# Patient Record
Sex: Female | Born: 1935
Health system: Southern US, Community
[De-identification: ages and names within clinical notes are randomized; demographics above are authoritative.]

## PROBLEM LIST (undated history)

## (undated) DIAGNOSIS — T4145XA Adverse effect of unspecified anesthetic, initial encounter: Secondary | ICD-10-CM

## (undated) DIAGNOSIS — Z9981 Dependence on supplemental oxygen: Secondary | ICD-10-CM

## (undated) DIAGNOSIS — T8859XA Other complications of anesthesia, initial encounter: Secondary | ICD-10-CM

## (undated) DIAGNOSIS — E876 Hypokalemia: Secondary | ICD-10-CM

## (undated) DIAGNOSIS — R748 Abnormal levels of other serum enzymes: Secondary | ICD-10-CM

## (undated) DIAGNOSIS — Z9889 Other specified postprocedural states: Secondary | ICD-10-CM

## (undated) DIAGNOSIS — R112 Nausea with vomiting, unspecified: Secondary | ICD-10-CM

## (undated) DIAGNOSIS — K2270 Barrett's esophagus without dysplasia: Secondary | ICD-10-CM

## (undated) DIAGNOSIS — E785 Hyperlipidemia, unspecified: Secondary | ICD-10-CM

## (undated) DIAGNOSIS — C3490 Malignant neoplasm of unspecified part of unspecified bronchus or lung: Secondary | ICD-10-CM

## (undated) DIAGNOSIS — J449 Chronic obstructive pulmonary disease, unspecified: Secondary | ICD-10-CM

## (undated) DIAGNOSIS — H8110 Benign paroxysmal vertigo, unspecified ear: Secondary | ICD-10-CM

## (undated) DIAGNOSIS — I509 Heart failure, unspecified: Secondary | ICD-10-CM

## (undated) DIAGNOSIS — Z720 Tobacco use: Secondary | ICD-10-CM

## (undated) DIAGNOSIS — I8290 Acute embolism and thrombosis of unspecified vein: Secondary | ICD-10-CM

## (undated) DIAGNOSIS — I4891 Unspecified atrial fibrillation: Secondary | ICD-10-CM

## (undated) HISTORY — DX: Malignant neoplasm of unspecified part of unspecified bronchus or lung: C34.90

## (undated) HISTORY — PX: TIBIA FRACTURE SURGERY: SHX806

## (undated) HISTORY — DX: Benign paroxysmal vertigo, unspecified ear: H81.10

## (undated) HISTORY — PX: REVISION TOTAL HIP ARTHROPLASTY: SHX766

## (undated) HISTORY — DX: Heart failure, unspecified: I50.9

## (undated) HISTORY — DX: Tobacco use: Z72.0

## (undated) HISTORY — DX: Hypokalemia: E87.6

## (undated) HISTORY — DX: Hyperlipidemia, unspecified: E78.5

## (undated) HISTORY — PX: ABDOMINAL HYSTERECTOMY: SHX81

## (undated) HISTORY — DX: Chronic obstructive pulmonary disease, unspecified: J44.9

## (undated) HISTORY — DX: Dependence on supplemental oxygen: Z99.81

## (undated) HISTORY — DX: Barrett's esophagus without dysplasia: K22.70

## (undated) HISTORY — DX: Abnormal levels of other serum enzymes: R74.8

## (undated) HISTORY — PX: HEMORROIDECTOMY: SUR656

## (undated) HISTORY — DX: Acute embolism and thrombosis of unspecified vein: I82.90

## (undated) HISTORY — DX: Unspecified atrial fibrillation: I48.91

## (undated) HISTORY — PX: FOOT SURGERY: SHX648

---

## 1999-01-20 ENCOUNTER — Inpatient Hospital Stay (HOSPITAL_COMMUNITY): Admission: EM | Admit: 1999-01-20 | Discharge: 1999-01-26 | Payer: Self-pay | Admitting: Emergency Medicine

## 1999-01-21 ENCOUNTER — Encounter: Payer: Self-pay | Admitting: Orthopedic Surgery

## 1999-01-25 ENCOUNTER — Encounter: Payer: Self-pay | Admitting: Orthopedic Surgery

## 1999-01-26 ENCOUNTER — Encounter: Payer: Self-pay | Admitting: Orthopedic Surgery

## 1999-04-08 ENCOUNTER — Ambulatory Visit (HOSPITAL_COMMUNITY): Admission: RE | Admit: 1999-04-08 | Discharge: 1999-04-08 | Payer: Self-pay | Admitting: Orthopedic Surgery

## 1999-04-08 ENCOUNTER — Encounter: Payer: Self-pay | Admitting: Orthopedic Surgery

## 1999-04-23 ENCOUNTER — Encounter: Payer: Self-pay | Admitting: Orthopedic Surgery

## 1999-04-23 ENCOUNTER — Encounter: Admission: RE | Admit: 1999-04-23 | Discharge: 1999-04-23 | Payer: Self-pay | Admitting: Orthopedic Surgery

## 1999-04-24 ENCOUNTER — Ambulatory Visit (HOSPITAL_BASED_OUTPATIENT_CLINIC_OR_DEPARTMENT_OTHER): Admission: RE | Admit: 1999-04-24 | Discharge: 1999-04-24 | Payer: Self-pay | Admitting: Orthopedic Surgery

## 1999-10-14 ENCOUNTER — Ambulatory Visit (HOSPITAL_COMMUNITY): Admission: RE | Admit: 1999-10-14 | Discharge: 1999-10-14 | Payer: Self-pay | Admitting: Ophthalmology

## 1999-12-16 ENCOUNTER — Ambulatory Visit (HOSPITAL_COMMUNITY): Admission: RE | Admit: 1999-12-16 | Discharge: 1999-12-16 | Payer: Self-pay | Admitting: Ophthalmology

## 2000-09-15 ENCOUNTER — Encounter: Payer: Self-pay | Admitting: Orthopedic Surgery

## 2000-09-15 ENCOUNTER — Encounter: Admission: RE | Admit: 2000-09-15 | Discharge: 2000-09-15 | Payer: Self-pay | Admitting: Orthopedic Surgery

## 2000-09-29 ENCOUNTER — Encounter: Payer: Self-pay | Admitting: Orthopedic Surgery

## 2000-09-29 ENCOUNTER — Encounter: Admission: RE | Admit: 2000-09-29 | Discharge: 2000-09-29 | Payer: Self-pay | Admitting: Orthopedic Surgery

## 2001-01-27 ENCOUNTER — Encounter: Admission: RE | Admit: 2001-01-27 | Discharge: 2001-01-27 | Payer: Self-pay | Admitting: Neurosurgery

## 2001-01-27 ENCOUNTER — Encounter: Payer: Self-pay | Admitting: Neurosurgery

## 2001-02-16 ENCOUNTER — Encounter: Payer: Self-pay | Admitting: Neurosurgery

## 2001-02-21 ENCOUNTER — Encounter: Payer: Self-pay | Admitting: Neurosurgery

## 2001-02-21 ENCOUNTER — Ambulatory Visit (HOSPITAL_COMMUNITY): Admission: RE | Admit: 2001-02-21 | Discharge: 2001-02-21 | Payer: Self-pay | Admitting: Neurosurgery

## 2001-03-28 ENCOUNTER — Encounter: Payer: Self-pay | Admitting: Neurosurgery

## 2001-03-28 ENCOUNTER — Ambulatory Visit (HOSPITAL_COMMUNITY): Admission: RE | Admit: 2001-03-28 | Discharge: 2001-03-28 | Payer: Self-pay | Admitting: Neurosurgery

## 2001-03-28 ENCOUNTER — Other Ambulatory Visit: Admission: RE | Admit: 2001-03-28 | Discharge: 2001-03-28 | Payer: Self-pay | Admitting: Otolaryngology

## 2001-06-27 ENCOUNTER — Ambulatory Visit (HOSPITAL_COMMUNITY): Admission: RE | Admit: 2001-06-27 | Discharge: 2001-06-27 | Payer: Self-pay | Admitting: Neurosurgery

## 2001-06-27 ENCOUNTER — Encounter: Payer: Self-pay | Admitting: Neurosurgery

## 2001-10-06 ENCOUNTER — Encounter: Payer: Self-pay | Admitting: Neurosurgery

## 2001-10-06 ENCOUNTER — Encounter: Admission: RE | Admit: 2001-10-06 | Discharge: 2001-10-06 | Payer: Self-pay | Admitting: Neurosurgery

## 2001-12-22 ENCOUNTER — Encounter: Admission: RE | Admit: 2001-12-22 | Discharge: 2001-12-22 | Payer: Self-pay | Admitting: Orthopedic Surgery

## 2001-12-22 ENCOUNTER — Encounter: Payer: Self-pay | Admitting: Orthopedic Surgery

## 2002-01-06 ENCOUNTER — Encounter: Admission: RE | Admit: 2002-01-06 | Discharge: 2002-01-06 | Payer: Self-pay | Admitting: Orthopedic Surgery

## 2002-01-06 ENCOUNTER — Encounter: Payer: Self-pay | Admitting: Orthopedic Surgery

## 2002-01-20 ENCOUNTER — Encounter: Admission: RE | Admit: 2002-01-20 | Discharge: 2002-01-20 | Payer: Self-pay | Admitting: Orthopedic Surgery

## 2002-02-03 ENCOUNTER — Inpatient Hospital Stay (HOSPITAL_COMMUNITY): Admission: RE | Admit: 2002-02-03 | Discharge: 2002-02-07 | Payer: Self-pay | Admitting: Internal Medicine

## 2002-06-13 ENCOUNTER — Encounter: Payer: Self-pay | Admitting: Orthopedic Surgery

## 2002-06-13 ENCOUNTER — Encounter: Admission: RE | Admit: 2002-06-13 | Discharge: 2002-06-13 | Payer: Self-pay | Admitting: Orthopedic Surgery

## 2004-10-07 ENCOUNTER — Encounter: Admission: RE | Admit: 2004-10-07 | Discharge: 2004-10-07 | Payer: Self-pay | Admitting: Orthopedic Surgery

## 2004-10-09 ENCOUNTER — Ambulatory Visit (HOSPITAL_BASED_OUTPATIENT_CLINIC_OR_DEPARTMENT_OTHER): Admission: RE | Admit: 2004-10-09 | Discharge: 2004-10-09 | Payer: Self-pay | Admitting: Orthopedic Surgery

## 2004-10-09 ENCOUNTER — Ambulatory Visit (HOSPITAL_COMMUNITY): Admission: RE | Admit: 2004-10-09 | Discharge: 2004-10-09 | Payer: Self-pay | Admitting: Orthopedic Surgery

## 2005-11-22 ENCOUNTER — Emergency Department (HOSPITAL_COMMUNITY): Admission: EM | Admit: 2005-11-22 | Discharge: 2005-11-22 | Payer: Self-pay | Admitting: Emergency Medicine

## 2005-12-24 ENCOUNTER — Ambulatory Visit (HOSPITAL_COMMUNITY): Admission: RE | Admit: 2005-12-24 | Discharge: 2005-12-24 | Payer: Self-pay | Admitting: Specialist

## 2006-02-15 ENCOUNTER — Ambulatory Visit (HOSPITAL_COMMUNITY): Admission: RE | Admit: 2006-02-15 | Discharge: 2006-02-15 | Payer: Self-pay | Admitting: Thoracic Surgery

## 2006-02-15 ENCOUNTER — Encounter (INDEPENDENT_AMBULATORY_CARE_PROVIDER_SITE_OTHER): Payer: Self-pay | Admitting: Specialist

## 2006-02-22 ENCOUNTER — Inpatient Hospital Stay (HOSPITAL_COMMUNITY): Admission: RE | Admit: 2006-02-22 | Discharge: 2006-02-27 | Payer: Self-pay | Admitting: Thoracic Surgery

## 2006-02-22 ENCOUNTER — Encounter (INDEPENDENT_AMBULATORY_CARE_PROVIDER_SITE_OTHER): Payer: Self-pay | Admitting: Specialist

## 2006-02-23 HISTORY — PX: THORACOTOMY: SUR1349

## 2006-02-23 HISTORY — PX: LUNG LOBECTOMY: SHX167

## 2006-03-03 ENCOUNTER — Encounter: Admission: RE | Admit: 2006-03-03 | Discharge: 2006-03-03 | Payer: Self-pay | Admitting: Thoracic Surgery

## 2006-03-18 ENCOUNTER — Encounter: Admission: RE | Admit: 2006-03-18 | Discharge: 2006-03-18 | Payer: Self-pay | Admitting: Thoracic Surgery

## 2006-03-25 ENCOUNTER — Encounter: Admission: RE | Admit: 2006-03-25 | Discharge: 2006-03-25 | Payer: Self-pay | Admitting: Thoracic Surgery

## 2006-04-07 ENCOUNTER — Encounter: Admission: RE | Admit: 2006-04-07 | Discharge: 2006-04-07 | Payer: Self-pay | Admitting: Thoracic Surgery

## 2006-05-19 ENCOUNTER — Encounter: Admission: RE | Admit: 2006-05-19 | Discharge: 2006-05-19 | Payer: Self-pay | Admitting: Thoracic Surgery

## 2006-05-19 ENCOUNTER — Ambulatory Visit: Payer: Self-pay | Admitting: Thoracic Surgery

## 2006-07-14 ENCOUNTER — Inpatient Hospital Stay (HOSPITAL_COMMUNITY): Admission: AD | Admit: 2006-07-14 | Discharge: 2006-07-21 | Payer: Self-pay | Admitting: Orthopedic Surgery

## 2006-08-18 ENCOUNTER — Encounter: Admission: RE | Admit: 2006-08-18 | Discharge: 2006-08-18 | Payer: Self-pay | Admitting: Thoracic Surgery

## 2006-08-18 ENCOUNTER — Ambulatory Visit: Payer: Self-pay | Admitting: Thoracic Surgery

## 2006-10-16 ENCOUNTER — Encounter: Admission: RE | Admit: 2006-10-16 | Discharge: 2006-10-16 | Payer: Self-pay | Admitting: Neurosurgery

## 2006-11-25 ENCOUNTER — Encounter: Admission: RE | Admit: 2006-11-25 | Discharge: 2006-11-25 | Payer: Self-pay | Admitting: Thoracic Surgery

## 2006-11-25 ENCOUNTER — Ambulatory Visit: Payer: Self-pay | Admitting: Thoracic Surgery

## 2006-12-09 ENCOUNTER — Encounter: Admission: RE | Admit: 2006-12-09 | Discharge: 2006-12-09 | Payer: Self-pay | Admitting: Neurosurgery

## 2007-03-08 ENCOUNTER — Ambulatory Visit: Payer: Self-pay | Admitting: Vascular Surgery

## 2007-05-11 ENCOUNTER — Ambulatory Visit: Payer: Self-pay | Admitting: Thoracic Surgery

## 2007-05-11 ENCOUNTER — Encounter: Admission: RE | Admit: 2007-05-11 | Discharge: 2007-05-11 | Payer: Self-pay | Admitting: Thoracic Surgery

## 2007-09-09 IMAGING — CR DG CHEST 2V
2 series · 2 of 2 positions shown · non-contrast
Comparison: 03/25/06.

CLINICAL DATA: Status post lung surgery. 
TWO VIEW CHEST:

[w chest pa]
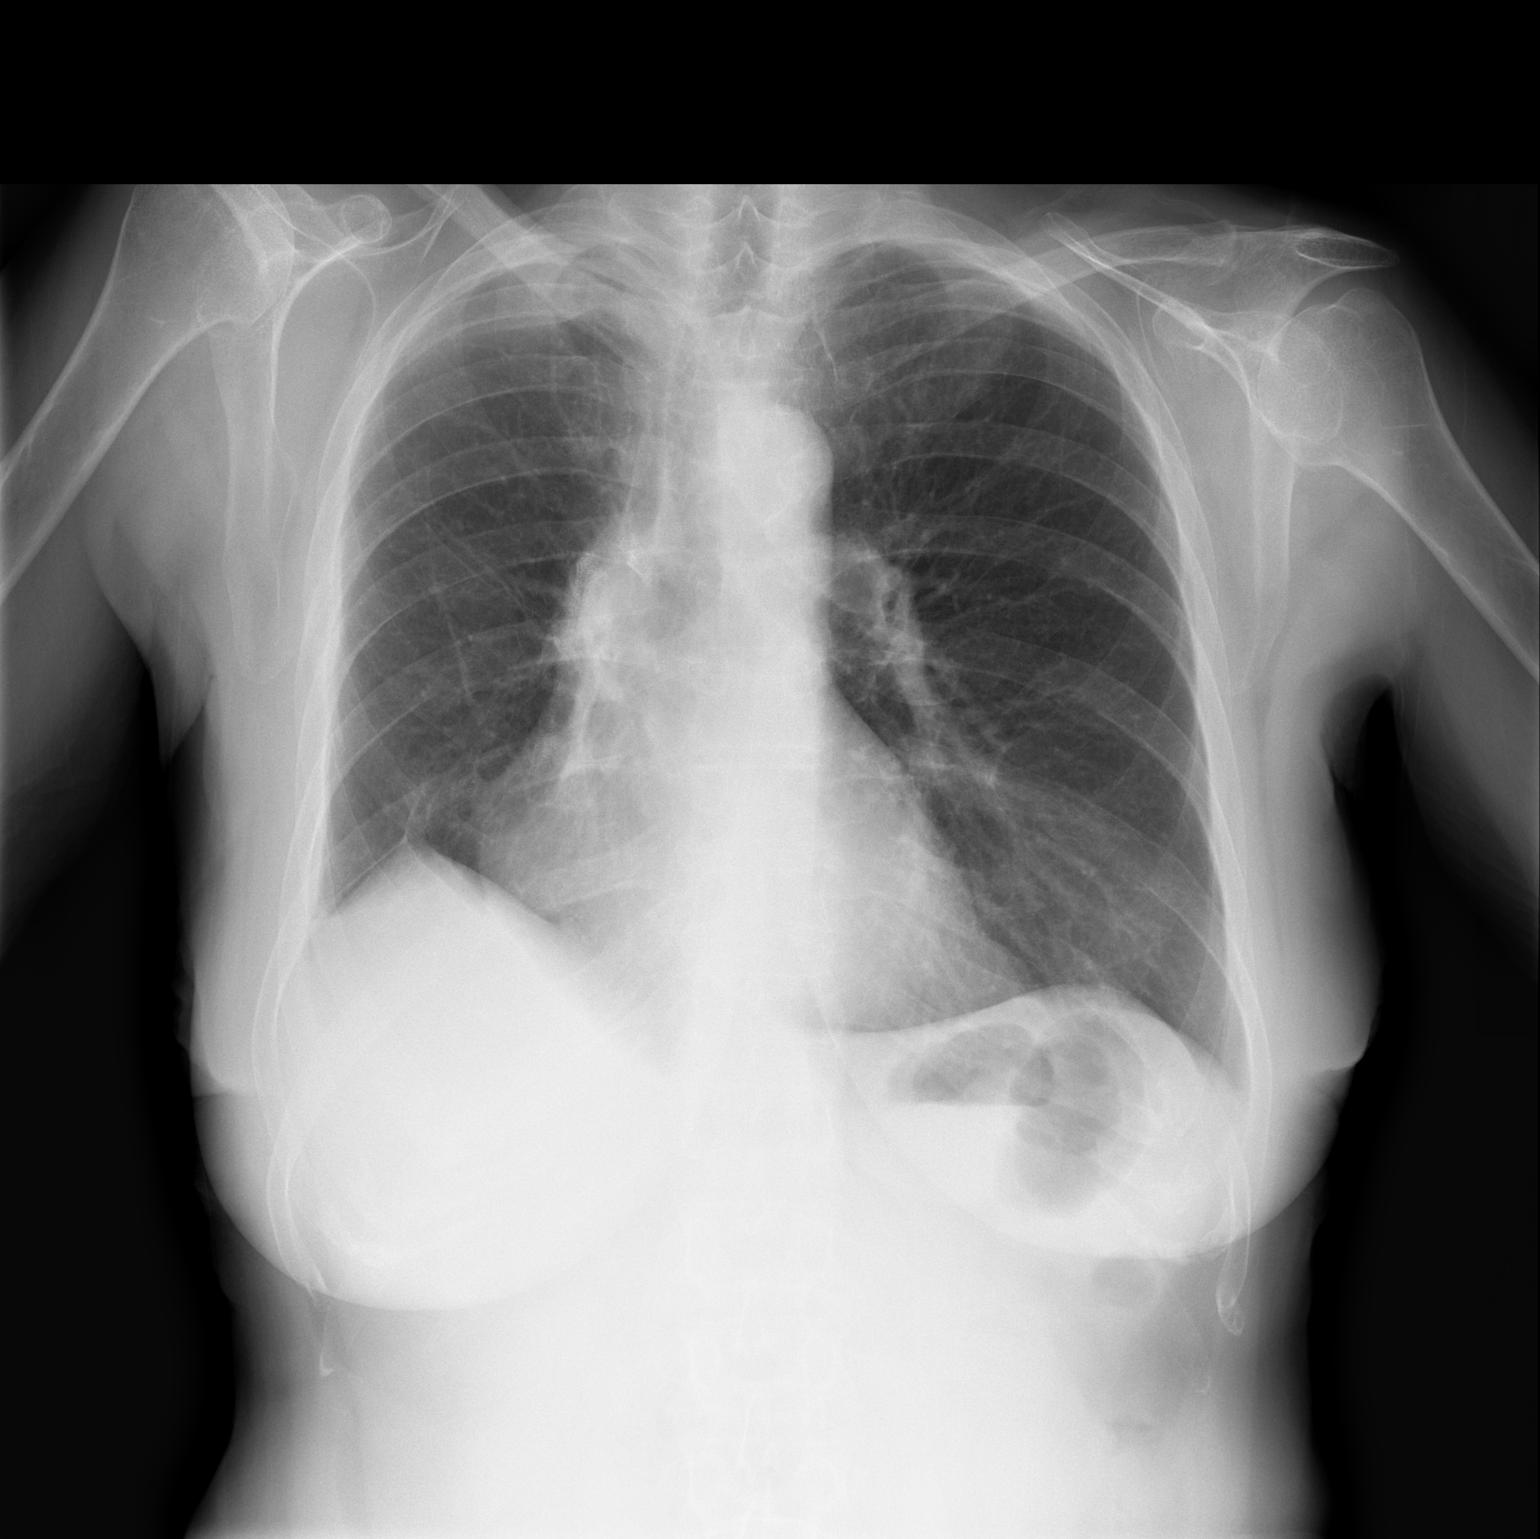

[w chest lat]
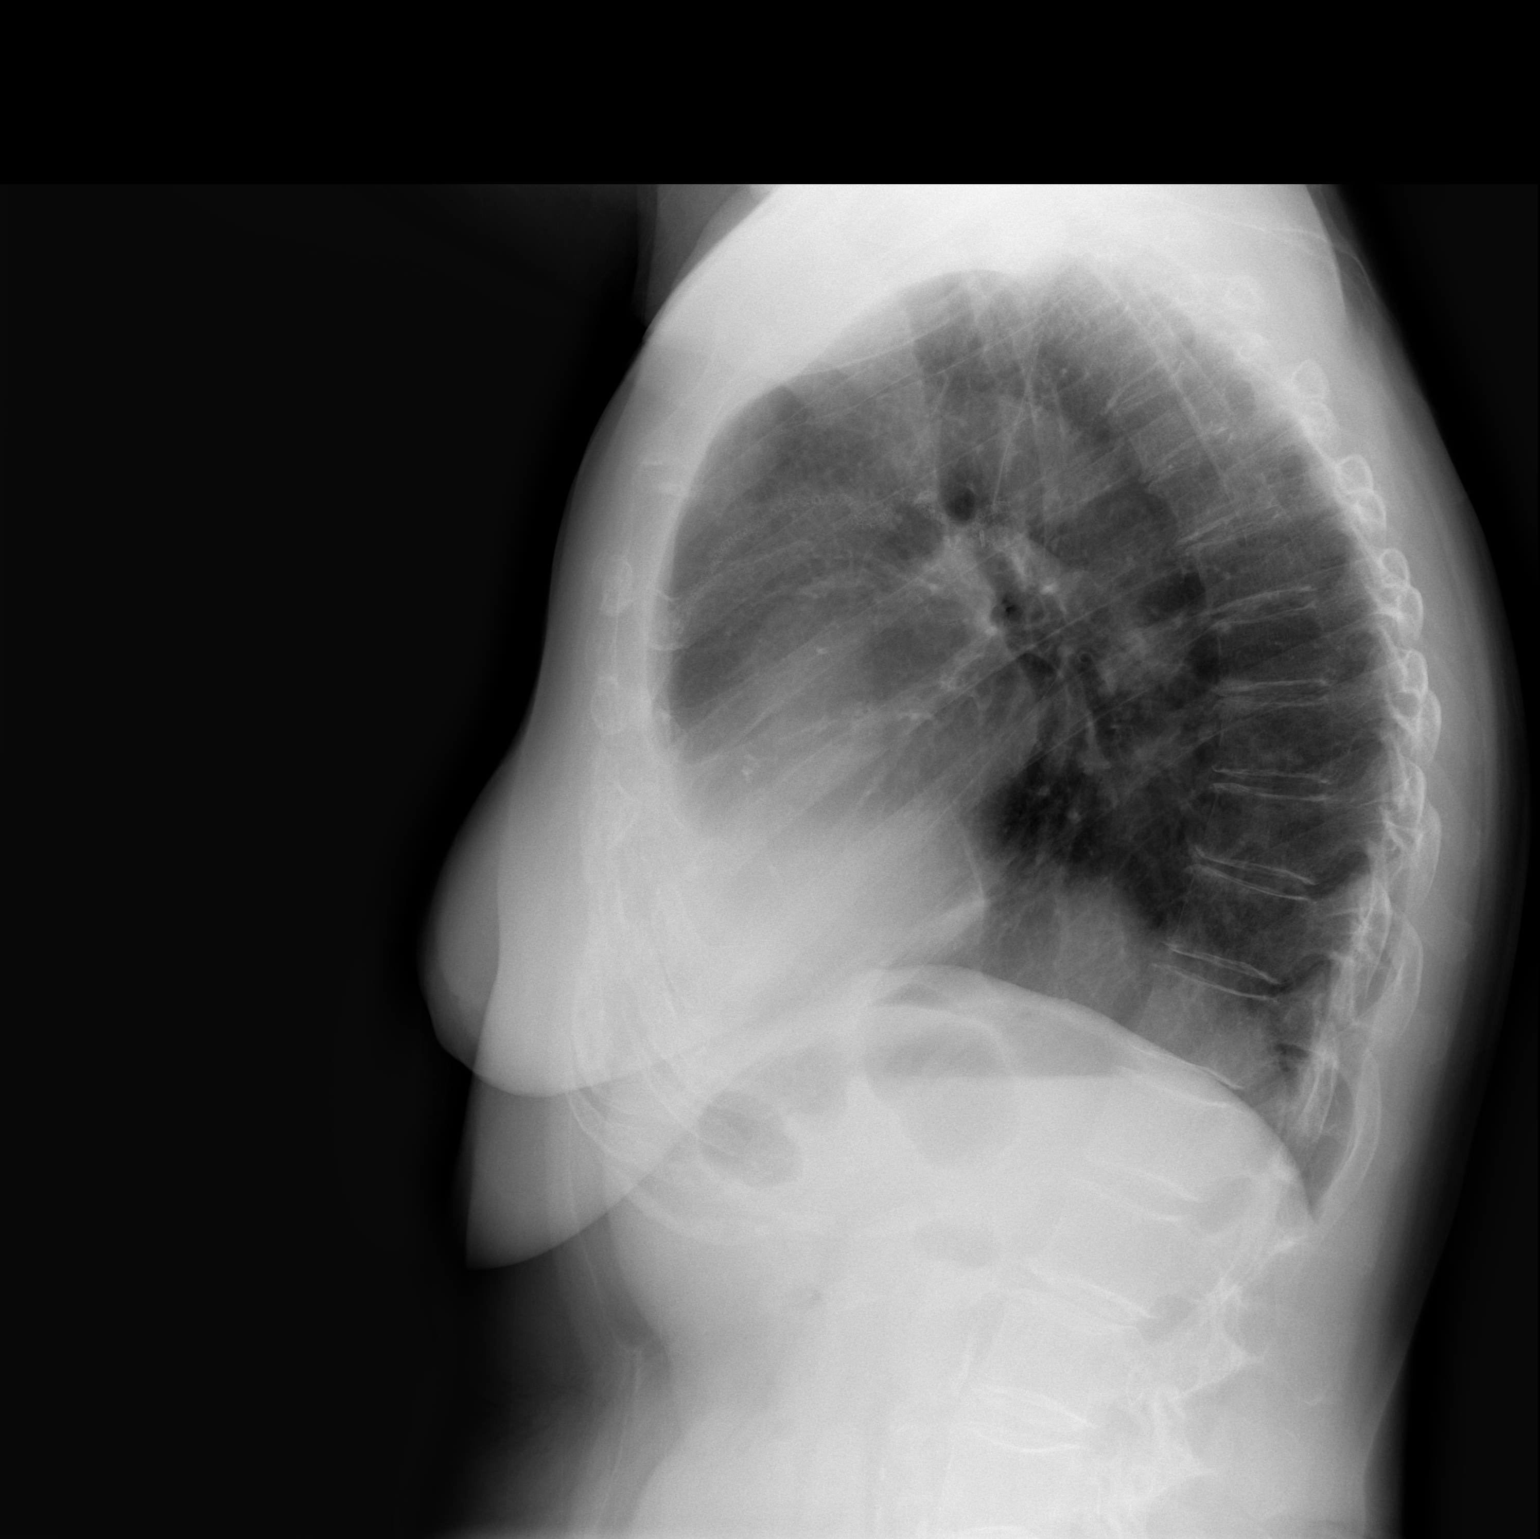

[2 of 2 positions shown; findings below may reference images not displayed]

FINDINGS: Trachea is midline.  Heart size is stable.  Post operative changes and volume loss are seen in the right hemithorax.  No definite pleural air.  Left lung clear.
IMPRESSION: Post operative changes in the right hemithorax without definite pleural air.

## 2007-11-02 ENCOUNTER — Ambulatory Visit: Payer: Self-pay | Admitting: Thoracic Surgery

## 2007-11-02 ENCOUNTER — Encounter: Admission: RE | Admit: 2007-11-02 | Discharge: 2007-11-02 | Payer: Self-pay | Admitting: Thoracic Surgery

## 2007-12-06 ENCOUNTER — Ambulatory Visit: Payer: Self-pay | Admitting: Vascular Surgery

## 2007-12-16 IMAGING — CR DG CHEST 2V
1 series · 1 of 1 positions shown · non-contrast
Comparison: 05/19/06.

CLINICAL DATA: Hip fracture.  
 CHEST - 2 VIEW:

[w chest lat]
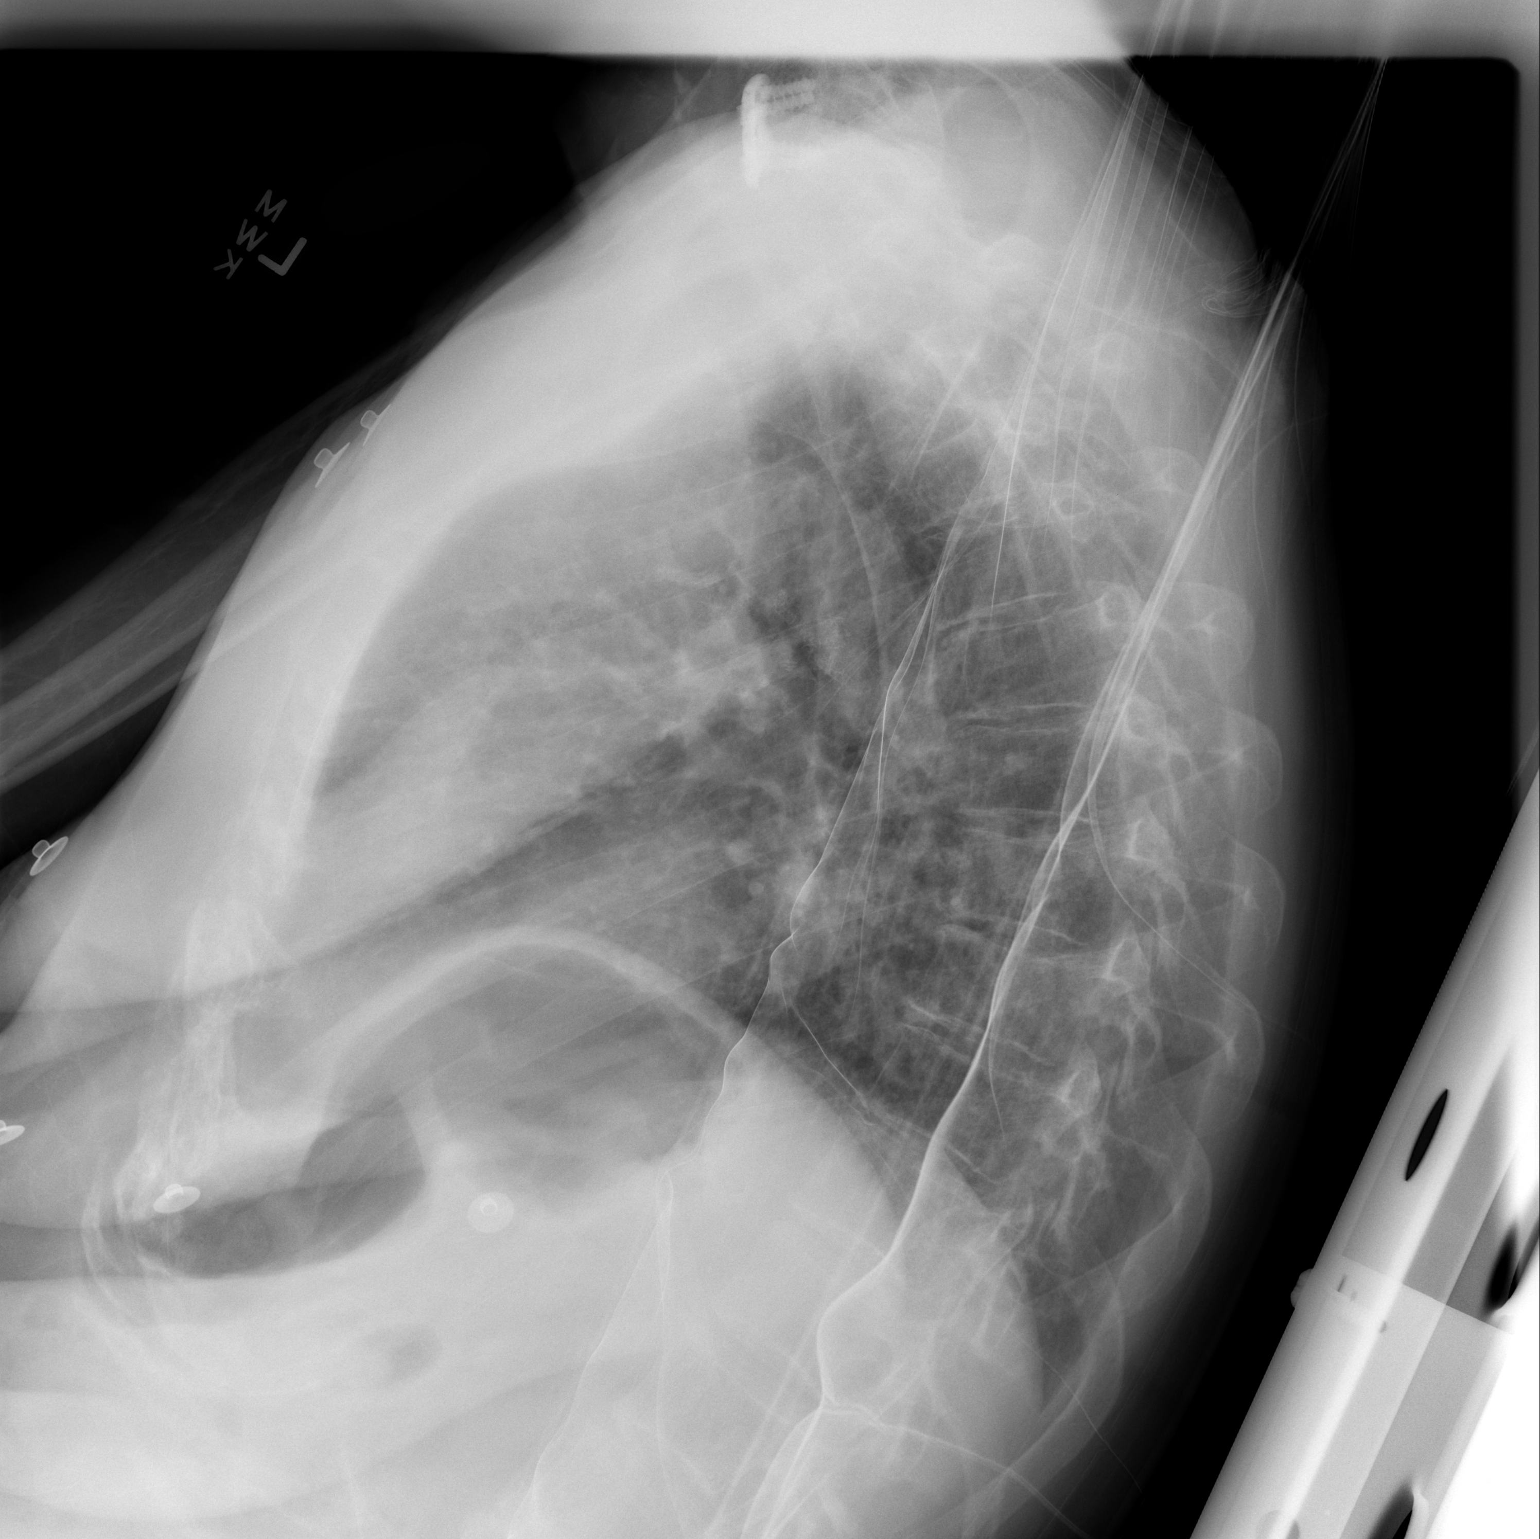

[1 of 1 positions shown; findings below may reference images not displayed]

FINDINGS: The heart size is stable.  The patient is rotated.  The questioned superimposition of shadows in the right mid lung zone is obscured on today?s study.  Left lung clear.
IMPRESSION: Please see above.

## 2008-05-02 ENCOUNTER — Ambulatory Visit: Payer: Self-pay | Admitting: Thoracic Surgery

## 2008-05-02 ENCOUNTER — Encounter: Admission: RE | Admit: 2008-05-02 | Discharge: 2008-05-02 | Payer: Self-pay | Admitting: Thoracic Surgery

## 2008-06-11 ENCOUNTER — Ambulatory Visit: Payer: Self-pay | Admitting: Vascular Surgery

## 2008-11-14 ENCOUNTER — Encounter: Admission: RE | Admit: 2008-11-14 | Discharge: 2008-11-14 | Payer: Self-pay | Admitting: Thoracic Surgery

## 2008-11-14 ENCOUNTER — Ambulatory Visit: Payer: Self-pay | Admitting: Thoracic Surgery

## 2008-12-27 ENCOUNTER — Encounter: Payer: Self-pay | Admitting: Gastroenterology

## 2008-12-31 ENCOUNTER — Ambulatory Visit: Payer: Self-pay | Admitting: Vascular Surgery

## 2009-01-07 ENCOUNTER — Encounter: Admission: RE | Admit: 2009-01-07 | Discharge: 2009-01-07 | Payer: Self-pay | Admitting: Orthopedic Surgery

## 2009-02-20 ENCOUNTER — Encounter: Payer: Self-pay | Admitting: Gastroenterology

## 2009-03-20 ENCOUNTER — Encounter: Payer: Self-pay | Admitting: Gastroenterology

## 2009-04-03 ENCOUNTER — Encounter: Payer: Self-pay | Admitting: Gastroenterology

## 2009-05-21 ENCOUNTER — Ambulatory Visit: Payer: Self-pay | Admitting: Thoracic Surgery

## 2009-05-21 ENCOUNTER — Encounter: Admission: RE | Admit: 2009-05-21 | Discharge: 2009-05-21 | Payer: Self-pay | Admitting: Thoracic Surgery

## 2009-08-03 ENCOUNTER — Encounter: Payer: Self-pay | Admitting: Gastroenterology

## 2009-08-05 ENCOUNTER — Encounter: Payer: Self-pay | Admitting: Gastroenterology

## 2009-08-07 ENCOUNTER — Encounter: Payer: Self-pay | Admitting: Gastroenterology

## 2009-08-20 ENCOUNTER — Encounter (INDEPENDENT_AMBULATORY_CARE_PROVIDER_SITE_OTHER): Payer: Self-pay | Admitting: *Deleted

## 2009-08-20 ENCOUNTER — Telehealth (INDEPENDENT_AMBULATORY_CARE_PROVIDER_SITE_OTHER): Payer: Self-pay | Admitting: *Deleted

## 2009-08-20 DIAGNOSIS — R933 Abnormal findings on diagnostic imaging of other parts of digestive tract: Secondary | ICD-10-CM | POA: Insufficient documentation

## 2009-09-11 ENCOUNTER — Telehealth (INDEPENDENT_AMBULATORY_CARE_PROVIDER_SITE_OTHER): Payer: Self-pay | Admitting: *Deleted

## 2009-09-11 ENCOUNTER — Emergency Department (HOSPITAL_COMMUNITY): Admission: EM | Admit: 2009-09-11 | Discharge: 2009-09-11 | Payer: Self-pay | Admitting: Emergency Medicine

## 2009-09-12 ENCOUNTER — Ambulatory Visit (HOSPITAL_COMMUNITY): Admission: RE | Admit: 2009-09-12 | Discharge: 2009-09-12 | Payer: Self-pay | Admitting: Gastroenterology

## 2009-09-12 ENCOUNTER — Ambulatory Visit: Payer: Self-pay | Admitting: Gastroenterology

## 2009-11-21 ENCOUNTER — Encounter: Admission: RE | Admit: 2009-11-21 | Discharge: 2009-11-21 | Payer: Self-pay | Admitting: Podiatry

## 2009-11-26 ENCOUNTER — Ambulatory Visit: Payer: Self-pay | Admitting: Thoracic Surgery

## 2009-11-26 ENCOUNTER — Encounter: Admission: RE | Admit: 2009-11-26 | Discharge: 2009-11-26 | Payer: Self-pay | Admitting: Thoracic Surgery

## 2010-03-18 ENCOUNTER — Ambulatory Visit: Admit: 2010-03-18 | Payer: Self-pay | Admitting: Vascular Surgery

## 2010-04-15 NOTE — Letter (Signed)
Summary: [facility]  [facility]   Imported By: Lester [place] 08/28/2009 10:14:17  _____________________________________________________________________  External Attachment:    Type:   Image     Comment:   External Document

## 2010-04-15 NOTE — Procedures (Signed)
Summary: EGD/[facility]  EGD/[facility]   Imported By: Lester [place] 08/28/2009 10:15:31  _____________________________________________________________________  External Attachment:    Type:   Image     Comment:   External Document

## 2010-04-15 NOTE — Letter (Signed)
Summary: EGD Instructions  [facility]  [address] [place], [place] [address]   Phone: [phone]  Fax: [phone]       Courtney Grant    27-Oct-1932    MRN: [id number]       Procedure Day /Date:09/12/09 Courtney Grant       Arrival Time:630 am     Procedure Time:830 am     Location of Procedure:                     X [facility] ( Outpatient Registration)    PREPARATION FOR ENDOSCOPY   On 09/12/09  THE DAY OF THE PROCEDURE:  1.   Nothing to eat or drink after midnight.                                                                                                  MEDICATION INSTRUCTIONS  Unless otherwise instructed, you should take regular prescription medications with a small sip of water as early as possible the morning of your procedure.               OTHER INSTRUCTIONS  You will need a responsible adult at least 75 years of age to accompany you and drive you home.   This person must remain in the waiting room during your procedure.  Wear loose fitting clothing that is easily removed.  Leave jewelry and other valuables at home.  However, you may wish to bring a book to read or an iPod/MP3 player to listen to music as you wait for your procedure to start.  Remove all body piercing jewelry and leave at home.  Total time from sign-in until discharge is approximately 2-3 hours.  You should go home directly after your procedure and rest.  You can resume normal activities the day after your procedure.  The day of your procedure you should not:   Drive   Make legal decisions   Operate machinery   Drink alcohol   Return to work  You will receive specific instructions about eating, activities and medications before you leave.    The above instructions have been reviewed and explained to me by   Chales Abrahams CMA (AAMA)  August 20, 2009 1:40 PM     I fully understand and can verbalize these instructions over the phone mailed to home  Date 08/20/09

## 2010-04-15 NOTE — Procedures (Signed)
Summary: EGD/[facility]  EGD/[facility]   Imported By: Lester [place] 08/28/2009 10:06:49  _____________________________________________________________________  External Attachment:    Type:   Image     Comment:   External Document

## 2010-04-15 NOTE — Progress Notes (Signed)
Summary: EUS  Phone Note Outgoing Call Call back at [facility] Phone [phone]   Call placed by: Chales Abrahams CMA Duncan Dull),  August 20, 2009 1:34 PM Summary of Call: pt scheduled for EUS need to review meds and instruct pt. Initial call taken by: Chales Abrahams CMA Duncan Dull),  August 20, 2009 1:37 PM  Follow-up for Phone Call        spoke with the pt she has been instructed and meds reviewed.  She will call with any questions Follow-up by: Chales Abrahams CMA Duncan Dull),  August 20, 2009 3:42 PM  New Problems: NONSPECIFIC ABN FINDING RAD & OTH EXAM GI TRACT (ICD-793.4)   New Problems: NONSPECIFIC ABN FINDING RAD & OTH EXAM GI TRACT (ICD-793.4)

## 2010-04-15 NOTE — Progress Notes (Signed)
Summary: FYI:  ER and EUS for 09/12/09  Phone Note Other Incoming   Caller: Lanora Manis from [facility] endo Summary of Call: Lanora Manis is calling because the pt came in today for her pre appt with anesthesia for her EUS tomorrow and was in extreme abd pain.  Lanora Manis took her to the ER.  Not sure if she will be admitted. Initial call taken by: Chales Abrahams CMA Duncan Dull),  September 11, 2009 2:45 PM  Follow-up for Phone Call        ok Follow-up by: Rachael Fee MD,  September 12, 2009 7:19 AM

## 2010-04-15 NOTE — Procedures (Signed)
Summary: Endoscopic Ultrasound  Patient: Courtney Grant Note: All result statuses are Final unless otherwise noted.  Tests: (1) Endoscopic Ultrasound (EUS)  EUS Endoscopic Ultrasound                             DONE     [facility]     [address] [place], [place]  [address]           ENDOSCOPIC ULTRASOUND PROCEDURE REPORT           PATIENT:  Courtney Grant, Courtney Grant  MR#:  [id number]     BIRTHDATE:  [birth year]  GENDER:  female     ENDOSCOPIST:  Rachael Fee, MD     REFERRED BY:  Webb Silversmith, M.D.     PROCEDURE DATE:  09/12/2009     PROCEDURE:  Upper EUS     ASA CLASS:  Class II     INDICATIONS:  abdominal pain; slightly dilated CBD and main     pancreatic duct on recent CT scan, also ?soft tissue mass at     ampulla; normal CBC, normal lfts; recent EGD by Dr. Charm Barges was     normal; lung cancer resected 3-4 years ago (still smokes)     MEDICATIONS:   MAC sedation, administered by CRNA           DESCRIPTION OF PROCEDURE:   After the risks, benefits, and     alternatives of the procedure were thoroughly explained, informed     consent was obtained.  The  endoscope was introduced through the     mouth and advanced to the duodenum.           <<PROCEDUREIMAGES>>           Endoscopic findings:     1. Normal esophagus     2. Medium to large amount of solid food in stomach without     anatomic gastric outlet obstruction     3. Normal duodenum (periampullary mucosa was normal, visualized     with duodenoscope)           EUS findings:     1. CBD was slightly dilated (up to 8-57mm proximally), but tapered     smoothly into head of pancreas and contained no stones.     2. Main pancreatic duct was slightly dilated (up to 4-40mm in neck     of gland) and somewhat ectatic.     3. No pancreatic masses or signs of chronic pancreatitis     4. No peripancreatic adenopathy.     5. Normal gallbladder.     6. Limited views of liver, spleen, portal and splenic vessels were     all  normal           Impression:     Slightly dilated CBD, main pancreatic duct without pancreatic     masses or signs of chronic pancreatitis.  The clinical relevence     of these findings is not clear but given normal CBC, normal LFTs     it is unlikely the dilated ducts are causing symptoms.  She did     have moderate to large amount of retained food in stomach,     probably she has underlying gastroparesis.   She was evaluated by     one of my partners in ER yesterday afternoon (was here as     pre-visit for this procedure and complained  of severe abdominal     pain, sent to ER) CBC, cmet, amylase, lipase were all normal. She     was given 15 percocet.  She asked for more pain meds today prior     to this procedure which I am not comfortable giving to her since I     have no explanation for her abdominal pains.  She was instructed     to call her PCP or Dr. Charm Barges (was considering workup for     intestinal ischemia).           ______________________________     Rachael Fee, MD           n.     eSIGNED:   Rachael Fee at 09/12/2009 09:09 AM           Elliot Dally, [id number]  Note: An exclamation mark (!) indicates a result that was not dispersed into the flowsheet. Document Creation Date: 09/12/2009 9:10 AM _______________________________________________________________________  (1) Order result status: Final Collection or observation date-time: 09/12/2009 08:57 Requested date-time:  Receipt date-time:  Reported date-time:  Referring Physician:   Ordering Physician: Rob Bunting [phone]) Specimen Source:  Source: Launa Grill Order Number: [id number] Lab site:

## 2010-04-15 NOTE — Letter (Signed)
Summary: [facility]  [facility]   Imported By: Lester [place] 08/28/2009 10:16:56  _____________________________________________________________________  External Attachment:    Type:   Image     Comment:   External Document

## 2010-05-08 ENCOUNTER — Other Ambulatory Visit: Payer: Self-pay | Admitting: Thoracic Surgery

## 2010-05-08 DIAGNOSIS — R911 Solitary pulmonary nodule: Secondary | ICD-10-CM

## 2010-05-26 ENCOUNTER — Other Ambulatory Visit: Payer: Self-pay | Admitting: Orthopedic Surgery

## 2010-05-26 DIAGNOSIS — R52 Pain, unspecified: Secondary | ICD-10-CM

## 2010-05-26 DIAGNOSIS — R531 Weakness: Secondary | ICD-10-CM

## 2010-05-30 ENCOUNTER — Other Ambulatory Visit: Payer: Self-pay

## 2010-06-02 LAB — DIGOXIN LEVEL: Digoxin Level: 1 ng/mL (ref 0.8–2.0)

## 2010-06-02 LAB — DIFFERENTIAL
Basophils Absolute: 0 10*3/uL (ref 0.0–0.1)
Basophils Relative: 0 % (ref 0–1)
Eosinophils Absolute: 0.2 10*3/uL (ref 0.0–0.7)
Eosinophils Relative: 2 % (ref 0–5)
Lymphocytes Relative: 28 % (ref 12–46)
Lymphs Abs: 2.7 10*3/uL (ref 0.7–4.0)
Monocytes Absolute: 0.7 10*3/uL (ref 0.1–1.0)
Monocytes Relative: 7 % (ref 3–12)
Neutro Abs: 6.1 10*3/uL (ref 1.7–7.7)
Neutrophils Relative %: 63 % (ref 43–77)

## 2010-06-02 LAB — CBC
HCT: 39.7 % (ref 36.0–46.0)
Hemoglobin: 13.6 g/dL (ref 12.0–15.0)
MCH: 33.1 pg (ref 26.0–34.0)
MCHC: 34.2 g/dL (ref 30.0–36.0)
MCV: 97 fL (ref 78.0–100.0)
Platelets: 286 10*3/uL (ref 150–400)
RBC: 4.1 MIL/uL (ref 3.87–5.11)
RDW: 13.2 % (ref 11.5–15.5)
WBC: 9.7 10*3/uL (ref 4.0–10.5)

## 2010-06-02 LAB — COMPREHENSIVE METABOLIC PANEL
ALT: 13 U/L (ref 0–35)
AST: 23 U/L (ref 0–37)
Albumin: 3.7 g/dL (ref 3.5–5.2)
Alkaline Phosphatase: 80 U/L (ref 39–117)
BUN: 16 mg/dL (ref 6–23)
CO2: 27 mEq/L (ref 19–32)
Calcium: 9.1 mg/dL (ref 8.4–10.5)
Chloride: 100 mEq/L (ref 96–112)
Creatinine, Ser: 0.88 mg/dL (ref 0.4–1.2)
GFR calc non Af Amer: >60 mL/min (ref >60)
Glucose, Bld: 98 mg/dL (ref 70–99)
Potassium: 3.5 mEq/L (ref 3.5–5.1)
Sodium: 137 mEq/L (ref 135–145)
Total Bilirubin: 0.2 mg/dL — ABNORMAL LOW (ref 0.3–1.2)
Total Protein: 7.3 g/dL (ref 6.0–8.3)

## 2010-06-02 LAB — LIPASE, BLOOD: Lipase: 39 U/L (ref 11–59)

## 2010-06-02 LAB — POCT CARDIAC MARKERS
CKMB, poc: 1.7 ng/mL (ref 1.0–8.0)
Myoglobin, poc: 54.1 ng/mL (ref 12–200)
Troponin i, poc: <0.05 ng/mL (ref 0.00–0.09)

## 2010-06-02 LAB — D-DIMER, QUANTITATIVE

## 2010-06-03 ENCOUNTER — Ambulatory Visit: Payer: Self-pay | Admitting: Thoracic Surgery

## 2010-06-03 ENCOUNTER — Ambulatory Visit: Payer: Medicare Other | Admitting: Thoracic Surgery

## 2010-06-03 ENCOUNTER — Ambulatory Visit
Admission: RE | Admit: 2010-06-03 | Discharge: 2010-06-03 | Disposition: A | Discharge status: Home / Self Care | Payer: Medicare Other | Source: Ambulatory Visit | Attending: Thoracic Surgery | Admitting: Thoracic Surgery

## 2010-06-03 DIAGNOSIS — R911 Solitary pulmonary nodule: Secondary | ICD-10-CM

## 2010-06-06 ENCOUNTER — Ambulatory Visit (INDEPENDENT_AMBULATORY_CARE_PROVIDER_SITE_OTHER): Payer: Medicare Other | Admitting: Thoracic Surgery

## 2010-06-06 DIAGNOSIS — C3490 Malignant neoplasm of unspecified part of unspecified bronchus or lung: Secondary | ICD-10-CM

## 2010-06-07 NOTE — Assessment & Plan Note (Signed)
OFFICE VISIT  MORGANNA, STYLES DOB:  11-10-1932                                        June 06, 2010 CHART #:  [id number]  The patient returned today and her CT scan showed no evidence of recurrence.  She is 4-1/2 years since her surgery.  Blood pressure is 113/68, pulse 96, respirations 20, sats were 98%.  If she is still having lot of chronic back and hip pain, will see Dr. Wynetta Emery for this. She wanted some Percocet, we told her to check with her medical doctor. I will see her back again in 6 months with a CT scan for final check.  Ines Bloomer, M.D. Electronically Signed  DPB/MEDQ  D:  06/06/2010  T:  06/07/2010  Job:  [id number]

## 2010-07-29 NOTE — Procedures (Signed)
RENAL ARTERY DUPLEX EVALUATION   INDICATION:  Followup left renal artery stenosis per CT.   HISTORY:  Diabetes:  No.  Cardiac:  Irregular heartbeat.  Hypertension:  No.  Smoking:  Yes.   RENAL ARTERY DUPLEX FINDINGS:  Aorta-Proximal:  76 cm/s  Aorta-Mid:  95 cm/s  Aorta-Distal:  108 cm/s  Celiac Artery Origin:  110 cm/s  SMA Origin:  121 cm/s                                    RIGHT               LEFT  Renal Artery Origin:                                 279 cm/s  Renal Artery Proximal:                               258 cm/s  Renal Artery Mid:                                    155 cm/s  Renal Artery Distal:                                 111 cm/s  Hilar Acceleration Time (AT):  Renal-Aortic Ratio (RAR):                            3.6  Kidney Size:                                         10.1 cm X  cm  End Diastolic Ratio (EDR):  Resistive Index (RI):                                0.64   IMPRESSION:  1. Limited study.  2. > 60% stenosis noted in the left renal artery.  Left kidney      measured 10.1 cm.  3. Resistive index appears within normal limits.   ___________________________________________  Quita Skye Hart [place], M.D.   MG/MEDQ  D:  03/08/2007  T:  03/09/2007  Job:  [id number]

## 2010-07-29 NOTE — Assessment & Plan Note (Signed)
OFFICE VISIT   Courtney Grant, Courtney Grant  DOB:  05-31-1932                                        November 26, 2009  CHART #:  [id number]   The patient returned today.  Her blood pressure is 134/67, pulse 72,  respirations 18, sats are 97%.  She is having lot of trouble with her  bones and was unable to tolerate Fosamax.  She is having a lot of pain,  particularly in her left foot where she had a stress fracture.  Chest x-  ray showed no evidence of recurrence.  She is still smoking, was  cautioned not to do this, but otherwise no evidence of recurrence of  cancer.  We will see her back again in 6 months with a CT scan.   Ines Bloomer, M.D.  Electronically Signed   DPB/MEDQ  D:  11/26/2009  T:  11/27/2009  Job:  [id number]

## 2010-07-29 NOTE — Assessment & Plan Note (Signed)
OFFICE VISIT   Courtney Grant, Courtney Grant  DOB:  [birth year]                                        November 02, 2007  CHART #:  [id number]   The patient came for followup today.  She was doing reasonably well.  Her blood pressure was 115/63, pulse 64, respirations 18, and sats were  98%.  Chest x-ray did show normal postoperative changes.  I will plan to  see her back again in 6 months to repeat his CT scan at that time.   Ines Bloomer, M.D.  Electronically Signed   DPB/MEDQ  D:  11/02/2007  T:  11/02/2007  Job:  [id number]

## 2010-07-29 NOTE — Assessment & Plan Note (Signed)
OFFICE VISIT   [name], Courtney Grant  DOB:  Dec 01, 1932                                        May 02, 2008  CHART #:  [id number]   The patient comes today, now 2 years since her surgery.  A CT scan  showed no evidence of recurrence.  Her blood pressure was 112/60, pulse  60, respirations 18, and sats were 98%.  I plan to see her back again in  6 months with a chest x-ray.   Ines Bloomer, M.D.  Electronically Signed   DPB/MEDQ  D:  05/02/2008  T:  05/02/2008  Job:  [id number]

## 2010-07-29 NOTE — Letter (Signed)
November 14, 2008   Feliciana Rossetti, MD  [address]  [place], [place] [address]   Re:  DANALY, BARI                DOB:  04-15-1932   Dear Dr. Shary Decamp:   I saw the patient back today.  Her chest x-ray was stable.  She is now  over 2-1/2 years since her surgery with no evidence of recurrence of her  cancer.  She apparently is having a lot of left-sided abdominal pain and  she says an MRI was done recently at [facility], but is not in  the system.  From my standpoint, she is stable, unless something shows  up in the recent MRI and I will plan to see her again in 6 months with a  CT scan of the chest for further follow up of her lung cancer.   Sincerely,   Ines Bloomer, M.D.  Electronically Signed   DPB/MEDQ  D:  11/14/2008  T:  11/15/2008  Job:  [id number]

## 2010-07-29 NOTE — Procedures (Signed)
RENAL ARTERY DUPLEX EVALUATION   INDICATION:  Follow-up evaluation of left renal artery stenosis.   HISTORY:  Diabetes:  No.  Cardiac:  Arrhythmia.  Hypertension:  No.  Smoking:  Yes.   RENAL ARTERY DUPLEX FINDINGS:  Aorta-Proximal:  59 cm/s  Aorta-Mid:  67 cm/s  Aorta-Distal:  97 cm/s  Celiac Artery Origin:  106 cm/s  SMA Origin:  132 cm/s                                    RIGHT               LEFT  Renal Artery Origin:             102/31 cm/s         189/50 cm/s  Renal Artery Proximal:           182/48 cm/s         231/61 cm/s  Renal Artery Mid:                179/47 cm/s         171/41 cm/s  Renal Artery Distal:             179/40 cm/s         104/37 cm/s  Hilar Acceleration Time (AT):  Renal-Aortic Ratio (RAR):        3.20                3.91  Kidney Size:                     11.6 cm X 4.12 cm   10.3 cm X 5.5 cm  End Diastolic Ratio (EDR):       0.25 to 0.23        0.24 to 0.30  Resistive Index (RI):            0.78                0.76   IMPRESSION:  1. Kidneys are normal with respect to size and shape bilaterally.  2. Resistive indices and end-diastolic ratio suggests no significant      parenchymal disease bilaterally.  3. Right renal to aortic ratio suggests 40 to 60% right renal artery      stenosis.  4. Left renal to aortic ratio suggests >60% left renal artery      stenosis.  5. No significant change from previous study, performed on 12/06/07.   ___________________________________________  Quita Skye. Hart [place], M.D.   MC/MEDQ  D:  06/11/2008  T:  06/11/2008  Job:  [id number]

## 2010-07-29 NOTE — Letter (Signed)
May 21, 2009   Feliciana Rossetti, MD  [address]  [place], [place] [address]   Re:  Courtney Grant, Courtney Grant                DOB:  Jan 10, 1933   Dear Dr. Shary Decamp:   I saw the patient back today.  Her CT scan now 3 years since her surgery  shows no evidence of recurrence of her cancer.  She has gone back to  smoking and I counseled her again to stop smoking.  Her blood pressure  is 131/71, pulse 74, respirations 18, sats were 98%.  I will see her  back again in 6 months with a chest x-ray and then another CT scan in 1  year.   Ines Bloomer, M.D.  Electronically Signed   DPB/MEDQ  D:  05/21/2009  T:  05/22/2009  Job:  [id number]

## 2010-07-29 NOTE — Consult Note (Signed)
VASCULAR SURGERY CONSULTATION   Courtney Grant, Courtney Grant  DOB:  1932-12-03                                       03/08/2007  XLKGM#:[id number]   REFERRING PHYSICIAN:  Donalee Citrin, M.D.   HISTORY:  Courtney Grant is a 77 year old female referred for consultation  by Dr. Wynetta Emery and Dr. Shary Decamp regarding possible left renal artery  stenosis.  This 77 year old female has a history of multiple orthopedic  problems as well as a right lung cancer which was treated by Dr. Jovita Gamma and was recently found by CT angiogram to have plaque at the  origin of her left renal artery.  She was referred for consideration for  treatment.  She denies any history of hypertension or renal  insufficiency and her most recent blood work in July of 2008 revealed a  creatinine of 0.6 and BUN of 8.  She does complain of back discomfort  which has been a problem for her recently.   PAST MEDICAL HISTORY:  Her past medical history is negative for  diabetes, hypertension, coronary artery disease, COPD or stroke.  She  does have hyperlipidemia, fibromyalgia, neuropathy and a history of lung  cancer.   PAST SURGICAL HISTORY:  Includes a left hip replacement, right femur  pinning, right ankle surgery, right lung resection, hysterectomy.   FAMILY HISTORY:  Negative for coronary artery disease, diabetes and  stroke.   SOCIAL HISTORY:  She is married and has 1 children.  She is retired.  She smokes about 4-5 cigarettes per day, having cut down dramatically  after her lung cancer surgery.  She does not use alcohol.   REVIEW OF SYSTEMS AND MEDICATIONS:  Please see health history exam.   ALLERGIES:  IBUPROFEN, ASPIRIN, CODEINE.   PHYSICAL EXAMINATION:  Blood pressure is 139/65, heart rate 63,  respirations are 20.  General:  She is alert and oriented x3.  Her neck  is supple.  Three plus carotid pulses palpable.  No bruits are audible.  Her neurologic exam is normal.  No palpable adenopathy in the neck.  Upper extremity pulses 3+ bilaterally.  Chest clear to auscultation.  Cardiovascular exam reveals regular rhythm.  No murmurs.  Her abdomen is  soft, nontender with no palpable masses.  No bruits are audible.  Lower  extremity pulses are 3+ to femoral, popliteal and dorsalis pedis level.   I reviewed her CT angiogram and agree that she does have a significant  plaque at the origin of her left renal artery.  Both kidneys are well  perfused.  She also has diffuse plaque in her iliac system.  We  performed a renal duplex scan in the office today and it appears that  her stenosis in the origin of the left renal artery is approximately 60-  70%.   There is no indication to treat this lesion in the origin of the left  renal artery with the absence of hypertension and renal insufficiency,  particularly since it appears to only be a moderate stenosis.  We will  follow this in the office with a periodic renal scanning.  She will  return in 9 months to have this performed.   Courtney Grant, M.D.  Electronically Signed  JDL/MEDQ  D:  03/08/2007  T:  03/09/2007  Job:  [id number]   cc:   Donalee Citrin, M.D.  Courtney Rossetti,  MD

## 2010-07-29 NOTE — Assessment & Plan Note (Signed)
OFFICE VISIT   Courtney Grant, Courtney Grant  DOB:  15-Aug-1932                                        November 25, 2006  CHART #:  [id number]   Blood pressure was 115/60, pulse 49, respirations 18, saturation 97%.  Lungs were clear to auscultation and percussion.  Heart:  Regular sinus  rhythm, although there was bradycardia.   Chest x-ray showed no postoperative changes, no evidence of recurrence  of her cancer.   Plan to repeat her CT scan in about 4 to 5 months which will be a little  over a year from her first surgery.   Ines Bloomer, M.D.  Electronically Signed   DPB/MEDQ  D:  11/25/2006  T:  11/26/2006  Job:  [id number]

## 2010-07-29 NOTE — Procedures (Signed)
RENAL ARTERY DUPLEX EVALUATION   INDICATION:  Follow up left renal artery stenosis.   HISTORY:  Diabetes:  No.  Cardiac:  Arrhythmia.  Hypertension:  No.  Smoking:  Yes.   RENAL ARTERY DUPLEX FINDINGS:  Aorta-Proximal:  54 cm/s  Aorta-Mid:  49 cm/s  Aorta-Distal:  85 cm/s  Celiac Artery Origin:  127 cm/s  SMA Origin:  101 cm/s                                    RIGHT               LEFT  Renal Artery Origin:             104 cm/s            208 cm/s  Renal Artery Proximal:           110 cm/s            131 cm/s  Renal Artery Mid:                119 cm/s            118 cm/s  Renal Artery Distal:             98 cm/s             68 cm/s  Hilar Acceleration Time (AT):  Renal-Aortic Ratio (RAR):        2.4                 4.2  Kidney Size:                     11.3                10.2 cm  End Diastolic Ratio (EDR):  Resistive Index (RI):            0.69                0.76   IMPRESSION:  1. The renal aortic ratios suggest a >60% stenosis of the left renal      artery origin and no significant stenosis of the right renal      artery.  2. The bilateral kidney length measurements are within normal limits;      however, the left kidney measures less than the right kidney.  3. The intrarenal resistive indices are normal on the right and      abnormal on the left.  4. No significant change in the left renal artery noted when compared      to the previous examination on 06/11/08 with the right renal artery      velocities appearing less than previously recorded.       ___________________________________________  Quita Skye Hart [place], M.D.   CH/MEDQ  D:  12/31/2008  T:  12/31/2008  Job:  [id number]

## 2010-07-29 NOTE — Assessment & Plan Note (Signed)
OFFICE VISIT   SIDDHI, DORNBUSH  DOB:                                                    August 18, 2004  CHART #:  [id number]   Since the patient saw [place] last time, she fell and broke her left hip.  It  was repaired by Dr. August Saucer.  Chest x-ray is stable.  Shows normal  postoperative changes.   EXAM:  Her lungs are clear to auscultation and percussion.  Blood pressure 112/55, pulse 66, respirations 18, sats are 95%.   She is now 7 months since we did a right upper lobectomy on her.  Plan  to see her back again in 3 months with a chest x-ray and then we will  order a CT scan at that time.   Ines Bloomer, M.D.    DPB/MEDQ  D:  08/18/2006  T:  08/18/2006  Job:  [id number]

## 2010-07-29 NOTE — Assessment & Plan Note (Signed)
OFFICE VISIT   Courtney Grant, Courtney Grant  DOB:                                                    August 18, 2004  CHART #:  [id number]   Since the patient saw [place] last time, she fell and broke her left hip.  It  was repaired by Dr. August Saucer.  Chest x-ray is stable.  Shows normal  postoperative changes.   EXAM:  Her lungs are clear to auscultation and percussion.  Blood pressure 112/55, pulse 66, respirations 18, sats are 95%.   She is now 7 months since we did a right upper lobectomy on her.  Plan  to see her back again in 3 months with a chest x-ray and then we will  order a CT scan at that time.   Ines Bloomer, M.D.  Electronically Signed   DPB/MEDQ  D:  08/18/2006  T:  08/18/2006  Job:  [id number]

## 2010-07-29 NOTE — Procedures (Signed)
RENAL ARTERY DUPLEX EVALUATION   INDICATION:  Follow up left renal artery stenosis.   HISTORY:  Diabetes:  No.  Cardiac:  Irregular heartbeat.  Hypertension:  No.  Smoking:  Yes.   RENAL ARTERY DUPLEX FINDINGS:  Aorta-Proximal:  85 cm/s  Aorta-Mid:  57 cm/s  Aorta-Distal:  64 cm/s  Celiac Artery Origin:  117/23 cm/s  SMA Origin:  116/5 cm/s                                    RIGHT               LEFT  Renal Artery Origin:             158/30 cm/s         251/57 cm/s  Renal Artery Proximal:           141/43 cm/s         293/63 cm/s  Renal Artery Mid:                132/34 cm/s         194/25 cm/s  Renal Artery Distal:             115/31 cm/s         113/25 cm/s  Hilar Acceleration Time (AT):    0.058 sec           0.039 sec  Renal-Aortic Ratio (RAR):        1.86                3.5  Kidney Size:                     11.7 cm X 3.64 cm   10.1 cm X 4.47 cm  End Diastolic Ratio (EDR):  Resistive Index (RI):            0.71                0.71   IMPRESSION:  1. Right renal artery shows evidence of <60% stenosis.  2. Left renal artery shows evidence of >60% stenosis, showing no      significant change from previous study.  3. Bilateral kidneys appear within normal limits in regard to shape      and size.  4. Resistive index is within normal limits bilaterally.   ___________________________________________  Quita Skye Hart [place], M.D.   AS/MEDQ  D:  12/06/2007  T:  12/06/2007  Job:  [id number]   cc:   Feliciana Rossetti, MD

## 2010-07-29 NOTE — Letter (Signed)
May 11, 2007   Dr. Eual Fines A. Chodri  [address]  [place], [place]  [address]   Re:  Courtney Grant, Courtney Grant                DOB:  08-Jun-1932   Dr. Blenda Nicely:   It was pleasure to see the patient.  At the time we saw her 2 weeks  after surgery a CT scan showed no evidence of recurrence.  She is doing  well overall.  I will plan to see her in 6 months with a chest x-ray.  Her blood pressure was117/67, pulse 68, respirations 18, sats 98%.  Lungs clear to auscultation and percussion.   Courtney Grant, M.D.  Electronically Signed   DPB/MEDQ  D:  05/11/2007  T:  05/12/2007  Job:  [id number]

## 2010-08-01 NOTE — H&P (Signed)
NAMEMarland Kitchen  Courtney Grant, Courtney Grant NO.:  [id number]   MEDICAL RECORD NO.:  [id number]          PATIENT TYPE:  INP   LOCATION:  NA                           FACILITY:  [facility]   PHYSICIAN:  Ines Bloomer, M.D. DATE OF BIRTH:  [birth year]   DATE OF ADMISSION:  02/15/2006  DATE OF DISCHARGE:  02/15/2006                              HISTORY & PHYSICAL   CHIEF COMPLAINT:  Right upper lobe lesion.   HISTORY OF PRESENT ILLNESS:  This 77 year old patient has been followed  for over a year with a right upper lobe lesion with a ground glass  appearance.  A PET scan was negative, but the lesion has increased in  size from 1.5 x 1.1.  It was previously 1.5 x 0.8.  Pulmonary function  tests revealed an FVC of 1.97, FEV-1 of 1.4.  She had a bronchoscopy,  which was negative, although there were some atypical cells on lavage.  She has had some previous left chest pain secondary to a fractured rib.  She has had no hemoptysis, fevers, chills, excessive sputum.  She gets  shortness of breath with exertion.  Still continues to smoke.  We went  ahead and got a needle biopsy on this and it showed bronchoalveolar  cancer.   FAMILY HISTORY:  Noncontributory.   SOCIAL HISTORY:  She has not quit smoking.  Does not drink alcohol on a  regular basis.  Is married and retired.   REVIEW OF SYSTEMS:  She is 130 pounds.  She is 5 feet 3 inches.  CARDIAC:  She has no angina or atrial fibrillation.  PULMONARY:  See  history of present illness.  GI:  She has chronic constipation.  GU:  No  dysuria or frequency or frequent urination.  No kidney disease.  VASCULAR:  She has a history of DVT.  NEUROLOGICAL:  No headaches,  blackouts or seizures.  ORTHOPEDIC:  She has probable arthritis, joint  pain.  PSYCHIATRIC:  She has been treated for nervousness.  ENT:  She  has decrease in her eyes and recently no change in her hearing.  HEMATOLOGICAL:  No problems with clotting disorders or bleeding.   ALLERGIES:   ASPIRIN, IBUPROFEN AND CODEINE CAUSES GI UPSET.   MEDICATIONS:  Include:  1. Lopressor 50 mg a day.  2. Coumadin.  3. Diovan 3 mg twice a day.  4. She takes Percocet p.r.n.  5. Metamucil p.r.n.  6. Diprivan 5 mg at night.   PHYSICAL EXAMINATION:  GENERAL:  She is a thin, Caucasian female in no  acute distress.  VITAL SIGNS:  Blood pressure is 110/64.  Pulse of 58.  Respirations 18.  Saturations was 97%.  HEENT:  Head is atraumatic.  Eyes:  Pupils equal and reactive to light  and accommodation.  Extraocular movements are normal.  Ears:  Tympanic membranes intact.  Nose:  There is no septal deviation.  Mouth:  Without lesions.  NECK:  Supple without thyromegaly, as well as supraclavicular, axillary  or adenopathy.  CHEST:  Clear to auscultation and percussion.  HEART:  Regular sinus rhythm.  No murmur.  ABDOMEN:  Soft.  There is no hepatosplenomegaly.  EXTREMITIES:  Pulses are 2+.  There is no clubbing or edema.  NEUROLOGIC:  She is oriented x3.  Sensory and motor are intact.  Cranial  nerves II-XII are intact.  SKIN:  Without lesions.   IMPRESSION:  1. Bronchoalveolar cancer, right upper lobe.  2. History of deep venous thrombosis.  3. Tobacco abuse.  4. Chronic obstructive pulmonary disease.   PLAN:  Right VATS, possible right upper lobectomy versus right superior  segment lobectomy.           ______________________________  Ines Bloomer, M.D.     DPB/MEDQ  D:  02/20/2006  T:  02/21/2006  Job:  [id number]

## 2010-08-01 NOTE — Op Note (Signed)
. [facility]  Patient:    Courtney Grant, Courtney Grant Visit Number: [id number] MRN: [id number]          Service Type: DSU Location: 3000 [id number] Attending Physician:  Mariam Dollar Dictated by:   Garlon Hatchet., M.D. Proc. Date: 02/21/01 Admit Date:  02/21/2001                             Operative Report  PREOPERATIVE DIAGNOSIS:  C6 radiculopathy on the left and cervical spondylosis at C5-6.  PROCEDURE:  Anterior cervical diskectomy and fusion at C5-6 using a 6 mm patellar wedge allograft and a 21 mm Atlantis plate, four 13 mm variable-angled screws.  SURGEON:  Garlon Hatchet., M.D.  ASSISTANT:  Reinaldo Meeker, M.D.  ANESTHESIA:  General endotracheal.  CLINICAL NOTE:  The patient is a very pleasant 77 year old female with long-standing neck pain radiating down her left arm with numbness, tingling in the thumb and forefinger.  The patient failed conservative treatment with anti-inflammatories and physical therapy and pain got progressively worse. Preoperative imaging showed severe cervical spondylosis at C5-6 with compression on the left C6 nerve root.  The patient was extensively counseled of the risks and benefits of anterior cervical diskectomy and fusion and decided to proceed forward.  DESCRIPTION OF PROCEDURE:  The patient was brought in the OR, was induced under general anesthesia.  The right side of the neck was prepped and draped in the usual sterile fashion.  Preoperative x-ray localized a needle over the C5-6 disk space.  A curvilinear incision was made just off the midline to the anterior border of the sternocleidomastoid at this site.  The superficial layer of the platysma was dissected out and divided longitudinally.  The avascular plane between the sternocleidomastoid and the omohyoid and strap muscle was developed down to the prevertebral fascia.  The prevertebral fascia was dissected away with Kitners.  The longus colli was reflected  laterally. Intraoperative x-ray confirmed localization of a needle at the C5-6 disk space, and the longus colli was then further retracted laterally.  The self-retaining retractor was placed.  Annulotomy was made with an 11 blade scalpel.  Pituitary rongeurs were used to remove the anterior margin of the annulus.  There was noted to be a large anterior osteophyte that was bitten off with a Leksell rongeur.  Then the operating microscope was draped and brought into the field and under microscopic illumination, the remainder of the disk space was drilled down with a high-speed drill down to posterior osteophytes and then using a 1 and 2 mm Kerrison punch, the osteophytes coming off the C6 vertebral body and C5 vertebral body were underbitten and the posterior longitudinal ligament was noted to be severely hypertrophied.  This was removed in piecemeal fashion and the thecal sac was visualized and explored out to the C6 neural foramen on the left.  There was a lot of ligamentous hypertrophy and evidence of what looked like epidural steroid injection and severe spondylitic disease compressing the left C6 nerve root. This was radically decompressed, and the C6 nerve root on the left was completely decompressed a significant way out the distal foramen.  Then attention was taken to decompression of the thecal sac out to the right side, and the proximal aspect of the right C6 nerve root was decompressed and explored with angled nerve hook as well as the left side and noted to have no further compression.  The  end plates were then scraped and prepared for arthrodesis.  A 6 mm patellar wedge was sized, selected, and inserted approximately 1 mm deep to the anterior vertebral body line.  Then using a 21 mm Atlantis plate that was drilled, tapped, and four 13 mm variable-angled screws were inserted.  Excellent purchase in all screws, and set screws were tightened.  The wound was copiously irrigated, and  meticulous hemostasis was maintained.  The platysma was reapproximated with a 3-0 interrupted Vicryl, and the skin was closed with a running 4-0 subcuticular.  Benzoin and Steri-Strips were applied.  The patient went to the recovery room in stable condition.  At the end of the case, all needle counts and sponge counts correct.  Postop x-ray confirmed good localization of the plate and screws and bone graft, and the patient went to the recovery room in stable condition. Dictated by:   Garlon Hatchet., M.D. Attending Physician:  Mariam Dollar DD:  02/21/01 TD:  02/21/01 Job: [id number]

## 2010-08-01 NOTE — Op Note (Signed)
NAME:  Courtney Grant, Courtney Grant                ACCOUNT NO.:  [id number]   MEDICAL RECORD NO.:  [id number]          PATIENT TYPE:  AMB   LOCATION:  DSC                          FACILITY:  [facility]   PHYSICIAN:  Nadara Mustard, MD     DATE OF BIRTH:  1932/09/08   DATE OF PROCEDURE:  10/09/2004  DATE OF DISCHARGE:                                 OPERATIVE REPORT   PREOPERATIVE DIAGNOSIS:  Clawing of the toes, left foot, with prominent  second and third metatarsal heads on the left second and third toes, with a  bunion the fifth toe.   POSTOPERATIVE DIAGNOSIS:  Clawing of the toes, left foot, with prominent  second and third metatarsal heads on the left second and third toes, with a  bunion the fifth toe.   PROCEDURES:  1.  Weil osteotomy of the left second and third metatarsals.  2.  A chevron osteotomy of the left fifth metatarsal.   SURGEON.:  Nadara Mustard, MD   ANESTHESIA:  General.   ESTIMATED BLOOD LOSS:  Minimal.   ANTIBIOTICS:  1 gram of Kefzol.   DRAINS:  None.   COMPLICATIONS:  None.   TOURNIQUET TIME:  None.   DISPOSITION:  To PACU in stable condition.   INDICATION FOR PROCEDURE:  The patient is a 77 year old woman with painful  calluses beneath the second and third metatarsal heads with painful clawing  of the second and third toes, pain with shoe wear, as well as pain from the  fifth bunion.  The patient has failed conservative care and presents at this  time for surgical intervention.  The risks and benefits were discussed  including infection, neurovascular injury, persistent pain, nonhealing of  the wounds, need for additional surgery.  The patient states she understands  and wishes to proceed at this time.   PROCEDURE:  The patient was brought to OR room 5 and underwent a general  anesthetic.  After an adequate level of anesthesia obtained, the patient's  left lower extremity was prepped using DuraPrep and draped into a sterile  field.  An incision was made dorsally  between the second and third  metatarsals and the second web space.  Blunt dissection was carried down to  the MTP joint of the second toe.  A Weil osteotomy was performed and the  metatarsal head was translated proximally, and this was stabilized with a 2  x 12 mm mini-fragment cortical screw.  The wound was irrigated.  Hemostasis  was obtained.  Attention was then focused on the third metatarsal.  Again a  Weil osteotomy was performed on the third metatarsal.  This was stabilized with a 2 x 12 mm cortical mini-fragment screw.  The  wound was irrigated and the incision was closed using a 3-0 nylon with a far-  near, near-far suture.  Attention was then focused on the fifth metatarsal  head.  A lateral incision was made, and this was carried down to the  retinaculum.  The retinaculum was retracted.  An ostectomy was first performed, followed  by a chevron osteotomy.  The metatarsal head  was translated medially approximately 3 mm.  This was  stabilized with a 0.045 K-wire and a second ostectomy was then again  performed.  Hemostasis was obtained.  The wound was irrigated with normal  saline.  The capsule was closed using running 2-0 Vicryl.  The skin was  closed using interrupted 3-0 nylon.  The wounds were covered with Adaptic  orthopedic sponges, sterile Webril and a Coban dressing.  The patient was  extubated, taken to the PACU in stable condition.  Plan for touchdown  weightbearing on the left.  The patient has a walker at home.  She has a  prescription for Vicodin.  Follow up in office in two weeks.  Change the  dressing in 3 days.       MVD/MEDQ  D:  10/09/2004  T:  10/10/2004  Job:  [id number]

## 2010-08-01 NOTE — Op Note (Signed)
NAMEMarland Grant  RAZIA, SCREWS NO.:  [id number]   MEDICAL RECORD NO.:  [id number]          PATIENT TYPE:  INP   LOCATION:  3302                         FACILITY:  [facility]   PHYSICIAN:  Ines Bloomer, M.D. DATE OF BIRTH:  13-Apr-1932   DATE OF PROCEDURE:  DATE OF DISCHARGE:                               OPERATIVE REPORT   PREOPERATIVE DIAGNOSIS:  Right upper lobe mass.   POSTOPERATIVE DIAGNOSIS:  Adenocarcinoma of the right upper lobe.   OPERATION PERFORMED:  1. Right video-assisted thoracoscopic surgery.  2. Right thoracotomy.  3. Right upper lobectomy with no dissection.   SURGEON:  Ines Bloomer, M.D.   ANESTHESIA:  General.   PROCEDURE:  After percutaneous insertion of all monitor lines, the  patient underwent general anesthesia, was prepped and draped in the  usual sterile manner, and turned to the right lateral thoracotomy  position.  Two trocar sites were made in the anterior and posterior  axilla at the seventh intercostal space.  Two trocars were inserted and  the right upper lobe was identified.  No intrapleural spread was seen.  A posterolateral thoracotomy was made over the fifth intercostal space,  partially dividing the latissimus, reflecting the serratus anteriorly.  Fifth intercostal space was entered and 2-K was placed in the space.  Dissection was started posterior dissecting out some 10-R nodes and  thickening of the bronchus in the takeoff of the right upper lobe  bronchus.  Then we went to the fissure and dissected out the artery and  the posterior branch to the right upper lobe and ligated it proximally  with 2-0 silk, clipping it, and dividing the superior portion of the  fissure with the EZ-45 stapler.  Attention was then turned to the apical  and posterior branches, which were actually divided.  There was an  apical branch and an anterior branch and these were both dissected out  and divided with the Autosuture 30 white Roticulator,  resecting out  several 10-R nodes.  The superior pulmonary vein was divided.  This  exposed the bronchus which was resected and divided with the Autosuture  staple.  The minor fissure was divided with the EZ-45 stapler and the  Autosuture stapler.  The right upper lobe was removed.  The right middle  lobe was sewn to the right lung lobe to prevent torsion with 2-0 silk.  Inferior pulmonary ligaments were taken down, four drill holes were  placed in the sixth rib, and two chest tubes were placed to the trocar  site and tied and laced with 0 silk.  A Marcaine block was done in the  usual fashion.  A single On-Q catheter was inserted subpleurally in the  usual fashion.  Then the chest was closed with 4-0 pericostal, #1 Vicryl  in the musculature, 2-0 Vicryl in the subcutaneous tissue, and 3-0  Vicryl to the subcuticular tissue.  The patient was returned to the  recovery room in stable condition.           ______________________________  Ines Bloomer, M.D.    DPB/MEDQ  D:  02/22/2006  T:  02/23/2006  Job:  [id number]

## 2010-08-01 NOTE — Op Note (Signed)
[place]  Patient:    JOYCELYN, Courtney Grant                       MRN: [id number] Proc. Date: 12/16/99 Adm. Date:  [id number] Disc. Date: [id number] Attending:  Ivor Messier                           Operative Report  PREOPERATIVE DIAGNOSIS:  Posterior subcapsular cataract right eye.  POSTOPERATIVE DIAGNOSIS:  Posterior subcapsular cataract right eye.  OPERATION:  Planned extracapsular cataract extraction, phacoemulsification, primary insertion of posterior chamber intraocular lens implant.  SURGEON:  Guadelupe Sabin, M.D.  ASSISTANT:  Nurse.  ANESTHESIA:  Local 4% Xylocaine, 0.75% Marcaine.  Anesthesia standby required. The patient was considerably anxious during the preoperative period and complained of claustrophobia in the early operation.  She, however, calmed down with sedation.  DESCRIPTION OF PROCEDURE:  After the patient was prepped and draped, the speculum was inserted in the right eye.  The eye was turned downward and a superior rectus traction suture placed.  Schiotz tonometry was recorded as 6 scale units with a 5.5 gm weight.  A peritomy was performed adjacent to the limbus from the 11 to 1 oclock position.  The corneoscleral junction was cleaned and a corneoscleral groove made with a 45 degree Superblade.  The anterior chamber was then entered with the diamond 2.5 mm keratome at the 12 oclock position and a 15 degrees blade at the 2:30 position.  Using a bent 26 gauge needle on a Healon syringe, a circular capsulorrhexis was begun and completed with the Graebow forceps.  Hydrodissection and hydrodelineation were performed using 1% Xylocaine.  The 30 degree phacoemulsification tip was then inserted with slow control of emulsification of the lens nucleus.  Total ultrasonic time was 1 minute and 1 second.  Average power level is 13%.  Total amount of fluid used during the emulsification 50 cc.  Following removal of the  nucleus, the residual cortex was aspirated with the irrigation-aspiration tip.  The posterior capsular appeared intact with a brilliant red fundus reflex.  It was therefore elected to insert an [employer]er[employer] medical optics [id number] silicone posterior chamber three-piece intraocular lens implant with UV absorber, diopter strength +20.50.  This was inserted with the [employer] forceps into the anterior chamber and then centered into the capsular bag using the [facility] lens rotator.  The lens appeared to be well centered.  The Healon, which had been used during the procedure, was then aspirated and replaced with Miochol ophthalmic solution and a balance salt solution.  Pupil constricted concentrically.  The operative incisions appeared to be self-sealing and no sutures were required.  Maxitrol ointment was instilled in the conjunctival cul-de-sac and a light patch and protector shield applied.  Duration of the procedure and anesthesia administration was 45 minutes.  The patient left the operating room for the recovery room in good condition. DD:  12/16/99 TD:  12/16/99 Job: [id number]

## 2010-08-01 NOTE — Consult Note (Signed)
NAMEMarland Grant  Courtney, Grant NO.:  [id number]   MEDICAL RECORD NO.:  [id number]          PATIENT TYPE:  INP   LOCATION:  5029                         FACILITY:  [facility]   PHYSICIAN:  Lonia Blood, M.D.       DATE OF BIRTH:  [birth year]   DATE OF CONSULTATION:  07/14/2006  DATE OF DISCHARGE:                                 CONSULTATION   REFERRING PHYSICIAN:  Dr. Dorene Grebe, orthopedics.   REASON FOR CONSULTATION:  Management of anticoagulation and preop  evaluation.   HISTORY OF PRESENT ILLNESS:  [name] is a 77 year old woman who got  up on a table to clean some light fixtures and fell from the table.  In  the process, she hurt her left lower extremity and had severe left hip  pain when she got up.  She was brought to the emergency room in [facility]  and was found to have left hip fracture.  She was transferred to [facility] for open reduction, internal fixation.   PAST MEDICAL HISTORY:  1. Deep venous thrombosis in November2003.  2. Pulmonary embolus in 2000.  3. Right upper lobectomy for bronchoalveolar carcinoma in      December2007.  The cancer was found to be stage I.  4. Osteoarthritis.  5. Osteoporosis.  6. Status post hysterectomy.  7. Status post splenectomy.  8. Fibromyalgia.  9. Irritable bowel syndrome.  10.Cataract removal.   HOME MEDICATIONS:  1. Xanax twice a day.  2. Percocet 2 tablets every 4 hours.  3. Digitek 0.125 mg every other day alternating with 0.25 mg every      other day.  4. Metoprolol 75 mg twice a day.  5. Coumadin 3 mg daily.   SOCIAL HISTORY:  She is married and [place]  She is retired.  She still smokes a few cigarettes  every day.  She does not drink alcohol.   FAMILY HISTORY:  Positive for lung cancer.   REVIEW OF SYMPTOMS:  HEENT:  Occasional ophthalmology and history of  cataracts.  Ears:  No hearing deficit.  HEART:  Has history of  palpitations and maybe  atrial fibrillation at times.  She denies chest  pain.  Denies shortness of breath.  LUNGS:  No dyspnea.  No cough.  ABDOMEN:  No abdominal pain currently but she usually has chronic  abdominal pain.  MUSCULOSKELETAL:  Pain in all the joints that the  patient can think of.  Other systems as per HPI, all other systems are  negative.   PHYSICAL EXAMINATION UPON ADMISSION:  VITAL SIGNS:  Shows a temperature  of 100.9, heart rate 93, respiratory rate 18, blood pressure 116/59,  saturation 95% on room air.  GENERAL APPEARANCE:  A frail elderly woman in no acute distress, alert,  oriented to place, person and time.  HEENT:  Head:  Normocephalic, atraumatic.  Eyes:  Pupils equal, round,  react to light and accommodation.  Extraocular movement were intact.  Sclerae anicteric.  Conjunctivae pink.  Throat clear.  NECK:  Supple.  No JVD.  No carotid bruits.  CHEST:  Clear to auscultation bilaterally.  HEART:  Regular rate and rhythm without murmurs, rubs, or gallops.  ABDOMEN:  The patient's abdomen is soft and nontender.  Bowel sounds are  present.  LOWER EXTREMITIES:  Have no edema.  There is diminished range of motion  in the left lower extremity.  SKIN:  Warm and dry without any suspicious rashes.   LABORATORY VALUES:  EKG shows normal sinus rhythm and some nonspecific  ST-T changes.  PT is 33.7, INR 3.4.  Albumin 3.6.  Digoxin 1.86.  Sodium  134, potassium 4.2, chloride 98, bicarb 24, BUN 13, creatinine 0.7,  glucose 131.  White blood cell count 14, hemoglobin 11, platelet count  229.   IMPRESSION AND RECOMMENDATIONS:  1. Left hip fracture.  Obviously the patient requires open reduction,      internal fixation.  She is at intermediate risk for cardiovascular      complications given her age, tobacco abuse, and history of lung      cancer.  She does have a few things in her favor, number 1 being      that she has never had a heart attack and that she does not have a      strong family  history for coronary artery disease.  At this point      in time, we should proceed with the surgical option and monitor the      patient carefully postoperatively.  We will leave the reversal of      the anticoagulation to the primary team.  Since the patient is      tolerating the beta blockers fairly well, I  will continue them for      now.  2. Fever and mild leukocytosis.  I do suspect this is secondary to      fracture, but in case it remains persistent, we will check from      other causes like a possible urinary tract infection or other      cause.  For now,  I will not recommend an antibiotic.  3. Chronic obstructive pulmonary disease and tobacco abuse.  The      patient has been counseled.  Started on nebulizers and incentive      spirometry.  4. Anemia.  I do suspect this is secondary to acute blood loss from      the fracture.  I will keep a close eye on the CBC and transfuse as      needed if the patient drops further.  The reversal of the      anticoagulation should help too.  5. History of deep venous thrombosis and pulmonary embolism.  The      patient is to resume Coumadin postop.      Lonia Blood, M.D.  Electronically Signed     SL/MEDQ  D:  07/14/2006  T:  07/15/2006  Job:  [id number]   cc:   G. Dorene Grebe, M.D.

## 2010-08-01 NOTE — H&P (Signed)
NAME:  Courtney Grant NO.:  [id number]   MEDICAL RECORD NO.:  [id number]          PATIENT TYPE:  INP   LOCATION:  5029                         FACILITY:  [facility]   PHYSICIAN:  Burnard Bunting, M.D.    DATE OF BIRTH:  1932/08/03   DATE OF ADMISSION:  07/14/2006  DATE OF DISCHARGE:                              HISTORY & PHYSICAL   CHIEF COMPLAINT:  Left hip pain.   HISTORY OF PRESENT ILLNESS:  Courtney Grant is a 77 year old ambulatory  female who fell last night without loss of consciousness.  Her husband  was not sure if anything was wrong with her, but this morning when she  was not able to walk or stand he called 9-1-1.  She denies any other  orthopedic complaints.  She denies any chest pain or shortness of  breath.   PAST MEDICAL HISTORY:  Notable for:  1. DVT.  2. Pulmonary embolism.  3. Osteoporotic compression fractures.  4. History of tibial fracture status post IM nail in 2005 with      subsequent DVT and pulmonary embolism.  5. She also has lung cancer status post lobectomy December 2007.  6. Peripheral neuropathy.   MEDICATIONS:  On admission:  1. Xanax.  2. Lopressor 75 p.o. b.i.d.  3. Digoxin 0.225 mg p.o. daily.   ALLERGIES:  SHE IS ALLERGIC TO CODEINE, IBUPROFEN, AND ASPIRIN.   REVIEW OF SYSTEMS:  Notable for no recent chest pain, fever, chills,  shortness of breath.  Fourteen systems were reviewed and negative.   FAMILY HISTORY:  She lives with her husband in [place].  She has an  involved daughter in her care.   PHYSICAL EXAMINATION:  VITAL SIGNS:  Temperature was 100.9, she has a  pulse of 93, respirations 18, blood pressure 116/59, 95% sat on room  air.  GENERAL:  She is comfortable, but somewhat sedated.  NECK:  Has good range of motion.  CHEST:  Clear to auscultation.  HEART:  Regular rate and rhythm.  ABDOMEN:  Benign.  EXTREMITIES:  Left lower extremity is short and externally rotated.  DP  pulse 2+/4 bilaterally.  She has full  range of motion in the bilateral  upper extremities.  Reflexes are symmetric.  No other mass,  lymphadenopathy, skin changes noted in the upper extremities or lower  extremities.   LABORATORY DATA:  Included a hematocrit of 32, white count of 14,000,  digoxin level of 1.86, platelets 229.  Sodium and potassium 134 and  4.27, BUN and creatinine 13 and 0.7.  PT and PTT 3.4 and 48.   RADIOGRAPHS:  Demonstrate a left displaced femoral neck fracture.  Chest  x-ray shows a right hilar opacity.  A CT scan of the head was negative.  C-spine was negative.  EKG shows normal sinus rhythm.   IMPRESSION:  Left femoral neck fracture.  A patient with history of deep  venous thrombosis and pulmonary embolism who is on Coumadin with INR  3.4.   PLAN:  Medical consult.  Intravenous vitamin K 2 mg given.  Hemiarthroplasty is planned when the patient is medically  stable, that  may be Friday or Saturday.      Burnard Bunting, M.D.  Electronically Signed     GSD/MEDQ  D:  07/14/2006  T:  07/15/2006  Job:  [id number]

## 2010-08-01 NOTE — Discharge Summary (Signed)
NAME:  Courtney Grant, Courtney Grant                          ACCOUNT NO.:  [id number]   MEDICAL RECORD NO.:  [id number]                   PATIENT TYPE:  INP   LOCATION:  5705                                 FACILITY:  [facility]   PHYSICIAN:  Rosanne Sack, M.D.         DATE OF BIRTH:  Dec 03, 1932   DATE OF ADMISSION:  02/03/2002  DATE OF DISCHARGE:  02/07/2002                                 DISCHARGE SUMMARY   CHIEF COMPLAINT:  Right leg pain.   DISCHARGE DIAGNOSES:  1. Right leg deep venous thrombosis, admitted, initiated on Lovenox with     transition to Coumadin and discharge INR 2.5.     a. Previous history of right leg fracture, mid-shaft tibia, proximal and        distal fibula, January 21, 1999, surgery per Dr. Burnard Bunting.     b. Status post evaluation by Dr. August Saucer with complaints of right leg pain,        sent to [facility] for Doppler study, right lower        extremity, indicating a deep venous thrombosis, greater saphenous to        popliteal.     c. History of pulmonary emboli, status post right leg fracture with open        reduction and internal fixation in 2000.  The patient was treated with        a course of Coumadin at that time.  2. Hypotension with a history of ? irregular heart beat previously.     a. Blood pressure improved with hold of beta blocker and heart rate        remains regular.     b. Will continue to hold this medication post discharge with followup,        primary care physician.  3. Complaint of headache, suspect secondary to cervical spine degenerative     disk disorder and osteoarthritis, but also likely due to tobacco     discontinuance while hospitalized despite nicotine patch.  Suspect     tobacco withdrawal may be at least in part responsible for headache.  4. Osteoarthritis and osteoporosis.  The patient states she is intolerant of     calcium supplementation.     a. Previous history of fibromyalgia and degenerative disk  disease.  5. History of cataract repair with complaints of diplopia over the last     three to four months, followed by Dr. Pasty Spillers. [place], ophthalmology.  6. Irritable bowel syndrome, followed by gastroenterology in [place] with     colonoscopy every five years.     a. Status post appendectomy, hysterectomy, ovarian cyst removal, bladder        tack.  7. Peripheral neuropathy with no history of diabetes.  8. Listed intolerance to nonsteroidal anti-inflammatory drugs, aspirin,     codeine, calcium, with no true allergies.   DISCHARGE MEDICATIONS:  1. Nicotine patch 21 mg  daily if the patient desires to continue with     smoking cessation.  Follow this post discharge by primary care physician.  2. Protonix 40 mg daily while on Coumadin.  3. Coumadin 3 mg daily in the p.m.  4. Percocet 5/325 mg one tablet every six hours as needed for headache pain,     five days' duration, prescription provided.  5. Atenolol 25 mg daily but previously on Lopressor.  Will ask the patient     to hold this post discharge.   SPECIAL DISCHARGE INSTRUCTIONS:  1. Discharge diet:  Regular consistency, low sodium.  2. Will need followup appointment with Dr. Feliciana Rossetti, primary care     physician, Rosalita Levan, for recheck PT/INR, February 08, 2002.  Patient     requested to call on arrival home today to arrange this appointment.  3. Primary care to aid/assist with tobacco cessation, as felt the patient     desires this post discharge.   CONSULTANTS OVER THE COURSE OF HOSPITALIZATION:  None.   PROCEDURES OVER THE COURSE OF HOSPITALIZATION:  Lower extremity Doppler  study indicating deep venous thrombosis, right lower extremity, greater  saphenous to popliteal.   DISCHARGE LABORATORY DATA:  A PT/INR done February 07, 2002 finds PT 23.7  and INR 2.5.  Repeat metabolic panel, February 07, 2002, finds sodium 138,  potassium 3.7, chloride 102, CO2 25, BUN 7 and creatinine 0.7.  A CBC,  February 07, 2002, found  WBC 6.1, hemoglobin 12.2, hematocrit 36.7,  platelets 234,000.  Coagulopathy panel initiated February 03, 2002 with  protein C normal at 108, protein S normal at 139; cardiolipin antibody 7,  IgM type; cardiolipin antibody IgG type 3.  Antiphospholipid results are all  pending.  Anticardiolipin results are all pending and should be followed  post discharge.   COMMENT:  For admission laboratory data, family history, social history,  review of systems, physical exam, impressions and plan, please see dictated  history and physical, February 03, 2002.   HOSPITAL COURSE:  Problem 1:  RIGHT LEG DEEP VENOUS THROMBOSIS:  The patient  came to see Dr. August Saucer at his office regarding right lower extremity  pain/cramping sensation.  He suspected a vascular abnormality and sent her  to [facility] as an outpatient for Doppler study; this indeed  indicated a DVT of right lower extremity, greater saphenous vein to  popliteal vein.  The patient was admitted for further evaluation and  treatment, acute right leg DVT.  By history, the patient had a previous  right lower extremity fracture, mid-shaft tibia and proximal as well as  distal fibula, January 21, 1999, with ORIF done by Dr. August Saucer, orthopedics.  Around that time, she experienced a pulmonary embolus and was treated with  Coumadin.  The patient was on no anticoagulant at time of this DVT.  A  hypercoagulability panel was obtained but results are pending at time of  discharge and these should be followed by primary care physician post  discharge.  The patient was placed on Lovenox with transition to Coumadin.  INR has climbed to therapeutic range, 2.5 at discharge.  The patient's CBC  remains baseline.  Symptomatology including right calf pain/cramping  sensation resolved over the course of hospitalization.  The patient is  requested to seek an appointment tomorrow with her primary care physician in [place], Dr. Shary Decamp for repeat PT/INR and  any necessary Coumadin  adjustments.  Coumadin therapy will need to continue a full six months post  discharge.  There was no symptomatology to suggest pulmonary emboli, thus a  CT scan of the chest was not obtained.   Problem 2:  HYPOTENSION:  The patient had some borderline low blood  pressures, as low as the 80s diastolic.  She was maintained routinely on  Lopressor, unknown dose, but one and a half tablets twice daily for what she  described as a irregular heart beat.  On admission, she was placed on  atenolol and this was held due to low blood pressures.  There has been no  indications of arrhythmias over the course of hospitalization and I will not  restart a beta blocker post discharge, simply request that her primary care  physician follow up on any irregular heart beats and her blood pressure post  discharge.   Problem 3:  COMPLAINT OF HEADACHE:  The patient does complain of a  generalized headache.  She has known degenerative disk disease and C-spine  disorder and does state that the pain appears to originate from the upper  spine.  The patient also was a lifelong heavy tobacco smoker.  Despite the  fact that a nicotine patch was used on admission to avoid withdrawal, I  suspect there still is some component of nicotine withdrawal here and also  may be responsible for headache.  We will prescribe a short duration of  analgesic post discharge but if this persists, further followup may be  indicated post discharge.   Problem 4:  OSTEOARTHRITIS AND OSTEOPOROSIS:  The patient has a significant  history of both.  This remained stable over the course of hospitalization.  She states she is unable to take calcium due to GI intolerance.   CONDITION AT DISCHARGE:  Stable/improved.   DISCHARGE PROGRESS:  Discharge process greater than 30 minutes, 12 o'clock  noon through 12:45 p.m.   DISPOSITION:  Patient returning home, where she lives independently in the  [place] area.    ACTIVITY:  Activity post discharge -- up as tolerated with no restrictions.   FOLLOWUP:  Followup to be with Dr. Shary Decamp, primary care, [place] area, on  Wednesday, February 08, 2002, with repeat PT/INR at that time and dosing  adjustment of Coumadin as indicated, based on INR.      Everett Graff, N.P.                     Rosanne Sack, M.D.    TC/MEDQ  D:  02/07/2002  T:  02/07/2002  Job:  [id number]   cc:   G. Dorene Grebe, M.D.  [address]  [place]  [place] [address]  Fax: [phone]   [place], [place] Wilfred Curtis.D.

## 2010-08-01 NOTE — Op Note (Signed)
. [facility]  Patient:    Courtney Grant                        MRN: [id number] Proc. Date: 01/21/99 Adm. Date:  [id number] Attending:  Burnard Bunting                           Operative Report  PREOPERATIVE DIAGNOSES: 1. Right mid-shaft tibia fracture. 2. Right proximal and distal fibula fracture.  POSTOPERATIVE DIAGNOSIS: 1. Right mid-shaft tibia fracture. 2. Right proximal and distal fibula fracture.  PROCEDURE: 1. Right closed mid-shaft tibia fracture reaming, measuring, nailing with    a Richards 10.0 x 30.0 mm intramedullary nail with two statically locked    proximal and distal interlocks. 2. Open reduction, internal fixation using a five-hole one-third tubular plate    of distal fibular fracture, and single lag screw.  SURGEON:  Cammy Copa, M.D.  ANESTHESIA:  General endotracheal.  ESTIMATED BLOOD LOSS:  100 cc.  DRAINS:  JP x 2.  INDICATIONS:  [name] is a 77 year old white female who fell at 1700 hours this evening and sustained a right tibia fracture and proximal and distal fibula fractures.  The patients past medical history is notable for a similar fracture 20 years ago, treated in a cast with malalignment per the patient.  DESCRIPTION OF PROCEDURE:  The patient was brought to the operating room where general endotracheal anesthesia was induced.  Preoperative IV antibiotics were administered.  The patient was then prescrubbed with Hibiclens and then prepped  with Betadine which was allowed to air dry, and then a DuraPrep in a sterile manner.  Care was taken not to disrupt the fracture during prepping and draping. After a sterile draping, the operative field was covered with a Betadine-impregnated Vi-Drape.  The tourniquet was placed but not used during the case.  An incision was made along the medial aspect of the patellar tendon beginning in the joint line and extending proximally.  The skin  and subcutaneous tissue was divided.  Using an extra-articular approach, the proximal tibia was exposed.  An awl was used to create an entry point just inferior to the anterior tibial plateau.  Starting portal placement was checked in the AP and lateral planes.  After the canal was entered with the awl, a guide pin was placed down he medullary canal.  The canal was then opened with a 12.0 mm opening reamer.  The  reduction tool was then placed down the medullary shaft, and the manual reduction of the fracture was performed under fluoroscopic guidance in the AP and lateral  plane.  A guide pin was then placed and then packed into the subchondral bone in the distal tibia.  The tibia was them reamed according to a preoperative templating, to 11.0 mm.  It was then measured at 320.0 mm.  A 10.0 mm x 320.0 mm nail was then placed down the shaft of the tibia, across the fracture site, and  into the distal tibia.  The fracture site was not distracted during the passage of the nail, and was impacted retrograde after receding of the nail.  A rotational  alignment of the extremity was confirmed visually.  The fracture reduction was lso confirmed in the AP and lateral planes.  Two proximal and two distal interlocking screws were placed without difficulty.  At this time the incisions on the medial aspect of the  patellar tendon, medial aspect of the proximal tibia, and medial aspect of the distal tibia were irrigated and closed, using a #3-0 nylon suture in the distal incisions, for the distal interlocks, #2-0 Vicryl, and #3-0 nylon for the incision for the proximal interlocks, and a combination of #0 Vicryl, and #2-0 Vicryl, #3-0 nylon closing the incision for the ______ to the proximal tibia.  single Hemovac drain was placed into the incision through the larger incision at the medial portion of the patella.  At this time the Betadine-impregnated Vi-Drape sheet was used to cover  the lateral fibula.  An 8.0 cm skin incision was made, beginning at the midportion of the distal fibula and extending proximally.  Full-thickness periosteal flaps were elevated anteriorly and posteriorly off the distal fibula.  The fracture was visualized.  The fracture was reduced under direct visualization.  A single lag  screw was placed from proximal anterior to distal posterior.  Good fracture reduction was obtained.  A bipolar buttress plate was then placed with six cortices obtained proximally and two cancellus screws placed distally into the fibula. This incision was then irrigated and a Hemovac drain was placed.  It was then closed  using interrupted #2-0 Vicryl to reapproximate the deep subdermal tissue, and then far-near, near-far sutures were used to place the noose to reapproximate the skin edges.  Following the procedure the patient had a problem with VP pulse, and the compartments were soft.  Xeroform and then a bulky Jones dressing was then placed.  The patient was then transferred to the recovery room where she was noted to move her toes and have good capillary refill in the foot. DD:  01/21/99 TD:  01/22/99 Job: [id number]

## 2010-08-01 NOTE — H&P (Signed)
[facility]  Patient:    Courtney Grant, MCCAUGHEY                       MRN: [id number] Adm. Date:  [id number] Disc. Date: [id number] Attending:  Ivor Messier                         History and Physical  REASON FOR ADMISSION:  This was a planned outpatient surgical readmission of this 77 year old white female admitted for cataract implant surgery of the right.  HISTORY OF PRESENT ILLNESS:  This patient has been followed in my office for progressive loss of vision in both eyes with excessive glare due to posterior subcapsular cataract formation in both eyes.  The patient was previously admitted to this hospital on October 14, 1999 and had a planned extracapsular cataract extraction with primary insertion of a posterior chamber intraocular lens implant of the left eye.  The patient did well following this and now is admitted for similar surgery of the right eye.  PAST MEDICAL HISTORY:  Stable general health.  REVIEW OF SYSTEMS:  No cardiorespiratory complaints.  PHYSICAL EXAMINATION:  VITAL SIGNS:  Vital signs as recorded on admission.  GENERAL:  The patient is an alert, well-nourished, well-developed white female in no acute ocular distress.  HEENT:  Eyes:  Visual acuity is 20/25 right eye and 20/25 left eye. Applanation tonometry is normal, 12 mm right eye and 12 mm left eye. Uncorrected vision is 20/300 right eye and 20/50 left eye.  Slit lamp examination shows posterior subcapsular cataract right eye.  Posterior chamber intraocular lens implant with UV absorber left eye.  Fundus examination shows clear vitreous, attached retina.  Normal optic nerve, blood vessels, and macular.  LUNGS:  Clear to auscultation and percussion.  HEART:  Normal sinus rhythm.  No cardiomegaly and no murmurs.  ABDOMEN:  Negative.  EXTREMITIES:  Negative.  DIAGNOSES: 1. Posterior subcapsular cataract right eye. 2. Pseudophakia left eye.  PLAN:  Planned  extracapsular cataract extraction with primary insertion of posterior chamber intraocular lens implant right eye. DD:  12/16/99 TD:  12/16/99 Job: [id number]

## 2010-08-01 NOTE — Discharge Summary (Signed)
NAMEMarland Kitchen  AHMIA, COLFORD NO.:  [id number]   MEDICAL RECORD NO.:  [id number]          PATIENT TYPE:  INP   LOCATION:  5029                         FACILITY:  [facility]   PHYSICIAN:  Burnard Bunting, M.D.    DATE OF BIRTH:  07-Jul-1932   DATE OF ADMISSION:  07/14/2006  DATE OF DISCHARGE:  07/21/2006                               DISCHARGE SUMMARY   DISCHARGE DIAGNOSIS:  Left hip pain.   SECONDARY DIAGNOSES:  1. History of deep venous thrombosis and pulmonary embolism.  2. Osteoporosis.  3. History of tibial fracture, status post intramedullary nail on the      right.  4. History of lung cancer, status post lobectomy December 2007.  5. Peripheral neuropathy.   OPERATIONS/PROCEDURES:  Left hip hemiarthroplasty performed Jul 16, 2006.   HOSPITAL COURSE:  Courtney Grant is a 77 year old ambulatory female who  fell on the day of admission without loss of consciousness and reported  left hip pain.  She was diagnosed with left displaced femoral neck  fracture.  She was admitted to the Orthopedic service.  Medical  consultation was obtained.  Once the patient's INR stabilized, she was  taken to the OR on Jul 16, 2006, at which time she underwent left hip  hemiarthroplasty.  She tolerated the procedure well without immediate  complication.  Leg lengths were approximately equal and she demonstrated  good dorsiflexion and plantar flexion.  She was mobilized in physical  therapy and maintained on heparin until her Coumadin became therapeutic  again, which it was at the time of discharge at the level of 2.4.  She  was transfused one unit of packed red blood cells on Jul 20, 2006, for  hemoglobin of 8.0.  Incision was intact on postop day #3.  She was  treated for a urinary tract infection, which resolved by the time of  discharge.  She had an otherwise unremarkable recovery.  She is  discharged to nursing home in good condition.   DISCHARGE MEDICATIONS:  Include:  1. Levalbuterol  inhaler.  2. Percocet 10/325 one p.o. q.3-4 hours p.r.n. pain.  3. OxyContin 10 mg p.o. b.i.d.  4. Metoprolol 12.5 mg p.o. b.i.d.  5. Robaxin 500 mg p.o. q.8 hours p.r.n. spasm.  6. Digoxin 0.25 mg p.o. q. day.  7. Coumadin 3 mg p.o. q.o.d., 2 mg p.o. q.o.d.  Thus it is alternating      3 mg and 2 mg to INR of 2 to 3.   She will follow up with me in seven days for suture removal.  She can be  weightbearing as tolerated.  Hip precautions are reviewed.      Burnard Bunting, M.D.  Electronically Signed     GSD/MEDQ  D:  07/21/2006  T:  07/21/2006  Job:  [id number]

## 2010-08-01 NOTE — Op Note (Signed)
NAME:  Courtney Grant, Courtney Grant NO.:  [id number]   MEDICAL RECORD NO.:  [id number]          PATIENT TYPE:  INP   LOCATION:  5029                         FACILITY:  [facility]   PHYSICIAN:  Burnard Bunting, M.D.    DATE OF BIRTH:  12/29/1932   DATE OF PROCEDURE:  07/16/2006  DATE OF DISCHARGE:                               OPERATIVE REPORT   PREOPERATIVE DIAGNOSIS:  Left hip fracture.   POSTOPERATIVE DIAGNOSIS:  Left hip fracture.   PROCEDURE:  Left hip hemiarthroplasty.   ATTENDING SURGEON:  Burnard Bunting, M.D.   ASSISTANT:  None.   ANESTHESIA:  General endotracheal.   ESTIMATED BLOOD LOSS:  150.   DRAINS:  Hemovac x1.   INDICATIONS:  [name] is a 77 year old female with a left hip  fracture, who presents now for operative management after explanation of  the risks and benefits.   PROCEDURE IN DETAIL:  The patient was brought to the operating room,  where general endotracheal anesthesia was induced.  Preoperative IV  antibiotics were administered.  The left hip, leg and foot were prepped  with DuraPrep solution and draped in a sterile manner, after the patient  was placed in the lateral decubitus position with the right axilla and  right peroneal nerve well padded.  The operative field was covered with  Ioban.  Preoperative IV antibiotics were administered.  Time-out was  performed.  A posterior approach to the left hip was then utilized.  The  skin and subcutaneous tissue were sharply divided.  The fascia lata was  divided.  Bursectomy was performed.  The external rotators were detached  along the piriformis tendon.  The sciatic nerve was palpated and  protected all throughout the remaining portion of the case.  The capsule  was split.  The femoral head was removed.  The cut was made on the  femoral neck approximately 1 fingerbreadth above the lesser trochanter.  Reaming and broaching were then performed.  A trial prosthesis was  placed with good stability  achieved in full extension, external  rotation, position of sleep in 90 degrees of hip flexion and 10 degrees  of adduction and internal rotation.  At this time, the trial components  were removed.  The true components were placed.  Excellent stability and  approximately equal leg lengths were obtained.  The incision was then  thoroughly irrigated.  The capsule was closed using #1 Tycron.  The  piriformis tendon was tagged back to the capsule.  The fascia lata was  closed over a Hemovac drain.  The Hemovac  drain was placed.  The rest of the incision was closed using  interrupted, and running 0 Vicryl suture, 2-0 Vicryl suture and skin  staples.  A Mepilex dressing was placed.  The patient tolerated the  procedure well without immediate complications.  Leg lengths were  approximately equal at the conclusion of the case.      Burnard Bunting, M.D.  Electronically Signed     GSD/MEDQ  D:  07/19/2006  T:  07/19/2006  Job:  [id number]

## 2010-08-01 NOTE — H&P (Signed)
NAME:  Courtney Grant, Courtney Grant                          ACCOUNT NO.:  [id number]   MEDICAL RECORD NO.:  [id number]                   PATIENT TYPE:  INP   LOCATION:  5799                                 FACILITY:  [facility]   PHYSICIAN:  Rosanne Sack, M.D.         DATE OF BIRTH:  [birth year]   DATE OF ADMISSION:  02/03/2002  DATE OF DISCHARGE:                                HISTORY & PHYSICAL   CHIEF COMPLAINT:  Right leg pain.   PROBLEM LIST:  1. Right leg pain, venous Doppler study as an outpatient per Dr. August Saucer,     orthopaedics, positive for right leg deep vein thrombosis, verbal report,     extending from popliteal to greater saphenous vein.     a. Status post right leg fracture mid-shaft tibia, proximal, and distal        fibula on 01/21/99.  This was surgically repaired by Dr. August Saucer at that        time.     b. Status post evaluation today at office of Dr. August Saucer for complaints of        right leg pain.  The patient sent to outpatient Doppler laboratory at        [facility] where Doppler study showed right leg deep vein        thrombosis greater saphenous to popliteal.     c. Previous history of pulmonary emboli, status post right leg fracture,        open reduction internal fixation in 2000.  Treated with a course of        Coumadin, now off all antiplatelets.  2. History of rapid heart beat, possible undefined arrythmia.     a. Was placed on Lopressor 1-1/2 tablets b.i.d. for that problem, unclear        dosage.  3. History of cataract repair.     a. The patient complains of diplopia for the last three to four months,        followed by Dr. Maple Hudson of ophthalmology.  4. Status post osteoarthritis and osteoporosis with complaints of chronic     back pain.     a. History of fibromyalgia.     b. History of degenerative disk disease, status post anterior cervical        diskectomy with fusion C5-6, Dr. Jillyn Hidden on 02/21/01.  5. Irritable bowel syndrome.     a. Gastrointestinal  followup in [place].  He receives colonoscopy every        five years, reported to be unremarkable by the patient.     b. Status post appendectomy, hysterectomy, ovarian cyst removal, bladder        tack.  6. Peripheral neuropathy of unclear etiology.  No history of diabetes     reported.  7. Intolerance reported to non-steroidal anti-inflammatory drugs, aspirin,     Codeine, calcium with GI upset.   ADMISSION MEDICATIONS:  1. Lopressor 1-1/2 tablets  b.i.d. unclear dose.  2. Darvocet-N 100 one tablet q.4h. p.r.n. pain.   LABORATORY DATA:  Admission labs are pending at time of dictation.  Requested hypercoagulability panel, basic metabolic panel, EKG, CBC,  PT/PTT/INR.  The lower extremity Doppler study as reported above.   FAMILY HISTORY:  Positive for lung cancer.   SOCIAL HISTORY:  The patient lives independently in the [place] area where  the majority of her medical care is obtained.  She is a life-long smoker,  approximately one pack per day.  Previously, a fairly heavy drinker, though  has not used alcohol in many years.   REVIEW OF SYMPTOMS:  HEENT:  Describes diplopia over the past three to four  months followed by ophthalmology.  EARS:  No hearing deficit.  HEART:  Previous report of palpations, placed on Lopressor for this.  The patient  states she has had an echocardiogram and telemetry at that time.  LUNGS:  No  known chronic obstructive pulmonary disease.  Previous history of pulmonary  emboli in 2000 after a right leg fracture surgical repair.  BREASTS:  Clear  mammography routinely, no lumps or surgeries.  ABDOMEN:  Previous surgeries  as listed above.  Likely irritable bowel.  GENITOURINARY:  No symptoms to  suggest current urinary tract infection.  MUSCULOSKELETAL:  Previous  multiple fracture right lower extremity.  Degenerative disk disease with C-  spine surgery, diskectomy.  Significant history of osteoarthritis and  osteoporosis.  NEUROLOGIC:  Peripheral  neuropathy, unclear etiology.  No  history of diabetes.  No seizure, transient ischemic attack, cerebrovascular  accident.   PHYSICAL EXAMINATION:  VITAL SIGNS:  Pending.  Transfer to the medical floor  from vascular laboratory.  HEENT:  Head is normocephalic.  Eyes:  Pupils equal, round, reactive to  light and accommodation.  Ears:  Canals clear.  Nose:  Nares patent without  masses or discharge noted.  Oral mucosa pink and moist.  NECK:  No jugular venous distention or bruits.  HEART:  Regular rate and rhythm without murmurs, S3, S4.  No significant  edema.  LUNGS:  Breath sounds are reduced, particularly in the bases, but clear  without distress or cough.  ABDOMEN:  Soft and round with positive bowel sounds.  No pain or masses with  palpation.  GENITOURINARY:  No bladder pain or CVA tenderness.  MUSCULOSKELETAL:  Mild edema right lower extremity.  The calf size is  somewhat larger on the right then the left.  There is some pain with  palpation over the saphenous vein.  Strength is 5/5 and equal.  Range of  motion somewhat reduced, right shoulder.  NEUROLOGIC:  Cranial nerves II-XII intact.  No unilateral or focal defects.  INTEGUMENTARY:  Skin warm and dry.  Good skin turgor.   IMPRESSION/PLAN:  1. Right leg deep vein thrombosis.  No current indications of pulmonary     emboli.  Lovenox 1 mg/kg dose b.i.d. with transition to Coumadin,     pharmacy to follow dosing.  Will check a chest x-ray to evaluate for any     changes suggestive of pneumonia/pulmonary embolus.  No indication for a     CT scan at this point.  Hypercoagulability panel and coag's, PT/PTT/INR.     We will also evaluate an EKG and metabolic panel, as well as CBC.  2. Questionable history of arrythmia.  No indication of irregular heart beat     per evaluation today.  Placed on atenolol 50 mg.  We will follow with a  12-lead EKG. 3. Osteoarthritis and osteoporosis.  No indication of trauma or any     significant  changes here from previous baseline.  Darvocet-N 100 p.r.n.     for pain control.  4. Irritable bowel syndrome.  Again, no indications of an acute flare.     Protonix 40 mg prophylactically.   CONDITION ON ADMISSION:  Guarded.   ACTIVITY:  Bed rest.   Nicotine patch provide to eliminate nicotine withdrawal.    DIET:  Low sodium.   DISPOSITION:  Admitted to general medical bed, service of Dr. Elliot Gurney, number [phone].      Everett Graff, N.P.                     Rosanne Sack, M.D.    TC/MEDQ  D:  02/03/2002  T:  02/03/2002  Job:  [id number]

## 2010-08-01 NOTE — Discharge Summary (Signed)
NAMEMarland Grant  MAXX, CALAWAY NO.:  [id number]   MEDICAL RECORD NO.:  [id number]          PATIENT TYPE:  INP   LOCATION:  3302                         FACILITY:  [facility]   PHYSICIAN:  Ines Bloomer, M.D. DATE OF BIRTH:  1932-03-20   DATE OF ADMISSION:  02/22/2006  DATE OF DISCHARGE:  02/27/2006                               DISCHARGE SUMMARY   HISTORY OF PRESENT ILLNESS:  The patient is a 77 year old patient  referred to Dr. [name] regarding a right upper lobe lesion.  She  underwent full evaluation and a needle biopsy revealed a  bronchioalveolar cancer.  She was referred this hospitalization for  resection.   PAST MEDICAL HISTORY:  1. History of DVT.  2. History of tobacco abuse/COPD.   FAMILY HISTORY:  Noncontributory.   SOCIAL HISTORY:  Ongoing tobacco abuse.   ALLERGIES:  ASPIRIN, IBUPROFEN AND CODEINE CAUSE GI UPSET.   MEDICATIONS PRIOR TO ADMISSION:  1. Lopressor 50 mg daily.  2. Coumadin.  3. Diovan 3 mg twice daily.  4. Percocet p.r.n.  5. Metamucil p.r.n.  6. Ditropan 5 mg q.h.s.   PHYSICAL EXAMINATION:  Please see the History and Physical done at the  time of admission.   HOSPITAL COURSE:  Patient was admitted electively and taken to the  operating room at which time she underwent the following procedure:  1)  Right video-assisted thoracoscopy, 2) Right thoracotomy, 3) Right upper  lobe lobectomy with lymph node dissection.  The patient tolerated the  procedure well and was taken to the postanesthesia care unit in stable  condition.   POSTOPERATIVE HOSPITAL COURSE:  Patient has done quite well.  All  routine lines, monitors and drainage devices have been discontinued in  the standard fashion.  Incision is healing well without evidence of  infection.  Laboratory values revealed a mild postoperative anemia with  hemoglobin 10.9, hematocrit 32.1.  Electrolytes, BUN and creatinine are  all within normal limits.  She has tolerated a gradual  increase in  activity commensurate for level of postoperative convalescence using  routine protocols.  Her chest x-ray showing stable postoperative  changes.  Oxygen has been weaned and she maintains good saturations on  room air.  She is afebrile and hemodynamically stable and felt to be  stable for discharge on today's date by Dr. [name], February 27, 2006.   MEDICATIONS ON DISCHARGE:  Include the following:  1. Darvocet-N 100 one to two every six hours as needed.  2. Dalmane 30 mg at bedtime p.r.n.  3. Xanax .25 mg q.8.h. p.r.n.  4. She is also to resume her Lopressor, Flonase, Requip and vitamins.  5. Determination restart time of the Coumadin will be determined by      Dr. [name] in followup.   FOLLOWUP:  Patient is scheduled to see Dr. [name] at the office Tuesday  or Wednesday of next week, the office will call with this and a chest x-  ray will be obtained at that time in followup.   INSTRUCTIONS:  The patient received written instructions regarding  medications, activity, diet, wound care and followup.  FINAL DIAGNOSIS:  Lung carcinoma now status post right upper lobe  lobectomy.  The specific pathology is as follows:  Invasive  adenocarcinoma with negative surgical margins of resection and benign  hilar lymph nodes, additional lymph node sampling was done of levels  11R, 10R and 4R and were all negative for tumor.   CONDITION ON DISCHARGE:  Stable and improving.      Rowe Clack, P.A.-C.    ______________________________  Ines Bloomer, M.D.    Sherryll Burger  D:  02/27/2006  T:  02/28/2006  Job:  [id number]   cc:   Dr. Sondra Barges, MD

## 2010-11-05 ENCOUNTER — Other Ambulatory Visit: Payer: Self-pay | Admitting: Thoracic Surgery

## 2010-11-05 DIAGNOSIS — C3490 Malignant neoplasm of unspecified part of unspecified bronchus or lung: Secondary | ICD-10-CM

## 2010-12-03 ENCOUNTER — Other Ambulatory Visit: Payer: Medicare Other

## 2010-12-03 ENCOUNTER — Ambulatory Visit: Payer: Medicare Other | Admitting: Thoracic Surgery

## 2010-12-05 DIAGNOSIS — I8290 Acute embolism and thrombosis of unspecified vein: Secondary | ICD-10-CM

## 2010-12-05 DIAGNOSIS — J449 Chronic obstructive pulmonary disease, unspecified: Secondary | ICD-10-CM

## 2010-12-05 DIAGNOSIS — C3490 Malignant neoplasm of unspecified part of unspecified bronchus or lung: Secondary | ICD-10-CM

## 2010-12-05 DIAGNOSIS — Z720 Tobacco use: Secondary | ICD-10-CM | POA: Insufficient documentation

## 2010-12-09 ENCOUNTER — Ambulatory Visit
Admission: RE | Admit: 2010-12-09 | Discharge: 2010-12-09 | Disposition: A | Discharge status: Home / Self Care | Payer: Medicare Other | Source: Ambulatory Visit | Attending: Thoracic Surgery | Admitting: Thoracic Surgery

## 2010-12-09 ENCOUNTER — Ambulatory Visit (INDEPENDENT_AMBULATORY_CARE_PROVIDER_SITE_OTHER): Payer: Medicare Other | Admitting: Thoracic Surgery

## 2010-12-09 ENCOUNTER — Encounter: Payer: Self-pay | Admitting: Thoracic Surgery

## 2010-12-09 VITALS — BP 104/64 | HR 42 | Resp 18 | Ht 65.0 in | Wt 124.0 lb

## 2010-12-09 DIAGNOSIS — F17200 Nicotine dependence, unspecified, uncomplicated: Secondary | ICD-10-CM

## 2010-12-09 DIAGNOSIS — Z720 Tobacco use: Secondary | ICD-10-CM

## 2010-12-09 DIAGNOSIS — C3490 Malignant neoplasm of unspecified part of unspecified bronchus or lung: Secondary | ICD-10-CM

## 2010-12-09 NOTE — Progress Notes (Signed)
HPI patient returns now 5 years sent to right upper lobectomy was done for adenocarcinoma. She is still smoking. CT scan today showed no evidence of recurrence of her cancer. Her main complaint was having trouble sleeping and hurting all over. She says that she's become weaker her new primary care physician is working this up. Since it has been 5 years, I will let her PCP follow her now on. Since she continues to smoke she needs it is a chest x-ray or low-dose CT scan yearly. Current Outpatient Prescriptions  Medication Sig Dispense Refill  . metoprolol (LOPRESSOR) 50 MG tablet Take 50 mg by mouth daily.        Marland Kitchen omeprazole (PRILOSEC) 20 MG capsule Take 20 mg by mouth daily.        . sucralfate (CARAFATE) 1 G tablet Take 1 g by mouth 4 (four) times daily.           Review of Systems: Feels weak her having trouble sleeping.   Physical Exam  Cardiovascular: Normal rate, regular rhythm and normal heart sounds.   No murmur heard. Pulmonary/Chest: Effort normal. She has wheezes.   incision is well   Diagnostic Tests: CT scan shows no evidence for recurrence of her cancer   Impression: Status post right upper lobectomy for stage I non-small cell lung cancer. Continued tobacco abuse. Anxiety   Plan: Followup by PCP

## 2015-08-07 ENCOUNTER — Other Ambulatory Visit: Payer: Self-pay | Admitting: Internal Medicine

## 2015-08-07 DIAGNOSIS — M546 Pain in thoracic spine: Principal | ICD-10-CM

## 2015-08-07 DIAGNOSIS — G8929 Other chronic pain: Secondary | ICD-10-CM

## 2015-08-11 ENCOUNTER — Inpatient Hospital Stay: Admission: RE | Admit: 2015-08-11 | Payer: Self-pay | Source: Ambulatory Visit

## 2015-10-22 ENCOUNTER — Ambulatory Visit: Payer: Commercial Managed Care - HMO | Admitting: Neurology

## 2015-10-25 ENCOUNTER — Ambulatory Visit (INDEPENDENT_AMBULATORY_CARE_PROVIDER_SITE_OTHER): Payer: Commercial Managed Care - HMO | Admitting: Neurology

## 2015-10-25 ENCOUNTER — Encounter: Payer: Self-pay | Admitting: Neurology

## 2015-10-25 DIAGNOSIS — H8113 Benign paroxysmal vertigo, bilateral: Secondary | ICD-10-CM

## 2015-10-25 DIAGNOSIS — H8110 Benign paroxysmal vertigo, unspecified ear: Secondary | ICD-10-CM | POA: Insufficient documentation

## 2015-10-25 HISTORY — DX: Benign paroxysmal vertigo, unspecified ear: H81.10

## 2015-10-25 NOTE — Progress Notes (Signed)
Reason for visit: Vertigo  Referring physician: Dr. Ursula Beath is a 82 y.o. female  History of present illness:  Courtney Grant is a 82 year old right-handed white female with a history of vertigo that began about 18 months ago. The patient claims that the dizziness has gradually worsened over time. She underwent MRI of the brain on 03/05/2014 as part of workup for the dizziness. This disc is brought for my review, this appears to show a mild to moderate level of small vessel disease in the periventricular white matter and a chronic appearing left cerebellar infarct. No significant ischemia of the brainstem was seen. The patient has also undergone a carotid Doppler study that was unremarkable. She has been on meclizine for the vertigo without much benefit. The patient indicates that she will have onset of nausea, with a true vertigo or spinning sensation, oscillopsia, and a "cotton feeling" in the back of the head. The patient denies any hearing changes, significant ringing in the ears, or ear pain. She has had cervical spine surgery done previously, she used to have cervicogenic headache but this is no longer the case. The patient denies any numbness or weakness of the arms, legs, or face that is new with the vertigo symptoms. The patient has gait instability, she uses a cane for ambulation. She takes Zofran for the nausea. She indicates that certain head positions will bring on the dizziness, looking up or any sudden head movement may worsen symptoms, frequent coughing will also cause problems. The patient indicates that when she rolls to her left side while sleeping, this will bring on the vertigo, she has to roll back on her back or on her right side and remain still with her eyes closed. The patient has never undergone vestibular rehabilitation. She comes to this office for an evaluation. The patient does report some double vision that has been present for quite a number of years,  horizontal in nature.  Past Medical History:  Diagnosis Date  . COPD (chronic obstructive pulmonary disease) ([place])   . Lung cancer ([place]) 02/23/2016   ADENOCARINOMA RUL  . Tobacco abuse   . Venous thrombosis    H/O    Past Surgical History:  Procedure Laterality Date  . LUNG LOBECTOMY  02/23/2006   DR.BURNEY  . REVISION TOTAL HIP ARTHROPLASTY    . THORACOTOMY  02/23/2006   DR.BURNEY  . TIBIA FRACTURE SURGERY      Family History  Problem Relation Age of Onset  . Liver cancer Mother   . Lung cancer Father     Social history:  reports that she has been smoking Cigarettes.  She has been smoking about 1.00 pack per day. She has never used smokeless tobacco. She reports that she drinks alcohol. She reports that she does not use drugs.  Medications:  Prior to Admission medications   Medication Sig Start Date End Date Taking? Authorizing Provider  dicyclomine (BENTYL) 20 MG tablet Take 20 mg by mouth.   Yes Historical Provider, MD  gabapentin (NEURONTIN) 100 MG capsule TAKE 1 CAPSULE BY MOUTH AT BEDTIME FOR 3 DAYS, THEN TAKE 1 CAPSULE BY MOUTH TWICE (2) DAILY 07/26/15  Yes Historical Provider, MD  metoprolol (LOPRESSOR) 50 MG tablet Take 50 mg by mouth daily.     Yes Historical Provider, MD  mirtazapine (REMERON) 7.5 MG tablet  10/05/15  Yes Historical Provider, MD  omeprazole (PRILOSEC) 20 MG capsule Take 20 mg by mouth daily.     Yes Historical  Provider, MD  ondansetron (ZOFRAN) 8 MG tablet  10/21/15  Yes Historical Provider, MD  pantoprazole (PROTONIX) 40 MG tablet  09/30/15  Yes Historical Provider, MD  prochlorperazine (COMPAZINE) 10 MG tablet  10/21/15  Yes Historical Provider, MD  sucralfate (CARAFATE) 1 G tablet Take 1 g by mouth 4 (four) times daily.     Yes Historical Provider, MD      Allergies  Allergen Reactions  . Aspirin Nausea And Vomiting  . Codeine Nausea And Vomiting  . Ibuprofen Nausea And Vomiting  . Penicillins Rash    ROS:  Out of a complete 14 system  review of symptoms, the patient complains only of the following symptoms, and all other reviewed systems are negative.  Blurred vision Dizziness Depression Not enough sleep, decreased energy, change in appetite, disinterest in activities Insomnia, sleepiness  Blood pressure 118/62, pulse 69, height '5\' 3"'$  (1.6 m), weight 116 lb 8 oz (52.8 kg).  Physical Exam  General: The patient is alert and cooperative at the time of the examination.  Eyes: Pupils are equal, round, and reactive to light. Discs are flat bilaterally.  Ears: Tympanic membranes are clear bilaterally.  Neck: The neck is supple, no carotid bruits are noted.  Respiratory: The respiratory examination is clear.  Cardiovascular: The cardiovascular examination reveals a regular rate and rhythm, no obvious murmurs or rubs are noted.  Skin: Extremities are without significant edema.  Neurologic Exam  Mental status: The patient is alert and oriented x 3 at the time of the examination. The patient has apparent normal recent and remote memory, with an apparently normal attention span and concentration ability.  Cranial nerves: Facial symmetry is present. There is good sensation of the face to pinprick and soft touch bilaterally. The strength of the facial muscles and the muscles to head turning and shoulder shrug are normal bilaterally. Speech is well enunciated, no aphasia or dysarthria is noted. Extraocular movements are full. Visual fields are full. The tongue is midline, and the patient has symmetric elevation of the soft palate. No obvious hearing deficits are noted.  Motor: The motor testing reveals 5 over 5 strength of all 4 extremities. Good symmetric motor tone is noted throughout.  Sensory: Sensory testing is intact to pinprick, soft touch, vibration sensation, and position sense on all 4 extremities. No evidence of extinction is noted.  Coordination: Cerebellar testing reveals good finger-nose-finger and heel-to-shin  bilaterally. The Nyan-Barrany procedure was performed, the patient reported no significant vertigo during the procedure, no evidence of nystagmus was seen.  Gait and station: Gait is wide-based, unsteady. The patient uses a cane for ambulation. Tandem gait was not attempted. Romberg is negative.  Reflexes: Deep tendon reflexes are symmetric and normal bilaterally. Toes are downgoing bilaterally.   Assessment/Plan:  1. Vertigo with positional features  2. Chronic gait disorder  3. Small vessel disease by MRI brain  The patient has developed vertigo 18 months ago that appears to have some positional features. She claims that looking up, rolling to the left side while sleeping will bring on vertigo. She feels better when she sits still, and closes her eyes. The vertigo brings on nausea. Meclizine has offered little benefit. The patient will be sent for vestibular rehabilitation. The patient does have some small vessel disease, the chronic appearing left cerebellar stroke likely has nothing to do with her current symptoms. The patient will follow-up in 4 or 5 months.  Jill Alexanders MD 10/25/2015 11:58 AM  [facility] [address]  [place], [place] [address]  Phone [phone] Fax [phone]

## 2015-10-25 NOTE — Patient Instructions (Addendum)

## 2015-11-15 ENCOUNTER — Ambulatory Visit
Payer: Commercial Managed Care - HMO | Attending: Neurology | Admitting: Rehabilitative and Restorative Service Providers"

## 2015-11-15 DIAGNOSIS — R2681 Unsteadiness on feet: Secondary | ICD-10-CM | POA: Diagnosis present

## 2015-11-15 DIAGNOSIS — R2689 Other abnormalities of gait and mobility: Secondary | ICD-10-CM | POA: Insufficient documentation

## 2015-11-15 DIAGNOSIS — R42 Dizziness and giddiness: Secondary | ICD-10-CM | POA: Insufficient documentation

## 2015-11-15 DIAGNOSIS — R29818 Other symptoms and signs involving the nervous system: Secondary | ICD-10-CM | POA: Diagnosis present

## 2015-11-15 NOTE — Patient Instructions (Signed)
Tip Card 1.The goal of habituation training is to assist in decreasing symptoms of vertigo, dizziness, or nausea provoked by specific head and body motions. 2.These exercises may initially increase symptoms; however, be persistent and work through symptoms. With repetition and time, the exercises will assist in reducing or eliminating symptoms. 3.Exercises should be stopped and discussed with the therapist if you experience any of the following: - Sudden change or fluctuation in hearing - New onset of ringing in the ears, or increase in current intensity - Any fluid discharge from the ear - Severe pain in neck or back - Extreme nausea  Copyright  VHI. All rights reserved.  Rolling   With pillow under head, start on back. Roll to your right side.  Hold until dizziness stops, plus 20 seconds and then roll to the left side.  Hold until dizziness stops, plus 20 seconds.  Repeat sequence 5 times per session. Do 2 sessions per day.  Copyright  VHI. All rights reserved.   Gaze Stabilization: Tip Card 1.Target must remain in focus, not blurry, and appear stationary while head is in motion. 2.Perform exercises with small head movements (45 to either side of midline). 3.Increase speed of head motion so long as target is in focus. 4.If you wear eyeglasses, be sure you can see target through lens (therapist will give specific instructions for bifocal / progressive lenses). 5.These exercises may provoke dizziness or nausea. Work through these symptoms. If too dizzy, slow head movement slightly. Rest between each exercise. 6.Exercises demand concentration; avoid distractions. 7.For safety, perform standing exercises close to a counter, wall, corner, or next to someone.  Copyright  VHI. All rights reserved.   Gaze Stabilization: Sitting    Keeping eyes on target in hand or on wall 3 feet away, move head side to side 10 times.  Do _3__ sessions per day.   Copyright  VHI. All rights reserved.

## 2015-11-15 NOTE — Therapy (Signed)
[facility] [address] [place], [place], [address] Phone: [phone]   Fax:  [phone]  Physical Therapy Evaluation  Patient Details  Name: Courtney Grant MRN: [id number] Date of Birth: December 14, 1932 Referring Provider: Margette Fast  Encounter Date: 11/15/2015      PT End of Session - 11/15/15 1704    Visit Number 1   Number of Visits 8   Date for PT Re-Evaluation 01/14/16   Authorization Type [employer]   PT Start Time 0539   PT Stop Time 1535   PT Time Calculation (min) 47 min   Equipment Utilized During Treatment Gait belt   Activity Tolerance Patient tolerated treatment well   Behavior During Therapy [facility] for tasks assessed/performed      Past Medical History:  Diagnosis Date  . Benign paroxysmal positional vertigo 10/25/2015  . COPD (chronic obstructive pulmonary disease) ([place])   . Lung cancer ([place]) 02/23/2016   ADENOCARINOMA RUL  . Tobacco abuse   . Venous thrombosis    H/O    Past Surgical History:  Procedure Laterality Date  . LUNG LOBECTOMY  02/23/2006   DR.BURNEY  . REVISION TOTAL HIP ARTHROPLASTY    . THORACOTOMY  02/23/2006   DR.BURNEY  . TIBIA FRACTURE SURGERY      There were no vitals filed for this visit.       Subjective Assessment - 11/15/15 1450    Subjective The patient reports onset of vertigo 18 months ago around the time her husband passed away.  She reports "I'm dizzy all of the time" and it is worse with bending forward or looking up.  She needs assist to walk out of her home, but can use walls in the home and get around well.  She avoids lying down on her left side because of dizziness.     Pertinent History COPD, h/o lung cancer (partial lobectomy per patient report), MRI revealed cerebellar stroke (chronic and on L side), patient uses a prism in her glasses due to h/o double vision (unsure of timeframe).   Patient Stated Goals Help my eyes focus better, my dizziness  causes blurry vision.     Currently in Pain? Yes   Pain Score 8   sudden onset of L knee pain 3 weeks ago.   Pain Location Knee   Pain Orientation Left   Pain Descriptors / Indicators Aching;Throbbing;Shooting   Pain Type Acute pain   Pain Radiating Towards L below knee--shoots down   Pain Onset 1 to 4 weeks ago   Pain Frequency Intermittent   Aggravating Factors  walking   Pain Relieving Factors medication/lotion that MD prescribed            [facility] PT Assessment - 11/15/15 1510      Assessment   Medical Diagnosis BPPV, MRI revealed chronic infarct L cerebellum   Referring Provider Margette Fast   Onset Date/Surgical Date --  18 months ago= onset   Prior Therapy none     Precautions   Precautions Fall     Restrictions   Weight Bearing Restrictions No     Balance Screen   Has the patient fallen in the past 6 months No   Has the patient had a decrease in activity level because of a fear of falling?  Yes   Is the patient reluctant to leave their home because of a fear of falling?  Yes     [place] residence  Living Arrangements Children  lives with daughter   Type of [place] to enter   Entrance Stairs-Number of Steps 2   Entrance Stairs-Rails --  Needs help to enter via steps   [place] One level   [place] - single point;Shower seat;Walker - 2 wheels  "I do better with the cane"   Additional Comments Gil[place] Rile is too big to use indoors per patient.     Prior Function   Level of Independence Independent with household mobility with device     Observation/Other Assessments   Focus on Therapeutic Outcomes (FOTO)  40%   Dizziness Handicap Inventory (DHI)  82%     ROM / Strength   AROM / PROM / Strength --  to be further assessed     Ambulation/Gait   Ambulation/Gait Yes   Ambulation/Gait Assistance 4: Min guard   Ambulation Distance (Feet) 100 Feet   Assistive device Straight cane    Gait Pattern Decreased stride length;Shuffle;Antalgic;Narrow base of support  pain in left knee   Ambulation Surface Level   Gait velocity 1.59 ft/sec     Standardized Balance Assessment   Standardized Balance Assessment Berg Balance Test     Berg Balance Test   Sit to Stand Able to stand  independently using hands   Standing Unsupported Able to stand 2 minutes with supervision   Sitting with Back Unsupported but Feet Supported on Floor or Stool Able to sit safely and securely 2 minutes   Stand to Sit Controls descent by using hands   Transfers Able to transfer safely, definite need of hands   Standing Unsupported with Eyes Closed Unable to keep eyes closed 3 seconds but stays steady   Standing Ubsupported with Feet Together Needs help to attain position but able to stand for 30 seconds with feet together   From Standing, Reach Forward with Outstretched Arm Reaches forward but needs supervision   From Standing Position, Pick up Object from Floor Able to pick up shoe, needs supervision   From Standing Position, Turn to Look Behind Over each Shoulder Needs supervision when turning   Turn 360 Degrees Needs assistance while turning   Standing Unsupported, Alternately Place Feet on Step/Stool Able to complete >2 steps/needs minimal assist   Standing Unsupported, One Foot in Front Able to take small step independently and hold 30 seconds   Standing on One Leg Unable to try or needs assist to prevent fall   Total Score 26   Berg comment: 26/56 indicating high fall risk            Vestibular Assessment - 11/15/15 1452      Vestibular Assessment   General Observation "I stay dizzy all the time."     Symptom Behavior   Type of Dizziness Blurred vision  room spinning at times, imbalance all the time   Frequency of Dizziness daily, constant   Duration of Dizziness constant, but characteristics of symptoms change.  Worse sensation with bending/looking up, double vision instances where  she is unsure what she is reaching for   Aggravating Factors Rolling to left;Forward bending  looking up   Relieving Factors Head stationary  mild dizziness at rest, not going to fall     Occulomotor Exam   Occulomotor Alignment Abnormal  R eye hypertropia   Spontaneous Absent   Gaze-induced Left beating nystagmus with L gaze   Smooth Pursuits Saccades  noted moving midline to 30 deg L   Saccades  Intact   Comment dizziness worse at larger range towards either side     Vestibulo-Occular Reflex   VOR 1 Head Only (x 1 viewing) Patient reports dizziness and nausea with slow gaze x 1 viewing x 5 reps   Comment Neck rotation limited to 40 degrees bilaterally.  Head impulse test=positive to L side, however patient closes eyes and expresses concern for neck.  *stayed within small ROM during test.  No pain after test.      Positional Testing   Sidelying Test Sidelying Right;Sidelying Left   Horizontal Canal Testing Horizontal Canal Right;Horizontal Canal Left     Sidelying Right   Sidelying Right Duration minimal c/o general blurriness; denies room spinning   Sidelying Right Symptoms No nystagmus     Sidelying Left   Sidelying Left Duration c/o general blurriness; no true room spinning   Sidelying Left Symptoms No nystagmus     Horizontal Canal Right   Horizontal Canal Right Duration none   Horizontal Canal Right Symptoms Normal     Horizontal Canal Left   Horizontal Canal Left Duration only minimal c/o dizziness/ not room spinning (more visual blurriness reported today)   Horizontal Canal Left Symptoms Normal                Vestibular Treatment/Exercise - 11/15/15 1517      Vestibular Treatment/Exercise   Vestibular Treatment Provided Gaze;Habituation   Habituation Exercises Horizontal Roll   Gaze Exercises X1 Viewing Horizontal     Horizontal Roll   Number of Reps  3   Symptom Description  patient reports tolerating left sidelying better today; recommended HEP and  instructed patient + daughter for improved carryover to home     X1 Viewing Horizontal   Foot Position seated   Comments Patient needs cues to perform x 10 reps and c/o mild dizziness and nausea with movement; instructed for HEP with daughter's assist.               PT Education - 11/15/15 1654    Education provided Yes   Education Details HEP: gaze seated and horizontal roll habituation   Person(s) Educated Patient   Methods Explanation;Demonstration;Handout   Comprehension Verbalized understanding;Returned demonstration          PT Short Term Goals - 11/15/15 1705      PT SHORT TERM GOAL #1   Title The patient will be indep with HEP for gaze, habituation, and balance.   Baseline Target date 12/15/2015   Time 4   Period Weeks     PT SHORT TERM GOAL #2   Title The patient will improve Berg from 26/56 to > or equal to 32/56 to demo dec'd risk for falls.   Baseline Target date 12/15/2015   Time 4   Period Weeks     PT SHORT TERM GOAL #3   Title The patient will improve gait speed from 1.59 ft/sec to > or equal to 1.9 ft/sec to demo dec'd risk for falls.   Baseline Target date 12/15/2015   Time 4   Period Weeks     PT SHORT TERM GOAL #4   Title The patient will tolerate gaze x 1 viewing x 20 reps without reports of dizziness/nausea.   Baseline Target date 12/15/2015   Time 4   Period Weeks           PT Long Term Goals - 11/15/15 1707      PT LONG TERM GOAL #1   Title The patient will  improve DHI from 82% to < or equal to 60% to demo improving self perception of dizziness.   Baseline Target date 01/15/2016   Time 8   Period Weeks     PT LONG TERM GOAL #2   Title The patient will tolerate bed mobility of sit<>supine and rolling R<>L without c/o dizziness.   Baseline Target date 01/15/2016   Time 8   Period Weeks     PT LONG TERM GOAL #3   Title The patient will improve Berg score from 26/56 to > or equal to 38/56 to demo dec'ing risk for falls.   Baseline  Target date 01/15/2016   Time 8   Period Weeks     PT LONG TERM GOAL #4   Title The patient will improve gait speed from 1.59 ft/sec to > or equal to 2.2 ft/sec to demo improving mobility.   Baseline Target date 01/15/2016   Time 8   Period Weeks     PT LONG TERM GOAL #5   Title *PT to monitor response to treatment, but no goal to follow for pain due to nature of referral.  Will address as mobility permits.               Plan - 12/01/2015 1709    Clinical Impression Statement The patient is an 82 year old female with complex visual and vestibular presentation.  She reports onset of double vision (unable to recall dates) in history with prism in eye glasses, she has skew eye deviation, positive head thrust test for refixation saccade bilaterally, motion sensitivity with horizontal head motion and bed mobility, nystagmus L beating with L gaze, and worsening balance/gait now requiring assistance when out of her home.  She also has recent onset of L knee pain hindering gait further. She did not have symptoms consistent with BPPV at today's session, however does have report of days that are worse than today in which bending forward and looking up create a different type of subjective sensation of dizziness (this could possibly be intermittent BPPV?).  PT to address motion sensitivity, balance and gaze deficits and progress other activities to tolerance.  We will continue to assess for positional symptoms as indicated.    Rehab Potential Fair   Clinical Impairments Affecting Rehab Potential possible CNS contributing factors, double vision, knee pain.   PT Frequency 1x / week   PT Duration 8 weeks  *patient's daughter off work Friday afternoon-only day able to provide transportation to PT   PT Treatment/Interventions ADLs/Self Care Home Management;Canalith Repostioning;Gait training;Functional mobility training;Therapeutic activities;Therapeutic exercise;Balance training;Neuromuscular  re-education;Patient/family education;Vestibular   PT Next Visit Plan Check HEP, add balance HEP (standing near support + eyes closed, narrowing base of support, standing head motion), progress gait *monitor L knee   Consulted and Agree with Plan of Care Patient;Family member/caregiver   Family Member Consulted Daughter-Lisa      Patient will benefit from skilled therapeutic intervention in order to improve the following deficits and impairments:  Abnormal gait, Decreased activity tolerance, Decreased balance, Decreased mobility, Decreased strength, Postural dysfunction, Dizziness, Pain, Difficulty walking  Visit Diagnosis: Dizziness and giddiness  Other symptoms and signs involving the nervous system  Other abnormalities of gait and mobility  Unsteadiness on feet      G-Codes - Dec 01, 2015 1715    Functional Assessment Tool Used Berg=26/56   Functional Limitation Mobility: Walking and moving around   Mobility: Walking and Moving Around Current Status ([id number]) At least 40 percent but less than 60  percent impaired, limited or restricted   Mobility: Walking and Moving Around Goal Status [phone]) At least 20 percent but less than 40 percent impaired, limited or restricted       Problem List Patient Active Problem List   Diagnosis Date Noted  . Benign paroxysmal positional vertigo 10/25/2015  . Venous thrombosis   . COPD (chronic obstructive pulmonary disease) ([place])   . Tobacco abuse   . Lung cancer ([place])   . NONSPECIFIC ABN FINDING RAD & OTH EXAM GI TRACT 08/20/2009    [place], PT 11/15/2015, 5:16 PM  [facility] [address] [place], [place], [address] Phone: [phone]   Fax:  [phone]  Name: Courtney Grant MRN: [id number] Date of Birth: 1932-09-22

## 2015-11-22 ENCOUNTER — Ambulatory Visit: Payer: Commercial Managed Care - HMO | Admitting: Rehabilitative and Restorative Service Providers"

## 2015-12-06 ENCOUNTER — Ambulatory Visit: Payer: Commercial Managed Care - HMO | Admitting: Rehabilitative and Restorative Service Providers"

## 2015-12-13 ENCOUNTER — Ambulatory Visit: Payer: Commercial Managed Care - HMO | Admitting: Rehabilitative and Restorative Service Providers"

## 2016-01-24 ENCOUNTER — Telehealth: Payer: Self-pay | Admitting: Sports Medicine

## 2016-01-24 NOTE — Telephone Encounter (Signed)
Received [employer] Referral to Schedule pt with Dr. Cannon Kettle @ [facility] office. Unable to leave vm, 2nd try.

## 2016-01-29 ENCOUNTER — Ambulatory Visit (INDEPENDENT_AMBULATORY_CARE_PROVIDER_SITE_OTHER): Payer: Commercial Managed Care - HMO

## 2016-01-29 ENCOUNTER — Encounter: Payer: Self-pay | Admitting: Sports Medicine

## 2016-01-29 ENCOUNTER — Ambulatory Visit (INDEPENDENT_AMBULATORY_CARE_PROVIDER_SITE_OTHER): Payer: Commercial Managed Care - HMO | Admitting: Sports Medicine

## 2016-01-29 DIAGNOSIS — M2040 Other hammer toe(s) (acquired), unspecified foot: Secondary | ICD-10-CM | POA: Diagnosis not present

## 2016-01-29 DIAGNOSIS — F17200 Nicotine dependence, unspecified, uncomplicated: Secondary | ICD-10-CM

## 2016-01-29 DIAGNOSIS — M1990 Unspecified osteoarthritis, unspecified site: Secondary | ICD-10-CM

## 2016-01-29 DIAGNOSIS — C801 Malignant (primary) neoplasm, unspecified: Secondary | ICD-10-CM | POA: Insufficient documentation

## 2016-01-29 DIAGNOSIS — R112 Nausea with vomiting, unspecified: Secondary | ICD-10-CM | POA: Insufficient documentation

## 2016-01-29 DIAGNOSIS — K449 Diaphragmatic hernia without obstruction or gangrene: Secondary | ICD-10-CM

## 2016-01-29 DIAGNOSIS — K219 Gastro-esophageal reflux disease without esophagitis: Secondary | ICD-10-CM

## 2016-01-29 DIAGNOSIS — Z9889 Other specified postprocedural states: Secondary | ICD-10-CM | POA: Insufficient documentation

## 2016-01-29 DIAGNOSIS — Z8739 Personal history of other diseases of the musculoskeletal system and connective tissue: Secondary | ICD-10-CM | POA: Diagnosis not present

## 2016-01-29 DIAGNOSIS — Z86711 Personal history of pulmonary embolism: Secondary | ICD-10-CM

## 2016-01-29 DIAGNOSIS — F32A Depression, unspecified: Secondary | ICD-10-CM | POA: Insufficient documentation

## 2016-01-29 DIAGNOSIS — G894 Chronic pain syndrome: Secondary | ICD-10-CM

## 2016-01-29 DIAGNOSIS — M79671 Pain in right foot: Secondary | ICD-10-CM

## 2016-01-29 DIAGNOSIS — R0602 Shortness of breath: Secondary | ICD-10-CM | POA: Insufficient documentation

## 2016-01-29 DIAGNOSIS — L97511 Non-pressure chronic ulcer of other part of right foot limited to breakdown of skin: Secondary | ICD-10-CM | POA: Diagnosis not present

## 2016-01-29 DIAGNOSIS — I639 Cerebral infarction, unspecified: Secondary | ICD-10-CM

## 2016-01-29 DIAGNOSIS — M8580 Other specified disorders of bone density and structure, unspecified site: Secondary | ICD-10-CM

## 2016-01-29 DIAGNOSIS — M21619 Bunion of unspecified foot: Secondary | ICD-10-CM

## 2016-01-29 DIAGNOSIS — N3941 Urge incontinence: Secondary | ICD-10-CM

## 2016-01-29 DIAGNOSIS — R109 Unspecified abdominal pain: Secondary | ICD-10-CM | POA: Insufficient documentation

## 2016-01-29 DIAGNOSIS — R110 Nausea: Secondary | ICD-10-CM | POA: Insufficient documentation

## 2016-01-29 DIAGNOSIS — K828 Other specified diseases of gallbladder: Secondary | ICD-10-CM | POA: Insufficient documentation

## 2016-01-29 DIAGNOSIS — I10 Essential (primary) hypertension: Secondary | ICD-10-CM

## 2016-01-29 DIAGNOSIS — M797 Fibromyalgia: Secondary | ICD-10-CM | POA: Insufficient documentation

## 2016-01-29 DIAGNOSIS — F329 Major depressive disorder, single episode, unspecified: Secondary | ICD-10-CM | POA: Insufficient documentation

## 2016-01-29 DIAGNOSIS — K148 Other diseases of tongue: Secondary | ICD-10-CM | POA: Insufficient documentation

## 2016-01-29 DIAGNOSIS — R42 Dizziness and giddiness: Secondary | ICD-10-CM | POA: Insufficient documentation

## 2016-01-29 DIAGNOSIS — R269 Unspecified abnormalities of gait and mobility: Secondary | ICD-10-CM | POA: Insufficient documentation

## 2016-01-29 HISTORY — DX: Depression, unspecified: F32.A

## 2016-01-29 HISTORY — DX: Unspecified osteoarthritis, unspecified site: M19.90

## 2016-01-29 HISTORY — DX: Gastro-esophageal reflux disease without esophagitis: K21.9

## 2016-01-29 HISTORY — DX: Personal history of pulmonary embolism: Z86.711

## 2016-01-29 HISTORY — DX: Cerebral infarction, unspecified: I63.9

## 2016-01-29 HISTORY — DX: Urge incontinence: N39.41

## 2016-01-29 HISTORY — DX: Fibromyalgia: M79.7

## 2016-01-29 HISTORY — DX: Essential (primary) hypertension: I10

## 2016-01-29 HISTORY — DX: Diaphragmatic hernia without obstruction or gangrene: K44.9

## 2016-01-29 HISTORY — DX: Other specified diseases of gallbladder: K82.8

## 2016-01-29 NOTE — Progress Notes (Signed)
Subjective: Courtney Grant is a 82 y.o. female patient seen in office for evaluation of right 2nd toe pain x 1 month that has been getting worse, states that her big toe crosses over the 2nd toe and that it is very painful with walking. Patient states that her toes have been like this for years. Patient is assisted by daughter who wants to discuss was to fix the toes. Denies nausea/fever/vomiting/chills/night sweats/shortness of breath. Patient has no other pedal complaints at this time.  Patient Active Problem List   Diagnosis Date Noted  . Abdominal pain 01/29/2016  . Altered gait 01/29/2016  . Arthritis 01/29/2016  . Biliary dyskinesia 01/29/2016  . Cancer ([place]) 01/29/2016  . Depression 01/29/2016  . Dizziness 01/29/2016  . Fibromyalgia 01/29/2016  . Hiatal hernia with GERD 01/29/2016  . Hypertension 01/29/2016  . Nausea 01/29/2016  . PONV (postoperative nausea and vomiting) 01/29/2016  . Pulmonary embolism ([place]) 01/29/2016  . Shortness of breath 01/29/2016  . Stroke ([place]) 01/29/2016  . Tongue thick 01/29/2016  . Urgency incontinence 01/29/2016  . Benign paroxysmal positional vertigo 10/25/2015  . Venous thrombosis   . Chronic obstructive pulmonary disease ([place])   . Tobacco abuse   . Cancer of lung ([place])   . NONSPECIFIC ABN FINDING RAD & OTH EXAM GI TRACT 08/20/2009   Current Outpatient Prescriptions on File Prior to Visit  Medication Sig Dispense Refill  . dicyclomine (BENTYL) 20 MG tablet Take 20 mg by mouth.    . gabapentin (NEURONTIN) 100 MG capsule TAKE 1 CAPSULE BY MOUTH AT BEDTIME FOR 3 DAYS, THEN TAKE 1 CAPSULE BY MOUTH TWICE (2) DAILY    . metoprolol (LOPRESSOR) 50 MG tablet Take 50 mg by mouth daily.      . mirtazapine (REMERON) 7.5 MG tablet     . omeprazole (PRILOSEC) 20 MG capsule Take 20 mg by mouth daily.      . ondansetron (ZOFRAN) 8 MG tablet     . pantoprazole (PROTONIX) 40 MG tablet     . prochlorperazine (COMPAZINE) 10 MG tablet     . sucralfate  (CARAFATE) 1 G tablet Take 1 g by mouth 4 (four) times daily.       No current facility-administered medications on file prior to visit.    Allergies  Allergen Reactions  . Aspirin Nausea And Vomiting  . Codeine Nausea And Vomiting  . Ibuprofen Nausea And Vomiting  . Penicillins Rash    No results found for this or any previous visit (from the past 2160 hour(s)).  Objective: There were no vitals filed for this visit.  General: Patient is awake, alert, oriented x 3 and in no acute distress.  Dermatology: Skin is warm and dry bilateral with a soft callus plantar right 2nd toe once debrided reveal scant brown drainage and a partial thickness ulceration present plantar right 2nd toe. Ulceration measures 1 cm x 0.5cm x 0.2 cm. There is a keratotic border with a granular base. The ulceration does not probe to bone. There is no malodor, no erythema, no edema. No other acute signs of infection.   Vascular: Dorsalis Pedis pulse = 1/4 Bilateral,  Posterior Tibial pulse = 0/4 Bilateral,  Capillary Fill Time < 5 seconds, varicosities and purple discoloration to toes  Neurologic: Gross sensation present via light touch  Musculosketal: There is severe bunion with crossover deformity and lesser hammertoe right>left with digital subluxation, There is pain with palpation to ulcerated area. No pain with compression to calves bilateral.   [place] Shouts  foot:Severe bunion, hammertoe, subluxation of digits, diffuse arthritis and osteopenia, No bony destruction suggestive of osteomyelitis. No gas in soft tissues.   Assessment and Plan:  Problem List Items Addressed This Visit    None    Visit Diagnoses    Toe ulcer, right, limited to breakdown of skin ([place])    -  Primary   Relevant Orders   WOUND CULTURE   Right foot pain       Relevant Orders   DG Foot 2 Views Right   WOUND CULTURE   History of fibromyalgia       Relevant Orders   DG Foot 2 Views Right   WOUND CULTURE   Chronic pain disorder        Relevant Orders   DG Foot 2 Views Right   WOUND CULTURE   Current smoker       Relevant Orders   DG Foot 2 Views Right   WOUND CULTURE   Bunion       Hammer toe, unspecified laterality       Osteopenia determined by x-ray         -Examined patient and discussed the progression of the wound and treatment alternatives. -Advised patient that at her age the option to straighten her toes are limited because of her age and bone quality and circulation and that she would be better off with 2nd toe amputation if we had to do a procedure however at this time recommend getting the ulcerated area well first and informed patient and daughter that we can try some toe spacers for now to prevent bunion from rubbing and overlapping so much onto the 2nd to -Xrays reviewed -For symptomatic relief gave pain injection consisting of 1cc lidocaine, marcaine, 1/2cc of dex and kenalog 10 to right 2nd toe - Excisionally dedbrided ulceration to healthy bleeding borders using a sterile chisel blade plantar right 2nd toe and cultured scant drainage. Wound culture sent to [facility]. Will call patient if we need to start oral antiboitics. -Applied topical antibiotic cream and dry sterile dressing and instructed patient to continue with daily dressings at home consisting of same - Advised patient to go to the ER or return to office if the wound worsens or if constitutional symptoms are present. -Patient to return to office in 2 weeks for follow up care and evaluation or sooner if problems arise.  Landis Martins, DPM

## 2016-02-03 ENCOUNTER — Telehealth: Payer: Self-pay | Admitting: *Deleted

## 2016-02-03 MED ORDER — CLINDAMYCIN HCL 300 MG PO CAPS: 300.0000 mg | ORAL_CAPSULE | Freq: Four times a day (QID) | ORAL | 0 refills | Status: DC

## 2016-02-03 NOTE — Telephone Encounter (Addendum)
-----   Message from Landis Martins, [place] sent at 02/03/2016  4:57 PM EST ----- Regarding: [place] Wound Culture results & send antibiotics Will you call patient to let her know the drainage that I cultured from her toe came back + for Bacteria (Staph not MRSA) and send to her pharmacy Clindamycin '300mg'$  qid x 14 days. Thanks Dr. Cannon Kettle. Informed pt's Dtr, [name] of the new antibiotic order.

## 2016-02-19 ENCOUNTER — Ambulatory Visit: Payer: Commercial Managed Care - HMO | Admitting: Sports Medicine

## 2016-02-20 DIAGNOSIS — R112 Nausea with vomiting, unspecified: Secondary | ICD-10-CM | POA: Diagnosis not present

## 2016-02-20 DIAGNOSIS — E871 Hypo-osmolality and hyponatremia: Secondary | ICD-10-CM

## 2016-02-20 DIAGNOSIS — I10 Essential (primary) hypertension: Secondary | ICD-10-CM

## 2016-02-20 DIAGNOSIS — R079 Chest pain, unspecified: Secondary | ICD-10-CM | POA: Diagnosis not present

## 2016-02-20 DIAGNOSIS — F17210 Nicotine dependence, cigarettes, uncomplicated: Secondary | ICD-10-CM

## 2016-02-20 DIAGNOSIS — Z902 Acquired absence of lung [part of]: Secondary | ICD-10-CM

## 2016-02-20 DIAGNOSIS — R1013 Epigastric pain: Secondary | ICD-10-CM | POA: Diagnosis not present

## 2016-02-20 DIAGNOSIS — E860 Dehydration: Secondary | ICD-10-CM | POA: Diagnosis not present

## 2016-02-20 DIAGNOSIS — Z95828 Presence of other vascular implants and grafts: Secondary | ICD-10-CM

## 2016-02-20 DIAGNOSIS — Z85118 Personal history of other malignant neoplasm of bronchus and lung: Secondary | ICD-10-CM

## 2016-02-20 DIAGNOSIS — Z86711 Personal history of pulmonary embolism: Secondary | ICD-10-CM

## 2016-02-20 DIAGNOSIS — J449 Chronic obstructive pulmonary disease, unspecified: Secondary | ICD-10-CM

## 2016-02-21 ENCOUNTER — Encounter: Payer: Self-pay | Admitting: Rehabilitative and Restorative Service Providers"

## 2016-02-21 DIAGNOSIS — E860 Dehydration: Secondary | ICD-10-CM | POA: Diagnosis not present

## 2016-02-21 DIAGNOSIS — R1013 Epigastric pain: Secondary | ICD-10-CM | POA: Diagnosis not present

## 2016-02-21 DIAGNOSIS — R112 Nausea with vomiting, unspecified: Secondary | ICD-10-CM | POA: Diagnosis not present

## 2016-02-21 DIAGNOSIS — R079 Chest pain, unspecified: Secondary | ICD-10-CM | POA: Diagnosis not present

## 2016-02-21 NOTE — Therapy (Signed)
[facility] [address] [place], [place], [address] Phone: [phone]   Fax:  [phone]  Patient Details  Name: Courtney Grant MRN: [id number] Date of Birth: 03-28-1932 Referring Provider:  No ref. provider found  Encounter Date: last encounter 11/15/2015  PHYSICAL THERAPY DISCHARGE SUMMARY  Visits from Start of Care: eval only   Current functional level related to goals / functional outcomes: See initial summary   Remaining deficits: See initial summary   Education / Equipment: HEP at evaluation established  Plan: Patient agrees to discharge.  Patient goals were not met. Patient is being discharged due to not returning since the last visit.  ?????        Thank you for the referral of this patient. Rudell Cobb, MPT   Courtney Grant 02/21/2016, 12:33 PM  [facility] [address] [place], [place], [address] Phone: [phone]   Fax:  [phone]

## 2016-02-23 DIAGNOSIS — Z9981 Dependence on supplemental oxygen: Secondary | ICD-10-CM | POA: Insufficient documentation

## 2016-02-23 DIAGNOSIS — C3490 Malignant neoplasm of unspecified part of unspecified bronchus or lung: Secondary | ICD-10-CM

## 2016-02-23 HISTORY — DX: Malignant neoplasm of unspecified part of unspecified bronchus or lung: C34.90

## 2016-03-25 DIAGNOSIS — Z5181 Encounter for therapeutic drug level monitoring: Secondary | ICD-10-CM | POA: Diagnosis not present

## 2016-03-25 DIAGNOSIS — G8929 Other chronic pain: Secondary | ICD-10-CM | POA: Diagnosis not present

## 2016-03-25 DIAGNOSIS — R42 Dizziness and giddiness: Secondary | ICD-10-CM | POA: Diagnosis not present

## 2016-03-25 DIAGNOSIS — Z79899 Other long term (current) drug therapy: Secondary | ICD-10-CM | POA: Diagnosis not present

## 2016-03-25 DIAGNOSIS — J449 Chronic obstructive pulmonary disease, unspecified: Secondary | ICD-10-CM | POA: Diagnosis not present

## 2016-03-25 DIAGNOSIS — M5440 Lumbago with sciatica, unspecified side: Secondary | ICD-10-CM | POA: Diagnosis not present

## 2016-03-25 DIAGNOSIS — M797 Fibromyalgia: Secondary | ICD-10-CM | POA: Diagnosis not present

## 2016-03-27 ENCOUNTER — Encounter: Payer: Self-pay | Admitting: Sports Medicine

## 2016-03-27 ENCOUNTER — Ambulatory Visit (INDEPENDENT_AMBULATORY_CARE_PROVIDER_SITE_OTHER): Payer: PPO | Admitting: Sports Medicine

## 2016-03-27 DIAGNOSIS — L97511 Non-pressure chronic ulcer of other part of right foot limited to breakdown of skin: Secondary | ICD-10-CM

## 2016-03-27 DIAGNOSIS — Z8739 Personal history of other diseases of the musculoskeletal system and connective tissue: Secondary | ICD-10-CM

## 2016-03-27 DIAGNOSIS — Z01818 Encounter for other preprocedural examination: Secondary | ICD-10-CM | POA: Diagnosis not present

## 2016-03-27 DIAGNOSIS — M79671 Pain in right foot: Secondary | ICD-10-CM

## 2016-03-27 DIAGNOSIS — G894 Chronic pain syndrome: Secondary | ICD-10-CM

## 2016-03-27 DIAGNOSIS — M2528 Flail joint, other site: Secondary | ICD-10-CM | POA: Diagnosis not present

## 2016-03-27 DIAGNOSIS — F17200 Nicotine dependence, unspecified, uncomplicated: Secondary | ICD-10-CM

## 2016-03-27 NOTE — Patient Instructions (Signed)
Pre-Operative Instructions  Congratulations, you have decided to take an important step to improving your quality of life.  You can be assured that the doctors of [facility] will be with you every step of the way.  1. Plan to be at the surgery center/hospital at least 1 (one) hour prior to your scheduled time unless otherwise directed by the surgical center/hospital staff.  You must have a responsible adult accompany you, remain during the surgery and drive you home.  Make sure you have directions to the surgical center/hospital and know how to get there on time. 2. For hospital based surgery you will need to obtain a history and physical form from your family physician within 1 month prior to the date of surgery- we will give you a form for you primary physician.  3. We make every effort to accommodate the date you request for surgery.  There are however, times where surgery dates or times have to be moved.  We will contact you as soon as possible if a change in schedule is required.   4. No Aspirin/Ibuprofen for one week before surgery.  If you are on aspirin, any non-steroidal anti-inflammatory medications (Mobic, Aleve, Ibuprofen) you should stop taking it 7 days prior to your surgery.  You make take Tylenol  For pain prior to surgery.  5. Medications- If you are taking daily heart and blood pressure medications, seizure, reflux, allergy, asthma, anxiety, pain or diabetes medications, make sure the surgery center/hospital is aware before the day of surgery so they may notify you which medications to take or avoid the day of surgery. 6. No food or drink after midnight the night before surgery unless directed otherwise by surgical center/hospital staff. 7. No alcoholic beverages 24 hours prior to surgery.  No smoking 24 hours prior to or 24 hours after surgery. 8. Wear loose pants or shorts- loose enough to fit over bandages, boots, and casts. 9. No slip on shoes, sneakers are best. 10. Bring  your boot with you to the surgery center/hospital.  Also bring crutches or a walker if your physician has prescribed it for you.  If you do not have this equipment, it will be provided for you after surgery. 11. If you have not been contracted by the surgery center/hospital by the day before your surgery, call to confirm the date and time of your surgery. 12. Leave-time from work may vary depending on the type of surgery you have.  Appropriate arrangements should be made prior to surgery with your employer. 13. Prescriptions will be provided immediately following surgery by your doctor.  Have these filled as soon as possible after surgery and take the medication as directed. 14. Remove nail polish on the operative foot. 15. Wash the night before surgery.  The night before surgery wash the foot and leg well with the antibacterial soap provided and water paying special attention to beneath the toenails and in between the toes.  Rinse thoroughly with water and dry well with a towel.  Perform this wash unless told not to do so by your physician.  Enclosed: 1 Ice pack (please put in freezer the night before surgery)   1 Hibiclens skin cleaner   Pre-op Instructions  If you have any questions regarding the instructions, do not hesitate to call our office.  [place]: [address] [place], [place] [address] [phone]  [place]: [address] [place], [place] [address] [phone]  [place]: [address]  [place], [place] [address] [phone]   Dr.   Norman Regal DPM, Dr. Matthew Wagoner DPM, Dr. M. Todd Hyatt DPM, Dr. Eavan Gonterman DPM 

## 2016-03-27 NOTE — Progress Notes (Signed)
Subjective: Courtney Grant is a 83 y.o. female patient seen in office for follow up evaluation of right 2nd toe pain and ulceration. Patient states that there is no opening or drainage. Took all her antibiotics and wants to discuss removing the toe. Patient is assisted by daughter.  Patient has no other pedal complaints at this time.  Patient Active Problem List   Diagnosis Date Noted  . Abdominal pain 01/29/2016  . Altered gait 01/29/2016  . Arthritis 01/29/2016  . Biliary dyskinesia 01/29/2016  . Cancer ([place]) 01/29/2016  . Depression 01/29/2016  . Dizziness 01/29/2016  . Fibromyalgia 01/29/2016  . Hiatal hernia with GERD 01/29/2016  . Hypertension 01/29/2016  . Nausea 01/29/2016  . PONV (postoperative nausea and vomiting) 01/29/2016  . Pulmonary embolism ([place]) 01/29/2016  . Shortness of breath 01/29/2016  . Stroke ([place]) 01/29/2016  . Tongue thick 01/29/2016  . Urgency incontinence 01/29/2016  . Benign paroxysmal positional vertigo 10/25/2015  . Venous thrombosis   . Chronic obstructive pulmonary disease ([place])   . Tobacco abuse   . Cancer of lung ([place])   . NONSPECIFIC ABN FINDING RAD & OTH EXAM GI TRACT 08/20/2009   Current Outpatient Prescriptions on File Prior to Visit  Medication Sig Dispense Refill  . clindamycin (CLEOCIN) 300 MG capsule Take 1 capsule (300 mg total) by mouth 4 (four) times daily. 56 capsule 0  . dicyclomine (BENTYL) 20 MG tablet Take 20 mg by mouth.    . gabapentin (NEURONTIN) 100 MG capsule TAKE 1 CAPSULE BY MOUTH AT BEDTIME FOR 3 DAYS, THEN TAKE 1 CAPSULE BY MOUTH TWICE (2) DAILY    . metoprolol (LOPRESSOR) 50 MG tablet Take 50 mg by mouth daily.      . mirtazapine (REMERON) 7.5 MG tablet     . omeprazole (PRILOSEC) 20 MG capsule Take 20 mg by mouth daily.      . ondansetron (ZOFRAN) 8 MG tablet     . pantoprazole (PROTONIX) 40 MG tablet     . prochlorperazine (COMPAZINE) 10 MG tablet     . sucralfate (CARAFATE) 1 G tablet Take 1 g by mouth 4 (four)  times daily.       No current facility-administered medications on file prior to visit.    Allergies  Allergen Reactions  . Aspirin Nausea And Vomiting  . Codeine Nausea And Vomiting  . Ibuprofen Nausea And Vomiting  . Penicillins Rash    No results found for this or any previous visit (from the past 2160 hour(s)).   Social History   Social History  . Marital status: Divorced    Spouse name: N/A  . Number of children: 1  . Years of education: 10   Occupational History  . N/A    Social History Main Topics  . Smoking status: Current Every Day Smoker    Packs/day: 1.00    Types: Cigarettes  . Smokeless tobacco: Never Used  . Alcohol use Yes     Comment: Couple of drinks per day  . Drug use: No  . Sexual activity: Not Asked   Other Topics Concern  . None   Social History Narrative   Lives at home w/ her daughter   Right-handed   Caffeine: 1 cup of coffee   Past Surgical History:  Procedure Laterality Date  . LUNG LOBECTOMY  02/23/2006   DR.BURNEY  . REVISION TOTAL HIP ARTHROPLASTY    . THORACOTOMY  02/23/2006   DR.BURNEY  . TIBIA FRACTURE SURGERY     Family  History  Problem Relation Age of Onset  . Liver cancer Mother   . Lung cancer Father     Objective: There were no vitals filed for this visit.  General: Patient is awake, alert, oriented x 3 and in no acute distress.  Dermatology: Skin is warm and dry bilateral with a soft callus plantar and medial right 2nd toe with no opening or drainage, No other acute signs of infection.   Vascular: Dorsalis Pedis pulse = 1/4 Bilateral,  Posterior Tibial pulse = 0/4 Bilateral,  Capillary Fill Time < 5 seconds, varicosities and purple discoloration to toes  Neurologic: Gross sensation present via light touch  Musculosketal: There is severe bunion with crossover deformity and lesser hammertoe right>left with digital subluxation, There is pain with palpation to flail right 2nd toe. No pain with compression to  calves bilateral.   Assessment and Plan:  Problem List Items Addressed This Visit    None    Visit Diagnoses    Chronic toe ulcer, right, limited to breakdown of skin (HCC)    -  Primary   Flail toe       Right foot pain       History of fibromyalgia       Chronic pain disorder       Current smoker       Pre-op exam         -Examined patient and discussed the progression of the wound which is now healed and treatment alternatives. -Previous Xrays reviewed -Patient opt for surgical management. Consent obtained for right 2nd toe amputation. Pre and Post op course explained. Risks, benefits, alternatives explained. No guarantees given or implied. Surgical booking slip submitted and provided patient with Surgical packet and info for [facility] surgical center -Dispensed surgical shoe to use post op -Encouraged smoking cessation  -Patient to return to office after surgery for follow up care and evaluation or sooner if problems arise.  Landis Martins, DPM

## 2016-04-08 ENCOUNTER — Ambulatory Visit: Payer: Commercial Managed Care - HMO | Admitting: Neurology

## 2016-04-24 DIAGNOSIS — I10 Essential (primary) hypertension: Secondary | ICD-10-CM | POA: Diagnosis not present

## 2016-04-24 DIAGNOSIS — Z79899 Other long term (current) drug therapy: Secondary | ICD-10-CM | POA: Diagnosis not present

## 2016-04-24 DIAGNOSIS — Z5181 Encounter for therapeutic drug level monitoring: Secondary | ICD-10-CM | POA: Diagnosis not present

## 2016-04-24 DIAGNOSIS — G8929 Other chronic pain: Secondary | ICD-10-CM | POA: Diagnosis not present

## 2016-05-22 DIAGNOSIS — F331 Major depressive disorder, recurrent, moderate: Secondary | ICD-10-CM | POA: Diagnosis not present

## 2016-05-22 DIAGNOSIS — R110 Nausea: Secondary | ICD-10-CM | POA: Diagnosis not present

## 2016-05-22 DIAGNOSIS — F419 Anxiety disorder, unspecified: Secondary | ICD-10-CM | POA: Diagnosis not present

## 2016-05-22 DIAGNOSIS — G894 Chronic pain syndrome: Secondary | ICD-10-CM | POA: Diagnosis not present

## 2016-06-03 ENCOUNTER — Ambulatory Visit (INDEPENDENT_AMBULATORY_CARE_PROVIDER_SITE_OTHER): Payer: PPO | Admitting: Sports Medicine

## 2016-06-03 ENCOUNTER — Encounter: Payer: Self-pay | Admitting: Sports Medicine

## 2016-06-03 DIAGNOSIS — M79671 Pain in right foot: Secondary | ICD-10-CM | POA: Diagnosis not present

## 2016-06-03 DIAGNOSIS — M2528 Flail joint, other site: Secondary | ICD-10-CM | POA: Diagnosis not present

## 2016-06-03 DIAGNOSIS — L97511 Non-pressure chronic ulcer of other part of right foot limited to breakdown of skin: Secondary | ICD-10-CM | POA: Diagnosis not present

## 2016-06-03 DIAGNOSIS — Z8739 Personal history of other diseases of the musculoskeletal system and connective tissue: Secondary | ICD-10-CM

## 2016-06-03 DIAGNOSIS — G894 Chronic pain syndrome: Secondary | ICD-10-CM | POA: Diagnosis not present

## 2016-06-03 MED ORDER — TRIAMCINOLONE ACETONIDE 10 MG/ML IJ SUSP: 10.0000 mg | Freq: Once | INTRAMUSCULAR | Status: DC

## 2016-06-03 NOTE — Progress Notes (Signed)
Subjective: Courtney Grant is a 83 y.o. female patient seen in office for follow up evaluation of right 2nd toe pain and ulceration. Patient states that there is no opening or drainage. Took all her antibiotics and wants to discuss removing the toe again. However, daughter states that she wanted to make sure there was no infection going on before they schedule surgery. States that they did not schedule surgery last time because the daughter herself had to have surgery. Patient has no other pedal complaints at this time.  Patient Active Problem List   Diagnosis Date Noted  . Abdominal pain 01/29/2016  . Altered gait 01/29/2016  . Arthritis 01/29/2016  . Biliary dyskinesia 01/29/2016  . Cancer ([place]) 01/29/2016  . Depression 01/29/2016  . Dizziness 01/29/2016  . Fibromyalgia 01/29/2016  . Hiatal hernia with GERD 01/29/2016  . Hypertension 01/29/2016  . Nausea 01/29/2016  . PONV (postoperative nausea and vomiting) 01/29/2016  . Pulmonary embolism ([place]) 01/29/2016  . Shortness of breath 01/29/2016  . Stroke ([place]) 01/29/2016  . Tongue thick 01/29/2016  . Urgency incontinence 01/29/2016  . Benign paroxysmal positional vertigo 10/25/2015  . Venous thrombosis   . Chronic obstructive pulmonary disease ([place])   . Tobacco abuse   . Cancer of lung ([place])   . NONSPECIFIC ABN FINDING RAD & OTH EXAM GI TRACT 08/20/2009   Current Outpatient Prescriptions on File Prior to Visit  Medication Sig Dispense Refill  . clindamycin (CLEOCIN) 300 MG capsule Take 1 capsule (300 mg total) by mouth 4 (four) times daily. 56 capsule 0  . dicyclomine (BENTYL) 20 MG tablet Take 20 mg by mouth.    . gabapentin (NEURONTIN) 100 MG capsule TAKE 1 CAPSULE BY MOUTH AT BEDTIME FOR 3 DAYS, THEN TAKE 1 CAPSULE BY MOUTH TWICE (2) DAILY    . metoprolol (LOPRESSOR) 50 MG tablet Take 50 mg by mouth daily.      . mirtazapine (REMERON) 7.5 MG tablet     . omeprazole (PRILOSEC) 20 MG capsule Take 20 mg by mouth daily.      .  ondansetron (ZOFRAN) 8 MG tablet     . pantoprazole (PROTONIX) 40 MG tablet     . prochlorperazine (COMPAZINE) 10 MG tablet     . sucralfate (CARAFATE) 1 G tablet Take 1 g by mouth 4 (four) times daily.       No current facility-administered medications on file prior to visit.    Allergies  Allergen Reactions  . Aspirin Nausea And Vomiting  . Codeine Nausea And Vomiting  . Ibuprofen Nausea And Vomiting  . Penicillins Rash    No results found for this or any previous visit (from the past 2160 hour(s)).   Social History   Social History  . Marital status: Divorced    Spouse name: N/A  . Number of children: 1  . Years of education: 10   Occupational History  . N/A    Social History Main Topics  . Smoking status: Current Every Day Smoker    Packs/day: 1.00    Types: Cigarettes  . Smokeless tobacco: Never Used  . Alcohol use Yes     Comment: Couple of drinks per day  . Drug use: No  . Sexual activity: Not Asked   Other Topics Concern  . None   Social History Narrative   Lives at home w/ her daughter   Right-handed   Caffeine: 1 cup of coffee   Past Surgical History:  Procedure Laterality Date  . LUNG LOBECTOMY  02/23/2006   DR.BURNEY  . REVISION TOTAL HIP ARTHROPLASTY    . THORACOTOMY  02/23/2006   DR.BURNEY  . TIBIA FRACTURE SURGERY     Family History  Problem Relation Age of Onset  . Liver cancer Mother   . Lung cancer Father     Objective: There were no vitals filed for this visit.  General: Patient is awake, alert, oriented x 3 and in no acute distress.  Dermatology: Skin is warm and dry bilateral with a partial ulceration plantar right 2nd toe measures 0.3x0.2cm. No other acute signs of infection.   Vascular: Dorsalis Pedis pulse = 1/4 Bilateral,  Posterior Tibial pulse = 0/4 Bilateral,  Capillary Fill Time < 5 seconds, varicosities and purple discoloration to toes  Neurologic: Gross sensation present via light touch  Musculosketal: There is  severe bunion with crossover deformity and lesser hammertoe right>left with digital subluxation with pain to lesser toe , There is pain with palpation to flail right 2nd toe. No pain with compression to calves bilateral.   Assessment and Plan:  Problem List Items Addressed This Visit    None    Visit Diagnoses    Chronic toe ulcer, right, limited to breakdown of skin (HCC)    -  Primary   Flail toe       Right foot pain       Relevant Medications   triamcinolone acetonide (KENALOG) 10 MG/ML injection 10 mg (Start on 06/03/2016  1:30 PM)   History of fibromyalgia       Relevant Medications   triamcinolone acetonide (KENALOG) 10 MG/ML injection 10 mg (Start on 06/03/2016  1:30 PM)   Chronic pain disorder       Relevant Medications   triamcinolone acetonide (KENALOG) 10 MG/ML injection 10 mg (Start on 06/03/2016  1:30 PM)     -Examined patient and discussed the progression of the wound which is recuurent, However, with no infection and treatment alternatives. -Previous Xrays reviewed After oral consent and aseptic prep, injected a mixture containing 1 ml of 2% plain lidocaine, 1 ml 0.5% plain marcaine, 0.5 ml of kenalog 10 and 0.5 ml of dexamethasone phosphate into right second toe joint for pain without complication. Post-injection care discussed with patient.  -Recommend, protective Band-Aid and antibiotic cream to plantar aspect of right second toe, Meanwhile.  -Patient opt for surgical management again. Consent previously obtained in January for right 2nd toe amputation. Pre and Post op course explained. Risks, benefits, alternatives explained. No guarantees given or implied. Patient's daughter to call Delydia to get surgery scheduled at [facility] surgical center. Patient's daughter reports that they would like to have surgery sometime in April. -Advised them to bring with them the surgical shoe to use post op. As previously dispensed  -Patient to return to office after surgery for follow up  care and evaluation or sooner if problems arise.  Landis Martins, DPM

## 2016-06-03 NOTE — Patient Instructions (Signed)
Pre-Operative Instructions  Congratulations, you have decided to take an important step to improving your quality of life.  You can be assured that the doctors of [facility] will be with you every step of the way.  1. Plan to be at the surgery center/hospital at least 1 (one) hour prior to your scheduled time unless otherwise directed by the surgical center/hospital staff.  You must have a responsible adult accompany you, remain during the surgery and drive you home.  Make sure you have directions to the surgical center/hospital and know how to get there on time. 2. For hospital based surgery you will need to obtain a history and physical form from your family physician within 1 month prior to the date of surgery- we will give you a form for you primary physician.  3. We make every effort to accommodate the date you request for surgery.  There are however, times where surgery dates or times have to be moved.  We will contact you as soon as possible if a change in schedule is required.   4. No Aspirin/Ibuprofen for one week before surgery.  If you are on aspirin, any non-steroidal anti-inflammatory medications (Mobic, Aleve, Ibuprofen) you should stop taking it 7 days prior to your surgery.  You make take Tylenol  For pain prior to surgery.  5. Medications- If you are taking daily heart and blood pressure medications, seizure, reflux, allergy, asthma, anxiety, pain or diabetes medications, make sure the surgery center/hospital is aware before the day of surgery so they may notify you which medications to take or avoid the day of surgery. 6. No food or drink after midnight the night before surgery unless directed otherwise by surgical center/hospital staff. 7. No alcoholic beverages 24 hours prior to surgery.  No smoking 24 hours prior to or 24 hours after surgery. 8. Wear loose pants or shorts- loose enough to fit over bandages, boots, and casts. 9. No slip on shoes, sneakers are best. 10. Bring  your boot with you to the surgery center/hospital.  Also bring crutches or a walker if your physician has prescribed it for you.  If you do not have this equipment, it will be provided for you after surgery. 11. If you have not been contracted by the surgery center/hospital by the day before your surgery, call to confirm the date and time of your surgery. 12. Leave-time from work may vary depending on the type of surgery you have.  Appropriate arrangements should be made prior to surgery with your employer. 13. Prescriptions will be provided immediately following surgery by your doctor.  Have these filled as soon as possible after surgery and take the medication as directed. 14. Remove nail polish on the operative foot. 15. Wash the night before surgery.  The night before surgery wash the foot and leg well with the antibacterial soap provided and water paying special attention to beneath the toenails and in between the toes.  Rinse thoroughly with water and dry well with a towel.  Perform this wash unless told not to do so by your physician.  Enclosed: 1 Ice pack (please put in freezer the night before surgery)   1 Hibiclens skin cleaner   Pre-op Instructions  If you have any questions regarding the instructions, do not hesitate to call our office.  [place]: [address] [place], [place] [address] [phone]  [place]: [address] [place], [place] [address] [phone]  [place]: [address]  [place], [place] [address] [phone]   Dr.   Norman Regal DPM, Dr. Matthew Wagoner DPM, Dr. M. Todd Hyatt DPM, Dr. Shaquille Janes DPM 

## 2016-06-08 ENCOUNTER — Telehealth: Payer: Self-pay | Admitting: *Deleted

## 2016-06-08 NOTE — Telephone Encounter (Signed)
"  I'm Courtney Grant daughter.  Dr. Cannon Kettle is going to do surgery on mom.  She told me to call you and get that scheduled.  Please give me a call."

## 2016-06-09 ENCOUNTER — Telehealth: Payer: Self-pay

## 2016-06-09 NOTE — Telephone Encounter (Signed)
Patient's daughter called to schedule surgery. Tentatively schedule for 06-22-16. Daughter asked if we received clearance letter from PCP, informed her that we do not have clearance letter. She stated that she would call and have them fax it over again.

## 2016-06-15 DIAGNOSIS — R1012 Left upper quadrant pain: Secondary | ICD-10-CM | POA: Diagnosis not present

## 2016-06-15 DIAGNOSIS — R1010 Upper abdominal pain, unspecified: Secondary | ICD-10-CM | POA: Diagnosis not present

## 2016-06-15 DIAGNOSIS — R079 Chest pain, unspecified: Secondary | ICD-10-CM | POA: Diagnosis not present

## 2016-06-15 DIAGNOSIS — R109 Unspecified abdominal pain: Secondary | ICD-10-CM | POA: Diagnosis not present

## 2016-06-17 NOTE — Telephone Encounter (Signed)
Patient was scheduled for 06/22/2016.

## 2016-06-18 ENCOUNTER — Telehealth: Payer: Self-pay | Admitting: *Deleted

## 2016-06-18 DIAGNOSIS — R112 Nausea with vomiting, unspecified: Secondary | ICD-10-CM | POA: Diagnosis not present

## 2016-06-18 DIAGNOSIS — R1013 Epigastric pain: Secondary | ICD-10-CM | POA: Diagnosis not present

## 2016-06-18 NOTE — Telephone Encounter (Signed)
"  This is Courtney Grant, Alexina Andrew's daughter.  She's scheduled for surgery on Monday, April 9.  She is sick.  We are trying to figure out what's going on.  So, we'd like to postpone it to April 16.  Please give me a call."

## 2016-06-19 DIAGNOSIS — K2270 Barrett's esophagus without dysplasia: Secondary | ICD-10-CM | POA: Diagnosis not present

## 2016-06-19 DIAGNOSIS — K3189 Other diseases of stomach and duodenum: Secondary | ICD-10-CM | POA: Diagnosis not present

## 2016-06-19 DIAGNOSIS — K210 Gastro-esophageal reflux disease with esophagitis: Secondary | ICD-10-CM | POA: Diagnosis not present

## 2016-06-19 DIAGNOSIS — K5520 Angiodysplasia of colon without hemorrhage: Secondary | ICD-10-CM | POA: Diagnosis not present

## 2016-06-19 DIAGNOSIS — R1013 Epigastric pain: Secondary | ICD-10-CM | POA: Diagnosis not present

## 2016-06-19 DIAGNOSIS — K209 Esophagitis, unspecified: Secondary | ICD-10-CM | POA: Diagnosis not present

## 2016-06-19 DIAGNOSIS — K2960 Other gastritis without bleeding: Secondary | ICD-10-CM | POA: Diagnosis not present

## 2016-06-19 DIAGNOSIS — R112 Nausea with vomiting, unspecified: Secondary | ICD-10-CM | POA: Diagnosis not present

## 2016-06-19 NOTE — Telephone Encounter (Signed)
I have rescheduled patient's post-op appointments to reflect that her surgery was rescheduled to Monday 16 April.

## 2016-06-19 NOTE — Telephone Encounter (Signed)
"  I called and left you a message yesterday."  Yes, I received your message.  April 16 is available, I will get it rescheduled.  "Okay good, she is having that light done today to figure out what's going on."  What is a light?  "She's having a test where they put that light down her throat to see if she has any problems.  She has been deathly ill.  They are trying to figure out if she has a stone blocking her bowel duct.  She's also having some problems with her ear too.  So we are trying to take care of all this before she has her foot surgery."  Okay, I will get her rescheduled and let Dr. Cannon Kettle know.  I called and rescheduled surgery at the surgical center from 06/22/2016 to 06/29/2016.

## 2016-06-25 ENCOUNTER — Telehealth: Payer: Self-pay | Admitting: *Deleted

## 2016-06-25 NOTE — Telephone Encounter (Signed)
"  This is Courtney Grant, [name]'s daughter.  I am returning your call.  If you want to call me back, give me a call on my cell phone, [phone]."  I'm calling you back.  The surgical center is requesting an updated history and physical form.  The one they have has expired.  "Well, we were going to have to cancel anyway.  They still haven't figured out what's wrong with mom yet.  They think she has gall stones trapped in her bile duct but she doesn't have a gall bladder anymore.  So, they are trying to figure it out.  I'll be glad when they do because she wakes up every morning sick.  We'll call back later to reschedule once we get this taken care of."  I will cancel the surgery and I hope she feels better soon.  I called [facility] and canceled her surgery.

## 2016-07-01 ENCOUNTER — Encounter: Payer: PPO | Admitting: Sports Medicine

## 2016-07-01 DIAGNOSIS — I700 Atherosclerosis of aorta: Secondary | ICD-10-CM | POA: Diagnosis not present

## 2016-07-01 DIAGNOSIS — K838 Other specified diseases of biliary tract: Secondary | ICD-10-CM | POA: Diagnosis not present

## 2016-07-01 DIAGNOSIS — R935 Abnormal findings on diagnostic imaging of other abdominal regions, including retroperitoneum: Secondary | ICD-10-CM | POA: Diagnosis not present

## 2016-07-01 DIAGNOSIS — K8050 Calculus of bile duct without cholangitis or cholecystitis without obstruction: Secondary | ICD-10-CM | POA: Diagnosis not present

## 2016-07-01 DIAGNOSIS — N281 Cyst of kidney, acquired: Secondary | ICD-10-CM | POA: Diagnosis not present

## 2016-07-02 DIAGNOSIS — K8050 Calculus of bile duct without cholangitis or cholecystitis without obstruction: Secondary | ICD-10-CM | POA: Diagnosis not present

## 2016-07-06 DIAGNOSIS — H8110 Benign paroxysmal vertigo, unspecified ear: Secondary | ICD-10-CM | POA: Diagnosis not present

## 2016-07-06 DIAGNOSIS — J310 Chronic rhinitis: Secondary | ICD-10-CM | POA: Diagnosis not present

## 2016-07-06 DIAGNOSIS — H903 Sensorineural hearing loss, bilateral: Secondary | ICD-10-CM | POA: Diagnosis not present

## 2016-07-06 DIAGNOSIS — H9319 Tinnitus, unspecified ear: Secondary | ICD-10-CM | POA: Diagnosis not present

## 2016-07-06 DIAGNOSIS — R42 Dizziness and giddiness: Secondary | ICD-10-CM | POA: Diagnosis not present

## 2016-07-06 DIAGNOSIS — J3489 Other specified disorders of nose and nasal sinuses: Secondary | ICD-10-CM | POA: Diagnosis not present

## 2016-07-08 ENCOUNTER — Encounter: Payer: PPO | Admitting: Sports Medicine

## 2016-07-15 ENCOUNTER — Encounter: Payer: PPO | Admitting: Sports Medicine

## 2016-07-16 DIAGNOSIS — R112 Nausea with vomiting, unspecified: Secondary | ICD-10-CM | POA: Diagnosis not present

## 2016-07-16 DIAGNOSIS — R1013 Epigastric pain: Secondary | ICD-10-CM | POA: Diagnosis not present

## 2016-07-17 DIAGNOSIS — F419 Anxiety disorder, unspecified: Secondary | ICD-10-CM | POA: Diagnosis not present

## 2016-07-17 DIAGNOSIS — R1011 Right upper quadrant pain: Secondary | ICD-10-CM | POA: Diagnosis not present

## 2016-07-17 DIAGNOSIS — F331 Major depressive disorder, recurrent, moderate: Secondary | ICD-10-CM | POA: Diagnosis not present

## 2016-07-17 DIAGNOSIS — G894 Chronic pain syndrome: Secondary | ICD-10-CM | POA: Diagnosis not present

## 2016-07-23 DIAGNOSIS — M25551 Pain in right hip: Secondary | ICD-10-CM | POA: Diagnosis not present

## 2016-07-23 DIAGNOSIS — S72409A Unspecified fracture of lower end of unspecified femur, initial encounter for closed fracture: Secondary | ICD-10-CM | POA: Diagnosis not present

## 2016-07-23 DIAGNOSIS — S72491A Other fracture of lower end of right femur, initial encounter for closed fracture: Secondary | ICD-10-CM | POA: Diagnosis not present

## 2016-07-23 DIAGNOSIS — S72401A Unspecified fracture of lower end of right femur, initial encounter for closed fracture: Secondary | ICD-10-CM | POA: Diagnosis not present

## 2016-07-23 DIAGNOSIS — E785 Hyperlipidemia, unspecified: Secondary | ICD-10-CM | POA: Diagnosis not present

## 2016-07-23 DIAGNOSIS — S79911A Unspecified injury of right hip, initial encounter: Secondary | ICD-10-CM | POA: Diagnosis not present

## 2016-07-23 DIAGNOSIS — Z95828 Presence of other vascular implants and grafts: Secondary | ICD-10-CM | POA: Diagnosis not present

## 2016-07-23 DIAGNOSIS — Y92009 Unspecified place in unspecified non-institutional (private) residence as the place of occurrence of the external cause: Secondary | ICD-10-CM | POA: Diagnosis not present

## 2016-07-23 DIAGNOSIS — M1990 Unspecified osteoarthritis, unspecified site: Secondary | ICD-10-CM | POA: Diagnosis not present

## 2016-07-23 DIAGNOSIS — W19XXXA Unspecified fall, initial encounter: Secondary | ICD-10-CM | POA: Diagnosis not present

## 2016-07-23 DIAGNOSIS — M96661 Fracture of femur following insertion of orthopedic implant, joint prosthesis, or bone plate, right leg: Secondary | ICD-10-CM | POA: Diagnosis not present

## 2016-07-23 DIAGNOSIS — Z888 Allergy status to other drugs, medicaments and biological substances status: Secondary | ICD-10-CM | POA: Diagnosis not present

## 2016-07-23 DIAGNOSIS — M9701XA Periprosthetic fracture around internal prosthetic right hip joint, initial encounter: Secondary | ICD-10-CM | POA: Diagnosis not present

## 2016-07-23 DIAGNOSIS — F17210 Nicotine dependence, cigarettes, uncomplicated: Secondary | ICD-10-CM | POA: Diagnosis not present

## 2016-07-23 DIAGNOSIS — R55 Syncope and collapse: Secondary | ICD-10-CM | POA: Diagnosis not present

## 2016-07-23 DIAGNOSIS — F418 Other specified anxiety disorders: Secondary | ICD-10-CM | POA: Diagnosis not present

## 2016-07-23 DIAGNOSIS — M25561 Pain in right knee: Secondary | ICD-10-CM | POA: Diagnosis not present

## 2016-07-23 DIAGNOSIS — M79661 Pain in right lower leg: Secondary | ICD-10-CM | POA: Diagnosis not present

## 2016-07-23 DIAGNOSIS — I10 Essential (primary) hypertension: Secondary | ICD-10-CM | POA: Diagnosis not present

## 2016-07-23 DIAGNOSIS — R52 Pain, unspecified: Secondary | ICD-10-CM | POA: Diagnosis not present

## 2016-07-23 DIAGNOSIS — S79912A Unspecified injury of left hip, initial encounter: Secondary | ICD-10-CM | POA: Diagnosis not present

## 2016-07-23 DIAGNOSIS — Z79899 Other long term (current) drug therapy: Secondary | ICD-10-CM | POA: Diagnosis not present

## 2016-07-23 DIAGNOSIS — M79651 Pain in right thigh: Secondary | ICD-10-CM | POA: Diagnosis not present

## 2016-07-23 DIAGNOSIS — S8991XA Unspecified injury of right lower leg, initial encounter: Secondary | ICD-10-CM | POA: Diagnosis not present

## 2016-07-23 DIAGNOSIS — S299XXA Unspecified injury of thorax, initial encounter: Secondary | ICD-10-CM | POA: Diagnosis not present

## 2016-07-23 DIAGNOSIS — Z885 Allergy status to narcotic agent status: Secondary | ICD-10-CM | POA: Diagnosis not present

## 2016-07-23 DIAGNOSIS — Z86711 Personal history of pulmonary embolism: Secondary | ICD-10-CM | POA: Diagnosis not present

## 2016-07-23 DIAGNOSIS — K219 Gastro-esophageal reflux disease without esophagitis: Secondary | ICD-10-CM | POA: Diagnosis not present

## 2016-07-23 DIAGNOSIS — D649 Anemia, unspecified: Secondary | ICD-10-CM | POA: Diagnosis not present

## 2016-07-23 DIAGNOSIS — J449 Chronic obstructive pulmonary disease, unspecified: Secondary | ICD-10-CM | POA: Diagnosis not present

## 2016-07-23 DIAGNOSIS — I951 Orthostatic hypotension: Secondary | ICD-10-CM | POA: Diagnosis not present

## 2016-07-24 DIAGNOSIS — Z01810 Encounter for preprocedural cardiovascular examination: Secondary | ICD-10-CM | POA: Diagnosis not present

## 2016-07-24 DIAGNOSIS — R55 Syncope and collapse: Secondary | ICD-10-CM | POA: Diagnosis not present

## 2016-07-25 DIAGNOSIS — M96661 Fracture of femur following insertion of orthopedic implant, joint prosthesis, or bone plate, right leg: Secondary | ICD-10-CM | POA: Diagnosis not present

## 2016-07-25 DIAGNOSIS — S72401A Unspecified fracture of lower end of right femur, initial encounter for closed fracture: Secondary | ICD-10-CM | POA: Diagnosis not present

## 2016-07-27 DIAGNOSIS — S72401D Unspecified fracture of lower end of right femur, subsequent encounter for closed fracture with routine healing: Secondary | ICD-10-CM | POA: Diagnosis not present

## 2016-07-27 DIAGNOSIS — G8911 Acute pain due to trauma: Secondary | ICD-10-CM | POA: Diagnosis not present

## 2016-07-27 DIAGNOSIS — F17210 Nicotine dependence, cigarettes, uncomplicated: Secondary | ICD-10-CM | POA: Diagnosis not present

## 2016-07-27 DIAGNOSIS — J449 Chronic obstructive pulmonary disease, unspecified: Secondary | ICD-10-CM | POA: Diagnosis not present

## 2016-07-27 DIAGNOSIS — S72401A Unspecified fracture of lower end of right femur, initial encounter for closed fracture: Secondary | ICD-10-CM | POA: Diagnosis not present

## 2016-07-27 DIAGNOSIS — I10 Essential (primary) hypertension: Secondary | ICD-10-CM | POA: Diagnosis not present

## 2016-07-27 DIAGNOSIS — E785 Hyperlipidemia, unspecified: Secondary | ICD-10-CM | POA: Diagnosis not present

## 2016-07-27 DIAGNOSIS — S728X9A Other fracture of unspecified femur, initial encounter for closed fracture: Secondary | ICD-10-CM | POA: Diagnosis not present

## 2016-07-27 DIAGNOSIS — Z86711 Personal history of pulmonary embolism: Secondary | ICD-10-CM | POA: Diagnosis not present

## 2016-07-27 DIAGNOSIS — Z95828 Presence of other vascular implants and grafts: Secondary | ICD-10-CM | POA: Diagnosis not present

## 2016-07-27 DIAGNOSIS — S93491A Sprain of other ligament of right ankle, initial encounter: Secondary | ICD-10-CM | POA: Diagnosis not present

## 2016-07-27 DIAGNOSIS — S72409A Unspecified fracture of lower end of unspecified femur, initial encounter for closed fracture: Secondary | ICD-10-CM | POA: Diagnosis not present

## 2016-07-27 DIAGNOSIS — W19XXXD Unspecified fall, subsequent encounter: Secondary | ICD-10-CM | POA: Diagnosis not present

## 2016-08-06 DIAGNOSIS — S72401A Unspecified fracture of lower end of right femur, initial encounter for closed fracture: Secondary | ICD-10-CM | POA: Diagnosis not present

## 2016-08-06 DIAGNOSIS — S93491A Sprain of other ligament of right ankle, initial encounter: Secondary | ICD-10-CM | POA: Diagnosis not present

## 2016-09-08 DIAGNOSIS — L89899 Pressure ulcer of other site, unspecified stage: Secondary | ICD-10-CM | POA: Diagnosis not present

## 2016-09-08 DIAGNOSIS — S72401A Unspecified fracture of lower end of right femur, initial encounter for closed fracture: Secondary | ICD-10-CM | POA: Diagnosis not present

## 2016-09-09 DIAGNOSIS — L89892 Pressure ulcer of other site, stage 2: Secondary | ICD-10-CM | POA: Diagnosis not present

## 2016-09-09 DIAGNOSIS — J449 Chronic obstructive pulmonary disease, unspecified: Secondary | ICD-10-CM | POA: Diagnosis not present

## 2016-09-09 DIAGNOSIS — G629 Polyneuropathy, unspecified: Secondary | ICD-10-CM | POA: Diagnosis not present

## 2016-09-09 DIAGNOSIS — M1990 Unspecified osteoarthritis, unspecified site: Secondary | ICD-10-CM | POA: Diagnosis not present

## 2016-09-09 DIAGNOSIS — Z9889 Other specified postprocedural states: Secondary | ICD-10-CM | POA: Diagnosis not present

## 2016-09-09 DIAGNOSIS — Z86718 Personal history of other venous thrombosis and embolism: Secondary | ICD-10-CM | POA: Diagnosis not present

## 2016-09-17 DIAGNOSIS — L89892 Pressure ulcer of other site, stage 2: Secondary | ICD-10-CM | POA: Diagnosis not present

## 2016-09-24 DIAGNOSIS — M1711 Unilateral primary osteoarthritis, right knee: Secondary | ICD-10-CM | POA: Diagnosis not present

## 2016-09-24 DIAGNOSIS — M25571 Pain in right ankle and joints of right foot: Secondary | ICD-10-CM | POA: Diagnosis not present

## 2016-09-24 DIAGNOSIS — S72401A Unspecified fracture of lower end of right femur, initial encounter for closed fracture: Secondary | ICD-10-CM | POA: Diagnosis not present

## 2016-09-24 DIAGNOSIS — L89899 Pressure ulcer of other site, unspecified stage: Secondary | ICD-10-CM | POA: Diagnosis not present

## 2016-09-25 DIAGNOSIS — L89892 Pressure ulcer of other site, stage 2: Secondary | ICD-10-CM | POA: Diagnosis not present

## 2016-09-29 DIAGNOSIS — L89512 Pressure ulcer of right ankle, stage 2: Secondary | ICD-10-CM | POA: Diagnosis not present

## 2016-09-29 DIAGNOSIS — L97909 Non-pressure chronic ulcer of unspecified part of unspecified lower leg with unspecified severity: Secondary | ICD-10-CM | POA: Diagnosis not present

## 2016-10-04 DIAGNOSIS — Z791 Long term (current) use of non-steroidal anti-inflammatories (NSAID): Secondary | ICD-10-CM | POA: Diagnosis not present

## 2016-10-04 DIAGNOSIS — Z85118 Personal history of other malignant neoplasm of bronchus and lung: Secondary | ICD-10-CM | POA: Diagnosis not present

## 2016-10-04 DIAGNOSIS — Z902 Acquired absence of lung [part of]: Secondary | ICD-10-CM | POA: Diagnosis not present

## 2016-10-04 DIAGNOSIS — J9611 Chronic respiratory failure with hypoxia: Secondary | ICD-10-CM | POA: Diagnosis not present

## 2016-10-04 DIAGNOSIS — M797 Fibromyalgia: Secondary | ICD-10-CM | POA: Diagnosis not present

## 2016-10-04 DIAGNOSIS — Z95828 Presence of other vascular implants and grafts: Secondary | ICD-10-CM | POA: Diagnosis not present

## 2016-10-04 DIAGNOSIS — Z8673 Personal history of transient ischemic attack (TIA), and cerebral infarction without residual deficits: Secondary | ICD-10-CM | POA: Diagnosis not present

## 2016-10-04 DIAGNOSIS — M1711 Unilateral primary osteoarthritis, right knee: Secondary | ICD-10-CM | POA: Diagnosis not present

## 2016-10-04 DIAGNOSIS — Z79891 Long term (current) use of opiate analgesic: Secondary | ICD-10-CM | POA: Diagnosis not present

## 2016-10-04 DIAGNOSIS — F17210 Nicotine dependence, cigarettes, uncomplicated: Secondary | ICD-10-CM | POA: Diagnosis not present

## 2016-10-04 DIAGNOSIS — Z96643 Presence of artificial hip joint, bilateral: Secondary | ICD-10-CM | POA: Diagnosis not present

## 2016-10-04 DIAGNOSIS — L89892 Pressure ulcer of other site, stage 2: Secondary | ICD-10-CM | POA: Diagnosis not present

## 2016-10-04 DIAGNOSIS — I119 Hypertensive heart disease without heart failure: Secondary | ICD-10-CM | POA: Diagnosis not present

## 2016-10-04 DIAGNOSIS — Z86718 Personal history of other venous thrombosis and embolism: Secondary | ICD-10-CM | POA: Diagnosis not present

## 2016-10-04 DIAGNOSIS — M9701XD Periprosthetic fracture around internal prosthetic right hip joint, subsequent encounter: Secondary | ICD-10-CM | POA: Diagnosis not present

## 2016-10-04 DIAGNOSIS — H8110 Benign paroxysmal vertigo, unspecified ear: Secondary | ICD-10-CM | POA: Diagnosis not present

## 2016-10-04 DIAGNOSIS — J439 Emphysema, unspecified: Secondary | ICD-10-CM | POA: Diagnosis not present

## 2016-10-04 DIAGNOSIS — Z86711 Personal history of pulmonary embolism: Secondary | ICD-10-CM | POA: Diagnosis not present

## 2016-10-07 DIAGNOSIS — M85861 Other specified disorders of bone density and structure, right lower leg: Secondary | ICD-10-CM | POA: Diagnosis not present

## 2016-10-07 DIAGNOSIS — L89892 Pressure ulcer of other site, stage 2: Secondary | ICD-10-CM | POA: Diagnosis not present

## 2016-10-07 DIAGNOSIS — L89894 Pressure ulcer of other site, stage 4: Secondary | ICD-10-CM | POA: Diagnosis not present

## 2016-10-07 DIAGNOSIS — M79671 Pain in right foot: Secondary | ICD-10-CM | POA: Diagnosis not present

## 2016-10-07 DIAGNOSIS — L97519 Non-pressure chronic ulcer of other part of right foot with unspecified severity: Secondary | ICD-10-CM | POA: Diagnosis not present

## 2016-10-07 DIAGNOSIS — M85871 Other specified disorders of bone density and structure, right ankle and foot: Secondary | ICD-10-CM | POA: Diagnosis not present

## 2016-10-07 DIAGNOSIS — L97919 Non-pressure chronic ulcer of unspecified part of right lower leg with unspecified severity: Secondary | ICD-10-CM | POA: Diagnosis not present

## 2016-10-08 DIAGNOSIS — M79669 Pain in unspecified lower leg: Secondary | ICD-10-CM | POA: Diagnosis not present

## 2016-10-08 DIAGNOSIS — M79604 Pain in right leg: Secondary | ICD-10-CM | POA: Diagnosis not present

## 2016-10-08 DIAGNOSIS — M79671 Pain in right foot: Secondary | ICD-10-CM | POA: Diagnosis not present

## 2016-10-08 DIAGNOSIS — R52 Pain, unspecified: Secondary | ICD-10-CM | POA: Diagnosis not present

## 2016-10-12 DIAGNOSIS — I771 Stricture of artery: Secondary | ICD-10-CM | POA: Diagnosis not present

## 2016-10-12 DIAGNOSIS — Z8709 Personal history of other diseases of the respiratory system: Secondary | ICD-10-CM | POA: Diagnosis not present

## 2016-10-12 DIAGNOSIS — R030 Elevated blood-pressure reading, without diagnosis of hypertension: Secondary | ICD-10-CM | POA: Diagnosis not present

## 2016-10-12 DIAGNOSIS — I97622 Postprocedural seroma of a circulatory system organ or structure following other procedure: Secondary | ICD-10-CM | POA: Diagnosis not present

## 2016-10-12 DIAGNOSIS — L98499 Non-pressure chronic ulcer of skin of other sites with unspecified severity: Secondary | ICD-10-CM | POA: Diagnosis not present

## 2016-10-12 DIAGNOSIS — M79604 Pain in right leg: Secondary | ICD-10-CM | POA: Diagnosis not present

## 2016-10-12 DIAGNOSIS — I878 Other specified disorders of veins: Secondary | ICD-10-CM | POA: Diagnosis not present

## 2016-10-12 DIAGNOSIS — I739 Peripheral vascular disease, unspecified: Secondary | ICD-10-CM | POA: Diagnosis not present

## 2016-10-12 DIAGNOSIS — R600 Localized edema: Secondary | ICD-10-CM | POA: Diagnosis not present

## 2016-10-12 DIAGNOSIS — I872 Venous insufficiency (chronic) (peripheral): Secondary | ICD-10-CM | POA: Diagnosis not present

## 2016-10-12 DIAGNOSIS — L97919 Non-pressure chronic ulcer of unspecified part of right lower leg with unspecified severity: Secondary | ICD-10-CM | POA: Diagnosis not present

## 2016-10-13 DIAGNOSIS — Z515 Encounter for palliative care: Secondary | ICD-10-CM | POA: Diagnosis not present

## 2016-10-13 DIAGNOSIS — R402253 Coma scale, best verbal response, oriented, at hospital admission: Secondary | ICD-10-CM | POA: Diagnosis not present

## 2016-10-13 DIAGNOSIS — F17210 Nicotine dependence, cigarettes, uncomplicated: Secondary | ICD-10-CM | POA: Diagnosis not present

## 2016-10-13 DIAGNOSIS — D649 Anemia, unspecified: Secondary | ICD-10-CM | POA: Diagnosis not present

## 2016-10-13 DIAGNOSIS — L97211 Non-pressure chronic ulcer of right calf limited to breakdown of skin: Secondary | ICD-10-CM | POA: Diagnosis not present

## 2016-10-13 DIAGNOSIS — F329 Major depressive disorder, single episode, unspecified: Secondary | ICD-10-CM | POA: Diagnosis not present

## 2016-10-13 DIAGNOSIS — I70203 Unspecified atherosclerosis of native arteries of extremities, bilateral legs: Secondary | ICD-10-CM | POA: Diagnosis not present

## 2016-10-13 DIAGNOSIS — K219 Gastro-esophageal reflux disease without esophagitis: Secondary | ICD-10-CM | POA: Diagnosis not present

## 2016-10-13 DIAGNOSIS — R402363 Coma scale, best motor response, obeys commands, at hospital admission: Secondary | ICD-10-CM | POA: Diagnosis not present

## 2016-10-13 DIAGNOSIS — L97919 Non-pressure chronic ulcer of unspecified part of right lower leg with unspecified severity: Secondary | ICD-10-CM | POA: Diagnosis not present

## 2016-10-13 DIAGNOSIS — Z01818 Encounter for other preprocedural examination: Secondary | ICD-10-CM | POA: Diagnosis not present

## 2016-10-13 DIAGNOSIS — Z902 Acquired absence of lung [part of]: Secondary | ICD-10-CM | POA: Diagnosis not present

## 2016-10-13 DIAGNOSIS — F419 Anxiety disorder, unspecified: Secondary | ICD-10-CM | POA: Diagnosis not present

## 2016-10-13 DIAGNOSIS — I70212 Atherosclerosis of native arteries of extremities with intermittent claudication, left leg: Secondary | ICD-10-CM | POA: Diagnosis not present

## 2016-10-13 DIAGNOSIS — M25571 Pain in right ankle and joints of right foot: Secondary | ICD-10-CM | POA: Diagnosis not present

## 2016-10-13 DIAGNOSIS — M792 Neuralgia and neuritis, unspecified: Secondary | ICD-10-CM | POA: Diagnosis not present

## 2016-10-13 DIAGNOSIS — M79604 Pain in right leg: Secondary | ICD-10-CM | POA: Diagnosis not present

## 2016-10-13 DIAGNOSIS — I708 Atherosclerosis of other arteries: Secondary | ICD-10-CM | POA: Diagnosis not present

## 2016-10-13 DIAGNOSIS — R2689 Other abnormalities of gait and mobility: Secondary | ICD-10-CM | POA: Diagnosis not present

## 2016-10-13 DIAGNOSIS — L97419 Non-pressure chronic ulcer of right heel and midfoot with unspecified severity: Secondary | ICD-10-CM | POA: Diagnosis not present

## 2016-10-13 DIAGNOSIS — I313 Pericardial effusion (noninflammatory): Secondary | ICD-10-CM | POA: Diagnosis not present

## 2016-10-13 DIAGNOSIS — G629 Polyneuropathy, unspecified: Secondary | ICD-10-CM | POA: Diagnosis not present

## 2016-10-13 DIAGNOSIS — M79671 Pain in right foot: Secondary | ICD-10-CM | POA: Diagnosis not present

## 2016-10-13 DIAGNOSIS — J449 Chronic obstructive pulmonary disease, unspecified: Secondary | ICD-10-CM | POA: Diagnosis not present

## 2016-10-13 DIAGNOSIS — I10 Essential (primary) hypertension: Secondary | ICD-10-CM | POA: Diagnosis not present

## 2016-10-13 DIAGNOSIS — Z86711 Personal history of pulmonary embolism: Secondary | ICD-10-CM | POA: Diagnosis not present

## 2016-10-13 DIAGNOSIS — L89899 Pressure ulcer of other site, unspecified stage: Secondary | ICD-10-CM | POA: Diagnosis not present

## 2016-10-13 DIAGNOSIS — G4700 Insomnia, unspecified: Secondary | ICD-10-CM | POA: Diagnosis not present

## 2016-10-13 DIAGNOSIS — M791 Myalgia: Secondary | ICD-10-CM | POA: Diagnosis not present

## 2016-10-13 DIAGNOSIS — Z90710 Acquired absence of both cervix and uterus: Secondary | ICD-10-CM | POA: Diagnosis not present

## 2016-10-13 DIAGNOSIS — M79661 Pain in right lower leg: Secondary | ICD-10-CM | POA: Diagnosis not present

## 2016-10-13 DIAGNOSIS — F17219 Nicotine dependence, cigarettes, with unspecified nicotine-induced disorders: Secondary | ICD-10-CM | POA: Diagnosis not present

## 2016-10-13 DIAGNOSIS — I70231 Atherosclerosis of native arteries of right leg with ulceration of thigh: Secondary | ICD-10-CM | POA: Diagnosis not present

## 2016-10-13 DIAGNOSIS — I70213 Atherosclerosis of native arteries of extremities with intermittent claudication, bilateral legs: Secondary | ICD-10-CM

## 2016-10-13 DIAGNOSIS — R52 Pain, unspecified: Secondary | ICD-10-CM | POA: Diagnosis not present

## 2016-10-13 DIAGNOSIS — R110 Nausea: Secondary | ICD-10-CM | POA: Diagnosis not present

## 2016-10-13 DIAGNOSIS — I70211 Atherosclerosis of native arteries of extremities with intermittent claudication, right leg: Secondary | ICD-10-CM | POA: Diagnosis not present

## 2016-10-13 DIAGNOSIS — I70221 Atherosclerosis of native arteries of extremities with rest pain, right leg: Secondary | ICD-10-CM | POA: Diagnosis not present

## 2016-10-13 DIAGNOSIS — Z85118 Personal history of other malignant neoplasm of bronchus and lung: Secondary | ICD-10-CM | POA: Diagnosis not present

## 2016-10-13 DIAGNOSIS — R402143 Coma scale, eyes open, spontaneous, at hospital admission: Secondary | ICD-10-CM | POA: Diagnosis not present

## 2016-10-13 DIAGNOSIS — I70239 Atherosclerosis of native arteries of right leg with ulceration of unspecified site: Secondary | ICD-10-CM | POA: Diagnosis not present

## 2016-10-13 DIAGNOSIS — I739 Peripheral vascular disease, unspecified: Secondary | ICD-10-CM | POA: Diagnosis not present

## 2016-10-13 DIAGNOSIS — L89893 Pressure ulcer of other site, stage 3: Secondary | ICD-10-CM | POA: Diagnosis not present

## 2016-10-13 DIAGNOSIS — L03115 Cellulitis of right lower limb: Secondary | ICD-10-CM | POA: Diagnosis not present

## 2016-10-13 HISTORY — DX: Atherosclerosis of native arteries of extremities with intermittent claudication, bilateral legs: I70.213

## 2016-10-14 DIAGNOSIS — I70213 Atherosclerosis of native arteries of extremities with intermittent claudication, bilateral legs: Secondary | ICD-10-CM | POA: Diagnosis not present

## 2016-10-21 DIAGNOSIS — L97211 Non-pressure chronic ulcer of right calf limited to breakdown of skin: Secondary | ICD-10-CM | POA: Diagnosis not present

## 2016-10-21 DIAGNOSIS — L89892 Pressure ulcer of other site, stage 2: Secondary | ICD-10-CM | POA: Diagnosis not present

## 2016-10-21 DIAGNOSIS — I70213 Atherosclerosis of native arteries of extremities with intermittent claudication, bilateral legs: Secondary | ICD-10-CM | POA: Diagnosis not present

## 2016-10-21 DIAGNOSIS — F329 Major depressive disorder, single episode, unspecified: Secondary | ICD-10-CM | POA: Diagnosis not present

## 2016-10-21 DIAGNOSIS — M792 Neuralgia and neuritis, unspecified: Secondary | ICD-10-CM | POA: Diagnosis not present

## 2016-10-21 DIAGNOSIS — F419 Anxiety disorder, unspecified: Secondary | ICD-10-CM | POA: Diagnosis not present

## 2016-10-21 DIAGNOSIS — M79674 Pain in right toe(s): Secondary | ICD-10-CM | POA: Diagnosis not present

## 2016-10-21 DIAGNOSIS — S72401K Unspecified fracture of lower end of right femur, subsequent encounter for closed fracture with nonunion: Secondary | ICD-10-CM | POA: Diagnosis not present

## 2016-10-21 DIAGNOSIS — L89894 Pressure ulcer of other site, stage 4: Secondary | ICD-10-CM | POA: Diagnosis not present

## 2016-10-21 DIAGNOSIS — F17219 Nicotine dependence, cigarettes, with unspecified nicotine-induced disorders: Secondary | ICD-10-CM | POA: Diagnosis not present

## 2016-10-21 DIAGNOSIS — M25571 Pain in right ankle and joints of right foot: Secondary | ICD-10-CM | POA: Diagnosis not present

## 2016-10-21 DIAGNOSIS — M25561 Pain in right knee: Secondary | ICD-10-CM | POA: Diagnosis not present

## 2016-10-21 DIAGNOSIS — I10 Essential (primary) hypertension: Secondary | ICD-10-CM | POA: Diagnosis not present

## 2016-10-21 DIAGNOSIS — J449 Chronic obstructive pulmonary disease, unspecified: Secondary | ICD-10-CM | POA: Diagnosis not present

## 2016-10-21 DIAGNOSIS — G4700 Insomnia, unspecified: Secondary | ICD-10-CM | POA: Diagnosis not present

## 2016-10-26 DIAGNOSIS — L89894 Pressure ulcer of other site, stage 4: Secondary | ICD-10-CM | POA: Diagnosis not present

## 2016-10-26 DIAGNOSIS — L89892 Pressure ulcer of other site, stage 2: Secondary | ICD-10-CM | POA: Diagnosis not present

## 2016-10-28 DIAGNOSIS — S72401K Unspecified fracture of lower end of right femur, subsequent encounter for closed fracture with nonunion: Secondary | ICD-10-CM | POA: Diagnosis not present

## 2016-11-03 DIAGNOSIS — L89894 Pressure ulcer of other site, stage 4: Secondary | ICD-10-CM | POA: Diagnosis not present

## 2016-11-03 DIAGNOSIS — L89892 Pressure ulcer of other site, stage 2: Secondary | ICD-10-CM | POA: Diagnosis not present

## 2016-11-06 DIAGNOSIS — S72401K Unspecified fracture of lower end of right femur, subsequent encounter for closed fracture with nonunion: Secondary | ICD-10-CM | POA: Diagnosis not present

## 2016-11-06 DIAGNOSIS — M25561 Pain in right knee: Secondary | ICD-10-CM | POA: Diagnosis not present

## 2016-11-09 DIAGNOSIS — S72401K Unspecified fracture of lower end of right femur, subsequent encounter for closed fracture with nonunion: Secondary | ICD-10-CM | POA: Diagnosis not present

## 2016-11-10 DIAGNOSIS — L89894 Pressure ulcer of other site, stage 4: Secondary | ICD-10-CM | POA: Diagnosis not present

## 2016-11-10 DIAGNOSIS — L89892 Pressure ulcer of other site, stage 2: Secondary | ICD-10-CM | POA: Diagnosis not present

## 2016-11-11 DIAGNOSIS — I739 Peripheral vascular disease, unspecified: Secondary | ICD-10-CM | POA: Diagnosis not present

## 2016-11-11 DIAGNOSIS — Z95828 Presence of other vascular implants and grafts: Secondary | ICD-10-CM | POA: Diagnosis not present

## 2016-11-12 DIAGNOSIS — M19071 Primary osteoarthritis, right ankle and foot: Secondary | ICD-10-CM | POA: Diagnosis not present

## 2016-11-12 DIAGNOSIS — M79671 Pain in right foot: Secondary | ICD-10-CM | POA: Diagnosis not present

## 2016-11-16 DIAGNOSIS — E708 Other disorders of aromatic amino-acid metabolism: Secondary | ICD-10-CM | POA: Diagnosis not present

## 2016-11-16 DIAGNOSIS — K7290 Hepatic failure, unspecified without coma: Secondary | ICD-10-CM | POA: Diagnosis not present

## 2016-11-17 DIAGNOSIS — L89894 Pressure ulcer of other site, stage 4: Secondary | ICD-10-CM | POA: Diagnosis not present

## 2016-11-19 DIAGNOSIS — M84374A Stress fracture, right foot, initial encounter for fracture: Secondary | ICD-10-CM | POA: Diagnosis not present

## 2016-11-19 DIAGNOSIS — S72401K Unspecified fracture of lower end of right femur, subsequent encounter for closed fracture with nonunion: Secondary | ICD-10-CM | POA: Diagnosis not present

## 2016-11-19 DIAGNOSIS — M79671 Pain in right foot: Secondary | ICD-10-CM | POA: Diagnosis not present

## 2016-11-20 DIAGNOSIS — R5383 Other fatigue: Secondary | ICD-10-CM | POA: Diagnosis not present

## 2016-11-20 DIAGNOSIS — E559 Vitamin D deficiency, unspecified: Secondary | ICD-10-CM | POA: Diagnosis not present

## 2016-11-20 DIAGNOSIS — I509 Heart failure, unspecified: Secondary | ICD-10-CM | POA: Diagnosis not present

## 2016-11-20 DIAGNOSIS — N19 Unspecified kidney failure: Secondary | ICD-10-CM | POA: Diagnosis not present

## 2016-11-20 DIAGNOSIS — D649 Anemia, unspecified: Secondary | ICD-10-CM | POA: Diagnosis not present

## 2016-11-24 DIAGNOSIS — L89894 Pressure ulcer of other site, stage 4: Secondary | ICD-10-CM | POA: Diagnosis not present

## 2016-11-25 DIAGNOSIS — S72401K Unspecified fracture of lower end of right femur, subsequent encounter for closed fracture with nonunion: Secondary | ICD-10-CM | POA: Diagnosis not present

## 2016-12-04 DIAGNOSIS — I10 Essential (primary) hypertension: Secondary | ICD-10-CM | POA: Diagnosis not present

## 2016-12-04 DIAGNOSIS — D649 Anemia, unspecified: Secondary | ICD-10-CM | POA: Diagnosis not present

## 2016-12-08 DIAGNOSIS — Z09 Encounter for follow-up examination after completed treatment for conditions other than malignant neoplasm: Secondary | ICD-10-CM | POA: Diagnosis not present

## 2016-12-08 DIAGNOSIS — L89894 Pressure ulcer of other site, stage 4: Secondary | ICD-10-CM | POA: Diagnosis not present

## 2016-12-13 DIAGNOSIS — F419 Anxiety disorder, unspecified: Secondary | ICD-10-CM | POA: Diagnosis not present

## 2016-12-13 DIAGNOSIS — F17210 Nicotine dependence, cigarettes, uncomplicated: Secondary | ICD-10-CM | POA: Diagnosis not present

## 2016-12-13 DIAGNOSIS — M84374D Stress fracture, right foot, subsequent encounter for fracture with routine healing: Secondary | ICD-10-CM | POA: Diagnosis not present

## 2016-12-13 DIAGNOSIS — E7800 Pure hypercholesterolemia, unspecified: Secondary | ICD-10-CM | POA: Diagnosis not present

## 2016-12-13 DIAGNOSIS — K769 Liver disease, unspecified: Secondary | ICD-10-CM | POA: Diagnosis not present

## 2016-12-13 DIAGNOSIS — K219 Gastro-esophageal reflux disease without esophagitis: Secondary | ICD-10-CM | POA: Diagnosis not present

## 2016-12-13 DIAGNOSIS — Z48812 Encounter for surgical aftercare following surgery on the circulatory system: Secondary | ICD-10-CM | POA: Diagnosis not present

## 2016-12-13 DIAGNOSIS — Z86711 Personal history of pulmonary embolism: Secondary | ICD-10-CM | POA: Diagnosis not present

## 2016-12-13 DIAGNOSIS — I672 Cerebral atherosclerosis: Secondary | ICD-10-CM | POA: Diagnosis not present

## 2016-12-13 DIAGNOSIS — Z85118 Personal history of other malignant neoplasm of bronchus and lung: Secondary | ICD-10-CM | POA: Diagnosis not present

## 2016-12-13 DIAGNOSIS — Z8673 Personal history of transient ischemic attack (TIA), and cerebral infarction without residual deficits: Secondary | ICD-10-CM | POA: Diagnosis not present

## 2016-12-13 DIAGNOSIS — Z79891 Long term (current) use of opiate analgesic: Secondary | ICD-10-CM | POA: Diagnosis not present

## 2016-12-13 DIAGNOSIS — G609 Hereditary and idiopathic neuropathy, unspecified: Secondary | ICD-10-CM | POA: Diagnosis not present

## 2016-12-13 DIAGNOSIS — J439 Emphysema, unspecified: Secondary | ICD-10-CM | POA: Diagnosis not present

## 2016-12-13 DIAGNOSIS — M159 Polyosteoarthritis, unspecified: Secondary | ICD-10-CM | POA: Diagnosis not present

## 2016-12-13 DIAGNOSIS — F329 Major depressive disorder, single episode, unspecified: Secondary | ICD-10-CM | POA: Diagnosis not present

## 2016-12-14 DIAGNOSIS — G4700 Insomnia, unspecified: Secondary | ICD-10-CM | POA: Diagnosis not present

## 2016-12-14 DIAGNOSIS — I739 Peripheral vascular disease, unspecified: Secondary | ICD-10-CM | POA: Diagnosis not present

## 2016-12-14 DIAGNOSIS — M79604 Pain in right leg: Secondary | ICD-10-CM | POA: Diagnosis not present

## 2016-12-14 DIAGNOSIS — F331 Major depressive disorder, recurrent, moderate: Secondary | ICD-10-CM | POA: Diagnosis not present

## 2017-01-04 DIAGNOSIS — M84374A Stress fracture, right foot, initial encounter for fracture: Secondary | ICD-10-CM | POA: Diagnosis not present

## 2017-01-04 DIAGNOSIS — M8589 Other specified disorders of bone density and structure, multiple sites: Secondary | ICD-10-CM | POA: Diagnosis not present

## 2017-01-04 DIAGNOSIS — Z780 Asymptomatic menopausal state: Secondary | ICD-10-CM | POA: Diagnosis not present

## 2017-01-04 DIAGNOSIS — S72401K Unspecified fracture of lower end of right femur, subsequent encounter for closed fracture with nonunion: Secondary | ICD-10-CM | POA: Diagnosis not present

## 2017-01-08 DIAGNOSIS — Z0000 Encounter for general adult medical examination without abnormal findings: Secondary | ICD-10-CM | POA: Diagnosis not present

## 2017-01-08 DIAGNOSIS — Z5181 Encounter for therapeutic drug level monitoring: Secondary | ICD-10-CM | POA: Diagnosis not present

## 2017-01-08 DIAGNOSIS — Z23 Encounter for immunization: Secondary | ICD-10-CM | POA: Diagnosis not present

## 2017-01-08 DIAGNOSIS — Z6822 Body mass index (BMI) 22.0-22.9, adult: Secondary | ICD-10-CM | POA: Diagnosis not present

## 2017-01-08 DIAGNOSIS — I10 Essential (primary) hypertension: Secondary | ICD-10-CM | POA: Diagnosis not present

## 2017-01-08 DIAGNOSIS — Z79899 Other long term (current) drug therapy: Secondary | ICD-10-CM | POA: Diagnosis not present

## 2017-01-08 DIAGNOSIS — M810 Age-related osteoporosis without current pathological fracture: Secondary | ICD-10-CM | POA: Diagnosis not present

## 2017-02-12 DIAGNOSIS — M79604 Pain in right leg: Secondary | ICD-10-CM | POA: Diagnosis not present

## 2017-02-12 DIAGNOSIS — G894 Chronic pain syndrome: Secondary | ICD-10-CM | POA: Diagnosis not present

## 2017-02-12 DIAGNOSIS — Z5181 Encounter for therapeutic drug level monitoring: Secondary | ICD-10-CM | POA: Diagnosis not present

## 2017-02-12 DIAGNOSIS — Z79899 Other long term (current) drug therapy: Secondary | ICD-10-CM | POA: Diagnosis not present

## 2017-02-12 DIAGNOSIS — I739 Peripheral vascular disease, unspecified: Secondary | ICD-10-CM | POA: Diagnosis not present

## 2017-02-12 DIAGNOSIS — F419 Anxiety disorder, unspecified: Secondary | ICD-10-CM | POA: Diagnosis not present

## 2017-03-29 DIAGNOSIS — M25461 Effusion, right knee: Secondary | ICD-10-CM | POA: Diagnosis not present

## 2017-03-29 DIAGNOSIS — M546 Pain in thoracic spine: Secondary | ICD-10-CM | POA: Diagnosis not present

## 2017-03-29 DIAGNOSIS — S199XXA Unspecified injury of neck, initial encounter: Secondary | ICD-10-CM | POA: Diagnosis not present

## 2017-03-29 DIAGNOSIS — M79604 Pain in right leg: Secondary | ICD-10-CM | POA: Diagnosis not present

## 2017-03-29 DIAGNOSIS — R42 Dizziness and giddiness: Secondary | ICD-10-CM | POA: Diagnosis not present

## 2017-03-29 DIAGNOSIS — M545 Low back pain: Secondary | ICD-10-CM | POA: Diagnosis not present

## 2017-03-29 DIAGNOSIS — R55 Syncope and collapse: Secondary | ICD-10-CM | POA: Diagnosis not present

## 2017-03-29 DIAGNOSIS — S0990XA Unspecified injury of head, initial encounter: Secondary | ICD-10-CM | POA: Diagnosis not present

## 2017-03-29 DIAGNOSIS — M79651 Pain in right thigh: Secondary | ICD-10-CM | POA: Diagnosis not present

## 2017-03-29 DIAGNOSIS — M25561 Pain in right knee: Secondary | ICD-10-CM | POA: Diagnosis not present

## 2017-03-29 DIAGNOSIS — R52 Pain, unspecified: Secondary | ICD-10-CM | POA: Diagnosis not present

## 2017-03-30 DIAGNOSIS — R55 Syncope and collapse: Secondary | ICD-10-CM | POA: Diagnosis not present

## 2017-03-30 DIAGNOSIS — S82044A Nondisplaced comminuted fracture of right patella, initial encounter for closed fracture: Secondary | ICD-10-CM | POA: Diagnosis not present

## 2017-03-30 DIAGNOSIS — I700 Atherosclerosis of aorta: Secondary | ICD-10-CM | POA: Diagnosis not present

## 2017-03-30 DIAGNOSIS — I6523 Occlusion and stenosis of bilateral carotid arteries: Secondary | ICD-10-CM | POA: Diagnosis not present

## 2017-03-30 DIAGNOSIS — M25061 Hemarthrosis, right knee: Secondary | ICD-10-CM | POA: Diagnosis not present

## 2017-03-30 DIAGNOSIS — F1120 Opioid dependence, uncomplicated: Secondary | ICD-10-CM | POA: Diagnosis not present

## 2017-03-30 DIAGNOSIS — M25019 Hemarthrosis, unspecified shoulder: Secondary | ICD-10-CM | POA: Diagnosis not present

## 2017-03-30 DIAGNOSIS — W19XXXA Unspecified fall, initial encounter: Secondary | ICD-10-CM | POA: Diagnosis not present

## 2017-03-30 DIAGNOSIS — R7989 Other specified abnormal findings of blood chemistry: Secondary | ICD-10-CM | POA: Diagnosis not present

## 2017-03-30 DIAGNOSIS — M25561 Pain in right knee: Secondary | ICD-10-CM | POA: Diagnosis not present

## 2017-03-31 DIAGNOSIS — R55 Syncope and collapse: Secondary | ICD-10-CM | POA: Diagnosis not present

## 2017-03-31 DIAGNOSIS — F1120 Opioid dependence, uncomplicated: Secondary | ICD-10-CM | POA: Diagnosis not present

## 2017-03-31 DIAGNOSIS — W19XXXA Unspecified fall, initial encounter: Secondary | ICD-10-CM | POA: Diagnosis not present

## 2017-03-31 DIAGNOSIS — M25019 Hemarthrosis, unspecified shoulder: Secondary | ICD-10-CM | POA: Diagnosis not present

## 2017-04-01 DIAGNOSIS — M25061 Hemarthrosis, right knee: Secondary | ICD-10-CM | POA: Diagnosis not present

## 2017-04-01 DIAGNOSIS — J449 Chronic obstructive pulmonary disease, unspecified: Secondary | ICD-10-CM | POA: Diagnosis not present

## 2017-04-01 DIAGNOSIS — S3992XA Unspecified injury of lower back, initial encounter: Secondary | ICD-10-CM | POA: Diagnosis not present

## 2017-04-01 DIAGNOSIS — K5900 Constipation, unspecified: Secondary | ICD-10-CM | POA: Diagnosis not present

## 2017-04-01 DIAGNOSIS — R5381 Other malaise: Secondary | ICD-10-CM | POA: Diagnosis not present

## 2017-04-01 DIAGNOSIS — S82001A Unspecified fracture of right patella, initial encounter for closed fracture: Secondary | ICD-10-CM | POA: Diagnosis not present

## 2017-04-01 DIAGNOSIS — Z7902 Long term (current) use of antithrombotics/antiplatelets: Secondary | ICD-10-CM | POA: Diagnosis not present

## 2017-04-01 DIAGNOSIS — Z79899 Other long term (current) drug therapy: Secondary | ICD-10-CM | POA: Diagnosis not present

## 2017-04-01 DIAGNOSIS — Z7401 Bed confinement status: Secondary | ICD-10-CM | POA: Diagnosis not present

## 2017-04-01 DIAGNOSIS — Z79891 Long term (current) use of opiate analgesic: Secondary | ICD-10-CM | POA: Diagnosis not present

## 2017-04-01 DIAGNOSIS — Z86711 Personal history of pulmonary embolism: Secondary | ICD-10-CM | POA: Diagnosis not present

## 2017-04-01 DIAGNOSIS — F1120 Opioid dependence, uncomplicated: Secondary | ICD-10-CM | POA: Diagnosis not present

## 2017-04-01 DIAGNOSIS — R279 Unspecified lack of coordination: Secondary | ICD-10-CM | POA: Diagnosis not present

## 2017-04-01 DIAGNOSIS — M5116 Intervertebral disc disorders with radiculopathy, lumbar region: Secondary | ICD-10-CM | POA: Diagnosis not present

## 2017-04-01 DIAGNOSIS — R42 Dizziness and giddiness: Secondary | ICD-10-CM | POA: Diagnosis not present

## 2017-04-01 DIAGNOSIS — Z9181 History of falling: Secondary | ICD-10-CM | POA: Diagnosis not present

## 2017-04-01 DIAGNOSIS — M549 Dorsalgia, unspecified: Secondary | ICD-10-CM | POA: Diagnosis not present

## 2017-04-01 DIAGNOSIS — M810 Age-related osteoporosis without current pathological fracture: Secondary | ICD-10-CM | POA: Diagnosis not present

## 2017-04-01 DIAGNOSIS — W19XXXA Unspecified fall, initial encounter: Secondary | ICD-10-CM | POA: Diagnosis not present

## 2017-04-01 DIAGNOSIS — Z95828 Presence of other vascular implants and grafts: Secondary | ICD-10-CM | POA: Diagnosis not present

## 2017-04-01 DIAGNOSIS — I10 Essential (primary) hypertension: Secondary | ICD-10-CM | POA: Diagnosis not present

## 2017-04-01 DIAGNOSIS — Z86718 Personal history of other venous thrombosis and embolism: Secondary | ICD-10-CM | POA: Diagnosis not present

## 2017-04-01 DIAGNOSIS — M25019 Hemarthrosis, unspecified shoulder: Secondary | ICD-10-CM | POA: Diagnosis not present

## 2017-04-01 DIAGNOSIS — R600 Localized edema: Secondary | ICD-10-CM | POA: Diagnosis not present

## 2017-04-01 DIAGNOSIS — M818 Other osteoporosis without current pathological fracture: Secondary | ICD-10-CM | POA: Diagnosis not present

## 2017-04-01 DIAGNOSIS — I739 Peripheral vascular disease, unspecified: Secondary | ICD-10-CM | POA: Diagnosis not present

## 2017-04-01 DIAGNOSIS — R55 Syncope and collapse: Secondary | ICD-10-CM | POA: Diagnosis not present

## 2017-04-01 DIAGNOSIS — K219 Gastro-esophageal reflux disease without esophagitis: Secondary | ICD-10-CM | POA: Diagnosis not present

## 2017-04-02 DIAGNOSIS — F1120 Opioid dependence, uncomplicated: Secondary | ICD-10-CM | POA: Diagnosis not present

## 2017-04-02 DIAGNOSIS — R55 Syncope and collapse: Secondary | ICD-10-CM | POA: Diagnosis not present

## 2017-04-02 DIAGNOSIS — M25019 Hemarthrosis, unspecified shoulder: Secondary | ICD-10-CM | POA: Diagnosis not present

## 2017-04-02 DIAGNOSIS — W19XXXA Unspecified fall, initial encounter: Secondary | ICD-10-CM | POA: Diagnosis not present

## 2017-04-03 DIAGNOSIS — M25019 Hemarthrosis, unspecified shoulder: Secondary | ICD-10-CM | POA: Diagnosis not present

## 2017-04-03 DIAGNOSIS — R55 Syncope and collapse: Secondary | ICD-10-CM | POA: Diagnosis not present

## 2017-04-03 DIAGNOSIS — F1120 Opioid dependence, uncomplicated: Secondary | ICD-10-CM | POA: Diagnosis not present

## 2017-04-03 DIAGNOSIS — W19XXXA Unspecified fall, initial encounter: Secondary | ICD-10-CM | POA: Diagnosis not present

## 2017-04-04 DIAGNOSIS — R55 Syncope and collapse: Secondary | ICD-10-CM | POA: Diagnosis not present

## 2017-04-04 DIAGNOSIS — M25019 Hemarthrosis, unspecified shoulder: Secondary | ICD-10-CM | POA: Diagnosis not present

## 2017-04-04 DIAGNOSIS — F1120 Opioid dependence, uncomplicated: Secondary | ICD-10-CM | POA: Diagnosis not present

## 2017-04-04 DIAGNOSIS — W19XXXA Unspecified fall, initial encounter: Secondary | ICD-10-CM | POA: Diagnosis not present

## 2017-04-05 DIAGNOSIS — F1120 Opioid dependence, uncomplicated: Secondary | ICD-10-CM | POA: Diagnosis not present

## 2017-04-05 DIAGNOSIS — R55 Syncope and collapse: Secondary | ICD-10-CM | POA: Diagnosis not present

## 2017-04-05 DIAGNOSIS — M25019 Hemarthrosis, unspecified shoulder: Secondary | ICD-10-CM | POA: Diagnosis not present

## 2017-04-05 DIAGNOSIS — W19XXXA Unspecified fall, initial encounter: Secondary | ICD-10-CM | POA: Diagnosis not present

## 2017-04-06 DIAGNOSIS — W19XXXA Unspecified fall, initial encounter: Secondary | ICD-10-CM | POA: Diagnosis not present

## 2017-04-06 DIAGNOSIS — R42 Dizziness and giddiness: Secondary | ICD-10-CM | POA: Diagnosis not present

## 2017-04-06 DIAGNOSIS — K219 Gastro-esophageal reflux disease without esophagitis: Secondary | ICD-10-CM | POA: Diagnosis not present

## 2017-04-06 DIAGNOSIS — R2689 Other abnormalities of gait and mobility: Secondary | ICD-10-CM | POA: Diagnosis not present

## 2017-04-06 DIAGNOSIS — R55 Syncope and collapse: Secondary | ICD-10-CM | POA: Diagnosis not present

## 2017-04-06 DIAGNOSIS — M5440 Lumbago with sciatica, unspecified side: Secondary | ICD-10-CM | POA: Diagnosis not present

## 2017-04-06 DIAGNOSIS — G894 Chronic pain syndrome: Secondary | ICD-10-CM | POA: Diagnosis not present

## 2017-04-06 DIAGNOSIS — I10 Essential (primary) hypertension: Secondary | ICD-10-CM | POA: Diagnosis not present

## 2017-04-06 DIAGNOSIS — M545 Low back pain: Secondary | ICD-10-CM | POA: Diagnosis not present

## 2017-04-06 DIAGNOSIS — R2681 Unsteadiness on feet: Secondary | ICD-10-CM | POA: Diagnosis not present

## 2017-04-06 DIAGNOSIS — R279 Unspecified lack of coordination: Secondary | ICD-10-CM | POA: Diagnosis not present

## 2017-04-06 DIAGNOSIS — Z86711 Personal history of pulmonary embolism: Secondary | ICD-10-CM | POA: Diagnosis not present

## 2017-04-06 DIAGNOSIS — Z7401 Bed confinement status: Secondary | ICD-10-CM | POA: Diagnosis not present

## 2017-04-06 DIAGNOSIS — G8918 Other acute postprocedural pain: Secondary | ICD-10-CM | POA: Diagnosis not present

## 2017-04-06 DIAGNOSIS — S82001D Unspecified fracture of right patella, subsequent encounter for closed fracture with routine healing: Secondary | ICD-10-CM | POA: Diagnosis not present

## 2017-04-06 DIAGNOSIS — F1120 Opioid dependence, uncomplicated: Secondary | ICD-10-CM | POA: Diagnosis not present

## 2017-04-06 DIAGNOSIS — Z95828 Presence of other vascular implants and grafts: Secondary | ICD-10-CM | POA: Diagnosis not present

## 2017-04-06 DIAGNOSIS — M810 Age-related osteoporosis without current pathological fracture: Secondary | ICD-10-CM | POA: Diagnosis not present

## 2017-04-06 DIAGNOSIS — I739 Peripheral vascular disease, unspecified: Secondary | ICD-10-CM | POA: Diagnosis not present

## 2017-04-06 DIAGNOSIS — Z9181 History of falling: Secondary | ICD-10-CM | POA: Diagnosis not present

## 2017-04-06 DIAGNOSIS — M25019 Hemarthrosis, unspecified shoulder: Secondary | ICD-10-CM | POA: Diagnosis not present

## 2017-04-07 DIAGNOSIS — G894 Chronic pain syndrome: Secondary | ICD-10-CM | POA: Diagnosis not present

## 2017-04-07 DIAGNOSIS — R55 Syncope and collapse: Secondary | ICD-10-CM | POA: Diagnosis not present

## 2017-04-07 DIAGNOSIS — I10 Essential (primary) hypertension: Secondary | ICD-10-CM | POA: Diagnosis not present

## 2017-04-07 DIAGNOSIS — K219 Gastro-esophageal reflux disease without esophagitis: Secondary | ICD-10-CM | POA: Diagnosis not present

## 2017-04-07 DIAGNOSIS — I739 Peripheral vascular disease, unspecified: Secondary | ICD-10-CM | POA: Diagnosis not present

## 2017-04-07 DIAGNOSIS — M5440 Lumbago with sciatica, unspecified side: Secondary | ICD-10-CM | POA: Diagnosis not present

## 2017-04-07 DIAGNOSIS — G8918 Other acute postprocedural pain: Secondary | ICD-10-CM | POA: Diagnosis not present

## 2017-04-07 DIAGNOSIS — M545 Low back pain: Secondary | ICD-10-CM | POA: Diagnosis not present

## 2017-04-12 DIAGNOSIS — S82001D Unspecified fracture of right patella, subsequent encounter for closed fracture with routine healing: Secondary | ICD-10-CM | POA: Diagnosis not present

## 2017-04-15 DIAGNOSIS — R42 Dizziness and giddiness: Secondary | ICD-10-CM | POA: Diagnosis not present

## 2017-04-15 DIAGNOSIS — R2681 Unsteadiness on feet: Secondary | ICD-10-CM | POA: Diagnosis not present

## 2017-04-15 DIAGNOSIS — R2689 Other abnormalities of gait and mobility: Secondary | ICD-10-CM | POA: Diagnosis not present

## 2017-04-21 DIAGNOSIS — R42 Dizziness and giddiness: Secondary | ICD-10-CM | POA: Diagnosis not present

## 2017-04-21 DIAGNOSIS — R2689 Other abnormalities of gait and mobility: Secondary | ICD-10-CM | POA: Diagnosis not present

## 2017-04-21 DIAGNOSIS — R2681 Unsteadiness on feet: Secondary | ICD-10-CM | POA: Diagnosis not present

## 2017-04-23 DIAGNOSIS — R2681 Unsteadiness on feet: Secondary | ICD-10-CM | POA: Diagnosis not present

## 2017-04-23 DIAGNOSIS — R42 Dizziness and giddiness: Secondary | ICD-10-CM | POA: Diagnosis not present

## 2017-04-23 DIAGNOSIS — R2689 Other abnormalities of gait and mobility: Secondary | ICD-10-CM | POA: Diagnosis not present

## 2017-04-28 DIAGNOSIS — I10 Essential (primary) hypertension: Secondary | ICD-10-CM | POA: Diagnosis not present

## 2017-04-28 DIAGNOSIS — G894 Chronic pain syndrome: Secondary | ICD-10-CM | POA: Diagnosis not present

## 2017-04-28 DIAGNOSIS — G8918 Other acute postprocedural pain: Secondary | ICD-10-CM | POA: Diagnosis not present

## 2017-04-28 DIAGNOSIS — Z79899 Other long term (current) drug therapy: Secondary | ICD-10-CM | POA: Diagnosis not present

## 2017-04-28 DIAGNOSIS — M545 Low back pain: Secondary | ICD-10-CM | POA: Diagnosis not present

## 2017-04-28 DIAGNOSIS — E785 Hyperlipidemia, unspecified: Secondary | ICD-10-CM | POA: Diagnosis not present

## 2017-04-28 DIAGNOSIS — M5440 Lumbago with sciatica, unspecified side: Secondary | ICD-10-CM | POA: Diagnosis not present

## 2017-04-29 DIAGNOSIS — R2681 Unsteadiness on feet: Secondary | ICD-10-CM | POA: Diagnosis not present

## 2017-04-29 DIAGNOSIS — R2689 Other abnormalities of gait and mobility: Secondary | ICD-10-CM | POA: Diagnosis not present

## 2017-04-29 DIAGNOSIS — R42 Dizziness and giddiness: Secondary | ICD-10-CM | POA: Diagnosis not present

## 2017-05-05 DIAGNOSIS — G894 Chronic pain syndrome: Secondary | ICD-10-CM | POA: Diagnosis not present

## 2017-05-05 DIAGNOSIS — M545 Low back pain: Secondary | ICD-10-CM | POA: Diagnosis not present

## 2017-05-05 DIAGNOSIS — Z79899 Other long term (current) drug therapy: Secondary | ICD-10-CM | POA: Diagnosis not present

## 2017-05-05 DIAGNOSIS — G8918 Other acute postprocedural pain: Secondary | ICD-10-CM | POA: Diagnosis not present

## 2017-05-05 DIAGNOSIS — M542 Cervicalgia: Secondary | ICD-10-CM | POA: Diagnosis not present

## 2017-05-05 DIAGNOSIS — M5440 Lumbago with sciatica, unspecified side: Secondary | ICD-10-CM | POA: Diagnosis not present

## 2017-05-07 DIAGNOSIS — J3489 Other specified disorders of nose and nasal sinuses: Secondary | ICD-10-CM | POA: Diagnosis not present

## 2017-05-13 DIAGNOSIS — M79671 Pain in right foot: Secondary | ICD-10-CM | POA: Diagnosis not present

## 2017-05-13 DIAGNOSIS — S82001D Unspecified fracture of right patella, subsequent encounter for closed fracture with routine healing: Secondary | ICD-10-CM | POA: Diagnosis not present

## 2017-05-26 DIAGNOSIS — J3489 Other specified disorders of nose and nasal sinuses: Secondary | ICD-10-CM | POA: Diagnosis not present

## 2017-06-02 DIAGNOSIS — G894 Chronic pain syndrome: Secondary | ICD-10-CM | POA: Diagnosis not present

## 2017-06-02 DIAGNOSIS — M5440 Lumbago with sciatica, unspecified side: Secondary | ICD-10-CM | POA: Diagnosis not present

## 2017-06-02 DIAGNOSIS — M545 Low back pain: Secondary | ICD-10-CM | POA: Diagnosis not present

## 2017-06-02 DIAGNOSIS — G8918 Other acute postprocedural pain: Secondary | ICD-10-CM | POA: Diagnosis not present

## 2017-06-02 DIAGNOSIS — Z79899 Other long term (current) drug therapy: Secondary | ICD-10-CM | POA: Diagnosis not present

## 2017-06-23 DIAGNOSIS — J40 Bronchitis, not specified as acute or chronic: Secondary | ICD-10-CM | POA: Diagnosis not present

## 2017-06-28 DIAGNOSIS — K219 Gastro-esophageal reflux disease without esophagitis: Secondary | ICD-10-CM | POA: Diagnosis not present

## 2017-06-28 DIAGNOSIS — R42 Dizziness and giddiness: Secondary | ICD-10-CM | POA: Diagnosis not present

## 2017-06-28 DIAGNOSIS — D72825 Bandemia: Secondary | ICD-10-CM | POA: Diagnosis not present

## 2017-06-28 DIAGNOSIS — M48061 Spinal stenosis, lumbar region without neurogenic claudication: Secondary | ICD-10-CM | POA: Diagnosis not present

## 2017-06-28 DIAGNOSIS — M549 Dorsalgia, unspecified: Secondary | ICD-10-CM | POA: Diagnosis not present

## 2017-06-28 DIAGNOSIS — Z885 Allergy status to narcotic agent status: Secondary | ICD-10-CM | POA: Diagnosis not present

## 2017-06-28 DIAGNOSIS — I10 Essential (primary) hypertension: Secondary | ICD-10-CM | POA: Diagnosis not present

## 2017-06-28 DIAGNOSIS — R918 Other nonspecific abnormal finding of lung field: Secondary | ICD-10-CM | POA: Diagnosis not present

## 2017-06-28 DIAGNOSIS — Z79899 Other long term (current) drug therapy: Secondary | ICD-10-CM | POA: Diagnosis not present

## 2017-06-28 DIAGNOSIS — R0602 Shortness of breath: Secondary | ICD-10-CM | POA: Diagnosis not present

## 2017-06-28 DIAGNOSIS — R0981 Nasal congestion: Secondary | ICD-10-CM | POA: Diagnosis not present

## 2017-06-28 DIAGNOSIS — G894 Chronic pain syndrome: Secondary | ICD-10-CM | POA: Diagnosis not present

## 2017-06-28 DIAGNOSIS — J449 Chronic obstructive pulmonary disease, unspecified: Secondary | ICD-10-CM | POA: Diagnosis not present

## 2017-06-28 DIAGNOSIS — E785 Hyperlipidemia, unspecified: Secondary | ICD-10-CM | POA: Diagnosis not present

## 2017-06-28 DIAGNOSIS — M1990 Unspecified osteoarthritis, unspecified site: Secondary | ICD-10-CM | POA: Diagnosis not present

## 2017-06-28 DIAGNOSIS — Z86718 Personal history of other venous thrombosis and embolism: Secondary | ICD-10-CM | POA: Diagnosis not present

## 2017-06-28 DIAGNOSIS — D72829 Elevated white blood cell count, unspecified: Secondary | ICD-10-CM | POA: Diagnosis not present

## 2017-06-28 DIAGNOSIS — Z7902 Long term (current) use of antithrombotics/antiplatelets: Secondary | ICD-10-CM | POA: Diagnosis not present

## 2017-06-28 DIAGNOSIS — Z888 Allergy status to other drugs, medicaments and biological substances status: Secondary | ICD-10-CM | POA: Diagnosis not present

## 2017-06-28 DIAGNOSIS — R05 Cough: Secondary | ICD-10-CM | POA: Diagnosis not present

## 2017-06-29 DIAGNOSIS — J449 Chronic obstructive pulmonary disease, unspecified: Secondary | ICD-10-CM | POA: Diagnosis not present

## 2017-06-29 DIAGNOSIS — G894 Chronic pain syndrome: Secondary | ICD-10-CM | POA: Diagnosis not present

## 2017-06-29 DIAGNOSIS — D72829 Elevated white blood cell count, unspecified: Secondary | ICD-10-CM | POA: Diagnosis not present

## 2017-06-29 DIAGNOSIS — R0602 Shortness of breath: Secondary | ICD-10-CM | POA: Diagnosis not present

## 2017-06-30 DIAGNOSIS — R0602 Shortness of breath: Secondary | ICD-10-CM | POA: Diagnosis not present

## 2017-06-30 DIAGNOSIS — J449 Chronic obstructive pulmonary disease, unspecified: Secondary | ICD-10-CM | POA: Diagnosis not present

## 2017-06-30 DIAGNOSIS — G894 Chronic pain syndrome: Secondary | ICD-10-CM | POA: Diagnosis not present

## 2017-06-30 DIAGNOSIS — D72829 Elevated white blood cell count, unspecified: Secondary | ICD-10-CM | POA: Diagnosis not present

## 2017-07-07 DIAGNOSIS — E785 Hyperlipidemia, unspecified: Secondary | ICD-10-CM | POA: Diagnosis not present

## 2017-07-07 DIAGNOSIS — I10 Essential (primary) hypertension: Secondary | ICD-10-CM | POA: Diagnosis not present

## 2017-07-07 DIAGNOSIS — M810 Age-related osteoporosis without current pathological fracture: Secondary | ICD-10-CM | POA: Diagnosis not present

## 2017-07-14 DIAGNOSIS — G8918 Other acute postprocedural pain: Secondary | ICD-10-CM | POA: Diagnosis not present

## 2017-07-14 DIAGNOSIS — M5440 Lumbago with sciatica, unspecified side: Secondary | ICD-10-CM | POA: Diagnosis not present

## 2017-07-14 DIAGNOSIS — G894 Chronic pain syndrome: Secondary | ICD-10-CM | POA: Diagnosis not present

## 2017-07-14 DIAGNOSIS — M545 Low back pain: Secondary | ICD-10-CM | POA: Diagnosis not present

## 2017-07-14 DIAGNOSIS — Z79899 Other long term (current) drug therapy: Secondary | ICD-10-CM | POA: Diagnosis not present

## 2017-07-26 DIAGNOSIS — R42 Dizziness and giddiness: Secondary | ICD-10-CM | POA: Diagnosis not present

## 2017-07-26 DIAGNOSIS — I10 Essential (primary) hypertension: Secondary | ICD-10-CM | POA: Diagnosis not present

## 2017-07-26 DIAGNOSIS — E785 Hyperlipidemia, unspecified: Secondary | ICD-10-CM | POA: Diagnosis not present

## 2017-07-26 DIAGNOSIS — M810 Age-related osteoporosis without current pathological fracture: Secondary | ICD-10-CM | POA: Diagnosis not present

## 2017-08-05 DIAGNOSIS — M810 Age-related osteoporosis without current pathological fracture: Secondary | ICD-10-CM | POA: Diagnosis not present

## 2017-08-10 DIAGNOSIS — M5416 Radiculopathy, lumbar region: Secondary | ICD-10-CM | POA: Diagnosis not present

## 2017-09-01 DIAGNOSIS — M5440 Lumbago with sciatica, unspecified side: Secondary | ICD-10-CM | POA: Diagnosis not present

## 2017-09-01 DIAGNOSIS — G894 Chronic pain syndrome: Secondary | ICD-10-CM | POA: Diagnosis not present

## 2017-09-01 DIAGNOSIS — Z79899 Other long term (current) drug therapy: Secondary | ICD-10-CM | POA: Diagnosis not present

## 2017-09-01 DIAGNOSIS — G8918 Other acute postprocedural pain: Secondary | ICD-10-CM | POA: Diagnosis not present

## 2017-09-01 DIAGNOSIS — M545 Low back pain: Secondary | ICD-10-CM | POA: Diagnosis not present

## 2017-09-02 DIAGNOSIS — M5127 Other intervertebral disc displacement, lumbosacral region: Secondary | ICD-10-CM | POA: Diagnosis not present

## 2017-09-02 DIAGNOSIS — M5386 Other specified dorsopathies, lumbar region: Secondary | ICD-10-CM | POA: Diagnosis not present

## 2017-09-02 DIAGNOSIS — M5416 Radiculopathy, lumbar region: Secondary | ICD-10-CM | POA: Diagnosis not present

## 2017-09-13 DIAGNOSIS — J3489 Other specified disorders of nose and nasal sinuses: Secondary | ICD-10-CM | POA: Diagnosis not present

## 2017-09-13 DIAGNOSIS — R0981 Nasal congestion: Secondary | ICD-10-CM | POA: Diagnosis not present

## 2017-09-13 DIAGNOSIS — J310 Chronic rhinitis: Secondary | ICD-10-CM | POA: Diagnosis not present

## 2017-09-29 DIAGNOSIS — G894 Chronic pain syndrome: Secondary | ICD-10-CM | POA: Diagnosis not present

## 2017-09-29 DIAGNOSIS — R42 Dizziness and giddiness: Secondary | ICD-10-CM | POA: Diagnosis not present

## 2017-09-29 DIAGNOSIS — G4700 Insomnia, unspecified: Secondary | ICD-10-CM | POA: Diagnosis not present

## 2017-09-29 DIAGNOSIS — Z720 Tobacco use: Secondary | ICD-10-CM | POA: Diagnosis not present

## 2017-09-29 DIAGNOSIS — M545 Low back pain: Secondary | ICD-10-CM | POA: Diagnosis not present

## 2017-09-29 DIAGNOSIS — Z79899 Other long term (current) drug therapy: Secondary | ICD-10-CM | POA: Diagnosis not present

## 2017-09-29 DIAGNOSIS — I10 Essential (primary) hypertension: Secondary | ICD-10-CM | POA: Diagnosis not present

## 2017-09-29 DIAGNOSIS — G8918 Other acute postprocedural pain: Secondary | ICD-10-CM | POA: Diagnosis not present

## 2017-10-22 DIAGNOSIS — Z1389 Encounter for screening for other disorder: Secondary | ICD-10-CM | POA: Diagnosis not present

## 2017-10-22 DIAGNOSIS — M549 Dorsalgia, unspecified: Secondary | ICD-10-CM | POA: Diagnosis not present

## 2017-10-22 DIAGNOSIS — Z0000 Encounter for general adult medical examination without abnormal findings: Secondary | ICD-10-CM | POA: Diagnosis not present

## 2017-10-22 DIAGNOSIS — I10 Essential (primary) hypertension: Secondary | ICD-10-CM | POA: Diagnosis not present

## 2017-10-22 DIAGNOSIS — M7050 Other bursitis of knee, unspecified knee: Secondary | ICD-10-CM | POA: Diagnosis not present

## 2017-10-22 DIAGNOSIS — E7800 Pure hypercholesterolemia, unspecified: Secondary | ICD-10-CM | POA: Diagnosis not present

## 2017-10-28 DIAGNOSIS — Z79899 Other long term (current) drug therapy: Secondary | ICD-10-CM | POA: Diagnosis not present

## 2017-10-28 DIAGNOSIS — M009 Pyogenic arthritis, unspecified: Secondary | ICD-10-CM | POA: Diagnosis not present

## 2017-10-28 DIAGNOSIS — T84620A Infection and inflammatory reaction due to internal fixation device of right femur, initial encounter: Secondary | ICD-10-CM | POA: Diagnosis not present

## 2017-10-28 DIAGNOSIS — Z888 Allergy status to other drugs, medicaments and biological substances status: Secondary | ICD-10-CM | POA: Diagnosis not present

## 2017-10-28 DIAGNOSIS — R945 Abnormal results of liver function studies: Secondary | ICD-10-CM

## 2017-10-28 DIAGNOSIS — G894 Chronic pain syndrome: Secondary | ICD-10-CM | POA: Diagnosis not present

## 2017-10-28 DIAGNOSIS — L02416 Cutaneous abscess of left lower limb: Secondary | ICD-10-CM | POA: Diagnosis not present

## 2017-10-28 DIAGNOSIS — M549 Dorsalgia, unspecified: Secondary | ICD-10-CM | POA: Diagnosis not present

## 2017-10-28 DIAGNOSIS — L02415 Cutaneous abscess of right lower limb: Secondary | ICD-10-CM | POA: Diagnosis not present

## 2017-10-28 DIAGNOSIS — T8451XA Infection and inflammatory reaction due to internal right hip prosthesis, initial encounter: Secondary | ICD-10-CM | POA: Diagnosis not present

## 2017-10-28 DIAGNOSIS — E785 Hyperlipidemia, unspecified: Secondary | ICD-10-CM | POA: Diagnosis not present

## 2017-10-28 DIAGNOSIS — Z86711 Personal history of pulmonary embolism: Secondary | ICD-10-CM | POA: Diagnosis not present

## 2017-10-28 DIAGNOSIS — T82898A Other specified complication of vascular prosthetic devices, implants and grafts, initial encounter: Secondary | ICD-10-CM | POA: Diagnosis not present

## 2017-10-28 DIAGNOSIS — Z885 Allergy status to narcotic agent status: Secondary | ICD-10-CM | POA: Diagnosis not present

## 2017-10-28 DIAGNOSIS — K219 Gastro-esophageal reflux disease without esophagitis: Secondary | ICD-10-CM | POA: Diagnosis not present

## 2017-10-28 DIAGNOSIS — I739 Peripheral vascular disease, unspecified: Secondary | ICD-10-CM | POA: Diagnosis not present

## 2017-10-28 DIAGNOSIS — M25461 Effusion, right knee: Secondary | ICD-10-CM | POA: Diagnosis not present

## 2017-10-28 DIAGNOSIS — R7881 Bacteremia: Secondary | ICD-10-CM | POA: Diagnosis not present

## 2017-10-28 DIAGNOSIS — D72829 Elevated white blood cell count, unspecified: Secondary | ICD-10-CM

## 2017-10-28 DIAGNOSIS — M1990 Unspecified osteoarthritis, unspecified site: Secondary | ICD-10-CM | POA: Diagnosis not present

## 2017-10-28 DIAGNOSIS — S72401S Unspecified fracture of lower end of right femur, sequela: Secondary | ICD-10-CM | POA: Diagnosis not present

## 2017-10-28 DIAGNOSIS — B9561 Methicillin susceptible Staphylococcus aureus infection as the cause of diseases classified elsewhere: Secondary | ICD-10-CM | POA: Diagnosis not present

## 2017-10-28 DIAGNOSIS — M1711 Unilateral primary osteoarthritis, right knee: Secondary | ICD-10-CM | POA: Diagnosis not present

## 2017-10-28 DIAGNOSIS — M00861 Arthritis due to other bacteria, right knee: Secondary | ICD-10-CM | POA: Diagnosis not present

## 2017-10-28 DIAGNOSIS — E871 Hypo-osmolality and hyponatremia: Secondary | ICD-10-CM

## 2017-10-28 DIAGNOSIS — S82001D Unspecified fracture of right patella, subsequent encounter for closed fracture with routine healing: Secondary | ICD-10-CM | POA: Diagnosis not present

## 2017-10-28 DIAGNOSIS — M659 Synovitis and tenosynovitis, unspecified: Secondary | ICD-10-CM | POA: Diagnosis not present

## 2017-10-28 DIAGNOSIS — E876 Hypokalemia: Secondary | ICD-10-CM | POA: Diagnosis not present

## 2017-10-28 DIAGNOSIS — I10 Essential (primary) hypertension: Secondary | ICD-10-CM | POA: Diagnosis not present

## 2017-10-28 DIAGNOSIS — M79671 Pain in right foot: Secondary | ICD-10-CM | POA: Diagnosis not present

## 2017-10-28 DIAGNOSIS — Z95828 Presence of other vascular implants and grafts: Secondary | ICD-10-CM

## 2017-10-28 DIAGNOSIS — M00061 Staphylococcal arthritis, right knee: Secondary | ICD-10-CM | POA: Diagnosis not present

## 2017-10-28 DIAGNOSIS — J449 Chronic obstructive pulmonary disease, unspecified: Secondary | ICD-10-CM | POA: Diagnosis not present

## 2017-10-28 DIAGNOSIS — M25561 Pain in right knee: Secondary | ICD-10-CM

## 2017-10-28 DIAGNOSIS — D62 Acute posthemorrhagic anemia: Secondary | ICD-10-CM | POA: Diagnosis not present

## 2017-10-29 DIAGNOSIS — M1711 Unilateral primary osteoarthritis, right knee: Secondary | ICD-10-CM | POA: Diagnosis not present

## 2017-10-29 DIAGNOSIS — M659 Synovitis and tenosynovitis, unspecified: Secondary | ICD-10-CM | POA: Diagnosis not present

## 2017-10-29 DIAGNOSIS — M25461 Effusion, right knee: Secondary | ICD-10-CM

## 2017-10-29 DIAGNOSIS — M009 Pyogenic arthritis, unspecified: Secondary | ICD-10-CM | POA: Diagnosis not present

## 2017-10-29 DIAGNOSIS — Z86711 Personal history of pulmonary embolism: Secondary | ICD-10-CM | POA: Diagnosis not present

## 2017-10-29 DIAGNOSIS — E785 Hyperlipidemia, unspecified: Secondary | ICD-10-CM | POA: Diagnosis not present

## 2017-10-29 DIAGNOSIS — I10 Essential (primary) hypertension: Secondary | ICD-10-CM | POA: Diagnosis not present

## 2017-10-29 DIAGNOSIS — L02416 Cutaneous abscess of left lower limb: Secondary | ICD-10-CM | POA: Diagnosis not present

## 2017-10-29 DIAGNOSIS — T84620A Infection and inflammatory reaction due to internal fixation device of right femur, initial encounter: Secondary | ICD-10-CM | POA: Diagnosis not present

## 2017-10-29 DIAGNOSIS — J449 Chronic obstructive pulmonary disease, unspecified: Secondary | ICD-10-CM | POA: Diagnosis not present

## 2017-10-30 ENCOUNTER — Inpatient Hospital Stay (HOSPITAL_COMMUNITY): Payer: Medicare Other

## 2017-10-30 ENCOUNTER — Encounter (HOSPITAL_COMMUNITY): Payer: Self-pay | Admitting: Family Medicine

## 2017-10-30 ENCOUNTER — Other Ambulatory Visit: Payer: Self-pay

## 2017-10-30 ENCOUNTER — Inpatient Hospital Stay (HOSPITAL_COMMUNITY)
Admission: AD | Admit: 2017-10-30 | Discharge: 2017-11-11 | DRG: 315 | Disposition: A | Discharge status: Skilled Nursing Facility | Payer: Medicare Other | Source: Other Acute Inpatient Hospital | Attending: Internal Medicine | Admitting: Internal Medicine

## 2017-10-30 DIAGNOSIS — F329 Major depressive disorder, single episode, unspecified: Secondary | ICD-10-CM | POA: Diagnosis present

## 2017-10-30 DIAGNOSIS — E876 Hypokalemia: Secondary | ICD-10-CM | POA: Diagnosis not present

## 2017-10-30 DIAGNOSIS — E8809 Other disorders of plasma-protein metabolism, not elsewhere classified: Secondary | ICD-10-CM | POA: Diagnosis present

## 2017-10-30 DIAGNOSIS — R7989 Other specified abnormal findings of blood chemistry: Secondary | ICD-10-CM | POA: Diagnosis present

## 2017-10-30 DIAGNOSIS — G8929 Other chronic pain: Secondary | ICD-10-CM

## 2017-10-30 DIAGNOSIS — B9561 Methicillin susceptible Staphylococcus aureus infection as the cause of diseases classified elsewhere: Secondary | ICD-10-CM

## 2017-10-30 DIAGNOSIS — M6281 Muscle weakness (generalized): Secondary | ICD-10-CM | POA: Diagnosis not present

## 2017-10-30 DIAGNOSIS — A4901 Methicillin susceptible Staphylococcus aureus infection, unspecified site: Secondary | ICD-10-CM | POA: Diagnosis not present

## 2017-10-30 DIAGNOSIS — M1711 Unilateral primary osteoarthritis, right knee: Secondary | ICD-10-CM | POA: Diagnosis present

## 2017-10-30 DIAGNOSIS — Z885 Allergy status to narcotic agent status: Secondary | ICD-10-CM | POA: Diagnosis not present

## 2017-10-30 DIAGNOSIS — T82898D Other specified complication of vascular prosthetic devices, implants and grafts, subsequent encounter: Secondary | ICD-10-CM | POA: Diagnosis not present

## 2017-10-30 DIAGNOSIS — R740 Nonspecific elevation of levels of transaminase and lactic acid dehydrogenase [LDH]: Secondary | ICD-10-CM | POA: Diagnosis present

## 2017-10-30 DIAGNOSIS — M0000 Staphylococcal arthritis, unspecified joint: Secondary | ICD-10-CM | POA: Diagnosis present

## 2017-10-30 DIAGNOSIS — Z886 Allergy status to analgesic agent status: Secondary | ICD-10-CM

## 2017-10-30 DIAGNOSIS — F17210 Nicotine dependence, cigarettes, uncomplicated: Secondary | ICD-10-CM | POA: Diagnosis present

## 2017-10-30 DIAGNOSIS — F419 Anxiety disorder, unspecified: Secondary | ICD-10-CM | POA: Diagnosis present

## 2017-10-30 DIAGNOSIS — D62 Acute posthemorrhagic anemia: Secondary | ICD-10-CM | POA: Diagnosis not present

## 2017-10-30 DIAGNOSIS — R278 Other lack of coordination: Secondary | ICD-10-CM | POA: Diagnosis not present

## 2017-10-30 DIAGNOSIS — D649 Anemia, unspecified: Secondary | ICD-10-CM

## 2017-10-30 DIAGNOSIS — M549 Dorsalgia, unspecified: Secondary | ICD-10-CM | POA: Diagnosis present

## 2017-10-30 DIAGNOSIS — Y832 Surgical operation with anastomosis, bypass or graft as the cause of abnormal reaction of the patient, or of later complication, without mention of misadventure at the time of the procedure: Secondary | ICD-10-CM | POA: Diagnosis present

## 2017-10-30 DIAGNOSIS — Z79899 Other long term (current) drug therapy: Secondary | ICD-10-CM

## 2017-10-30 DIAGNOSIS — Z95828 Presence of other vascular implants and grafts: Secondary | ICD-10-CM

## 2017-10-30 DIAGNOSIS — R7881 Bacteremia: Secondary | ICD-10-CM

## 2017-10-30 DIAGNOSIS — M009 Pyogenic arthritis, unspecified: Secondary | ICD-10-CM | POA: Diagnosis present

## 2017-10-30 DIAGNOSIS — I998 Other disorder of circulatory system: Secondary | ICD-10-CM | POA: Diagnosis not present

## 2017-10-30 DIAGNOSIS — Z85118 Personal history of other malignant neoplasm of bronchus and lung: Secondary | ICD-10-CM | POA: Diagnosis not present

## 2017-10-30 DIAGNOSIS — T82898S Other specified complication of vascular prosthetic devices, implants and grafts, sequela: Secondary | ICD-10-CM | POA: Diagnosis not present

## 2017-10-30 DIAGNOSIS — T82868A Thrombosis of vascular prosthetic devices, implants and grafts, initial encounter: Secondary | ICD-10-CM | POA: Diagnosis not present

## 2017-10-30 DIAGNOSIS — I739 Peripheral vascular disease, unspecified: Secondary | ICD-10-CM | POA: Diagnosis not present

## 2017-10-30 DIAGNOSIS — Z880 Allergy status to penicillin: Secondary | ICD-10-CM

## 2017-10-30 DIAGNOSIS — R748 Abnormal levels of other serum enzymes: Secondary | ICD-10-CM | POA: Diagnosis present

## 2017-10-30 DIAGNOSIS — Z8711 Personal history of peptic ulcer disease: Secondary | ICD-10-CM

## 2017-10-30 DIAGNOSIS — G894 Chronic pain syndrome: Secondary | ICD-10-CM | POA: Diagnosis present

## 2017-10-30 DIAGNOSIS — L02415 Cutaneous abscess of right lower limb: Secondary | ICD-10-CM | POA: Diagnosis present

## 2017-10-30 DIAGNOSIS — K219 Gastro-esophageal reflux disease without esophagitis: Secondary | ICD-10-CM | POA: Diagnosis present

## 2017-10-30 DIAGNOSIS — Z7401 Bed confinement status: Secondary | ICD-10-CM | POA: Diagnosis not present

## 2017-10-30 DIAGNOSIS — E785 Hyperlipidemia, unspecified: Secondary | ICD-10-CM | POA: Diagnosis present

## 2017-10-30 DIAGNOSIS — T82898A Other specified complication of vascular prosthetic devices, implants and grafts, initial encounter: Secondary | ICD-10-CM | POA: Diagnosis present

## 2017-10-30 DIAGNOSIS — D72829 Elevated white blood cell count, unspecified: Secondary | ICD-10-CM | POA: Diagnosis not present

## 2017-10-30 DIAGNOSIS — J449 Chronic obstructive pulmonary disease, unspecified: Secondary | ICD-10-CM | POA: Diagnosis present

## 2017-10-30 DIAGNOSIS — Z86711 Personal history of pulmonary embolism: Secondary | ICD-10-CM

## 2017-10-30 DIAGNOSIS — R262 Difficulty in walking, not elsewhere classified: Secondary | ICD-10-CM | POA: Diagnosis not present

## 2017-10-30 DIAGNOSIS — M00861 Arthritis due to other bacteria, right knee: Secondary | ICD-10-CM | POA: Diagnosis not present

## 2017-10-30 DIAGNOSIS — M810 Age-related osteoporosis without current pathological fracture: Secondary | ICD-10-CM | POA: Diagnosis not present

## 2017-10-30 DIAGNOSIS — I10 Essential (primary) hypertension: Secondary | ICD-10-CM | POA: Diagnosis present

## 2017-10-30 DIAGNOSIS — R945 Abnormal results of liver function studies: Secondary | ICD-10-CM | POA: Diagnosis not present

## 2017-10-30 DIAGNOSIS — Z01810 Encounter for preprocedural cardiovascular examination: Secondary | ICD-10-CM | POA: Diagnosis not present

## 2017-10-30 DIAGNOSIS — F32A Depression, unspecified: Secondary | ICD-10-CM | POA: Diagnosis present

## 2017-10-30 DIAGNOSIS — M00061 Staphylococcal arthritis, right knee: Secondary | ICD-10-CM | POA: Diagnosis not present

## 2017-10-30 DIAGNOSIS — Z888 Allergy status to other drugs, medicaments and biological substances status: Secondary | ICD-10-CM

## 2017-10-30 DIAGNOSIS — M79604 Pain in right leg: Secondary | ICD-10-CM | POA: Diagnosis not present

## 2017-10-30 DIAGNOSIS — E871 Hypo-osmolality and hyponatremia: Secondary | ICD-10-CM | POA: Diagnosis not present

## 2017-10-30 DIAGNOSIS — I340 Nonrheumatic mitral (valve) insufficiency: Secondary | ICD-10-CM | POA: Diagnosis not present

## 2017-10-30 DIAGNOSIS — M2550 Pain in unspecified joint: Secondary | ICD-10-CM | POA: Diagnosis not present

## 2017-10-30 HISTORY — DX: Nausea with vomiting, unspecified: R11.2

## 2017-10-30 HISTORY — DX: Pyogenic arthritis, unspecified: M00.9

## 2017-10-30 HISTORY — DX: Methicillin susceptible Staphylococcus aureus infection as the cause of diseases classified elsewhere: B95.61

## 2017-10-30 HISTORY — DX: Other complications of anesthesia, initial encounter: T88.59XA

## 2017-10-30 HISTORY — DX: Anemia, unspecified: D64.9

## 2017-10-30 HISTORY — DX: Dorsalgia, unspecified: M54.9

## 2017-10-30 HISTORY — DX: Personal history of peptic ulcer disease: Z87.11

## 2017-10-30 HISTORY — DX: Other specified complication of vascular prosthetic devices, implants and grafts, initial encounter: T82.898A

## 2017-10-30 HISTORY — DX: Adverse effect of unspecified anesthetic, initial encounter: T41.45XA

## 2017-10-30 HISTORY — DX: Other specified postprocedural states: Z98.890

## 2017-10-30 HISTORY — DX: Other chronic pain: G89.29

## 2017-10-30 HISTORY — DX: Bacteremia: R78.81

## 2017-10-30 LAB — COMPREHENSIVE METABOLIC PANEL
ALT: 90 U/L — ABNORMAL HIGH (ref 0–44)
AST: 103 U/L — ABNORMAL HIGH (ref 15–41)
Albumin: 1.8 g/dL — ABNORMAL LOW (ref 3.5–5.0)
Alkaline Phosphatase: 94 U/L (ref 38–126)
Anion gap: 9 (ref 5–15)
BUN: 6 mg/dL — ABNORMAL LOW (ref 8–23)
CO2: 25 mmol/L (ref 22–32)
Calcium: 7.6 mg/dL — ABNORMAL LOW (ref 8.9–10.3)
Chloride: 100 mmol/L (ref 98–111)
Creatinine, Ser: 0.66 mg/dL (ref 0.44–1.00)
GFR calc Af Amer: >60 mL/min (ref >60)
GFR calc non Af Amer: >60 mL/min (ref >60)
Glucose, Bld: 123 mg/dL — ABNORMAL HIGH (ref 70–99)
Potassium: 3.3 mmol/L — ABNORMAL LOW (ref 3.5–5.1)
Sodium: 134 mmol/L — ABNORMAL LOW (ref 135–145)
Total Bilirubin: 0.4 mg/dL (ref 0.3–1.2)
Total Protein: 5.8 g/dL — ABNORMAL LOW (ref 6.5–8.1)

## 2017-10-30 LAB — CBC WITH DIFFERENTIAL/PLATELET
Abs Immature Granulocytes: 0.2 10*3/uL — ABNORMAL HIGH (ref 0.0–0.1)
Basophils Absolute: 0 10*3/uL (ref 0.0–0.1)
Basophils Relative: 0 %
Eosinophils Absolute: 0.1 10*3/uL (ref 0.0–0.7)
Eosinophils Relative: 1 %
HCT: 25.5 % — ABNORMAL LOW (ref 36.0–46.0)
Hemoglobin: 8 g/dL — ABNORMAL LOW (ref 12.0–15.0)
Immature Granulocytes: 2 %
Lymphocytes Relative: 11 %
Lymphs Abs: 1.5 10*3/uL (ref 0.7–4.0)
MCH: 28.2 pg (ref 26.0–34.0)
MCHC: 31.4 g/dL (ref 30.0–36.0)
MCV: 89.8 fL (ref 78.0–100.0)
Monocytes Absolute: 1.1 10*3/uL — ABNORMAL HIGH (ref 0.1–1.0)
Monocytes Relative: 8 %
Neutro Abs: 10.2 10*3/uL — ABNORMAL HIGH (ref 1.7–7.7)
Neutrophils Relative %: 78 %
Platelets: 319 10*3/uL (ref 150–400)
RBC: 2.84 MIL/uL — ABNORMAL LOW (ref 3.87–5.11)
RDW: 15.4 % (ref 11.5–15.5)
WBC: 13.2 10*3/uL — ABNORMAL HIGH (ref 4.0–10.5)

## 2017-10-30 LAB — C-REACTIVE PROTEIN: CRP: 27.1 mg/dL — ABNORMAL HIGH (ref <1.0)

## 2017-10-30 LAB — TYPE AND SCREEN
ABO/RH(D): O POS
Antibody Screen: NEGATIVE

## 2017-10-30 MED ORDER — BUSPIRONE HCL 5 MG PO TABS
7.5000 mg | ORAL_TABLET | Freq: Two times a day (BID) | ORAL | Status: DC
  Administered 2017-10-30 – 2017-11-11 (×24): 7.5 mg via ORAL
  Filled 2017-10-30 (×4): qty 2
  Filled 2017-10-30: qty 1
  Filled 2017-10-30 (×2): qty 2
  Filled 2017-10-30: qty 1
  Filled 2017-10-30 (×10): qty 2
  Filled 2017-10-30: qty 1
  Filled 2017-10-30 (×2): qty 2
  Filled 2017-10-30: qty 1
  Filled 2017-10-30 (×2): qty 2
  Filled 2017-10-30 (×2): qty 1

## 2017-10-30 MED ORDER — TRAZODONE HCL 50 MG PO TABS
50.0000 mg | ORAL_TABLET | Freq: Every day | ORAL | Status: DC
  Administered 2017-10-30 – 2017-11-10 (×12): 50 mg via ORAL
  Filled 2017-10-30 (×12): qty 1

## 2017-10-30 MED ORDER — AMITRIPTYLINE HCL 50 MG PO TABS
25.0000 mg | ORAL_TABLET | Freq: Every day | ORAL | Status: DC
  Administered 2017-10-30 – 2017-11-10 (×12): 25 mg via ORAL
  Filled 2017-10-30 (×12): qty 1

## 2017-10-30 MED ORDER — METOPROLOL TARTRATE 50 MG PO TABS
50.0000 mg | ORAL_TABLET | Freq: Every day | ORAL | Status: DC
  Administered 2017-10-31: 50 mg via ORAL
  Filled 2017-10-30: qty 1

## 2017-10-30 MED ORDER — VITAMIN B-12 1000 MCG PO TABS
1000.0000 ug | ORAL_TABLET | Freq: Every day | ORAL | Status: DC
  Administered 2017-10-31 – 2017-11-11 (×12): 1000 ug via ORAL
  Filled 2017-10-30 (×12): qty 1

## 2017-10-30 MED ORDER — SERTRALINE HCL 100 MG PO TABS
100.0000 mg | ORAL_TABLET | Freq: Every day | ORAL | Status: DC
  Administered 2017-10-31 – 2017-11-11 (×12): 100 mg via ORAL
  Filled 2017-10-30 (×7): qty 1
  Filled 2017-10-30: qty 2
  Filled 2017-10-30 (×2): qty 1
  Filled 2017-10-30: qty 2
  Filled 2017-10-30: qty 1

## 2017-10-30 MED ORDER — GABAPENTIN 100 MG PO CAPS
100.0000 mg | ORAL_CAPSULE | Freq: Two times a day (BID) | ORAL | Status: DC
  Administered 2017-10-30 – 2017-11-04 (×10): 100 mg via ORAL
  Filled 2017-10-30 (×10): qty 1

## 2017-10-30 MED ORDER — ALBUTEROL SULFATE (2.5 MG/3ML) 0.083% IN NEBU
2.5000 mg | INHALATION_SOLUTION | Freq: Four times a day (QID) | RESPIRATORY_TRACT | Status: DC | PRN
Start: 2017-10-30 — End: 2017-11-11

## 2017-10-30 MED ORDER — SENNOSIDES-DOCUSATE SODIUM 8.6-50 MG PO TABS
1.0000 | ORAL_TABLET | Freq: Every evening | ORAL | Status: DC | PRN
  Filled 2017-10-30: qty 1

## 2017-10-30 MED ORDER — SUCRALFATE 1 G PO TABS
1.0000 g | ORAL_TABLET | Freq: Three times a day (TID) | ORAL | Status: DC
  Administered 2017-10-30 – 2017-11-11 (×44): 1 g via ORAL
  Filled 2017-10-30 (×48): qty 1

## 2017-10-30 MED ORDER — ACETAMINOPHEN 650 MG RE SUPP: 650.0000 mg | Freq: Four times a day (QID) | RECTAL | Status: DC | PRN

## 2017-10-30 MED ORDER — DICYCLOMINE HCL 20 MG PO TABS
20.0000 mg | ORAL_TABLET | Freq: Three times a day (TID) | ORAL | Status: DC
  Administered 2017-10-31 – 2017-11-11 (×32): 20 mg via ORAL
  Filled 2017-10-30 (×35): qty 1

## 2017-10-30 MED ORDER — MORPHINE SULFATE (PF) 4 MG/ML IV SOLN
3.0000 mg | INTRAVENOUS | Status: DC | PRN
  Administered 2017-10-31 – 2017-11-02 (×10): 3 mg via INTRAVENOUS
  Filled 2017-10-30 (×8): qty 1

## 2017-10-30 MED ORDER — SODIUM CHLORIDE 0.9 % IV SOLN
INTRAVENOUS | Status: DC
  Administered 2017-10-30: 21:00:00 via INTRAVENOUS

## 2017-10-30 MED ORDER — ACETAMINOPHEN 325 MG PO TABS
650.0000 mg | ORAL_TABLET | Freq: Four times a day (QID) | ORAL | Status: DC | PRN
  Administered 2017-11-04: 650 mg via ORAL

## 2017-10-30 MED ORDER — ONDANSETRON HCL 4 MG PO TABS
4.0000 mg | ORAL_TABLET | Freq: Four times a day (QID) | ORAL | Status: DC | PRN
Start: 2017-10-30 — End: 2017-11-11
  Administered 2017-11-07: 4 mg via ORAL
  Filled 2017-10-30: qty 1

## 2017-10-30 MED ORDER — ATORVASTATIN CALCIUM 20 MG PO TABS
20.0000 mg | ORAL_TABLET | Freq: Every day | ORAL | Status: DC
  Administered 2017-10-31 – 2017-11-10 (×11): 20 mg via ORAL
  Filled 2017-10-30 (×11): qty 1

## 2017-10-30 MED ORDER — ONDANSETRON HCL 4 MG/2ML IJ SOLN
4.0000 mg | Freq: Four times a day (QID) | INTRAMUSCULAR | Status: DC | PRN
  Administered 2017-11-11: 4 mg via INTRAVENOUS
  Filled 2017-10-30 (×2): qty 2

## 2017-10-30 MED ORDER — OXYCODONE-ACETAMINOPHEN 5-325 MG PO TABS
1.0000 | ORAL_TABLET | ORAL | Status: DC | PRN
  Administered 2017-10-30 – 2017-11-02 (×12): 1 via ORAL
  Filled 2017-10-30 (×11): qty 1

## 2017-10-30 MED ORDER — CEFAZOLIN SODIUM-DEXTROSE 2-4 GM/100ML-% IV SOLN
2.0000 g | Freq: Three times a day (TID) | INTRAVENOUS | Status: DC
  Administered 2017-10-31 – 2017-11-01 (×5): 2 g via INTRAVENOUS
  Filled 2017-10-30 (×6): qty 100

## 2017-10-30 MED ORDER — HEPARIN SODIUM (PORCINE) 5000 UNIT/ML IJ SOLN
5000.0000 [IU] | Freq: Three times a day (TID) | INTRAMUSCULAR | Status: DC
  Administered 2017-10-30 – 2017-11-11 (×34): 5000 [IU] via SUBCUTANEOUS
  Filled 2017-10-30 (×34): qty 1

## 2017-10-30 MED ORDER — HYDROCODONE-ACETAMINOPHEN 5-325 MG PO TABS: 1.0000 | ORAL_TABLET | ORAL | Status: DC | PRN

## 2017-10-30 MED ORDER — PANTOPRAZOLE SODIUM 40 MG PO TBEC
40.0000 mg | DELAYED_RELEASE_TABLET | Freq: Every day | ORAL | Status: DC
  Administered 2017-10-31 – 2017-11-11 (×12): 40 mg via ORAL
  Filled 2017-10-30 (×12): qty 1

## 2017-10-30 NOTE — Progress Notes (Signed)
Pt arrived to room 5N10 via stretcher from [place] from [place]. Received report from Levada Dy, Therapist, sports at [facility]. See assessment. Will continue to monitor.

## 2017-10-30 NOTE — Progress Notes (Signed)
Pharmacy Antibiotic Note  Courtney Grant is a 84 y.o. female admitted on 10/30/2017 with bacteremia.  Pharmacy has been consulted for cefazolin dosing.  Transferred from [facility] after open R distal thigh abcess I&D, R distal femur deep implant removal and R knee arthroscopic I&D on 8/16. BCx on 8/15 grew staph aureus (MRSA - on BCID) per records from [place]. Was being treated on vancomycin (1500 mg IV q24 hr; LD 8/16@1747 ) and ceftriaxone (LD 8/16@2051 ). CT scan showing distal R thigh abscess and concern for R fem-pop bypass graft occlusion.   Scr 0.66 (CrCl ~50 mL/min; adjusting Scr for age ~40 mL/min). WBC elevated at 13.2. Afebrile. Allergy listed to PCN - however, has tolerated cephalosporins.    Plan: Start cefazolin 2 g IV every 8 hours Monitor renal fx, cx results, clinical pic    Temp (24hrs), Avg:98.7 F (37.1 C), Min:98.7 F (37.1 C), Max:98.7 F (37.1 C)  No results for input(s): WBC, CREATININE, LATICACIDVEN, VANCOTROUGH, VANCOPEAK, VANCORANDOM, GENTTROUGH, GENTPEAK, GENTRANDOM, TOBRATROUGH, TOBRAPEAK, TOBRARND, AMIKACINPEAK, AMIKACINTROU, AMIKACIN in the last 168 hours.  CrCl cannot be calculated (Patient's most recent lab result is older than the maximum 21 days allowed.).    Allergies  Allergen Reactions  . Aspirin Nausea And Vomiting  . Codeine Nausea And Vomiting  . Ibuprofen Nausea And Vomiting  . Penicillins Rash    Antimicrobials this admission: Cefazolin 8/17 >>   Vanc/CTX 8/15>>8/16 at OSH  Dose adjustments this admission: N/A  Microbiology results: 8/15 BCx: staph aureus (BCID MSSA) at OSH  Thank you for allowing pharmacy to be a part of this patient's care.  Doylene Canard, PharmD Clinical Pharmacist  Pager: [phone] Phone: [phone] 10/30/2017 7:58 PM

## 2017-10-30 NOTE — Consult Note (Signed)
Hospital Consult    Reason for Consult: Concern for thrombosed right femoral distal bypass Referring Physician: [facility] MRN #:  [id number]  History of Present Illness: This is a 84 y.o. female with history of COPD, lung cancer, tobacco abuse, and peripheral vascular disease that has been admitted to [facility] with septic arthritis of her right knee and vascular surgery was asked to evaluate concern for a thrombosed bypass in her right leg of unknown chronicity.  Patient states she had a right fem distal bypass done in [place] years ago.  She has been lost to follow-up for some time per the patient.  She describes 2 years of numbness in her right leg that is unchanged.  She describes no new motor weakness in her right foot.  She had multiple surgery at [facility] for the septic arthritis and on my evaluation her right knee seems to be most bothersome.  She can wiggle her toes and her foot is warm with no tissue loss.   Past Medical History:  Diagnosis Date  . Benign paroxysmal positional vertigo 10/25/2015  . COPD (chronic obstructive pulmonary disease) ([place])   . Lung cancer ([place]) 02/23/2016   ADENOCARINOMA RUL  . Tobacco abuse   . Venous thrombosis    H/O    Past Surgical History:  Procedure Laterality Date  . LUNG LOBECTOMY  02/23/2006   DR.BURNEY  . REVISION TOTAL HIP ARTHROPLASTY    . THORACOTOMY  02/23/2006   DR.BURNEY  . TIBIA FRACTURE SURGERY      Allergies  Allergen Reactions  . Aspirin Nausea And Vomiting  . Codeine Nausea And Vomiting  . Ibuprofen Nausea And Vomiting  . Penicillins Rash    Prior to Admission medications   Medication Sig Start Date End Date Taking? Authorizing Provider  dicyclomine (BENTYL) 20 MG tablet Take 20 mg by mouth.    [provider]  gabapentin (NEURONTIN) 100 MG capsule TAKE 1 CAPSULE BY MOUTH AT BEDTIME FOR 3 DAYS, THEN TAKE 1 CAPSULE BY MOUTH TWICE (2) DAILY 07/26/15   [provider]  metoprolol  (LOPRESSOR) 50 MG tablet Take 50 mg by mouth daily.      [provider]  mirtazapine (REMERON) 7.5 MG tablet  10/05/15   [provider]  omeprazole (PRILOSEC) 20 MG capsule Take 20 mg by mouth daily.      [provider]  ondansetron (ZOFRAN) 8 MG tablet  10/21/15   [provider]  pantoprazole (PROTONIX) 40 MG tablet  09/30/15   [provider]  prochlorperazine (COMPAZINE) 10 MG tablet  10/21/15   [provider]  sucralfate (CARAFATE) 1 G tablet Take 1 g by mouth 4 (four) times daily.      [provider]    Social History   Socioeconomic History  . Marital status: Divorced    Spouse name: Not on file  . Number of children: 1  . Years of education: 48  . Highest education level: Not on file  Occupational History  . Occupation: N/A  Social Needs  . Financial resource strain: Not on file  . Food insecurity:    Worry: Not on file    Inability: Not on file  . Transportation needs:    Medical: Not on file    Non-medical: Not on file  Tobacco Use  . Smoking status: Current Every Day Smoker    Packs/day: 1.00    Types: Cigarettes  . Smokeless tobacco: Never Used  Substance and Sexual Activity  . Alcohol  use: Yes    Comment: Couple of drinks per day  . Drug use: No  . Sexual activity: Not on file  Lifestyle  . Physical activity:    Days per week: Not on file    Minutes per session: Not on file  . Stress: Not on file  Relationships  . Social connections:    Talks on phone: Not on file    Gets together: Not on file    Attends religious service: Not on file    Active member of club or organization: Not on file    Attends meetings of clubs or organizations: Not on file    Relationship status: Not on file  . Intimate partner violence:    Fear of current or ex partner: Not on file    Emotionally abused: Not on file    Physically abused: Not on file    Forced sexual activity: Not on file  Other Topics Concern  .  Not on file  Social History Narrative   Lives at home w/ her daughter   Right-handed   Caffeine: 1 cup of coffee     Family History  Problem Relation Age of Onset  . Liver cancer Mother   . Lung cancer Father     ROS: [x]  Positive   [ ]  Negative   [ ]  All sytems reviewed and are negative  Cardiovascular: []  chest pain/pressure []  palpitations [x]  SOB lying flat [x]  DOE []  pain in legs while walking []  pain in legs at rest []  pain in legs at night []  non-healing ulcers []  hx of DVT []  swelling in legs  Pulmonary: []  productive cough []  asthma/wheezing [x]  home O2  Neurologic: []  weakness in []  arms []  legs []  numbness in []  arms []  legs []  hx of CVA []  mini stroke [] difficulty speaking or slurred speech []  temporary loss of vision in one eye []  dizziness  Hematologic: []  hx of cancer []  bleeding problems []  problems with blood clotting easily  Endocrine:   []  diabetes []  thyroid disease  GI []  vomiting blood []  blood in stool  GU: []  CKD/renal failure []  HD--[]  M/W/F or []  T/T/S []  burning with urination []  blood in urine  Psychiatric: []  anxiety []  depression  Musculoskeletal: []  arthritis []  joint pain  Integumentary: []  rashes []  ulcers  Constitutional: []  fever []  chills   Physical Examination  Vitals:   10/30/17 1837 10/30/17 2000  BP: (!) 122/45 (!) 127/94  Pulse: 88 92  Resp: 18   Temp: 98.7 F (37.1 C) 99.8 F (37.7 C)  SpO2: 97% 92%   Body mass index is 27.69 kg/m.  General:  NAD HENT: WNL, normocephalic Pulmonary: normal non-labored breathing Cardiac: regular, without  Murmurs, rubs or gallops Abdomen:soft, NT/ND, no masses Skin: without rashes Vascular Exam/Pulses:  Right Left  Radial 2+ (normal) 2+ (normal)  Ulnar    Femoral 2+ (normal) 2+ (normal)  Popliteal Unable to examine, surgical dressing from septic arthritis 2+ (normal)  DP Biphasic signal   PT Biphasic signal Biphasic signal   Extremities:  without ischemic changes, without Gangrene , without cellulitis; without open wounds; feet are warm. Musculoskeletal: no muscle wasting or atrophy  Neurologic: A&O X 3; Appropriate Affect ; MOTOR FUNCTION:  moving all extremities equally.    CBC    Component Value Date/Time   WBC 13.2 (H) 10/30/2017 2000   RBC 2.84 (L) 10/30/2017 2000   HGB 8.0 (L) 10/30/2017 2000   HCT 25.5 (L) 10/30/2017 2000  PLT 319 10/30/2017 2000   MCV 89.8 10/30/2017 2000   MCH 28.2 10/30/2017 2000   MCHC 31.4 10/30/2017 2000   RDW 15.4 10/30/2017 2000   LYMPHSABS 1.5 10/30/2017 2000   MONOABS 1.1 (H) 10/30/2017 2000   EOSABS 0.1 10/30/2017 2000   BASOSABS 0.0 10/30/2017 2000    BMET    Component Value Date/Time   NA 134 (L) 10/30/2017 2000   K 3.3 (L) 10/30/2017 2000   CL 100 10/30/2017 2000   CO2 25 10/30/2017 2000   GLUCOSE 123 (H) 10/30/2017 2000   BUN 6 (L) 10/30/2017 2000   CREATININE 0.66 10/30/2017 2000   CALCIUM 7.6 (L) 10/30/2017 2000   GFRNONAA >60 10/30/2017 2000   GFRAA >60 10/30/2017 2000    COAGS: No results found for: INR, PROTIME   Non-Invasive Vascular Imaging:   None   ASSESSMENT/PLAN: This is a 84 y.o. female with reported history of a right fem distal bypass and concern for bypass occlusion incidentally noted on CT.  After evaluating her tonight she does not appear to have any immediate limb threat.  She actually has decent posterior tibial and dorsalis pedis signals in her right foot with a stable motor and sensory exam from her normal baseline and her foot is warm with no tissue loss.  My plan will be to obtain repeat ABIs tomorrow to see what her baseline perfusion is at this time.  I have been able unable to find any of her old operative records in care everywhere.  I also need to review her CT scan at [facility] and have been unable to access it on isite.    Marty Heck, MD [facility] Office: [phone] Pager: [phone]

## 2017-10-30 NOTE — Progress Notes (Signed)
Pt states she states she can not find her purse. Pt was transferred from [place] via [place].  Will f/u with  hospital to see if pt left her purse there. Pt did not arrive with purse here at [facility].

## 2017-10-30 NOTE — Progress Notes (Signed)
Pt has arrived on the unit. RN paged MD for orders.

## 2017-10-30 NOTE — H&P (Signed)
History and Physical    Courtney Grant IEP:[id number] DOB: [birth year] DOA: 10/30/2017  PCP: Garwin Brothers, MD   Patient coming from: Home, by way of [facility]   Chief Complaint: Right fem-pop bypass graft occlusion   HPI: Courtney Grant is a 84 y.o. female with medical history significant for COPD, hypertension, history of PE with IVC filter not anticoagulated due to history of bleeding ulcer, peripheral arterial disease with history of right femoral-popliteal bypass graft, and depression, who presented to her orthopedic surgeon's office for evaluation of right knee pain, had aspirate that was purulent, and she was then admitted to [facility] on 10/28/2017.  [facility] Course: She was admitted on 10/28/2017 with right knee pain and purulent aspirate in the outpatient clinic, had CT scan revealed a distal right thigh abscess that was suspected to be an implant infection.  Imaging also was concerning for occlusion of the right femoral-popliteal bypass graft.  In consultation with ID, patient was started on vancomycin and Rocephin.  Synovial fluid and blood cultures grew pansensitive MSSA and she was changed to cefazolin today.  On 10/29/2017, she underwent irrigation of the right knee and I&D of the thigh abscess.  There was no vascular surgery coverage at the outside hospital, Dr. Carlis Abbott vascular surgery was consulted and agreed with transfer to [facility].  She will be admitted to the hospitalist service at [facility] with vascular surgery consultation for suspected occlusion of the right femoral-popliteal bypass grafting.  Review of Systems:  All other systems reviewed and apart from HPI, are negative.  Past Medical History:  Diagnosis Date  . Benign paroxysmal positional vertigo 10/25/2015  . COPD (chronic obstructive pulmonary disease) ([place])   . Lung cancer ([place]) 02/23/2016   ADENOCARINOMA RUL  . Tobacco abuse   . Venous thrombosis    H/O    Past Surgical  History:  Procedure Laterality Date  . LUNG LOBECTOMY  02/23/2006   DR.BURNEY  . REVISION TOTAL HIP ARTHROPLASTY    . THORACOTOMY  02/23/2006   DR.BURNEY  . TIBIA FRACTURE SURGERY       reports that she has been smoking cigarettes. She has been smoking about 1.00 pack per day. She has never used smokeless tobacco. She reports that she drinks alcohol. She reports that she does not use drugs.  Allergies  Allergen Reactions  . Aspirin Nausea And Vomiting  . Codeine Nausea And Vomiting  . Ibuprofen Nausea And Vomiting  . Penicillins Rash    Family History  Problem Relation Age of Onset  . Liver cancer Mother   . Lung cancer Father      Prior to Admission medications   Medication Sig Start Date End Date Taking? Authorizing Provider  dicyclomine (BENTYL) 20 MG tablet Take 20 mg by mouth.    [provider]  gabapentin (NEURONTIN) 100 MG capsule TAKE 1 CAPSULE BY MOUTH AT BEDTIME FOR 3 DAYS, THEN TAKE 1 CAPSULE BY MOUTH TWICE (2) DAILY 07/26/15   [provider]  metoprolol (LOPRESSOR) 50 MG tablet Take 50 mg by mouth daily.      [provider]  mirtazapine (REMERON) 7.5 MG tablet  10/05/15   [provider]  omeprazole (PRILOSEC) 20 MG capsule Take 20 mg by mouth daily.      [provider]  ondansetron (ZOFRAN) 8 MG tablet  10/21/15   [provider]  pantoprazole (PROTONIX) 40 MG tablet  09/30/15   [provider]  prochlorperazine (COMPAZINE) 10 MG  tablet  10/21/15   [provider]  sucralfate (CARAFATE) 1 G tablet Take 1 g by mouth 4 (four) times daily.      [provider]    Physical Exam: Vitals:   10/30/17 1837  BP: (!) 122/45  Pulse: 88  Resp: 18  Temp: 98.7 F (37.1 C)  TempSrc: Oral  SpO2: 97%      Constitutional: NAD, calm  Eyes: PERTLA, lids and conjunctivae normal ENMT: Mucous membranes are parched. Posterior pharynx clear of any exudate or lesions.   Neck: normal, supple, no  masses, no thyromegaly Respiratory: Diminished breath sounds bilaterally, no wheezing, no crackles. Normal respiratory effort.   Cardiovascular: S1 & S2 heard, regular rate and rhythm. No extremity edema.   Abdomen: No distension, no tenderness, soft. Bowel sounds normal.  Musculoskeletal: no clubbing / cyanosis. Right knee tenderness and surgical incisions.    Skin: no significant rashes, lesions, ulcers. No acrocyanosis. Neurologic: No gross facial asymmetry. Mild dysarthria. Sensation intact. Strength 5/5 in all 4 limbs.  Psychiatric: Alert and oriented to person, place, and situation. Pleasant and cooperative.    Labs on Admission: I have personally reviewed following labs and imaging studies  CBC: No results for input(s): WBC, NEUTROABS, HGB, HCT, MCV, PLT in the last 168 hours. Basic Metabolic Panel: No results for input(s): NA, K, CL, CO2, GLUCOSE, BUN, CREATININE, CALCIUM, MG, PHOS in the last 168 hours. GFR: CrCl cannot be calculated (Patient's most recent lab result is older than the maximum 21 days allowed.). Liver Function Tests: No results for input(s): AST, ALT, ALKPHOS, BILITOT, PROT, ALBUMIN in the last 168 hours. No results for input(s): LIPASE, AMYLASE in the last 168 hours. No results for input(s): AMMONIA in the last 168 hours. Coagulation Profile: No results for input(s): INR, PROTIME in the last 168 hours. Cardiac Enzymes: No results for input(s): CKTOTAL, CKMB, CKMBINDEX, TROPONINI in the last 168 hours. BNP (last 3 results) No results for input(s): PROBNP in the last 8760 hours. HbA1C: No results for input(s): HGBA1C in the last 72 hours. CBG: No results for input(s): GLUCAP in the last 168 hours. Lipid Profile: No results for input(s): CHOL, HDL, LDLCALC, TRIG, CHOLHDL, LDLDIRECT in the last 72 hours. Thyroid Function Tests: No results for input(s): TSH, T4TOTAL, FREET4, T3FREE, THYROIDAB in the last 72 hours. Anemia Panel: No results for input(s):  VITAMINB12, FOLATE, FERRITIN, TIBC, IRON, RETICCTPCT in the last 72 hours. Urine analysis: No results found for: COLORURINE, APPEARANCEUR, LABSPEC, PHURINE, GLUCOSEU, HGBUR, BILIRUBINUR, KETONESUR, PROTEINUR, UROBILINOGEN, NITRITE, LEUKOCYTESUR Sepsis Labs: @LABRCNTIP (procalcitonin:4,lacticidven:4) )No results found for this or any previous visit (from the past 240 hour(s)).   Radiological Exams on Admission: No results found.  EKG: Sinus rhythm with sinus arrhythmia.   Assessment/Plan  1. Right femoral-popliteal bypass graft occlusion  - CT at outside hospital concerning for occlusion of right fem-pop bypass  - Vascular surgeon at [facility] agreed to see patient here at [facility] in consultation; they have been notified of her arrival, will follow-up on recommendations    2. Septic arthritis of right knee; right thigh abscess; MSSA bacteremia  - She underwent I&D right knee and abscess on 8/16, treated initially with vancomycin and Rocephin, changed to cefazolin 8/17 when blood and synovial fluid cultures grew MSSA   - WBC 10.7 at outside hospital on 8/17, decreased from 12.6 on admission; she is afebrile  - Per ortho, keep non-weight bearing on RLE for 4 weeks, monitor for recurrent abscess and septic joint, and continue PT  -  Continue cefazolin    3. Hypertension  - BP at goal - Continue Lopressor as tolerated    4. History of PE  - Not anticoagulated d/t hx of bleeding PUD  - She has IVC filter    5. Depression  - Stable  - Continue Elavil, Buspar, Zoloft, and trazodone    6. Elevated LFT's  - Transaminases in 100-range at outside hospital  - She had negative viral hepatitis panel at outside hospital  - Abd exam is benign  - Possibly medication related (Elavil, Lipitor), more likely steatosis  - Trend, check RUQ [place]   7. COPD  - No wheezing or SOB  - Continue prn albuterol   8. Anemia  - Hgb 10.0 on admission, down to 8.6 on 10/17  - Likely secondary to chronic disease  and surgery on 8/16  - No acute bleeding  - Type and screen, trend H&H    DVT prophylaxis: sq heparin  Code Status: Full  Family Communication: Discussed with patient  Consults called: Vascular surgery  Admission status: Inpatient     Vianne Bulls, MD [facility] Pager [phone]  If 7PM-7AM, please contact night-coverage [email or web address] Password [facility]  10/30/2017, 7:32 PM

## 2017-10-31 ENCOUNTER — Inpatient Hospital Stay (HOSPITAL_COMMUNITY): Payer: Medicare Other

## 2017-10-31 DIAGNOSIS — I739 Peripheral vascular disease, unspecified: Secondary | ICD-10-CM

## 2017-10-31 LAB — SEDIMENTATION RATE: Sed Rate: >140 mm/hr — ABNORMAL HIGH (ref 0–22)

## 2017-10-31 MED ORDER — PANTOPRAZOLE SODIUM 40 MG PO TBEC: 40.0000 mg | DELAYED_RELEASE_TABLET | Freq: Every day | ORAL | Status: DC

## 2017-10-31 MED ORDER — POTASSIUM CHLORIDE CRYS ER 20 MEQ PO TBCR
40.0000 meq | EXTENDED_RELEASE_TABLET | Freq: Once | ORAL | Status: AC
  Administered 2017-10-31: 40 meq via ORAL
  Filled 2017-10-31: qty 2

## 2017-10-31 MED ORDER — METOPROLOL TARTRATE 50 MG PO TABS
50.0000 mg | ORAL_TABLET | Freq: Two times a day (BID) | ORAL | Status: DC
  Administered 2017-10-31 – 2017-11-01 (×2): 50 mg via ORAL
  Filled 2017-10-31 (×2): qty 1

## 2017-10-31 MED ORDER — HYDRALAZINE HCL 20 MG/ML IJ SOLN: 10.0000 mg | Freq: Four times a day (QID) | INTRAMUSCULAR | Status: DC | PRN

## 2017-10-31 MED ORDER — CLOPIDOGREL BISULFATE 75 MG PO TABS
75.0000 mg | ORAL_TABLET | Freq: Every day | ORAL | Status: DC
  Administered 2017-10-31 – 2017-11-11 (×12): 75 mg via ORAL
  Filled 2017-10-31 (×12): qty 1

## 2017-10-31 NOTE — Progress Notes (Signed)
Patient Demographics:    Courtney Grant, is a 84 y.o. female, DOB - 07-18-1932, KAJ:[id number]  Admit date - 10/30/2017   Admitting Physician Vianne Bulls, MD  Outpatient Primary MD for the patient is Garwin Brothers, MD  LOS - 1   No chief complaint on file.       Subjective:    Courtney Grant today has no fevers, no emesis,  No chest pain,   Daughter in room, , Pharmacy tech at bedside   Assessment  & Plan :    Principal Problem:   Occlusion of right femoral-popliteal bypass graft ([place]) Active Problems:   COPD (chronic obstructive pulmonary disease) (HCC)   Depression   Hypertension   History of pulmonary embolism   MSSA bacteremia   Chronic back pain   Normocytic anemia   Septic arthritis of knee, right (HCC)   Occlusion of right femoropopliteal bypass graft (HCC)   History of bleeding peptic ulcer   Abnormal transaminases   Brief Summary:- 84 y.o. female with pmhx of COPD, hypertension, history of PE with IVC filter not anticoagulated due to history of bleeding ulcer, peripheral arterial disease with history of right femoral-popliteal bypass graft, and depression, who presented to her orthopedic surgeon's office for evaluation of right knee pain, CT scan revealed a distal right thigh abscess that was suspected to be an implant infection, she had aspirate that was purulent, and she was then admitted to [facility] on 10/28/2017. Transferred to [facility] on 10/30/17 due to concerns about right fem-pop bypass graft occlusion  with Dr. Carlis Abbott from vascular surgery consulting. Initially treated with Vanco/Zosyn, however joint fluid culture grew MSSA, patient was changed to IV Ancef on 10/30/2017.  ABI on 11-16-17 shows moderate to severe reduction in the arterial flow with abnormal TBI's    Plan:- 1)MSSA Septic Arthritis of the Right Knee--- please see above, s/p I and D/washout 10/29/17,  Per ortho,  keep non-weight bearing on RLE for 4 weeks continue IV Ancef that was started on 10/30/2017  2)PAD-- Right Fem-Pop bypass graft occlusion  on imaging studies, ???? Chronicity, ABI on 16-Nov-2017 shows moderate to severe reduction in the arterial flow with abnormal TBI's, Vascular surgery consult from Dr. Carlis Abbott appreciated, await further recommendations from vascular surgery team, continue  Lipitor, restart  Plavix   3)HTN--- stable, change metoprolol tartrate to 50 mg twice daily,   may use IV Hydralazine 10 mg  Every 4 hours Prn for systolic blood pressure over 160 mmhg  4)Depression--- stable, continue Zoloft 200 mg daily and buspirone 7.5 mg twice daily, trazodone and Elavil for sleep  5) elevated LFTs--- acute hepatitis profile was negative at outside facility, need to closely and consider stopping Lipitor, right upper quadrant ultrasound pending  6)H/o PE--- no anticoagulation due to history of bleeding from peptic ulcers, IVC in situ  7) acute on chronic anemia--recent hemoglobin apparently usually 10, monitor closely and transfuse as clinically indicated, suspect acute blood loss due to orthopedic procedures superimposed on anemia of chronic disease.  Given history of prior GI bleed if H&H continues to drop consider further GI work-up, give PPI  Code Status : Full  Disposition Plan  :  ??? SNF rehab and  for IV antibiotics  Consults  :  Vascular surgery   DVT Prophylaxis  :  - Heparin -   Lab Results  Component Value Date   PLT 319 10/30/2017    Inpatient Medications  Scheduled Meds: . amitriptyline  25 mg Oral QHS  . atorvastatin  20 mg Oral q1800  . busPIRone  7.5 mg Oral BID  . dicyclomine  20 mg Oral TID AC  . gabapentin  100 mg Oral BID  . heparin  5,000 Units Subcutaneous Q8H  . metoprolol tartrate  50 mg Oral Daily  . pantoprazole  40 mg Oral Daily  . sertraline  100 mg Oral Daily  . sucralfate  1 g Oral TID WC & HS  . traZODone  50 mg Oral QHS  . vitamin B-12   1,000 mcg Oral Daily   Continuous Infusions: .  ceFAZolin (ANCEF) IV 2 g (10/31/17 0612)   PRN Meds:.acetaminophen **OR** acetaminophen, albuterol, morphine injection, ondansetron **OR** ondansetron (ZOFRAN) IV, oxyCODONE-acetaminophen, senna-docusate    Anti-infectives (From admission, onward)   Start     Dose/Rate Route Frequency Ordered Stop   10/30/17 2130  ceFAZolin (ANCEF) IVPB 2g/100 mL premix     2 g 200 mL/hr over 30 Minutes Intravenous Every 8 hours 10/30/17 2116          Objective:   Vitals:   10/31/17 0500 10/31/17 0554 10/31/17 0902 10/31/17 0915  BP:  (!) 133/54 (!) 151/58 (!) 151/58  Pulse:  71 88 88  Resp:      Temp:  97.6 F (36.4 C)    TempSrc:  Axillary    SpO2:  99%    Weight: 70.4 kg     Height:        Wt Readings from Last 3 Encounters:  10/31/17 70.4 kg  10/25/15 52.8 kg  12/09/10 56.2 kg     Intake/Output Summary (Last 24 hours) at 10/31/2017 1235 Last data filed at 10/31/2017 0600 Gross per 24 hour  Intake 374.74 ml  Output 1050 ml  Net -675.26 ml     Physical Exam  Gen:- Awake Alert,  In no apparent distress  HEENT:- [place].AT, No sclera icterus Neck-Supple Neck,No JVD,.  Lungs-  CTAB , good air movement CV- S1, S2 normal, regular  Abd-  +ve B.Sounds, Abd Soft, No tenderness,    Extremity/Skin:-Varus deformity of the left knee, right lower extremity slightly swollen, Rt knee area with surgical incisions that are clean dry and intact, Psych-affect is appropriate, oriented x3 Neuro-no new focal deficits (patient has mild dysarthria which is not new), no tremors   Data Review:   Micro Results No results found for this or any previous visit (from the past 240 hour(s)).  Radiology Reports US Abdomen Limited Ruq  Result Date: 10/30/2017 CLINICAL DATA:  Abnormal liver function studies. Previous cholecystectomy. EXAM: ULTRASOUND ABDOMEN LIMITED RIGHT UPPER QUADRANT COMPARISON:  Intraoperative cholangiogram 08/16/2015 FINDINGS:  Gallbladder: Gallbladder is surgically absent. No fluid or mass demonstrated in the gallbladder fossa. Common bile duct: Diameter: 6.8 mm, normal Liver: Somewhat limited visualization due to rib shadowing. Liver parenchymal echotexture appears to be normal and homogeneous. No focal lesions are identified. Portal vein is patent on color Doppler imaging with normal direction of blood flow towards the liver. IMPRESSION: 1. Surgical absence of the gallbladder.  No bile duct dilatation. 2. Homogeneous appearance of the liver. No focal lesions identified. Electronically Signed   By: Lucienne Capers M.D.   On: 10/30/2017 23:46     CBC Recent Labs  Lab 10/30/17 2000  WBC  13.2*  HGB 8.0*  HCT 25.5*  PLT 319  MCV 89.8  MCH 28.2  MCHC 31.4  RDW 15.4  LYMPHSABS 1.5  MONOABS 1.1*  EOSABS 0.1  BASOSABS 0.0    Chemistries  Recent Labs  Lab 10/30/17 2000  NA 134*  K 3.3*  CL 100  CO2 25  GLUCOSE 123*  BUN 6*  CREATININE 0.66  CALCIUM 7.6*  AST 103*  ALT 90*  ALKPHOS 94  BILITOT 0.4   ------------------------------------------------------------------------------------------------------------------ No results for input(s): CHOL, HDL, LDLCALC, TRIG, CHOLHDL, LDLDIRECT in the last 72 hours.  No results found for: HGBA1C ------------------------------------------------------------------------------------------------------------------ No results for input(s): TSH, T4TOTAL, T3FREE, THYROIDAB in the last 72 hours.  Invalid input(s): FREET3 ------------------------------------------------------------------------------------------------------------------ No results for input(s): VITAMINB12, FOLATE, FERRITIN, TIBC, IRON, RETICCTPCT in the last 72 hours.  Coagulation profile No results for input(s): INR, PROTIME in the last 168 hours.  No results for input(s): DDIMER in the last 72 hours.  Cardiac Enzymes No results for input(s): CKMB, TROPONINI, MYOGLOBIN in the last 168  hours.  Invalid input(s): CK ------------------------------------------------------------------------------------------------------------------ No results found for: BNP   Roxan Hockey M.D on 10/31/2017 at 12:35 PM   Go to [email or web address] - password [email or web address] for contact info  [facility] - Office  [phone]

## 2017-10-31 NOTE — Evaluation (Signed)
Physical Therapy Evaluation Patient Details Name: Courtney Grant MRN: [id number] DOB: March 04, 1933 Today's Date: 10/31/2017   History of Present Illness  84 y.o. female with medical history significant for COPD, hypertension, history of PE with IVC filter not anticoagulated due to history of bleeding ulcer, peripheral arterial disease with history of right femoral-popliteal bypass graft, and depression, who presented to her orthopedic surgeon's office for evaluation of right knee pain, had aspirate that was purulent, and she was then admitted to [facility] on 10/28/2017. On admission, CT scan revealed a distal right thigh abscess that was suspected to be an implant infection.  Imaging also was concerning for occlusion of the right femoral-popliteal bypass graft. Pt was transfered to [facility] for vascular surgery consult.     Clinical Impression  Pt admitted with above diagnosis. Pt currently with functional limitations due to the deficits listed below (see PT Problem List). PTA pt ambulated with RW mod I. On eval, she required min assist bed mobility. Pt declining transfers due to pain. Pt's mobility will be significantly limited by NWB status RLE. Pt will benefit from skilled PT to increase their independence and safety with mobility to allow discharge to the venue listed below.       Follow Up Recommendations SNF    Equipment Recommendations  Other (comment)(defer to next venue)    Recommendations for Other Services       Precautions / Restrictions Precautions Precautions: Fall Restrictions RLE Weight Bearing: Non weight bearing Other Position/Activity Restrictions: x 4 weeks per Dr. Talmadge Coventry progress note      Mobility  Bed Mobility Overal bed mobility: Needs Assistance Bed Mobility: Supine to Sit;Sit to Supine     Supine to sit: Min assist;HOB elevated Sit to supine: Min assist   General bed mobility comments: +rail, cues for sequencing; Pt able to scoot along EOB in  sitting with min assist for better positioning with return to supine  Transfers                 General transfer comment: Pt declining due to pain.  Ambulation/Gait                Stairs            Wheelchair Mobility    Modified Rankin (Stroke Patients Only)       Balance Overall balance assessment: Needs assistance Sitting-balance support: Feet unsupported;No upper extremity supported Sitting balance-Leahy Scale: Fair Sitting balance - Comments: static                                     Pertinent Vitals/Pain Pain Assessment: Faces Faces Pain Scale: Hurts little more Pain Location: R knee Pain Descriptors / Indicators: Grimacing;Sore;Guarding Pain Intervention(s): Monitored during session;Limited activity within patient's tolerance;Repositioned    Home Living Family/patient expects to be discharged to:: Private residence Living Arrangements: Children Available Help at Discharge: Family;Available PRN/intermittently(daughter works during the day) Type of Home: House Home Access: Stairs to enter Entrance Stairs-Rails: None Technical brewer of Steps: 3 Home Layout: One level Home Equipment: Environmental consultant - 2 wheels;Bedside commode      Prior Function Level of Independence: Needs assistance   Gait / Transfers Assistance Needed: ambulates with RW, enters/exits house steps with one person on either side for assist  ADL's / Homemaking Assistance Needed: needs occasional assist with bathing and dressing        Hand Dominance  Extremity/Trunk Assessment   Upper Extremity Assessment Upper Extremity Assessment: Overall WFL for tasks assessed    Lower Extremity Assessment Lower Extremity Assessment: RLE deficits/detail RLE Deficits / Details: knee resting in ~ 20 degrees of flexion, unable to tolerate knee ROM    Cervical / Trunk Assessment Cervical / Trunk Assessment: Kyphotic  Communication   Communication: No  difficulties  Cognition Arousal/Alertness: Awake/alert Behavior During Therapy: WFL for tasks assessed/performed Overall Cognitive Status: Within Functional Limits for tasks assessed                                        General Comments      Exercises     Assessment/Plan    PT Assessment Patient needs continued PT services  PT Problem List Decreased strength;Decreased mobility;Decreased range of motion;Decreased activity tolerance;Decreased balance;Pain       PT Treatment Interventions Therapeutic activities;Gait training;Therapeutic exercise;Patient/family education;Balance training;Functional mobility training    PT Goals (Current goals can be found in the Care Plan section)  Acute Rehab PT Goals Patient Stated Goal: home PT Goal Formulation: With patient Time For Goal Achievement: 11/14/17 Potential to Achieve Goals: Fair    Frequency Min 3X/week   Barriers to discharge        Co-evaluation               AM-PAC PT "6 Clicks" Daily Activity  Outcome Measure Difficulty turning over in bed (including adjusting bedclothes, sheets and blankets)?: A Lot Difficulty moving from lying on back to sitting on the side of the bed? : A Lot Difficulty sitting down on and standing up from a chair with arms (e.g., wheelchair, bedside commode, etc,.)?: Unable Help needed moving to and from a bed to chair (including a wheelchair)?: A Lot Help needed walking in hospital room?: Total Help needed climbing 3-5 steps with a railing? : Total 6 Click Score: 9    End of Session   Activity Tolerance: Patient limited by pain Patient left: in bed;with call bell/phone within reach;with bed alarm set Nurse Communication: Mobility status PT Visit Diagnosis: Other abnormalities of gait and mobility (R26.89);Pain Pain - Right/Left: Right Pain - part of body: Knee    Time: 1425-1440 PT Time Calculation (min) (ACUTE ONLY): 15 min   Charges:   PT Evaluation $PT Eval  Low Complexity: 1 Low          Lorrin Goodell, PT  Office # [phone] Pager [phone]   Lorriane Shire 10/31/2017, 4:01 PM

## 2017-10-31 NOTE — Plan of Care (Signed)
Problem: Education: Goal: Knowledge of General Education information will improve Description Including pain rating scale, medication(s)/side effects and non-pharmacologic comfort measures Outcome: Progressing   Problem: Clinical Measurements: Goal: Diagnostic test results will improve Outcome: Progressing   Problem: Activity: Goal: Risk for activity intolerance will decrease Outcome: Progressing   Problem: Nutrition: Goal: Adequate nutrition will be maintained Outcome: Progressing   Problem: Coping: Goal: Level of anxiety will decrease Outcome: Progressing   Problem: Pain Managment: Goal: General experience of comfort will improve Outcome: Progressing   Problem: Safety: Goal: Ability to remain free from injury will improve Outcome: Progressing   Problem: Skin Integrity: Goal: Risk for impaired skin integrity will decrease Outcome: Progressing

## 2017-10-31 NOTE — Progress Notes (Signed)
PT Cancellation Note  Patient Details Name: Courtney Grant MRN: [id number] DOB: [birth year]   Cancelled Treatment:    Reason Eval/Treat Not Completed: Patient at procedure or test/unavailable. Pt in vascular lab for ABI.    Lorriane Shire 10/31/2017, 9:25 AM  Lorrin Goodell, PT  Office # [phone] Pager [phone]

## 2017-10-31 NOTE — Progress Notes (Signed)
Vascular Surgery Updated Progress Note  I reviewed her noninvasive imaging and she has an ABI of 0.5 in the right foot with a reduced toe pressure of 18.  Will obtain vein mapping to see what options she has for additional conduit.  Will likely get updated arteriogram this week.  Would favor n.p.o. after midnight tonight in case we have time tomorrow.  Pending her options for conduit would probably favor letting her go home after an arteriogram.  I think she had a subclinical thrombosis of her bypass given no worsening symptoms in her foot.  Certainly no severe rest pain on my evaluation.  I only noted a very small ulcer between her first and second toe. Would likely allow her to recover from the septic arthritis prior to any more intervention from our standpoint since any intervention would likely include a fem distal through the infected field.  Marty Heck

## 2017-10-31 NOTE — Progress Notes (Signed)
[facility]   Subjective  -  84 year old female recently admitted to [facility] for right knee septic joint.  She was transferred here for vascular surgery evaluation given an incidental finding on a CT of a right thumb distal bypass occlusion.  On evaluation this morning again has baseline symptoms that she describes as some numbness in her right leg for over 2 years duration that is unchanged with a normal motor exam.   Objective (!) 133/54 71 97.6 F (36.4 C) (Axillary) 18 99%  Intake/Output Summary (Last 24 hours) at 10/31/2017 0844 Last data filed at 10/31/2017 0600 Gross per 24 hour  Intake 374.74 ml  Output 1050 ml  Net -675.26 ml   PE General: NAD, resting Extremities: Palpable femoral pulses bilateral groins DP/PT signals RLE biphasic, PT signal LLE Motor intact, right foot warm  Assessment/Planning:  84 year old female with septic right knee joint found to have incidental occlusion of an old right fem distal bypass on CT.  I was able to review her CT in isite this morning and indeed she has a what appears to be a right fem to above-knee bypass graft that is occluded.  Unclear chronicity of occludsion given that she has had no change in her baseline symptoms over the last several years and no reliable follow-up since bypass done at OSH.  We will obtain updated ABIs and toe pressures today to evaluate her baseline perfusion at this time.  Did recommend to the nurse to put toe floss between her first and second toe on the right foot where she has a little pressure ulcer between the toes (fold a piece of gauze between the toes).  We can make a decision regarding if she will need anything during his hospitalization pending her noninvasive imaging.   Marty Heck 10/31/2017 8:44 AM -- Marty Heck, MD [facility] [place] Office: [phone] Pager: [phone]

## 2017-10-31 NOTE — Progress Notes (Signed)
VASCULAR LAB PRELIMINARY  ARTERIAL  ABI completed: Bilateral ABIs indicate moderate to severe reduction in arterial flow with abnormal TBIs.      RIGHT    LEFT    PRESSURE WAVEFORM  PRESSURE WAVEFORM  BRACHIAL 150 T BRACHIAL 147 T  DP 0 Absent DP 80 M  AT   AT    PT 81 M PT 96 M  PER   PER    GREAT TOE 0.12 NA GREAT TOE 0.53 NA    RIGHT LEFT  ABI 0.54 0.53     Ritik Stavola, RVT 10/31/2017, 10:00 AM

## 2017-11-01 ENCOUNTER — Encounter (HOSPITAL_COMMUNITY)
Admission: AD | Disposition: A | Discharge status: Skilled Nursing Facility | Payer: Self-pay | Attending: Internal Medicine

## 2017-11-01 ENCOUNTER — Encounter (HOSPITAL_COMMUNITY): Payer: Medicare Other

## 2017-11-01 ENCOUNTER — Encounter (HOSPITAL_COMMUNITY): Payer: Self-pay | Admitting: General Practice

## 2017-11-01 DIAGNOSIS — I998 Other disorder of circulatory system: Secondary | ICD-10-CM

## 2017-11-01 HISTORY — PX: ABDOMINAL AORTOGRAM W/LOWER EXTREMITY: CATH118223

## 2017-11-01 LAB — CBC
HCT: 27.3 % — ABNORMAL LOW (ref 36.0–46.0)
Hemoglobin: 8.3 g/dL — ABNORMAL LOW (ref 12.0–15.0)
MCH: 27.7 pg (ref 26.0–34.0)
MCHC: 30.4 g/dL (ref 30.0–36.0)
MCV: 91 fL (ref 78.0–100.0)
Platelets: 380 10*3/uL (ref 150–400)
RBC: 3 MIL/uL — ABNORMAL LOW (ref 3.87–5.11)
RDW: 15.6 % — ABNORMAL HIGH (ref 11.5–15.5)
WBC: 13.1 10*3/uL — ABNORMAL HIGH (ref 4.0–10.5)

## 2017-11-01 LAB — COMPREHENSIVE METABOLIC PANEL
ALT: 62 U/L — ABNORMAL HIGH (ref 0–44)
AST: 65 U/L — ABNORMAL HIGH (ref 15–41)
Albumin: 1.8 g/dL — ABNORMAL LOW (ref 3.5–5.0)
Alkaline Phosphatase: 85 U/L (ref 38–126)
Anion gap: 8 (ref 5–15)
BUN: 9 mg/dL (ref 8–23)
CO2: 25 mmol/L (ref 22–32)
Calcium: 7.7 mg/dL — ABNORMAL LOW (ref 8.9–10.3)
Chloride: 106 mmol/L (ref 98–111)
Creatinine, Ser: 0.63 mg/dL (ref 0.44–1.00)
GFR calc Af Amer: >60 mL/min (ref >60)
GFR calc non Af Amer: >60 mL/min (ref >60)
Glucose, Bld: 128 mg/dL — ABNORMAL HIGH (ref 70–99)
Potassium: 3.6 mmol/L (ref 3.5–5.1)
Sodium: 139 mmol/L (ref 135–145)
Total Bilirubin: 0.4 mg/dL (ref 0.3–1.2)
Total Protein: 5.6 g/dL — ABNORMAL LOW (ref 6.5–8.1)

## 2017-11-01 SURGERY — ABDOMINAL AORTOGRAM W/LOWER EXTREMITY
Anesthesia: LOCAL

## 2017-11-01 MED ORDER — LABETALOL HCL 5 MG/ML IV SOLN: 10.0000 mg | INTRAVENOUS | Status: DC | PRN

## 2017-11-01 MED ORDER — ACETAMINOPHEN 325 MG PO TABS
650.0000 mg | ORAL_TABLET | ORAL | Status: DC | PRN
  Administered 2017-11-02: 650 mg via ORAL
  Filled 2017-11-01: qty 2

## 2017-11-01 MED ORDER — CEFAZOLIN SODIUM-DEXTROSE 2-4 GM/100ML-% IV SOLN
INTRAVENOUS | Status: AC
  Filled 2017-11-01: qty 100

## 2017-11-01 MED ORDER — LIDOCAINE HCL (PF) 1 % IJ SOLN
INTRAMUSCULAR | Status: AC
  Filled 2017-11-01: qty 30

## 2017-11-01 MED ORDER — HYDRALAZINE HCL 20 MG/ML IJ SOLN: 5.0000 mg | INTRAMUSCULAR | Status: DC | PRN

## 2017-11-01 MED ORDER — HEPARIN (PORCINE) IN NACL 1000-0.9 UT/500ML-% IV SOLN
INTRAVENOUS | Status: DC | PRN
  Administered 2017-11-01 (×2): 500 mL

## 2017-11-01 MED ORDER — FENTANYL CITRATE (PF) 100 MCG/2ML IJ SOLN
INTRAMUSCULAR | Status: AC
  Filled 2017-11-01: qty 2

## 2017-11-01 MED ORDER — MORPHINE SULFATE (PF) 4 MG/ML IV SOLN
INTRAVENOUS | Status: AC
  Filled 2017-11-01: qty 1

## 2017-11-01 MED ORDER — SODIUM CHLORIDE 0.9 % IV SOLN
INTRAVENOUS | Status: DC
  Administered 2017-11-01 (×2): via INTRAVENOUS

## 2017-11-01 MED ORDER — HEPARIN (PORCINE) IN NACL 1000-0.9 UT/500ML-% IV SOLN
INTRAVENOUS | Status: AC
  Filled 2017-11-01: qty 500

## 2017-11-01 MED ORDER — SODIUM CHLORIDE 0.9% FLUSH: 3.0000 mL | INTRAVENOUS | Status: DC | PRN

## 2017-11-01 MED ORDER — IODIXANOL 320 MG/ML IV SOLN
INTRAVENOUS | Status: DC | PRN
  Administered 2017-11-01: 115 mL via INTRA_ARTERIAL

## 2017-11-01 MED ORDER — SODIUM CHLORIDE 0.9 % IV SOLN
250.0000 mL | INTRAVENOUS | Status: DC | PRN
  Administered 2017-11-03: 10 mL via INTRAVENOUS
  Administered 2017-11-05 – 2017-11-09 (×3): 250 mL via INTRAVENOUS

## 2017-11-01 MED ORDER — SODIUM CHLORIDE 0.9 % WEIGHT BASED INFUSION: 1.0000 mL/kg/h | INTRAVENOUS | Status: AC

## 2017-11-01 MED ORDER — ONDANSETRON HCL 4 MG/2ML IJ SOLN
4.0000 mg | Freq: Four times a day (QID) | INTRAMUSCULAR | Status: DC | PRN
  Administered 2017-11-02: 4 mg via INTRAVENOUS

## 2017-11-01 MED ORDER — FENTANYL CITRATE (PF) 100 MCG/2ML IJ SOLN
INTRAMUSCULAR | Status: DC | PRN
  Administered 2017-11-01 (×3): 25 ug via INTRAVENOUS

## 2017-11-01 MED ORDER — METOPROLOL TARTRATE 50 MG PO TABS
50.0000 mg | ORAL_TABLET | Freq: Two times a day (BID) | ORAL | Status: DC
  Administered 2017-11-01 – 2017-11-11 (×20): 50 mg via ORAL
  Filled 2017-11-01 (×8): qty 1
  Filled 2017-11-01: qty 2
  Filled 2017-11-01 (×2): qty 1
  Filled 2017-11-01: qty 2
  Filled 2017-11-01: qty 1
  Filled 2017-11-01: qty 2
  Filled 2017-11-01 (×5): qty 1
  Filled 2017-11-01: qty 2
  Filled 2017-11-01 (×5): qty 1

## 2017-11-01 MED ORDER — CEFAZOLIN SODIUM-DEXTROSE 2-4 GM/100ML-% IV SOLN
2.0000 g | Freq: Three times a day (TID) | INTRAVENOUS | Status: DC
  Administered 2017-11-01 – 2017-11-11 (×31): 2 g via INTRAVENOUS
  Filled 2017-11-01 (×32): qty 100

## 2017-11-01 MED ORDER — OXYCODONE-ACETAMINOPHEN 5-325 MG PO TABS
ORAL_TABLET | ORAL | Status: AC
  Filled 2017-11-01: qty 1

## 2017-11-01 MED ORDER — SODIUM CHLORIDE 0.9% FLUSH
3.0000 mL | Freq: Two times a day (BID) | INTRAVENOUS | Status: DC
  Administered 2017-11-01 – 2017-11-07 (×8): 3 mL via INTRAVENOUS

## 2017-11-01 MED ORDER — LIDOCAINE HCL (PF) 1 % IJ SOLN
INTRAMUSCULAR | Status: DC | PRN
  Administered 2017-11-01: 15 mL

## 2017-11-01 MED FILL — Hydromorphone HCl Inj 2 MG/ML: INTRAMUSCULAR | Qty: 1 | Status: AC

## 2017-11-01 SURGICAL SUPPLY — 9 items
CATH OMNI FLUSH 5F 65CM (CATHETERS) ×2 IMPLANT
KIT MICROPUNCTURE NIT STIFF (SHEATH) ×1 IMPLANT
KIT PV (KITS) ×2 IMPLANT
SHEATH PINNACLE 5F 10CM (SHEATH) ×1 IMPLANT
SHEATH PROBE COVER 6X72 (BAG) ×2 IMPLANT
SYR MEDRAD MARK V 150ML (SYRINGE) ×2 IMPLANT
TRANSDUCER W/STOPCOCK (MISCELLANEOUS) ×2 IMPLANT
TRAY PV CATH (CUSTOM PROCEDURE TRAY) ×2 IMPLANT
WIRE BENTSON .035X145CM (WIRE) ×1 IMPLANT

## 2017-11-01 NOTE — Progress Notes (Signed)
[facility]   Subjective  -  84 year old female recently admitted to [facility] for right knee septic joint.  She was transferred here for vascular surgery evaluation given an incidental finding on a CT of a right fem distal bypass occlusion.  On evaluation this morning again has baseline symptoms that she describes as some numbness in her right leg for over 2 years duration that is unchanged with a normal motor exam.  ABI 0.5, TP 18.    Objective (!) 137/59 87 99.3 F (37.4 C) (Oral) 18 100% No intake or output data in the 24 hours ending 11/01/17 0907 PE General: NAD, resting Extremities: Palpable femoral pulses bilateral groins DP/PT signals RLE, PT signal LLE Motor intact, right foot warm  Assessment/Planning:  84 year old female with septic right knee joint found to have incidental occlusion of an old right fem distal bypass on CT.   Noninvasive imaging yesterday showed a reduced ABI 0.5 with a toe pressure of 18 in the right foot.  We will obtain vein mapping today to see what other conduit she has for any future bypass revision.  My partner Dr. Donzetta Matters will try and get heron the schedule today for a lower extremity arteriogram to see what options she has moving forward.  No significant rest pain.  Has a small ulcer between first and second toe that will be her biggest risk.   Marty Heck 11/01/2017 9:07 AM -- Marty Heck, MD [facility] [place] Office: [phone] Pager: [phone]

## 2017-11-01 NOTE — Progress Notes (Signed)
Physical Therapy Treatment Patient Details Name: Courtney Grant MRN: [id number] DOB: March 03, 1933 Today's Date: 11/01/2017    History of Present Illness 84 y.o. female with medical history significant for COPD, hypertension, history of PE with IVC filter not anticoagulated due to history of bleeding ulcer, peripheral arterial disease with history of right femoral-popliteal bypass graft, and depression, who presented to her orthopedic surgeon's office for evaluation of right knee pain, had aspirate that was purulent, and she was then admitted to [facility] on 10/28/2017. On admission, CT scan revealed a distal right thigh abscess that was suspected to be an implant infection.  Imaging also was concerning for occlusion of the right femoral-popliteal bypass graft. Pt was transfered to [facility] for vascular surgery consult.     PT Comments    Pt NPO this AM, pending possible procedure. She was received in bed and agreeable to OOB to recliner. Pt required min assist supine to sit and mod assist stand-pivot transfers. Increased time required to complete mobility. Pt with difficulty maintaining NWB RLE. Pt stating she does not want to go to a rest home. Educated pt on her need for increased assistance and no help available at home during the day.     Follow Up Recommendations  SNF     Equipment Recommendations  Other (comment)(will need wheelchair, if she discharges home)    Recommendations for Other Services       Precautions / Restrictions Precautions Precautions: Fall Restrictions RLE Weight Bearing: Non weight bearing Other Position/Activity Restrictions: x 4 weeks per ortho-Dr. Talmadge Coventry progress note 10-31-17    Mobility  Bed Mobility Overal bed mobility: Needs Assistance Bed Mobility: Supine to Sit     Supine to sit: HOB elevated;Min assist     General bed mobility comments: +rail, cues for sequencing, increased time and effort  Transfers Overall transfer level: Needs  assistance Equipment used: None Transfers: Stand Pivot Transfers   Stand pivot transfers: Mod assist       General transfer comment: SPT bed to recliner toward the left. Pt having difficulty maintaining NWB RLE.  Ambulation/Gait             General Gait Details: unable due to pain/NWB status   Stairs             Wheelchair Mobility    Modified Rankin (Stroke Patients Only)       Balance Overall balance assessment: Needs assistance Sitting-balance support: Feet supported;No upper extremity supported Sitting balance-Leahy Scale: Good Sitting balance - Comments: static                                    Cognition Arousal/Alertness: Awake/alert Behavior During Therapy: WFL for tasks assessed/performed Overall Cognitive Status: Within Functional Limits for tasks assessed                                        Exercises General Exercises - Lower Extremity Ankle Circles/Pumps: AROM;Both;10 reps    General Comments        Pertinent Vitals/Pain Pain Assessment: Faces Faces Pain Scale: Hurts little more Pain Location: R knee Pain Descriptors / Indicators: Grimacing;Sore;Guarding Pain Intervention(s): Limited activity within patient's tolerance;Repositioned;Monitored during session    Home Living  Prior Function            PT Goals (current goals can now be found in the care plan section) Acute Rehab PT Goals Patient Stated Goal: home PT Goal Formulation: With patient Time For Goal Achievement: 11/14/17 Potential to Achieve Goals: Fair Progress towards PT goals: Progressing toward goals    Frequency    Min 3X/week      PT Plan Current plan remains appropriate    Co-evaluation              AM-PAC PT "6 Clicks" Daily Activity  Outcome Measure  Difficulty turning over in bed (including adjusting bedclothes, sheets and blankets)?: A Lot Difficulty moving from lying on back  to sitting on the side of the bed? : A Lot Difficulty sitting down on and standing up from a chair with arms (e.g., wheelchair, bedside commode, etc,.)?: Unable Help needed moving to and from a bed to chair (including a wheelchair)?: A Lot Help needed walking in hospital room?: Total Help needed climbing 3-5 steps with a railing? : Total 6 Click Score: 9    End of Session Equipment Utilized During Treatment: Gait belt Activity Tolerance: Patient tolerated treatment well Patient left: in chair;with call bell/phone within reach;with chair alarm set Nurse Communication: Mobility status PT Visit Diagnosis: Other abnormalities of gait and mobility (R26.89);Pain Pain - Right/Left: Right Pain - part of body: Knee     Time: 0913-0930 PT Time Calculation (min) (ACUTE ONLY): 17 min  Charges:  $Therapeutic Activity: 8-22 mins                     Lorrin Goodell, PT  Office # [phone] Pager [phone]    Lorriane Shire 11/01/2017, 10:04 AM

## 2017-11-01 NOTE — Op Note (Signed)
    Patient name: Courtney Grant MRN: [id number] DOB: May 20, 1932 Sex: female  11/01/2017 Pre-operative Diagnosis: Critical right lower extremity ischemia Post-operative diagnosis:  Same Surgeon:  Erlene Quan C. Donzetta Matters, MD Procedure Performed: 1.  Ultrasound-guided cannulation left common femoral artery 2.  Aortogram with bilateral lower extremity runoff and angled views of the pelvis  Indications: 84 year old female has a history of a right lower extremity bypass that is occluded.  She has moderately depressed ABI on the right with toe pressure of 18 and is undergone surgery for a septic joint on the right.  She is now indicated for angiogram possible intervention right lower extremity.  Findings: Her aorta and iliac segments are heavily calcified although there is no flow-limiting stenosis.  On the right side she has flush occlusion of her SFA reconstitutes and above-knee popliteal where she has runoff via posterior tibial is the dominant vessel.  On the left side there are no occlusions however she does have at least a couple 50% stenosis in the left SFA.  No intervention was undertaken.   Procedure:  The patient was identified in the holding area and taken to room 8.  The patient was then placed supine on the table and prepped and draped in the usual sterile fashion.  A time out was called.  Ultrasound was used to evaluate the left common femoral artery this was noted to be patent although quite diminutive the area was anesthetized with 1% lidocaine and cannulated with micropuncture needle under direct guidance.  An image was saved the permanent record.  Micropuncture sheath was placed followed by Bentson wire and a 5 French sheath.  Omni Flush was placed to level the aorta and aortogram obtained with the above findings.  It was then withdrawn to the level of the bifurcation and bilateral lower extremity runoff was performed.  We performed a RAO view looking at the pelvis and common femoral arteries which  did not demonstrate any takeoff of the SFA.  I elected to not stick the patient retrograde given the pain that she has in her right lower extremity I do not think she would tolerate this.  Her only option will be femoral to popliteal artery bypass grafting.  I discussed this with her and she understands.  She tolerated this procedure well without immediate comp occasion.  Next  Contrast 115 cc.   Brandon C. Donzetta Matters, MD [facility] [place] Office: [phone] Pager: [phone]

## 2017-11-01 NOTE — Progress Notes (Signed)
Patient's 5 fr left femoral arterial sheath was pulled per order. Manual pressure was held for 20 minutes. Patient tolerated pull well. Sheath site during and post sheath pull was clean, dry, intact and had no sign of a hematoma. Patients VS remained WNL's during and post sheath pull. Patients bedrest began at 1300 and ends in 4 fours at 1700. Vascular site assessment was a level 0. Sheath removal site was dressed with a gauze dressing and was clean, dry, and intact. Patient was educated about post sheath pull management and stated he understood and had no questions.

## 2017-11-01 NOTE — Plan of Care (Signed)
  Problem: Education: Goal: Knowledge of General Education information will improve Description Including pain rating scale, medication(s)/side effects and non-pharmacologic comfort measures Outcome: Progressing   Problem: Clinical Measurements: Goal: Ability to maintain clinical measurements within normal limits will improve Outcome: Progressing   Problem: Clinical Measurements: Goal: Will remain free from infection Outcome: Progressing   Problem: Activity: Goal: Risk for activity intolerance will decrease Outcome: Progressing   Problem: Pain Managment: Goal: General experience of comfort will improve Outcome: Progressing   Problem: Safety: Goal: Ability to remain free from injury will improve Outcome: Progressing   Problem: Skin Integrity: Goal: Risk for impaired skin integrity will decrease Outcome: Progressing

## 2017-11-01 NOTE — Progress Notes (Signed)
Patient Demographics:    Courtney Grant, is a 84 y.o. female, DOB - [birth year], MCN:[id number]  Admit date - 10/30/2017   Admitting Physician Vianne Bulls, MD  Outpatient Primary MD for the patient is Garwin Brothers, MD  LOS - 2  CC--- Rt Knee pain      Subjective:    Courtney Grant today has no fevers, no emesis,  No chest pain,   Daughter in room, , resting in recovering area after cath  Assessment  & Plan :    Principal Problem:   Occlusion of right femoral-popliteal bypass graft [facility]) Active Problems:   COPD (chronic obstructive pulmonary disease) ([place])   Depression   Hypertension   History of pulmonary embolism   MSSA bacteremia   Chronic back pain   Normocytic anemia   Septic arthritis of knee, right (HCC)   Occlusion of right femoropopliteal bypass graft ([place])   History of bleeding peptic ulcer   Abnormal transaminases   11/01/17--- Angiogram Findings: Her aorta and iliac segments are heavily calcified although there is no flow-limiting stenosis.  On the right side she has flush occlusion of her SFA reconstitutes and above-knee popliteal where she has runoff via posterior tibial is the dominant vessel.  On the left side there are no occlusions however she does have at least a couple 50% stenosis in the left SFA.  No intervention was undertaken.  Brief Summary:- 84 y.o. female with pmhx of COPD, hypertension, history of PE with IVC filter not anticoagulated due to history of bleeding ulcer, peripheral arterial disease with history of right femoral-popliteal bypass graft, and depression, who presented to her orthopedic surgeon's office for evaluation of right knee pain, CT scan revealed a distal right thigh abscess that was suspected to be an implant infection, she had aspirate that was purulent, and she was then admitted to [facility] on 10/28/2017. Transferred to [facility] on 10/30/17 due to  concerns about right fem-pop bypass graft occlusion  with Dr. Carlis Abbott from vascular surgery consulting. Initially treated with Vanco/Zosyn, however joint fluid culture grew MSSA, patient was changed to IV Ancef on 10/30/2017.  ABI on 2017-11-18 shows moderate to severe reduction in the arterial flow with abnormal TBI's    Plan:- 1)MSSA Septic Arthritis of the Right Knee--- please see above, s/p I and D/washout 10/29/17,  Per ortho, keep non-weight bearing on RLE for 4 weeks continue IV Ancef that was started on 10/30/2017, we need to clarify with infectious disease with regards to the length of IV antibiotic therapy  2)PAD-- Right Fem-Pop bypass graft occlusion  on imaging studies, ???? Chronicity, ABI on 18-Nov-2017 shows moderate to severe reduction in the arterial flow with abnormal TBI's, Vascular surgery consult from Dr. Carlis Abbott appreciated, angiogram report as noted above with occlusion of right SFA (Rt femoropopliteal bypass graft appears to have failed),  continue  Lipitor and   Plavix .  Discussed with Dr. Carlis Abbott states that he would like patient to get antibiotic treatment for #1 above for the next week or 2 he will follow-up with patient as outpatient to determine when and if further vascular interventions need to be done  3)HTN--- stable, continue metoprolol tartrate to 50 mg twice daily,   may use IV Hydralazine 10 mg  Every 4 hours Prn for systolic blood pressure over 160 mmhg  4)Depression--- stable, continue Zoloft 200 mg daily and buspirone 7.5 mg twice daily, trazodone and Elavil for sleep  5) elevated LFTs--- acute hepatitis profile was negative at outside facility, need to closely and consider stopping Lipitor, right upper quadrant ultrasound pending  6)H/o PE--- no anticoagulation due to history of bleeding from peptic ulcers, IVC in situ  7) acute on chronic anemia--recent hemoglobin apparently usually 10, monitor closely and transfuse as clinically indicated, suspect acute blood loss due  to orthopedic procedures superimposed on anemia of chronic disease.  Given history of prior GI bleed if H&H continues to drop consider further GI work-up, c/n  PPI  Code Status : Full  Disposition Plan  :  ??? SNF for rehab and  for IV antibiotics  Consults  :  Vascular surgery   DVT Prophylaxis  :  - Heparin -   Lab Results  Component Value Date   PLT 380 11/01/2017    Inpatient Medications  Scheduled Meds: . [MAR Hold] amitriptyline  25 mg Oral QHS  . [MAR Hold] atorvastatin  20 mg Oral q1800  . [MAR Hold] busPIRone  7.5 mg Oral BID  . [MAR Hold] clopidogrel  75 mg Oral Daily  . [MAR Hold] dicyclomine  20 mg Oral TID AC  . [MAR Hold] gabapentin  100 mg Oral BID  . [MAR Hold] heparin  5,000 Units Subcutaneous Q8H  . metoprolol tartrate  50 mg Oral BID  . [MAR Hold] pantoprazole  40 mg Oral Daily  . [MAR Hold] sertraline  100 mg Oral Daily  . sodium chloride flush  3 mL Intravenous Q12H  . [MAR Hold] sucralfate  1 g Oral TID WC & HS  . [MAR Hold] traZODone  50 mg Oral QHS  . [MAR Hold] vitamin B-12  1,000 mcg Oral Daily   Continuous Infusions: . sodium chloride 100 mL/hr at 11/01/17 0840  . sodium chloride    . sodium chloride 1 mL/kg/hr (11/01/17 1238)  .  ceFAZolin (ANCEF) IV     PRN Meds:.sodium chloride, [MAR Hold] acetaminophen **OR** [MAR Hold] acetaminophen, acetaminophen, [MAR Hold] albuterol, [MAR Hold] hydrALAZINE, hydrALAZINE, labetalol, [MAR Hold]  morphine injection, [MAR Hold] ondansetron **OR** [MAR Hold] ondansetron (ZOFRAN) IV, ondansetron (ZOFRAN) IV, [MAR Hold] oxyCODONE-acetaminophen, [MAR Hold] senna-docusate, sodium chloride flush    Anti-infectives (From admission, onward)   Start     Dose/Rate Route Frequency Ordered Stop   11/01/17 1730  ceFAZolin (ANCEF) IVPB 2g/100 mL premix     2 g 200 mL/hr over 30 Minutes Intravenous Every 8 hours 11/01/17 1724     10/30/17 2130  ceFAZolin (ANCEF) IVPB 2g/100 mL premix  Status:  Discontinued     2 g 200  mL/hr over 30 Minutes Intravenous Every 8 hours 10/30/17 2116 11/01/17 1724       Objective:   Vitals:   11/01/17 1440 11/01/17 1455 11/01/17 1515 11/01/17 1525  BP: (!) 146/55 (!) 162/77 118/87 (!) 114/50  Pulse: 70 84 83 79  Resp: 19 17 17  (!) 25  Temp:      TempSrc:      SpO2: 99% 95% 100% 95%  Weight:      Height:        Wt Readings from Last 3 Encounters:  10/31/17 70.4 kg  10/25/15 52.8 kg  12/09/10 56.2 kg     Intake/Output Summary (Last 24 hours) at 11/01/2017 1726 Last data filed at 11/01/2017 0900 Gross per  24 hour  Intake 0 ml  Output -  Net 0 ml     Physical Exam  Gen:- Awake Alert,  In no apparent distress  HEENT:- [place].AT, No sclera icterus Neck-Supple Neck,No JVD,.  Lungs-  CTAB , good air movement CV- S1, S2 normal, regular  Abd-  +ve B.Sounds, Abd Soft, No tenderness,    Extremity/Skin:-Varus deformity of the left knee, right lower extremity slightly swollen, Rt knee area with surgical incisions/sutures that are clean dry and intact, Lt Groin angiogram site is hemostatic Psych-affect is appropriate, oriented x3 Neuro-no new focal deficits (patient has mild dysarthria which is not new), no tremors   Data Review:   Micro Results No results found for this or any previous visit (from the past 240 hour(s)).  Radiology Reports US Abdomen Limited Ruq  Result Date: 10/30/2017 CLINICAL DATA:  Abnormal liver function studies. Previous cholecystectomy. EXAM: ULTRASOUND ABDOMEN LIMITED RIGHT UPPER QUADRANT COMPARISON:  Intraoperative cholangiogram 08/16/2015 FINDINGS: Gallbladder: Gallbladder is surgically absent. No fluid or mass demonstrated in the gallbladder fossa. Common bile duct: Diameter: 6.8 mm, normal Liver: Somewhat limited visualization due to rib shadowing. Liver parenchymal echotexture appears to be normal and homogeneous. No focal lesions are identified. Portal vein is patent on color Doppler imaging with normal direction of blood flow towards the  liver. IMPRESSION: 1. Surgical absence of the gallbladder.  No bile duct dilatation. 2. Homogeneous appearance of the liver. No focal lesions identified. Electronically Signed   By: Lucienne Capers M.D.   On: 10/30/2017 23:46     CBC Recent Labs  Lab 10/30/17 2000 11/01/17 0407  WBC 13.2* 13.1*  HGB 8.0* 8.3*  HCT 25.5* 27.3*  PLT 319 380  MCV 89.8 91.0  MCH 28.2 27.7  MCHC 31.4 30.4  RDW 15.4 15.6*  LYMPHSABS 1.5  --   MONOABS 1.1*  --   EOSABS 0.1  --   BASOSABS 0.0  --     Chemistries  Recent Labs  Lab 10/30/17 2000 11/01/17 0407  NA 134* 139  K 3.3* 3.6  CL 100 106  CO2 25 25  GLUCOSE 123* 128*  BUN 6* 9  CREATININE 0.66 0.63  CALCIUM 7.6* 7.7*  AST 103* 65*  ALT 90* 62*  ALKPHOS 94 85  BILITOT 0.4 0.4   ------------------------------------------------------------------------------------------------------------------ No results for input(s): CHOL, HDL, LDLCALC, TRIG, CHOLHDL, LDLDIRECT in the last 72 hours.  No results found for: HGBA1C ------------------------------------------------------------------------------------------------------------------ No results for input(s): TSH, T4TOTAL, T3FREE, THYROIDAB in the last 72 hours.  Invalid input(s): FREET3 ------------------------------------------------------------------------------------------------------------------ No results for input(s): VITAMINB12, FOLATE, FERRITIN, TIBC, IRON, RETICCTPCT in the last 72 hours.  Coagulation profile No results for input(s): INR, PROTIME in the last 168 hours.  No results for input(s): DDIMER in the last 72 hours.  Cardiac Enzymes No results for input(s): CKMB, TROPONINI, MYOGLOBIN in the last 168 hours.  Invalid input(s): CK ------------------------------------------------------------------------------------------------------------------ No results found for: BNP   Roxan Hockey M.D on 11/01/2017 at 5:26 PM   Go to [email or web address] - password [email or web address] for contact  info  [facility] - Office  [phone]

## 2017-11-01 NOTE — Progress Notes (Addendum)
   Patient seen and evaluated with previous right femoral based bypass graft.  This was reportedly a distal bypass but I do not see incisions to support that medially although there is a healed scar laterally that is very distal would likely be a peroneal bypass.  She does have a small ulcer on her second toe and has had a septic knee joint on the right with severely depressed toe pressures on the right and an ABI of 0.5.  We will plan for angiogram with possible intervention on the right lower extremity today.  Raijon Lindfors C. Donzetta Matters, MD [facility] [place] Office: [phone] Pager: [phone]

## 2017-11-02 ENCOUNTER — Inpatient Hospital Stay (HOSPITAL_COMMUNITY): Payer: Medicare Other

## 2017-11-02 ENCOUNTER — Telehealth: Payer: Self-pay | Admitting: Vascular Surgery

## 2017-11-02 ENCOUNTER — Encounter (HOSPITAL_COMMUNITY): Payer: Self-pay | Admitting: Vascular Surgery

## 2017-11-02 DIAGNOSIS — Z01810 Encounter for preprocedural cardiovascular examination: Secondary | ICD-10-CM

## 2017-11-02 LAB — CBC
HCT: 26.3 % — ABNORMAL LOW (ref 36.0–46.0)
Hemoglobin: 8 g/dL — ABNORMAL LOW (ref 12.0–15.0)
MCH: 27.7 pg (ref 26.0–34.0)
MCHC: 30.4 g/dL (ref 30.0–36.0)
MCV: 91 fL (ref 78.0–100.0)
Platelets: 371 10*3/uL (ref 150–400)
RBC: 2.89 MIL/uL — ABNORMAL LOW (ref 3.87–5.11)
RDW: 15.5 % (ref 11.5–15.5)
WBC: 14 10*3/uL — ABNORMAL HIGH (ref 4.0–10.5)

## 2017-11-02 LAB — BASIC METABOLIC PANEL
Anion gap: 12 (ref 5–15)
BUN: <5 mg/dL — ABNORMAL LOW (ref 8–23)
CO2: 23 mmol/L (ref 22–32)
Calcium: 7.5 mg/dL — ABNORMAL LOW (ref 8.9–10.3)
Chloride: 103 mmol/L (ref 98–111)
Creatinine, Ser: 0.52 mg/dL (ref 0.44–1.00)
GFR calc Af Amer: >60 mL/min (ref >60)
GFR calc non Af Amer: >60 mL/min (ref >60)
Glucose, Bld: 102 mg/dL — ABNORMAL HIGH (ref 70–99)
Potassium: 3.7 mmol/L (ref 3.5–5.1)
Sodium: 138 mmol/L (ref 135–145)

## 2017-11-02 MED ORDER — MORPHINE SULFATE (PF) 4 MG/ML IV SOLN
4.0000 mg | INTRAVENOUS | Status: DC | PRN
  Administered 2017-11-02 – 2017-11-03 (×4): 4 mg via INTRAVENOUS
  Filled 2017-11-02 (×4): qty 1

## 2017-11-02 MED ORDER — OXYCODONE-ACETAMINOPHEN 7.5-325 MG PO TABS
1.0000 | ORAL_TABLET | ORAL | Status: DC | PRN
  Administered 2017-11-02 – 2017-11-03 (×3): 1 via ORAL
  Filled 2017-11-02 (×3): qty 1

## 2017-11-02 NOTE — Progress Notes (Signed)
Physical Therapy Treatment Patient Details Name: Courtney Grant MRN: [id number] DOB: 12/14/1932 Today's Date: 11/02/2017    History of Present Illness Pt adm to [facility] on 8/15 with rt septic knee, rt thigh abscess, and likely infected hardware. Pt also with occluded rt fem-pop bypass graft. On 10/29/17 at [facility] pt underwent I&D of rt knee/thigh and removal of plate and screws from previous ORIF. Pt transferred to [facility] on 8/17 for vascular consult concerning occluded bypass graft. PMH - rt femur fx x 2,  COPD, hypertension, history of PE with IVC filter not anticoagulated due to history of bleeding ulcer, peripheral arterial disease with history of right femoral-pop bypass, depression.    PT Comments    Pt with slow progress with mobility. Significant pain in RLE as expected. Worked on scooting transfer since standing and maintaining NWB on RLE will be very difficult. Continue to recommend ST-SNF as pt will be home alone during the day.   Follow Up Recommendations  SNF     Equipment Recommendations  Wheelchair (measurements PT);Wheelchair cushion (measurements PT)    Recommendations for Other Services       Precautions / Restrictions Precautions Precautions: Fall Restrictions Weight Bearing Restrictions: Yes RLE Weight Bearing: Non weight bearing Other Position/Activity Restrictions: x 4 weeks per ortho-Dr. Talmadge Coventry progress note 10-31-17    Mobility  Bed Mobility Overal bed mobility: Needs Assistance Bed Mobility: Supine to Sit     Supine to sit: HOB elevated;Min assist     General bed mobility comments: Assist to bring RLE off of bed and bring hips to EOB  Transfers Overall transfer level: Needs assistance Equipment used: None Transfers: Lateral/Scoot Transfers          Lateral/Scoot Transfers: Min assist General transfer comment: Dropped arm of recliner and pt scooted from bed to recliner toward rt.   Ambulation/Gait             General Gait  Details: unable due to pain/NWB status   Stairs             Wheelchair Mobility    Modified Rankin (Stroke Patients Only)       Balance Overall balance assessment: Needs assistance Sitting-balance support: Feet supported;No upper extremity supported Sitting balance-Leahy Scale: Good Sitting balance - Comments: static                                    Cognition Arousal/Alertness: Awake/alert Behavior During Therapy: WFL for tasks assessed/performed Overall Cognitive Status: Within Functional Limits for tasks assessed                                        Exercises      General Comments        Pertinent Vitals/Pain Pain Assessment: Faces Faces Pain Scale: Hurts whole lot Pain Location: R knee Pain Descriptors / Indicators: Grimacing;Guarding Pain Intervention(s): Limited activity within patient's tolerance;Monitored during session;Repositioned;Patient requesting pain meds-RN notified    Home Living                      Prior Function            PT Goals (current goals can now be found in the care plan section) Progress towards PT goals: Progressing toward goals    Frequency    Min 3X/week  PT Plan Current plan remains appropriate    Co-evaluation              AM-PAC PT "6 Clicks" Daily Activity  Outcome Measure  Difficulty turning over in bed (including adjusting bedclothes, sheets and blankets)?: A Lot Difficulty moving from lying on back to sitting on the side of the bed? : Unable Difficulty sitting down on and standing up from a chair with arms (e.g., wheelchair, bedside commode, etc,.)?: Unable Help needed moving to and from a bed to chair (including a wheelchair)?: A Lot Help needed walking in hospital room?: Total Help needed climbing 3-5 steps with a railing? : Total 6 Click Score: 8    End of Session   Activity Tolerance: Patient limited by pain Patient left: in chair;with call  bell/phone within reach;with chair alarm set Nurse Communication: Mobility status PT Visit Diagnosis: Other abnormalities of gait and mobility (R26.89);Pain Pain - Right/Left: Right Pain - part of body: Knee     Time: 7824-2353 PT Time Calculation (min) (ACUTE ONLY): 28 min  Charges:  $Therapeutic Activity: 23-37 mins                     [facility] PT [place] 11/02/2017, 2:27 PM

## 2017-11-02 NOTE — Care Management Note (Signed)
Case Management Note  Patient Details  Name: Courtney Grant MRN: [id number] Date of Birth: Apr 04, 1932  Subjective/Objective:   Right Septic Knee, right thigh abscess, Vascular Surgery                 Action/Plan: NCM spoke to pt and she lives at home with dtr, [name], attempted call to dtr but she was not available. PT recommended SNF for rehab and IV abx. CSW referral for SNF. Will continue to follow for dc needs.  Expected Discharge Date:                  Expected Discharge Plan:  Skilled Nursing Facility  In-House Referral:  Clinical Social Work  Discharge planning Services  CM Consult  Post Acute Care Choice:  NA Choice offered to:  NA  DME Arranged:  N/A DME Agency:  NA  HH Arranged:    [place] Agency:     Status of Service:  In process, will continue to follow  If discussed at Long Length of Stay Meetings, dates discussed:    Additional Comments:  Erenest Rasher, RN 11/02/2017, 5:44 PM

## 2017-11-02 NOTE — Telephone Encounter (Signed)
LMOM advising patient she has an up coming appt on 11/09/17 Tuesday with Dr. Carlis Abbott at 3:45 and to contact us at [phone] for any questions.

## 2017-11-02 NOTE — Progress Notes (Signed)
Pharmacy Antibiotic Note  Courtney Grant is a 84 y.o. female admitted on 10/30/2017 with MSSA bacteremia.  Pharmacy has been consulted for cefazolin dosing.  Transferred from [facility] after open R distal thigh abcess I&D, R distal femur deep implant removal and R knee arthroscopic I&D on 8/16. BCx on 8/15 grew staph aureus (MRSA - on BCID) per records from [place].  CT scan showing distal R thigh abscess and concern for R fem-pop bypass graft occlusion.   Renal function stable, afebrile, WBC 14.     Plan: Ancef 2gm IV Q8H Monitor renal fxn, clinical progress, abx LOT   Height: 5\' 3"  (160 cm) Weight: 156 lb 8.4 oz (71 kg) IBW/kg (Calculated) : 52.4  Temp (24hrs), Avg:98.6 F (37 C), Min:98.2 F (36.8 C), Max:99.1 F (37.3 C)  Recent Labs  Lab 10/30/17 2000 11/01/17 0407 11/02/17 0314  WBC 13.2* 13.1* 14.0*  CREATININE 0.66 0.63 0.52    Estimated Creatinine Clearance: 50.3 mL/min (by C-G formula based on SCr of 0.52 mg/dL).    Allergies  Allergen Reactions  . Aspirin Nausea And Vomiting  . Codeine Nausea And Vomiting  . Ibuprofen Nausea And Vomiting  . Penicillins Rash    Has patient had a PCN reaction causing immediate rash, facial/tongue/throat swelling, SOB or lightheadedness with hypotension: YES Has patient had a PCN reaction causing severe rash involving mucus membranes or skin necrosis: NO Has patient had a PCN reaction that required hospitalization: NO Has patient had a PCN reaction occurring within the last 10 years: YES If all of the above answers are "NO", then may proceed with Cephalosporin use.     Ancef 8/17 >> Vanc/CTX at OSH  8/15 BCx: staph aureus (BCID MSSA) at [place] D. Mina Marble, PharmD, BCPS, [place] 11/02/2017, 10:21 AM

## 2017-11-02 NOTE — Progress Notes (Signed)
Patient Demographics:    Courtney Grant, is a 84 y.o. female, DOB - 11-13-1932, GDJ:[id number]  Admit date - 10/30/2017   Admitting Physician Vianne Bulls, MD  Outpatient Primary MD for the patient is Garwin Brothers, MD  LOS - 3  CC--- Rt Knee pain      Subjective:    Courtney Grant today has no fevers, no emesis,  No chest pain,   complains of bilateral leg discomfort, eating and drinking okay  Assessment  & Plan :    Principal Problem:   Occlusion of right femoral-popliteal bypass graft [facility]) Active Problems:   COPD (chronic obstructive pulmonary disease) ([place])   Depression   Hypertension   History of pulmonary embolism   MSSA bacteremia   Chronic back pain   Normocytic anemia   Septic arthritis of knee, right (HCC)   Occlusion of right femoropopliteal bypass graft ([place])   History of bleeding peptic ulcer   Abnormal transaminases   11/01/17--- Angiogram Findings: Her aorta and iliac segments are heavily calcified although there is no flow-limiting stenosis.  On the right side she has flush occlusion of her SFA reconstitutes and above-knee popliteal where she has runoff via posterior tibial is the dominant vessel.  On the left side there are no occlusions however she does have at least a couple 50% stenosis in the left SFA.  No intervention was undertaken.  Brief Summary:- 84 y.o. female with pmhx of COPD, hypertension, history of PE with IVC filter not anticoagulated due to history of bleeding ulcer, peripheral arterial disease with history of right femoral-popliteal bypass graft, and depression, who presented to her orthopedic surgeon's office (Dr Samule Dry)  for evaluation of right knee pain, CT scan revealed a distal right thigh abscess that was suspected to be an implant infection, she had aspirate that was purulent, and she was then admitted to [facility] on 10/28/2017. Transferred to [facility]  on 10/30/17 due to concerns about right fem-pop bypass graft occlusion  with Dr. Carlis Abbott from vascular surgery consulting. Initially treated with Vanco/Zosyn, however joint fluid culture grew MSSA, patient was changed to IV Ancef on 10/30/2017.  ABI on 11/24/2017 shows moderate to severe reduction in the arterial flow with abnormal TBI's, angiogram from 11/01/2017 as above    Plan:- 1)MSSA Septic Arthritis of the Right Knee--- please see above, s/p I and D/washout 10/29/17,  Per ortho (Dr Samule Dry)  keep non-weight bearing on RLE for 4 weeks , continue IV Ancef for 4 to 6 weeks (started on 10/30/2017) , please call/phone consult withwith infectious disease to discuss results stop date of iv ancef just prior to discharge to skilled nursing facility.  Please also call and notify patient's orthopedic surgeon Dr. Samule Dry when patient is been discharged from skilled nursing facility so follow-up appointment can be made with him.  On 10/30/2017 ESR was over 140 and CRP was over 27, check ESR and CRP in 7 to 10 days to monitor response to treatment  2)PAD-- Right Fem-Pop bypass graft occlusion  on imaging studies, ???? Chronicity, ABI on 11-24-17 shows moderate to severe reduction in the arterial flow with abnormal TBI's, Vascular surgery consult from Dr. Carlis Abbott appreciated, angiogram report as noted above with occlusion of right SFA (Rt femoropopliteal bypass graft  appears to have failed),  continue  Lipitor and   Plavix .  Discussed with Dr. Carlis Abbott states that he would like patient to get antibiotic treatment for #1 above for the next couple of weeks and  he will follow-up with patient as outpatient in 2 to 3 weeks to determine when and if further vascular interventions need to be done  3)HTN--- stable, continue metoprolol tartrate  50 mg twice daily,   may use IV Hydralazine 10 mg  Every 4 hours Prn for systolic blood pressure over 160 mmhg  4)Depression--- stable, continue Zoloft 200 mg daily and buspirone 7.5 mg twice  daily, trazodone and Elavil for sleep  5)Elevated LFTs--- acute hepatitis profile was negative at outside facility, need to closely and consider stopping Lipitor, right upper quadrant ultrasound pending  6)H/o PE--- no anticoagulation due to history of bleeding from peptic ulcers, IVC in situ  7)Acute on chronic Anemia--recent hemoglobin apparently usually 10, monitor closely and transfuse as clinically indicated, suspect acute blood loss due to orthopedic procedures and hemodilution from IV fluids superimposed on anemia of chronic disease.  Given history of prior GI bleed if H&H continues to drop consider further GI work-up, c/n  PPI.  Hemoglobin is currently 8.0, check stool occult blood  Code Status : Full  Disposition Plan  : Awaiting insurance approval for transfer SNF for rehab and  for IV antibiotics  Consults  :  Vascular surgery  DVT Prophylaxis  :  - Heparin -   Lab Results  Component Value Date   PLT 371 11/02/2017    Inpatient Medications  Scheduled Meds: . amitriptyline  25 mg Oral QHS  . atorvastatin  20 mg Oral q1800  . busPIRone  7.5 mg Oral BID  . clopidogrel  75 mg Oral Daily  . dicyclomine  20 mg Oral TID AC  . gabapentin  100 mg Oral BID  . heparin  5,000 Units Subcutaneous Q8H  . metoprolol tartrate  50 mg Oral BID  . pantoprazole  40 mg Oral Daily  . sertraline  100 mg Oral Daily  . sodium chloride flush  3 mL Intravenous Q12H  . sucralfate  1 g Oral TID WC & HS  . traZODone  50 mg Oral QHS  . vitamin B-12  1,000 mcg Oral Daily   Continuous Infusions: . sodium chloride    .  ceFAZolin (ANCEF) IV 2 g (11/02/17 1453)   PRN Meds:.sodium chloride, acetaminophen **OR** acetaminophen, acetaminophen, albuterol, hydrALAZINE, hydrALAZINE, labetalol, morphine injection, ondansetron **OR** ondansetron (ZOFRAN) IV, ondansetron (ZOFRAN) IV, oxyCODONE-acetaminophen, senna-docusate, sodium chloride flush    Anti-infectives (From admission, onward)   Start      Dose/Rate Route Frequency Ordered Stop   11/01/17 1730  ceFAZolin (ANCEF) IVPB 2g/100 mL premix     2 g 200 mL/hr over 30 Minutes Intravenous Every 8 hours 11/01/17 1724     10/30/17 2130  ceFAZolin (ANCEF) IVPB 2g/100 mL premix  Status:  Discontinued     2 g 200 mL/hr over 30 Minutes Intravenous Every 8 hours 10/30/17 2116 11/01/17 1724       Objective:   Vitals:   11/02/17 0326 11/02/17 0724 11/02/17 1245 11/02/17 1542  BP: (!) 152/42 (!) 146/62 (!) 143/68 (!) 143/48  Pulse: 84 88 86 73  Resp: (!) 22 20 17  (!) 22  Temp: 98.6 F (37 C) 99.1 F (37.3 C) 97.7 F (36.5 C) 98.5 F (36.9 C)  TempSrc: Oral Oral Oral Oral  SpO2: 98% 100% 96% 100%  Weight: 71 kg     Height:        Wt Readings from Last 3 Encounters:  11/02/17 71 kg  10/25/15 52.8 kg  12/09/10 56.2 kg     Intake/Output Summary (Last 24 hours) at 11/02/2017 1748 Last data filed at 11/02/2017 1300 Gross per 24 hour  Intake 1259.41 ml  Output 500 ml  Net 759.41 ml     Physical Exam  Gen:- Awake Alert,  In no apparent distress  HEENT:- [place].AT, No sclera icterus Neck-Supple Neck,No JVD,.  Lungs-  CTAB , good air movement CV- S1, S2 normal, regular  Abd-  +ve B.Sounds, Abd Soft, No tenderness,    Extremity/Skin:-Varus deformity of the left knee, right lower extremity slightly swollen, Rt knee area with surgical incisions/sutures that are clean dry and intact, Lt Groin angiogram site is hemostatic Psych-affect is appropriate, oriented x3 Neuro-no new focal deficits (patient has mild dysarthria which is not new), no tremors   Data Review:   Micro Results No results found for this or any previous visit (from the past 240 hour(s)).  Radiology Reports US Abdomen Limited Ruq  Result Date: 10/30/2017 CLINICAL DATA:  Abnormal liver function studies. Previous cholecystectomy. EXAM: ULTRASOUND ABDOMEN LIMITED RIGHT UPPER QUADRANT COMPARISON:  Intraoperative cholangiogram 08/16/2015 FINDINGS: Gallbladder:  Gallbladder is surgically absent. No fluid or mass demonstrated in the gallbladder fossa. Common bile duct: Diameter: 6.8 mm, normal Liver: Somewhat limited visualization due to rib shadowing. Liver parenchymal echotexture appears to be normal and homogeneous. No focal lesions are identified. Portal vein is patent on color Doppler imaging with normal direction of blood flow towards the liver. IMPRESSION: 1. Surgical absence of the gallbladder.  No bile duct dilatation. 2. Homogeneous appearance of the liver. No focal lesions identified. Electronically Signed   By: Lucienne Capers M.D.   On: 10/30/2017 23:46     CBC Recent Labs  Lab 10/30/17 2000 11/01/17 0407 11/02/17 0314  WBC 13.2* 13.1* 14.0*  HGB 8.0* 8.3* 8.0*  HCT 25.5* 27.3* 26.3*  PLT 319 380 371  MCV 89.8 91.0 91.0  MCH 28.2 27.7 27.7  MCHC 31.4 30.4 30.4  RDW 15.4 15.6* 15.5  LYMPHSABS 1.5  --   --   MONOABS 1.1*  --   --   EOSABS 0.1  --   --   BASOSABS 0.0  --   --     Chemistries  Recent Labs  Lab 10/30/17 2000 11/01/17 0407 11/02/17 0314  NA 134* 139 138  K 3.3* 3.6 3.7  CL 100 106 103  CO2 25 25 23   GLUCOSE 123* 128* 102*  BUN 6* 9 <5*  CREATININE 0.66 0.63 0.52  CALCIUM 7.6* 7.7* 7.5*  AST 103* 65*  --   ALT 90* 62*  --   ALKPHOS 94 85  --   BILITOT 0.4 0.4  --    ------------------------------------------------------------------------------------------------------------------ No results for input(s): CHOL, HDL, LDLCALC, TRIG, CHOLHDL, LDLDIRECT in the last 72 hours.  No results found for: HGBA1C ------------------------------------------------------------------------------------------------------------------ No results for input(s): TSH, T4TOTAL, T3FREE, THYROIDAB in the last 72 hours.  Invalid input(s): FREET3 ------------------------------------------------------------------------------------------------------------------ No results for input(s): VITAMINB12, FOLATE, FERRITIN, TIBC, IRON,  RETICCTPCT in the last 72 hours.  Coagulation profile No results for input(s): INR, PROTIME in the last 168 hours.  No results for input(s): DDIMER in the last 72 hours.  Cardiac Enzymes No results for input(s): CKMB, TROPONINI, MYOGLOBIN in the last 168 hours.  Invalid input(s): CK ------------------------------------------------------------------------------------------------------------------ No results found for: BNP  Roxan Hockey M.D on 11/02/2017 at 5:48 PM   Go to [email or web address] - password [email or web address] for contact info  [facility] - Office  [phone]

## 2017-11-02 NOTE — Progress Notes (Signed)
*  Preliminary Results*  Bilateral lower extremity venous duplex completed.     11/02/2017 12:06 PM Maudry Mayhew, MHA, RVT, RDCS, RDMS

## 2017-11-02 NOTE — Care Management Important Message (Signed)
Important Message  Patient Details  Name: Courtney Grant MRN: [id number] Date of Birth: [birth year]   Medicare Important Message Given:  Yes    Orbie Pyo 11/02/2017, 4:33 PM

## 2017-11-02 NOTE — Progress Notes (Signed)
[facility] [place]  Subjective  -  84 year old female recently admitted to [facility] for right knee septic joint.  She was transferred here for vascular surgery evaluation given an incidental finding on a CT of a right fem distal bypass occlusion.    ABI 0.5, TP 18.    Objective (!) 146/62 88 99.1 F (37.3 C) 20 100%  Intake/Output Summary (Last 24 hours) at 11/02/2017 0852 Last data filed at 11/02/2017 0743 Gross per 24 hour  Intake 659.41 ml  Output 500 ml  Net 159.41 ml   PE General: NAD, resting Extremities: Palpable femoral pulses bilateral groins, no hematoma in left groin after access DP/PT signals RLE, PT signal LLE Motor intact, right foot warm  Assessment/Planning:  84 year old female with septic right knee joint found to have incidental occlusion of an old right fem distal bypass on CT.   Patient went for arteriogram with my partner Dr. Donzetta Matters yesterday and indeed confirmed that her femoral to above-knee pop bypass is occluded which we already noted on CT.  Unfortunately she did not have any other endovascular options and it appears she would need a fem below-knee popliteal bypass given her runoff.  She will get vein mapping today to see what conduit options she has available.  I discussed with her daughter in detail last night that I would favor letting her go to rehab for the next several weeks and make sure that her septic knee with hardware removal has completely resolved since we would ultimately need a tunnel a new bypass through this area.  She does not have any severe rest pain and I do think she had a subclinical thrombosis of her bypass.  She only has a small wound between her first and second toe that is largely unimpressive - but I dont want to ignore it either.  I talked to her daughter that my plan would be to see her back in clinic in a couple weeks to monitor this wound and certainly if she has progression we need to be very proactive  about proceeding forward with a new bypass.  She will need very good foot care in her new facility including floating her heels to prevent her from getting a heel ulcer or a new wound which would be problematic for her given her severely reduced toe pressure.   Courtney Grant 11/02/2017 8:52 AM -- Courtney Heck, MD [facility] [place] Office: [phone] Pager: [phone]

## 2017-11-03 DIAGNOSIS — E876 Hypokalemia: Secondary | ICD-10-CM

## 2017-11-03 LAB — COMPREHENSIVE METABOLIC PANEL
ALT: 31 U/L (ref 0–44)
AST: 38 U/L (ref 15–41)
Albumin: 1.6 g/dL — ABNORMAL LOW (ref 3.5–5.0)
Alkaline Phosphatase: 91 U/L (ref 38–126)
Anion gap: 8 (ref 5–15)
BUN: 7 mg/dL — ABNORMAL LOW (ref 8–23)
CO2: 24 mmol/L (ref 22–32)
Calcium: 6.9 mg/dL — ABNORMAL LOW (ref 8.9–10.3)
Chloride: 106 mmol/L (ref 98–111)
Creatinine, Ser: 0.68 mg/dL (ref 0.44–1.00)
GFR calc Af Amer: >60 mL/min (ref >60)
GFR calc non Af Amer: >60 mL/min (ref >60)
Glucose, Bld: 181 mg/dL — ABNORMAL HIGH (ref 70–99)
Potassium: 3 mmol/L — ABNORMAL LOW (ref 3.5–5.1)
Sodium: 138 mmol/L (ref 135–145)
Total Bilirubin: 0.1 mg/dL — ABNORMAL LOW (ref 0.3–1.2)
Total Protein: 5.2 g/dL — ABNORMAL LOW (ref 6.5–8.1)

## 2017-11-03 LAB — CBC
HCT: 23.7 % — ABNORMAL LOW (ref 36.0–46.0)
HCT: 24.1 % — ABNORMAL LOW (ref 36.0–46.0)
Hemoglobin: 7.1 g/dL — ABNORMAL LOW (ref 12.0–15.0)
Hemoglobin: 7.3 g/dL — ABNORMAL LOW (ref 12.0–15.0)
MCH: 27.3 pg (ref 26.0–34.0)
MCH: 27.7 pg (ref 26.0–34.0)
MCHC: 30 g/dL (ref 30.0–36.0)
MCHC: 30.3 g/dL (ref 30.0–36.0)
MCV: 91.2 fL (ref 78.0–100.0)
MCV: 91.3 fL (ref 78.0–100.0)
Platelets: 364 10*3/uL (ref 150–400)
Platelets: 458 10*3/uL — ABNORMAL HIGH (ref 150–400)
RBC: 2.6 MIL/uL — ABNORMAL LOW (ref 3.87–5.11)
RBC: 2.64 MIL/uL — ABNORMAL LOW (ref 3.87–5.11)
RDW: 15.6 % — ABNORMAL HIGH (ref 11.5–15.5)
RDW: 16 % — ABNORMAL HIGH (ref 11.5–15.5)
WBC: 13.1 10*3/uL — ABNORMAL HIGH (ref 4.0–10.5)
WBC: 13 10*3/uL — ABNORMAL HIGH (ref 4.0–10.5)

## 2017-11-03 LAB — MAGNESIUM: Magnesium: 1.6 mg/dL — ABNORMAL LOW (ref 1.7–2.4)

## 2017-11-03 LAB — OCCULT BLOOD X 1 CARD TO LAB, STOOL: Fecal Occult Bld: NEGATIVE

## 2017-11-03 MED ORDER — MAGNESIUM SULFATE 2 GM/50ML IV SOLN
2.0000 g | Freq: Once | INTRAVENOUS | Status: AC
  Administered 2017-11-03: 2 g via INTRAVENOUS
  Filled 2017-11-03 (×2): qty 50

## 2017-11-03 MED ORDER — OXYCODONE-ACETAMINOPHEN 7.5-325 MG PO TABS
1.0000 | ORAL_TABLET | ORAL | Status: DC | PRN
  Administered 2017-11-03 – 2017-11-04 (×4): 1 via ORAL
  Filled 2017-11-03 (×4): qty 1

## 2017-11-03 MED ORDER — POTASSIUM CHLORIDE CRYS ER 20 MEQ PO TBCR
40.0000 meq | EXTENDED_RELEASE_TABLET | ORAL | Status: AC
  Administered 2017-11-03 (×2): 40 meq via ORAL
  Filled 2017-11-03 (×2): qty 2

## 2017-11-03 MED ORDER — OXYCODONE HCL ER 10 MG PO T12A
10.0000 mg | EXTENDED_RELEASE_TABLET | Freq: Three times a day (TID) | ORAL | Status: DC
  Administered 2017-11-03 – 2017-11-05 (×6): 10 mg via ORAL
  Filled 2017-11-03 (×6): qty 1

## 2017-11-03 MED ORDER — MORPHINE SULFATE (PF) 2 MG/ML IV SOLN
2.0000 mg | INTRAVENOUS | Status: DC | PRN
  Administered 2017-11-03 – 2017-11-04 (×6): 2 mg via INTRAVENOUS
  Filled 2017-11-03 (×6): qty 1

## 2017-11-03 NOTE — Progress Notes (Signed)
PROGRESS NOTE   Courtney Grant  YHC:[id number]    DOB: 11/23/1932    DOA: 10/30/2017  PCP: Garwin Brothers, MD   I have briefly reviewed patients previous medical records in [facility].  Brief Narrative:  84 y.o.femalewith pmhx ofCOPD, hypertension, history of PE with IVC filter not anticoagulated due to history of bleeding ulcer, peripheral arterial disease with history of right femoral-popliteal bypass graft, and depression, who presented to her orthopedic surgeon's office (Dr Samule Dry)  for evaluation of right knee pain, CT scan revealed a distal right thigh abscess that was suspected to be an implant infection, she had aspirate that was purulent, and she was then admitted to [facility] on 10/28/2017. Transferred to [facility] on 10/30/17 due to concerns about right fem-pop bypass graft occlusion with Dr. Carlis Abbott from vascular surgery consulting. Initially treated with Vanco/Zosyn, however joint fluid culture grew MSSA, patient was changed to IV Ancef on 10/30/2017.  ABI on 21-Nov-2017 shows moderate to severe reduction in the arterial flow with abnormal TBI's.  Vascular surgery plans no interventions at this time and plans to follow outpatient.   Assessment & Plan:   Principal Problem:   Occlusion of right femoral-popliteal bypass graft (HCC) Active Problems:   COPD (chronic obstructive pulmonary disease) (HCC)   Depression   Hypertension   History of pulmonary embolism   MSSA bacteremia   Chronic back pain   Normocytic anemia   Septic arthritis of knee, right (HCC)   Occlusion of right femoropopliteal bypass graft (HCC)   History of bleeding peptic ulcer   Abnormal transaminases   1)MSSA Septic Arthritis of the Right Knee--- please see above, s/p I and D/washout 10/29/17,  Per ortho (Dr Lorin Mercy)  keep non-weight bearing on RLE for 4 weeks , continue IV Ancef for 4 to 6 weeks (started on 10/30/2017)> I am unable to see any recent inpatient notes by Dr. Lorin Mercy, suspect this was all at  [facility].  I will attempt to call him 8/22 to clarify antibiotics whether this can be done orally and follow-up plans and then discuss with ID on-call. On 10/30/2017 ESR was over 140 and CRP was over 27, check ESR and CRP in 7 to 10 days to monitor response to treatment.  Patient with uncontrolled pain and had been using almost around-the-clock IV morphine in addition to oral Percocet with inadequate pain control.  She reports chronic pain, follows with outpatient pain management (Dr. Humphrey Rolls) and is on MS Contin at home.  Need to control pain better prior to discharge.  Resumed home dose of MS Contin, continue Percocet as needed, reduced IV morphine.  Discussed with patient and RN.  2)PAD-- Right Fem-Pop bypass graft occlusion on imaging studies, ???? Chronicity, ABI on 21-Nov-2017 shows moderate to severe reduction in the arterial flow with abnormal TBI's, Vascular surgery consult from Dr. Carlis Abbott appreciated, angiogram with occlusion of right SFA (Rt femoropopliteal bypass graft appears to have failed),  continue  Lipitor and   Plavix .  Discussed with Dr. Carlis Abbott states that he would like patient to get antibiotic treatment for #1 above for the next couple of weeks and  he will follow-up with patient as outpatient in 2 to 3 weeks to determine when and if further vascular interventions need to be done.  I personally interviewed and examined patient along with Dr. Carlis Abbott and discussed with him who at this time does not plan any inpatient interventions and plans to see her back in the clinic in a few  weeks.  3)HTN--- stable, continue metoprolol tartrate  50 mg twice daily,   may use IV Hydralazine 10 mg  Every 4 hours Prn for systolic blood pressure over 160 mmhg.  Controlled.  4)Depression--- stable, continue Zoloft 200 mg daily and buspirone 7.5 mg twice daily, trazodone and Elavil for sleep  5)Elevated LFTs--- acute hepatitis profile was negative at outside facility, need to closely and consider  stopping Lipitor, right upper quadrant ultrasound >status post cholecystectomy, no biliary duct dilatation and no focal lesions identified.  6)H/o PE--- no anticoagulation due to history of bleeding from peptic ulcers, IVC in situ  7)Acute on chronic Anemia--recent hemoglobin apparently usually 10, monitor closely and transfuse as clinically indicated, suspect acute blood loss due to orthopedic procedures and hemodilution from IV fluids superimposed on anemia of chronic disease.  Given history of prior GI bleed if H&H continues to drop consider further GI work-up, c/n  PPI.    Hemoglobin has dropped to 7.1 in the absence of overt bleeding.  FOBT negative.  Repeat CBC now and closely follow again in a.m.  Transfuse if hemoglobin <7 g per DL.  Hypomagnesemia and hypokalemia: Replace and follow   DVT prophylaxis: Heparin Code Status: Full Family Communication: None at bedside Disposition: DC to SNF pending adequate pain control on oral pain regimen, weaning of IV pain medications and continued medical stability including of her anemia.   Consultants:  Vascular surgery  Procedures:  None  Antimicrobials:  IV ceftezolin   Subjective: Ongoing significant pain of right lower extremity and surgical pain.  Not controlled with current pain regimen.  As per nursing, has been getting around-the-clock IV morphine, Percocet orally and asking to increase dose of Percocet.  ROS: As above, otherwise negative.  No melena or bleeding reported.  Objective:  Vitals:   11/03/17 1511 11/03/17 1600 11/03/17 1639 11/03/17 1739  BP: (!) 123/49   123/63  Pulse: 79   81  Resp: _0 (!) 25  Temp: 98.5 F (36.9 C)     TempSrc:      SpO2: 100%     Weight:      Height:        Examination:  General exam: Pleasant elderly female, moderately built and nourished, lying comfortably propped up in bed without distress. Respiratory system: Clear to auscultation. Respiratory effort  normal. Cardiovascular system: S1 & S2 heard, RRR. No JVD, murmurs, rubs, gallops or clicks. No pedal edema.  Telemetry personally reviewed: Sinus rhythm. Gastrointestinal system: Abdomen is nondistended, soft and nontender. No organomegaly or masses felt. Normal bowel sounds heard. Central nervous system: Alert and oriented. No focal neurological deficits. Extremities: Symmetric 5 x 5 power.  Right lower extremity surgical scar over right lateral thigh and over the knee healing well.  Minimal warmth but no redness or open wounds.  Mildly tender to touch. Skin: No rashes, lesions or ulcers Psychiatry: Judgement and insight appear normal. Mood & affect appropriate.     Data Reviewed: I have personally reviewed following labs and imaging studies  CBC: Recent Labs  Lab 10/30/17 2000 11/01/17 0407 11/02/17 0314 11/03/17 0229  WBC 13.2* 13.1* 14.0* 13.1*  NEUTROABS 10.2*  --   --   --   HGB 8.0* 8.3* 8.0* 7.1*  HCT 25.5* 27.3* 26.3* 23.7*  MCV 89.8 91.0 91.0 91.2  PLT 319 380 371 453   Basic Metabolic Panel: Recent Labs  Lab 10/30/17 2000 11/01/17 0407 11/02/17 0314 11/03/17 0229 11/03/17 0747  NA 134* 139 138 138  --  K 3.3* 3.6 3.7 3.0*  --   CL 100 106 103 106  --   CO2 _0 --   GLUCOSE 123* 128* 102* 181*  --   BUN 6* 9 <5* 7*  --   CREATININE 0.66 0.63 0.52 0.68  --   CALCIUM 7.6* 7.7* 7.5* 6.9*  --   MG  --   --   --   --  1.6*   Liver Function Tests: Recent Labs  Lab 10/30/17 2000 11/01/17 0407 11/03/17 0229  AST 103* 65* 38  ALT 90* 62* 31  ALKPHOS 94 85 91  BILITOT 0.4 0.4 0.1*  PROT 5.8* 5.6* 5.2*  ALBUMIN 1.8* 1.8* 1.6*      Radiology Studies: No results found.      Scheduled Meds: . amitriptyline  25 mg Oral QHS  . atorvastatin  20 mg Oral q1800  . busPIRone  7.5 mg Oral BID  . clopidogrel  75 mg Oral Daily  . dicyclomine  20 mg Oral TID AC  . gabapentin  100 mg Oral BID  . heparin  5,000 Units Subcutaneous Q8H  . metoprolol  tartrate  50 mg Oral BID  . oxyCODONE  10 mg Oral Q8H  . pantoprazole  40 mg Oral Daily  . sertraline  100 mg Oral Daily  . sodium chloride flush  3 mL Intravenous Q12H  . sucralfate  1 g Oral TID WC & HS  . traZODone  50 mg Oral QHS  . vitamin B-12  1,000 mcg Oral Daily   Continuous Infusions: . sodium chloride 10 mL (11/03/17 1807)  .  ceFAZolin (ANCEF) IV 2 g (11/03/17 1407)  . magnesium sulfate 1 - 4 g bolus IVPB 2 g (11/03/17 1808)     LOS: 4 days     Vernell Leep, MD, FACP, [facility]. [facility] Pager [phone]  If 7PM-7AM, please contact night-coverage [email or web address] Password [email or web address] 11/03/2017, 6:50 PM

## 2017-11-03 NOTE — Progress Notes (Signed)
[facility] [place]  Subjective  -  84 year old female recently admitted to [facility] for right knee septic joint.  She was transferred here for vascular surgery evaluation given an incidental finding on a CT of a right fem distal bypass occlusion.    ABI 0.5, TP 18.    Objective (!) 143/52 82 98 F (36.7 C) 18 100%  Intake/Output Summary (Last 24 hours) at 11/03/2017 1341 Last data filed at 11/03/2017 1000 Gross per 24 hour  Intake 600 ml  Output 650 ml  Net -50 ml   PE General: NAD, resting Extremities: Palpable femoral pulses bilateral groins, no hematoma in left groin after access DP/PT signals RLE, PT signal LLE Motor intact, right foot warm  Assessment/Planning:  84 year old female with septic right knee joint found to have incidental occlusion of an old right fem distal bypass on CT.   After vein mapping yesterday patient does not have usable saphenous vein for a fem below-knee popliteal bypass.  Ultimately we would either have to use prosthetic graft or cryo-vein.  Given that she has no significant rest pain and the small wound between her first and second toe is minimal I have recommended to her family to get her to rehab and/or SNF in the interim.  I would like to avoid having to tunnel a new bypass through her septic joint/soft tissue abscess unless absolutely necessary.  I will see her back in clinic in a few weeks and we will see how she is doing.  Discussed with daughter if her wound gets worse we will certainly will be more aggressive.   Marty Heck 11/03/2017 1:41 PM -- Marty Heck, MD [facility] [place] Office: [phone] Pager: [phone]

## 2017-11-03 NOTE — Progress Notes (Signed)
Pt's lab work resulted, provider on call notified.

## 2017-11-03 NOTE — Progress Notes (Signed)
Report given to [facility].  Pt has no s/s of any acute distress.  Will medicate pt at this time with prn Percocet.

## 2017-11-04 DIAGNOSIS — D62 Acute posthemorrhagic anemia: Secondary | ICD-10-CM

## 2017-11-04 DIAGNOSIS — T82898S Other specified complication of vascular prosthetic devices, implants and grafts, sequela: Secondary | ICD-10-CM

## 2017-11-04 LAB — IRON AND TIBC
Iron: 15 ug/dL — ABNORMAL LOW (ref 28–170)
Saturation Ratios: 7 % — ABNORMAL LOW (ref 10.4–31.8)
TIBC: 225 ug/dL — ABNORMAL LOW (ref 250–450)
UIBC: 210 ug/dL

## 2017-11-04 LAB — BASIC METABOLIC PANEL
Anion gap: 9 (ref 5–15)
BUN: 5 mg/dL — ABNORMAL LOW (ref 8–23)
CO2: 24 mmol/L (ref 22–32)
Calcium: 7.3 mg/dL — ABNORMAL LOW (ref 8.9–10.3)
Chloride: 105 mmol/L (ref 98–111)
Creatinine, Ser: 0.62 mg/dL (ref 0.44–1.00)
GFR calc Af Amer: >60 mL/min (ref >60)
GFR calc non Af Amer: >60 mL/min (ref >60)
Glucose, Bld: 109 mg/dL — ABNORMAL HIGH (ref 70–99)
Potassium: 4.1 mmol/L (ref 3.5–5.1)
Sodium: 138 mmol/L (ref 135–145)

## 2017-11-04 LAB — MAGNESIUM: Magnesium: 2.3 mg/dL (ref 1.7–2.4)

## 2017-11-04 LAB — CBC
HCT: 23.4 % — ABNORMAL LOW (ref 36.0–46.0)
Hemoglobin: 7 g/dL — ABNORMAL LOW (ref 12.0–15.0)
MCH: 27.5 pg (ref 26.0–34.0)
MCHC: 29.9 g/dL — ABNORMAL LOW (ref 30.0–36.0)
MCV: 91.8 fL (ref 78.0–100.0)
Platelets: 458 10*3/uL — ABNORMAL HIGH (ref 150–400)
RBC: 2.55 MIL/uL — ABNORMAL LOW (ref 3.87–5.11)
RDW: 15.8 % — ABNORMAL HIGH (ref 11.5–15.5)
WBC: 13.7 10*3/uL — ABNORMAL HIGH (ref 4.0–10.5)

## 2017-11-04 LAB — VITAMIN B12: Vitamin B-12: 2940 pg/mL — ABNORMAL HIGH (ref 180–914)

## 2017-11-04 LAB — RETICULOCYTES
RBC.: 2.45 MIL/uL — ABNORMAL LOW (ref 3.87–5.11)
Retic Count, Absolute: 66.2 10*3/uL (ref 19.0–186.0)
Retic Ct Pct: 2.7 % (ref 0.4–3.1)

## 2017-11-04 LAB — FERRITIN: Ferritin: 153 ng/mL (ref 11–307)

## 2017-11-04 LAB — PREPARE RBC (CROSSMATCH)

## 2017-11-04 LAB — FOLATE: Folate: 12.7 ng/mL (ref >5.9)

## 2017-11-04 LAB — HEMOGLOBIN AND HEMATOCRIT, BLOOD
HCT: 28.4 % — ABNORMAL LOW (ref 36.0–46.0)
Hemoglobin: 9 g/dL — ABNORMAL LOW (ref 12.0–15.0)

## 2017-11-04 MED ORDER — OXYCODONE-ACETAMINOPHEN 7.5-325 MG PO TABS
2.0000 | ORAL_TABLET | Freq: Four times a day (QID) | ORAL | Status: DC | PRN
  Administered 2017-11-04 – 2017-11-11 (×19): 2 via ORAL
  Filled 2017-11-04 (×19): qty 2

## 2017-11-04 MED ORDER — MORPHINE SULFATE (PF) 4 MG/ML IV SOLN
4.0000 mg | INTRAVENOUS | Status: DC | PRN
  Administered 2017-11-04 – 2017-11-05 (×7): 4 mg via INTRAVENOUS
  Filled 2017-11-04 (×7): qty 1

## 2017-11-04 MED ORDER — SODIUM CHLORIDE 0.9% IV SOLUTION
Freq: Once | INTRAVENOUS | Status: AC
  Administered 2017-11-04: 15:00:00 via INTRAVENOUS

## 2017-11-04 MED ORDER — GABAPENTIN 100 MG PO CAPS
100.0000 mg | ORAL_CAPSULE | Freq: Three times a day (TID) | ORAL | Status: DC
  Administered 2017-11-04 – 2017-11-11 (×21): 100 mg via ORAL
  Filled 2017-11-04 (×21): qty 1

## 2017-11-04 NOTE — NC FL2 (Signed)
[place] LEVEL OF CARE SCREENING TOOL     IDENTIFICATION  Patient Name: Courtney Grant Birthdate: 1933-01-29 Sex: female Admission Date (Current Location): 10/30/2017  [facility] and [place] Number:  Herbalist and Address:  [facility], [place] [address] [place], [place] [address]      Provider Number: [id number]  Attending Physician Name and Address:  Modena Jansky, MD  Relative Name and Phone Number:  [name], daughter, [phone]    Current Level of Care: Hospital Recommended Level of Care: [place] Prior Approval Number:    Date Approved/Denied:   PASRR Number: [id number]  Discharge Plan: SNF    Current Diagnoses: Patient Active Problem List   Diagnosis Date Noted  . MSSA bacteremia 10/30/2017  . Occlusion of right femoral-popliteal bypass graft ([place]) 10/30/2017  . Chronic back pain 10/30/2017  . Normocytic anemia 10/30/2017  . Septic arthritis of knee, right ([place]) 10/30/2017  . Occlusion of right femoropopliteal bypass graft ([place]) 10/30/2017  . History of bleeding peptic ulcer 10/30/2017  . Abnormal transaminases   . Abdominal pain 01/29/2016  . Altered gait 01/29/2016  . Arthritis 01/29/2016  . Biliary dyskinesia 01/29/2016  . Cancer ([place]) 01/29/2016  . Depression 01/29/2016  . Dizziness 01/29/2016  . Fibromyalgia 01/29/2016  . Hiatal hernia with GERD 01/29/2016  . Hypertension 01/29/2016  . Nausea 01/29/2016  . PONV (postoperative nausea and vomiting) 01/29/2016  . History of pulmonary embolism 01/29/2016  . Shortness of breath 01/29/2016  . Stroke ([place]) 01/29/2016  . Tongue thick 01/29/2016  . Urgency incontinence 01/29/2016  . Benign paroxysmal positional vertigo 10/25/2015  . Venous thrombosis   . COPD (chronic obstructive pulmonary disease) ([place])   . Tobacco abuse   . Cancer of lung ([place])   . NONSPECIFIC ABN FINDING RAD & OTH EXAM GI TRACT 08/20/2009    Orientation  RESPIRATION BLADDER Height & Weight     Self, Time, Situation, Place  Normal Continent, External catheter Weight: 158 lb 15.2 oz (72.1 kg) Height:  5\' 3"  (160 cm)  BEHAVIORAL SYMPTOMS/MOOD NEUROLOGICAL BOWEL NUTRITION STATUS      Incontinent Diet(please see DC summary)  AMBULATORY STATUS COMMUNICATION OF NEEDS Skin   Extensive Assist Verbally Surgical wounds, Other (Comment)(closed incision R knee, open wound R lateral toe)                       Personal Care Assistance Level of Assistance  Bathing, Feeding, Dressing Bathing Assistance: Limited assistance Feeding assistance: Independent Dressing Assistance: Limited assistance     Functional Limitations Info  Sight, Hearing, Speech Sight Info: Adequate Hearing Info: Adequate Speech Info: Adequate    SPECIAL CARE FACTORS FREQUENCY  PT (By licensed PT)     PT Frequency: 5x/week              Contractures Contractures Info: Not present    Additional Factors Info  Psychotropic, Code Status, Allergies, Isolation Precautions Code Status Info: Full Allergies Info: Aspirin, Codeine, Ibuprofen, Penicillins Psychotropic Info: buspar, zoloft, trazadone   Isolation Precautions Info: contact precautions     Current Medications (11/04/2017):  This is the current hospital active medication list Current Facility-Administered Medications  Medication Dose Route Frequency Provider Last Rate Last Dose  . 0.9 %  sodium chloride infusion (Manually program via Guardrails IV Fluids)   Intravenous Once Hongalgi, Anand D, MD      . 0.9 %  sodium chloride infusion  250 mL Intravenous  PRN Waynetta Sandy, MD 10 mL/hr at 11/03/17 1807 10 mL at 11/03/17 1807  . acetaminophen (TYLENOL) tablet 650 mg  650 mg Oral Q6H PRN Opyd, Ilene Qua, MD   650 mg at 11/04/17 0310   Or  . acetaminophen (TYLENOL) suppository 650 mg  650 mg Rectal Q6H PRN Opyd, Ilene Qua, MD      . albuterol (PROVENTIL) (2.5 MG/3ML) 0.083% nebulizer solution 2.5 mg   2.5 mg Nebulization Q6H PRN Opyd, Ilene Qua, MD      . amitriptyline (ELAVIL) tablet 25 mg  25 mg Oral QHS Opyd, Ilene Qua, MD   25 mg at 11/03/17 2019  . atorvastatin (LIPITOR) tablet 20 mg  20 mg Oral q1800 Opyd, Ilene Qua, MD   20 mg at 11/03/17 1806  . busPIRone (BUSPAR) tablet 7.5 mg  7.5 mg Oral BID Opyd, Ilene Qua, MD   7.5 mg at 11/04/17 0900  . ceFAZolin (ANCEF) IVPB 2g/100 mL premix  2 g Intravenous Q8H Emokpae, Courage, MD 200 mL/hr at 11/04/17 0545 2 g at 11/04/17 0545  . clopidogrel (PLAVIX) tablet 75 mg  75 mg Oral Daily Emokpae, Courage, MD   75 mg at 11/04/17 0901  . dicyclomine (BENTYL) tablet 20 mg  20 mg Oral TID AC Opyd, Ilene Qua, MD   20 mg at 11/04/17 1154  . gabapentin (NEURONTIN) capsule 100 mg  100 mg Oral TID Vernell Leep D, MD      . heparin injection 5,000 Units  5,000 Units Subcutaneous Q8H Opyd, Ilene Qua, MD   5,000 Units at 11/04/17 0507  . hydrALAZINE (APRESOLINE) injection 10 mg  10 mg Intravenous Q6H PRN Emokpae, Courage, MD      . metoprolol tartrate (LOPRESSOR) tablet 50 mg  50 mg Oral BID Denton Brick, Courage, MD   50 mg at 11/04/17 0901  . morphine 4 MG/ML injection 4 mg  4 mg Intravenous Q3H PRN Modena Jansky, MD   4 mg at 11/04/17 1154  . ondansetron (ZOFRAN) tablet 4 mg  4 mg Oral Q6H PRN Opyd, Ilene Qua, MD       Or  . ondansetron (ZOFRAN) injection 4 mg  4 mg Intravenous Q6H PRN Opyd, Ilene Qua, MD      . oxyCODONE (OXYCONTIN) 12 hr tablet 10 mg  10 mg Oral Q8H Hongalgi, Lenis Dickinson, MD   10 mg at 11/04/17 0509  . oxyCODONE-acetaminophen (PERCOCET) 7.5-325 MG per tablet 2 tablet  2 tablet Oral Q6H PRN Hongalgi, Anand D, MD      . pantoprazole (PROTONIX) EC tablet 40 mg  40 mg Oral Daily Opyd, Ilene Qua, MD   40 mg at 11/04/17 0901  . senna-docusate (Senokot-S) tablet 1 tablet  1 tablet Oral QHS PRN Opyd, Ilene Qua, MD      . sertraline (ZOLOFT) tablet 100 mg  100 mg Oral Daily Opyd, Ilene Qua, MD   100 mg at 11/04/17 0902  . sodium chloride flush (NS)  0.9 % injection 3 mL  3 mL Intravenous Q12H Waynetta Sandy, MD   3 mL at 11/04/17 0902  . sodium chloride flush (NS) 0.9 % injection 3 mL  3 mL Intravenous PRN Waynetta Sandy, MD      . sucralfate (CARAFATE) tablet 1 g  1 g Oral TID WC & HS Opyd, Ilene Qua, MD   1 g at 11/04/17 1154  . traZODone (DESYREL) tablet 50 mg  50 mg Oral QHS Opyd, Ilene Qua, MD   50 mg  at 11/03/17 2018  . vitamin B-12 (CYANOCOBALAMIN) tablet 1,000 mcg  1,000 mcg Oral Daily Opyd, Ilene Qua, MD   1,000 mcg at 11/04/17 0900     Discharge Medications: Please see discharge summary for a list of discharge medications.  Relevant Imaging Results:  Relevant Lab Results:   Additional Information SSN: [id number]  Estanislado Emms, LCSW

## 2017-11-04 NOTE — Clinical Social Work Note (Signed)
Clinical Social Work Assessment  Patient Details  Name: Courtney Grant MRN: [id number] Date of Birth: 10/24/1932  Date of referral:  11/04/17               Reason for consult:  Facility Placement, Discharge Planning                Permission sought to share information with:  Facility Sport and exercise psychologist, Family Supports Permission granted to share information::  Yes, Verbal Permission Granted  Name::     Courtney Grant  Agency::  SNFs  Relationship::  daughter  Contact Information:  [phone]  Housing/Transportation Living arrangements for the past 2 months:  Single Family Home Source of Information:  Patient Patient Interpreter Needed:  None Criminal Activity/Legal Involvement Pertinent to Current Situation/Hospitalization:  No - Comment as needed Significant Relationships:  Adult Children Lives with:  Adult Children Do you feel safe going back to the place where you live?  Yes Need for family participation in patient care:  Yes (Comment)  Care giving concerns: Patient from home with daughter and son-in-law. PT recommending SNF. Patient will also need long term IV antibiotics.   Social Worker assessment / plan: CSW met with patient at bedside. Patient alert and oriented, but preoccupied with pain. CSW introduced self and role and discussed disposition plan. Patient hopeful to return home, but open to SNF fax out. [facility] if going to SNF. CSW faxed out initial referrals. Noted patient will be on IV antibiotics for several weeks. CSW to follow ans support with disposition planning.  Employment status:  Retired Research officer, political party) PT Recommendations:  [place] / Referral to community resources:  [place]  Patient/Family's Response to care: Patient appreciative of care.  Patient/Family's Understanding of and Emotional Response to Diagnosis, Current Treatment, and Prognosis:  Patient with understanding of her condition. Hopeful for home but open to consider SNF.  Emotional Assessment Appearance:  Appears stated age Attitude/Demeanor/Rapport:  Engaged Affect (typically observed):  Appropriate, Other(distracted by pain) Orientation:  Oriented to Self, Oriented to Place, Oriented to  Time, Oriented to Situation Alcohol / Substance use:  Not Applicable Psych involvement (Current and /or in the community):  No (Comment)  Discharge Needs  Concerns to be addressed:  Discharge Planning Concerns, Care Coordination Readmission within the last 30 days:  No Current discharge risk:  Physical Impairment Barriers to Discharge:  Continued Medical Work up   Estanislado Emms, LCSW 11/04/2017, 1:05 PM

## 2017-11-04 NOTE — Progress Notes (Addendum)
PROGRESS NOTE   Courtney Grant  SEG:[id number]    DOB: 1932-06-02    DOA: 10/30/2017  PCP: Garwin Brothers, MD   I have briefly reviewed patients previous medical records in [facility].  Brief Narrative:  84 y.o.femalewith pmhx ofCOPD, hypertension, history of PE with IVC filter not anticoagulated due to history of bleeding ulcer, peripheral arterial disease with history of right femoral-popliteal bypass graft, depression, chronic pain syndrome followed by outpatient pain MD, HLD, GERD, lung nodules, history of ORIF of right distal femur fracture complicated by nonhealing heel ulcer for which she underwent vascular bypass, remote history of proximal femur fractures status post closed reduction, internal fixation with IM nailing as well as proximal tibia and distal tibia fractures which were initially openly reduced and then finally closed reduced with nailing, who presented to her orthopedic surgeon's office (Dr Joya Salm and [facility])  for evaluation of increasing right knee pain and swelling.  A week prior to that visit she had received a steroid injection for history of degenerative arthritis of the knee by her primary care physician.  CT scan revealed a distal right thigh abscess that was suspected to be an implant infection and also showed femoral bypass graft occlusion, she had aspirate that was purulent, and she was then admitted to [facility] on 10/28/2017.  She underwent right I&D and implant removal on 8/16. Initially treated with Vanco/Zosyn, however joint fluid culture and blood cultures grew pansensitive MSSA, patient was changed to IV Ancef on 10/30/2017.  Transferred to [facility] on 10/30/17 due to concerns about right fem-pop bypass graft occlusionand for vascular surgery evaluation.  ABI on 10/31/2017 shows moderate to severe reduction in the arterial flow with abnormal TBI's.  Vascular surgery plans no interventions at this time and plans to follow  outpatient.   Assessment & Plan:   Principal Problem:   Occlusion of right femoral-popliteal bypass graft (HCC) Active Problems:   COPD (chronic obstructive pulmonary disease) (HCC)   Depression   Hypertension   History of pulmonary embolism   MSSA bacteremia   Chronic back pain   Normocytic anemia   Septic arthritis of knee, right (HCC)   Occlusion of right femoropopliteal bypass graft (HCC)   History of bleeding peptic ulcer   Abnormal transaminases  Right knee septic arthritis, right distal thigh abscess with implant infection She had prior history of right hip and knee fracture status post nail, screw and femoral plate placement.  CT had shown right lateral thigh abscess concerning for hardware infection.  Knee aspiration prior to surgery was purulent and confirmed pansensitive MSSA. Now status post open right distal thigh abscess irrigation and debridement, right distal femur deep implant removal, right knee arthroscopic irrigation and debridement on 10/29/2017.   Hospitalist MD at [facility] had discussed with Dr. Bobby Rumpf, ID at [facility] health who had initially recommended IV vancomycin and ceftriaxone which was then changed to cefazolin on 8/18 based on culture results.  As per orthopedics/Dr. Joya Salm will nonweightbearing on right lower extremity x4-6 weeks minimum, daily PT for lower extremity strengthening and transfer on left lower extremity and monitor closely for recurrence of abscess or septic arthritis which may need orthopedic reevaluation and further surgery.  Current findings do not suggest recurrence and will continue to monitor closely. On 10/30/2017 ESR was over 140 and CRP was over 27, check ESR and CRP in 7 to 10 days to monitor response to treatment.  Over the last 2 days of my caring  for her, her pain has not been adequately controlled.  I had adjusted her pain regimen on 8/21 (resumed prior home MS Contin, continued Percocet as needed and reduced IV  morphine as needed).  Patient reports no significant improvement in her pain which was confirmed by nursing.  Increased/adjusted Percocet and morphine.  Increased gabapentin for possible neuropathic component. She reports chronic pain, follows with outpatient pain management (Dr. Humphrey Rolls) and is on MS Contin at home.  Need to control pain better prior to discharge.  If continues to have uncontrolled pain, will request orthopedic team to reevaluate right lower extremity.  I have consulted and discussed with Dr. Michel Bickers, ID and requested him to see patient in consult tomorrow.  In the interim we will request TTE and surveillance blood cultures x2.  MSSA bacteremia Has been on IV cefazolin since 8/18.  Consider ID discussion or consultation to determine duration of total antibiotics for septic arthritis and MSSA bacteremia.  PAD-- Right Fem-Pop bypass graft occlusionon imaging studies,  ???? Chronicity, ABI on 10/31/2017 shows moderate to severe reduction in the arterial flow with abnormal TBI's, Vascular surgery consult from Dr. Carlis Abbott appreciated.  Aortogram with bilateral lower extremity runoff 8/19 with occlusion of right SFA (Rt femoropopliteal bypass graft appears to have failed). Continue Lipitor and Plavix . Discussed with Dr. Carlis Abbott he would like patient to get antibiotic treatment for anxious etiology above for the next couple of weeks and he will follow-up with patient as outpatient in 2 to 3 weeks to determine when and if further vascular interventions need to be done.   Essential hypertension:  Controlled.  Continue metoprolol.  Continue PRN IV hydralazine.  Hyperlipidemia:  Continue atorvastatin.  Depression  stable, continue Zoloft 200 mg daily and buspirone 7.5 mg twice daily, trazodone and Elavil for sleep  Elevated LFTs---  Acute hepatitis profile was negative at outside facility, need to closely monitor and consider stopping Lipitor,  Rright upper quadrant ultrasound  >status post cholecystectomy, no biliary duct dilatation and no focal lesions identified. LFTs have normalized.  Does have significant hypoalbuminemia due to acute illness.  History of PE status post IVC filter:  Reportedly not on anticoagulation candidate due to history of bleeding from peptic ulcers, IVC in situ.  Subacute on chronic anemia:  recent hemoglobin apparently usually 10. Suspect progressive recent anemia to be multifactorial: Recent blood loss during orthopedic surgery at [facility], acute illness related bone marrow suppression, daily phlebotomies, iron deficiency and possible complicating underlying chronic disease.  No overt bleeding.  FOBT negative.  Hemoglobin dropped to 7 on 8/22 and patient somewhat symptomatic with worsening dizziness.  Transfuse 1 unit PRBC as agreed by patient and daughter. Anemia panel appreciated, iron deficiency.  Oral iron supplements.  Hypomagnesemia and hypokalemia:  Replaced.  COPD:  Stable without clinical bronchospasm.  GERD PPI  Chronic Pain Mx as above.   DVT prophylaxis: Heparin Code Status: Full Family Communication: Discussed in detail with patient's daughter via phone.  Updated care and answered questions. Disposition: DC to SNF pending adequate pain control on oral pain regimen, weaning of IV pain medications and continued medical stability including of her anemia, transfusing today.   Consultants:  Vascular surgery  Procedures:  None  Antimicrobials:  IV cefazolin   Subjective: Reports no overt bleeding.  Indicates that over the last couple days, has intermittent dizziness that is worse than her chronic dizziness.  No chest pain or dyspnea.  Ongoing severe right lower extremity pain at knee and thigh, not  controlled by current pain regimen.  ROS: As above, otherwise negative.  No melena or bleeding reported.  Objective:  Vitals:   11/04/17 0417 11/04/17 0900 11/04/17 1119 11/04/17 1200  BP: 104/66  107/61 (!) 117/55 118/68  Pulse: 81 86 79 83  Resp: 15 13 20  (!) 21  Temp: 98.4 F (36.9 C)  98.5 F (36.9 C) (!) 97.3 F (36.3 C)  TempSrc: Oral  Oral Axillary  SpO2: 98% 99% 100% 99%  Weight: 72.1 kg     Height: 5' 3"  (1.6 m)       Examination:  General exam: Pleasant elderly female, moderately built and nourished, lying comfortably propped up in bed without distress.  Respiratory system: Clear to auscultation. Respiratory effort normal.  Stable Cardiovascular system: S1 & S2 heard, RRR. No JVD, murmurs, rubs, gallops or clicks. No pedal edema.  Stable Gastrointestinal system: Abdomen is nondistended, soft and nontender. No organomegaly or masses felt. Normal bowel sounds heard.  Stable Central nervous system: Alert and oriented. No focal neurological deficits.  Has chronic speech abnormality.  Stable Extremities: Symmetric 5 x 5 power.  Right lower extremity surgical scar over right lateral thigh and over the knee healing well.  Minimal warmth but no redness or open wounds.  Tender to touch. Skin: No rashes, lesions or ulcers Psychiatry: Judgement and insight appear normal. Mood & affect appropriate.     Data Reviewed: I have personally reviewed following labs and imaging studies  CBC: Recent Labs  Lab 10/30/17 2000 11/01/17 0407 11/02/17 0314 11/03/17 0229 11/03/17 1947 11/04/17 0303  WBC 13.2* 13.1* 14.0* 13.1* 13.0* 13.7*  NEUTROABS 10.2*  --   --   --   --   --   HGB 8.0* 8.3* 8.0* 7.1* 7.3* 7.0*  HCT 25.5* 27.3* 26.3* 23.7* 24.1* 23.4*  MCV 89.8 91.0 91.0 91.2 91.3 91.8  PLT 319 380 371 364 458* 757*   Basic Metabolic Panel: Recent Labs  Lab 10/30/17 2000 11/01/17 0407 11/02/17 0314 11/03/17 0229 11/03/17 0747 11/04/17 0303  NA 134* 139 138 138  --  138  K 3.3* 3.6 3.7 3.0*  --  4.1  CL 100 106 103 106  --  105  CO2 25 25 23 24   --  24  GLUCOSE 123* 128* 102* 181*  --  109*  BUN 6* 9 <5* 7*  --  5*  CREATININE 0.66 0.63 0.52 0.68  --  0.62  CALCIUM  7.6* 7.7* 7.5* 6.9*  --  7.3*  MG  --   --   --   --  1.6* 2.3   Liver Function Tests: Recent Labs  Lab 10/30/17 2000 11/01/17 0407 11/03/17 0229  AST 103* 65* 38  ALT 90* 62* 31  ALKPHOS 94 85 91  BILITOT 0.4 0.4 0.1*  PROT 5.8* 5.6* 5.2*  ALBUMIN 1.8* 1.8* 1.6*      Radiology Studies: No results found.      Scheduled Meds: . sodium chloride   Intravenous Once  . amitriptyline  25 mg Oral QHS  . atorvastatin  20 mg Oral q1800  . busPIRone  7.5 mg Oral BID  . clopidogrel  75 mg Oral Daily  . dicyclomine  20 mg Oral TID AC  . gabapentin  100 mg Oral TID  . heparin  5,000 Units Subcutaneous Q8H  . metoprolol tartrate  50 mg Oral BID  . oxyCODONE  10 mg Oral Q8H  . pantoprazole  40 mg Oral Daily  . sertraline  100  mg Oral Daily  . sodium chloride flush  3 mL Intravenous Q12H  . sucralfate  1 g Oral TID WC & HS  . traZODone  50 mg Oral QHS  . vitamin B-12  1,000 mcg Oral Daily   Continuous Infusions: . sodium chloride 10 mL (11/03/17 1807)  .  ceFAZolin (ANCEF) IV 2 g (11/04/17 0545)     LOS: 5 days     Vernell Leep, MD, FACP, [facility]. [facility] Pager [phone]  If 7PM-7AM, please contact night-coverage [email or web address] Password [facility] 11/04/2017, 2:13 PM

## 2017-11-04 NOTE — Progress Notes (Signed)
Physical Therapy Treatment Patient Details Name: Courtney Grant MRN: [id number] DOB: [birth year] Today's Date: 11/04/2017    History of Present Illness Pt adm to [facility] on 8/15 with rt septic knee, rt thigh abscess, and likely infected hardware. Pt also with occluded rt fem-pop bypass graft. On 10/29/17 at [facility] pt underwent I&D of rt knee/thigh and removal of plate and screws from previous ORIF. Pt transferred to [facility] on 8/17 for vascular consult concerning occluded bypass graft. PMH - rt femur fx x 2,  COPD, hypertension, history of PE with IVC filter not anticoagulated due to history of bleeding ulcer, peripheral arterial disease with history of right femoral-pop bypass, depression.    PT Comments    Pt continues with slow progress limited by pain and NWB status of RLE. Continue to recommend ST-SNF at dc.   Follow Up Recommendations  SNF     Equipment Recommendations  Wheelchair (measurements PT);Wheelchair cushion (measurements PT)    Recommendations for Other Services       Precautions / Restrictions Precautions Precautions: Fall Restrictions Weight Bearing Restrictions: Yes RLE Weight Bearing: Non weight bearing Other Position/Activity Restrictions: x 4 weeks per ortho at [facility] bed mobility: Needs Assistance Bed Mobility: Supine to Sit;Sit to Supine     Supine to sit: Min assist;HOB elevated Sit to supine: Min assist   General bed mobility comments: Assist to bring RLE off/on bed  Transfers                 General transfer comment: Pt deferred. Reports when pain gets under control then someone has her move and results in incr pain. Pt laterally scooted on EOB toward [place] with min assist  Ambulation/Gait             General Gait Details: unable due to pain/NWB status   Stairs             Wheelchair Mobility    Modified Rankin (Stroke Patients Only)       Balance Overall balance  assessment: Needs assistance Sitting-balance support: Feet supported;No upper extremity supported Sitting balance-Leahy Scale: Good Sitting balance - Comments: static                                    Cognition Arousal/Alertness: Awake/alert Behavior During Therapy: WFL for tasks assessed/performed Overall Cognitive Status: Within Functional Limits for tasks assessed                                        Exercises General Exercises - Lower Extremity Ankle Circles/Pumps: AROM;10 reps;Both;Seated Long Arc Quad: AROM;Both;10 reps;Seated(rt with limited range of motion due to pain)    General Comments        Pertinent Vitals/Pain Pain Assessment: Faces Faces Pain Scale: Hurts even more Pain Location: R knee Pain Descriptors / Indicators: Grimacing;Guarding Pain Intervention(s): Limited activity within patient's tolerance;Monitored during session;Repositioned;Premedicated before session    Home Living                      Prior Function            PT Goals (current goals can now be found in the care plan section) Progress towards PT goals: Not progressing toward goals - comment(limited by pain)    Frequency  Min 3X/week      PT Plan Current plan remains appropriate    Co-evaluation              AM-PAC PT "6 Clicks" Daily Activity  Outcome Measure  Difficulty turning over in bed (including adjusting bedclothes, sheets and blankets)?: A Lot Difficulty moving from lying on back to sitting on the side of the bed? : Unable Difficulty sitting down on and standing up from a chair with arms (e.g., wheelchair, bedside commode, etc,.)?: Unable Help needed moving to and from a bed to chair (including a wheelchair)?: A Lot Help needed walking in hospital room?: Total Help needed climbing 3-5 steps with a railing? : Total 6 Click Score: 8    End of Session   Activity Tolerance: Patient limited by pain Patient left: with  call bell/phone within reach;in bed;with bed alarm set Nurse Communication: Mobility status PT Visit Diagnosis: Other abnormalities of gait and mobility (R26.89);Pain Pain - Right/Left: Right Pain - part of body: Knee     Time: 0955-1009 PT Time Calculation (min) (ACUTE ONLY): 14 min  Charges:  $Therapeutic Activity: 8-22 mins                     [facility] PT [place] 11/04/2017, 12:07 PM

## 2017-11-05 ENCOUNTER — Telehealth: Payer: Self-pay | Admitting: Vascular Surgery

## 2017-11-05 ENCOUNTER — Other Ambulatory Visit (HOSPITAL_COMMUNITY): Payer: Medicare Other

## 2017-11-05 DIAGNOSIS — Z886 Allergy status to analgesic agent status: Secondary | ICD-10-CM

## 2017-11-05 DIAGNOSIS — F329 Major depressive disorder, single episode, unspecified: Secondary | ICD-10-CM

## 2017-11-05 DIAGNOSIS — F17210 Nicotine dependence, cigarettes, uncomplicated: Secondary | ICD-10-CM

## 2017-11-05 DIAGNOSIS — Z885 Allergy status to narcotic agent status: Secondary | ICD-10-CM

## 2017-11-05 DIAGNOSIS — B9561 Methicillin susceptible Staphylococcus aureus infection as the cause of diseases classified elsewhere: Secondary | ICD-10-CM

## 2017-11-05 DIAGNOSIS — R7881 Bacteremia: Secondary | ICD-10-CM

## 2017-11-05 DIAGNOSIS — M00061 Staphylococcal arthritis, right knee: Secondary | ICD-10-CM

## 2017-11-05 DIAGNOSIS — L02415 Cutaneous abscess of right lower limb: Secondary | ICD-10-CM

## 2017-11-05 DIAGNOSIS — T82868A Thrombosis of vascular prosthetic devices, implants and grafts, initial encounter: Secondary | ICD-10-CM

## 2017-11-05 DIAGNOSIS — Z880 Allergy status to penicillin: Secondary | ICD-10-CM

## 2017-11-05 LAB — TYPE AND SCREEN
ABO/RH(D): O POS
Antibody Screen: NEGATIVE
Unit division: 0

## 2017-11-05 LAB — BPAM RBC
Blood Product Expiration Date: 201909202359
ISSUE DATE / TIME: 201908221127
Unit Type and Rh: 5100

## 2017-11-05 LAB — CBC
HCT: 30.7 % — ABNORMAL LOW (ref 36.0–46.0)
Hemoglobin: 9.4 g/dL — ABNORMAL LOW (ref 12.0–15.0)
MCH: 28.5 pg (ref 26.0–34.0)
MCHC: 30.6 g/dL (ref 30.0–36.0)
MCV: 93 fL (ref 78.0–100.0)
Platelets: 465 10*3/uL — ABNORMAL HIGH (ref 150–400)
RBC: 3.3 MIL/uL — ABNORMAL LOW (ref 3.87–5.11)
RDW: 15.8 % — ABNORMAL HIGH (ref 11.5–15.5)
WBC: 13.1 10*3/uL — ABNORMAL HIGH (ref 4.0–10.5)

## 2017-11-05 MED ORDER — OXYCODONE HCL ER 15 MG PO T12A
15.0000 mg | EXTENDED_RELEASE_TABLET | Freq: Two times a day (BID) | ORAL | Status: DC
  Administered 2017-11-05 – 2017-11-11 (×12): 15 mg via ORAL
  Filled 2017-11-05 (×12): qty 1

## 2017-11-05 MED ORDER — MORPHINE SULFATE (PF) 2 MG/ML IV SOLN
2.0000 mg | INTRAVENOUS | Status: DC | PRN
  Administered 2017-11-05 – 2017-11-07 (×4): 2 mg via INTRAVENOUS
  Filled 2017-11-05 (×4): qty 1

## 2017-11-05 NOTE — Progress Notes (Addendum)
Pharmacy Antibiotic Note  Courtney Grant is a 84 y.o. female admitted on 10/30/2017 with MSSA bacteremia.  Pharmacy has been consulted for cefazolin dosing.  Transferred from [facility] after open R distal thigh abcess I&D, R distal femur deep implant removal and R knee arthroscopic I&D on 8/16. BCx on 8/15 grew staph aureus (MSSA - on BCID) per records from [place].  CT scan showing distal R thigh abscess and concern for R fem-pop bypass graft occlusion.   Renal function stable, afebrile, WBC improving.  Plan for 4-6 weeks of antibiotics.   Plan: Continue Ancef 2gm IV Q8H Pharmacy will sign off.  Thank you for the consult!   Height: 5\' 3"  (160 cm) Weight: 158 lb 4.6 oz (71.8 kg) IBW/kg (Calculated) : 52.4  Temp (24hrs), Avg:98.4 F (36.9 C), Min:97.3 F (36.3 C), Max:99 F (37.2 C)  Recent Labs  Lab 10/30/17 2000 11/01/17 0407 11/02/17 0314 11/03/17 0229 11/03/17 1947 11/04/17 0303 11/05/17 0737  WBC 13.2* 13.1* 14.0* 13.1* 13.0* 13.7* 13.1*  CREATININE 0.66 0.63 0.52 0.68  --  0.62  --     Estimated Creatinine Clearance: 50.6 mL/min (by C-G formula based on SCr of 0.62 mg/dL).    Allergies  Allergen Reactions  . Aspirin Nausea And Vomiting  . Codeine Nausea And Vomiting  . Ibuprofen Nausea And Vomiting  . Penicillins Rash    Has patient had a PCN reaction causing immediate rash, facial/tongue/throat swelling, SOB or lightheadedness with hypotension: YES Has patient had a PCN reaction causing severe rash involving mucus membranes or skin necrosis: NO Has patient had a PCN reaction that required hospitalization: NO Has patient had a PCN reaction occurring within the last 10 years: YES If all of the above answers are "NO", then may proceed with Cephalosporin use.     Ancef 8/17 >> Vanc/CTX at OSH  8/15 BCx: staph aureus (BCID MSSA) at OSH 8/22 BCx -    [place] Karl D. Mina Marble, PharmD, BCPS, [place] 11/05/2017, 9:49 AM

## 2017-11-05 NOTE — Telephone Encounter (Signed)
[facility] appt spk to pt 11/23/17 315pm f/u MD

## 2017-11-05 NOTE — Progress Notes (Signed)
PROGRESS NOTE   Courtney Grant  YSA:[id number]    DOB: 05-20-1932    DOA: 10/30/2017  PCP: Garwin Brothers, MD   I have briefly reviewed patients previous medical records in [facility].  Brief Narrative:  84 y.o.femalewith pmhx ofCOPD, hypertension, history of PE with IVC filter not anticoagulated due to history of bleeding ulcer, peripheral arterial disease with history of right femoral-popliteal bypass graft, depression, chronic pain syndrome followed by outpatient pain MD, HLD, GERD, lung nodules, history of ORIF of right distal femur fracture complicated by nonhealing heel ulcer for which she underwent vascular bypass, remote history of proximal femur fractures status post closed reduction, internal fixation with IM nailing as well as proximal tibia and distal tibia fractures which were initially openly reduced and then finally closed reduced with nailing, who presented to her orthopedic surgeon's office (Dr Joya Salm and [facility])  for evaluation of increasing right knee pain and swelling.  A week prior to that visit she had received a steroid injection for history of degenerative arthritis of the knee by her primary care physician.  CT scan revealed a distal right thigh abscess that was suspected to be an implant infection and also showed femoral bypass graft occlusion, she had aspirate that was purulent, and she was then admitted to [facility] on 10/28/2017.  She underwent right I&D and implant removal on 8/16. Initially treated with Vanco/Zosyn, however joint fluid culture and blood cultures grew pansensitive MSSA, patient was changed to IV Ancef on 10/30/2017.  Transferred to [facility] on 10/30/17 due to concerns about right fem-pop bypass graft occlusionand for vascular surgery evaluation.  ABI on 10/31/2017 shows moderate to severe reduction in the arterial flow with abnormal TBI's.  Vascular surgery plans no interventions at this time and plans to follow  outpatient.   Assessment & Plan:   Principal Problem:   Occlusion of right femoral-popliteal bypass graft (HCC) Active Problems:   COPD (chronic obstructive pulmonary disease) (HCC)   Depression   Hypertension   History of pulmonary embolism   Bacteremia due to methicillin susceptible Staphylococcus aureus (MSSA)   Chronic back pain   Normocytic anemia   Septic arthritis of knee, right (HCC)   History of bleeding peptic ulcer   Abnormal transaminases  Right knee septic arthritis, right distal thigh abscess with implant infection She had prior history of right hip and knee fracture status post nail, screw and femoral plate placement.  CT had shown right lateral thigh abscess concerning for hardware infection.  Knee aspiration prior to surgery was purulent and confirmed pansensitive MSSA. Now status post open right distal thigh abscess irrigation and debridement, right distal femur deep implant removal, right knee arthroscopic irrigation and debridement on 10/29/2017.   Hospitalist MD at [facility] had discussed with Dr. Bobby Rumpf, ID at [facility] health who had initially recommended IV vancomycin and ceftriaxone which was then changed to cefazolin on 8/18 based on culture results.  As per orthopedics/Dr. Joya Salm will nonweightbearing on right lower extremity x4-6 weeks minimum, daily PT for lower extremity strengthening and transfer on left lower extremity and monitor closely for recurrence of abscess or septic arthritis which may need orthopedic reevaluation and further surgery.  Current findings do not suggest recurrence and will continue to monitor closely. On 10/30/2017 ESR was over 140 and CRP was over 27, check ESR and CRP in 7 to 10 days to monitor response to treatment.  Midweek, her pain had not been adequately controlled.  I have  been judiciously adjusting her pain medications for the last couple days.  Extensive discussion with patient's daughter on 8/23.  Attempt to wean  her off IV morphine.  Right lateral thigh and knee not suggestive of recurrence of acute infection.  ID/Dr. Michel Bickers input appreciated, follow-up on surveillance blood cultures 8/22 and TTE results to rule out endocarditis.  Decision regarding duration of antibiotics will be made based on that and then will need to place PICC line.  MSSA bacteremia Has been on IV cefazolin since 8/18.  ID input as above.  PAD-- Right Fem-Pop bypass graft occlusionon imaging studies,  ???? Chronicity, ABI on 10/31/2017 shows moderate to severe reduction in the arterial flow with abnormal TBI's, Vascular surgery consult from Dr. Carlis Abbott appreciated.  Aortogram with bilateral lower extremity runoff 8/19 with occlusion of right SFA (Rt femoropopliteal bypass graft appears to have failed). Continue Lipitor and Plavix . Discussed with Dr. Carlis Abbott he would like patient to get antibiotic treatment for anxious etiology above for the next couple of weeks and he will follow-up with patient as outpatient in 2 to 3 weeks to determine when and if further vascular interventions need to be done.   Essential hypertension:  Controlled.  Continue metoprolol.  Continue PRN IV hydralazine.  Hyperlipidemia:  Continue atorvastatin.  Depression  stable, continue Zoloft 200 mg daily and buspirone 7.5 mg twice daily, trazodone and Elavil for sleep  Elevated LFTs---  Acute hepatitis profile was negative at outside facility, need to closely monitor and consider stopping Lipitor,  Rright upper quadrant ultrasound >status post cholecystectomy, no biliary duct dilatation and no focal lesions identified. LFTs have normalized.  Does have significant hypoalbuminemia due to acute illness.  History of PE status post IVC filter:  Reportedly not on anticoagulation candidate due to history of bleeding from peptic ulcers, IVC in situ.  Subacute on chronic anemia:  recent hemoglobin apparently usually 10. Suspect progressive recent anemia  to be multifactorial: Recent blood loss during orthopedic surgery at [facility], acute illness related bone marrow suppression, daily phlebotomies, iron deficiency and possible complicating underlying chronic disease.  No overt bleeding.  FOBT negative.  Hemoglobin dropped to 7 on 8/22 and patient somewhat symptomatic with worsening dizziness.  Transfuse 1 unit PRBC as agreed by patient and daughter. Anemia panel appreciated, iron deficiency.  Oral iron supplements. Hemoglobin is improved to 9.4.  Follow CBCs periodically and closely.  Hypomagnesemia and hypokalemia:  Replaced.  COPD:  Stable without clinical bronchospasm.  GERD PPI  Chronic Pain Mx as above.   DVT prophylaxis: Heparin Code Status: Full Family Communication: Discussed in detail with patient's daughter via phone.  Updated care and answered questions again today. Disposition: DC to SNF pending adequate pain control on oral pain regimen, weaning of IV pain medications and continued medical stability including of her anemia, transfusing today.   Consultants:  Vascular surgery  Procedures:  None  Antimicrobials:  IV cefazolin   Subjective: States that her right knee/lower extremity pain is about 6-7/10 prior to medication and significantly improved after medication.  Having BMs.  No other complaints reported.  ROS: As above, otherwise negative.  No melena or bleeding reported.  Objective:  Vitals:   11/04/17 1200 11/04/17 1431 11/04/17 1945 11/05/17 0420  BP: 118/68 (!) 99/55 130/70 131/72  Pulse: 83 83 84 76  Resp: (!) 21 (!) 23 17 15   Temp: (!) 97.3 F (36.3 C) 98.3 F (36.8 C) 99 F (37.2 C) 98.7 F (37.1 C)  TempSrc: Axillary Oral  Oral Oral  SpO2: 99% 100% 100% 100%  Weight:    71.8 kg  Height:        Examination: No significant change in exam compared to yesterday.  General exam: Pleasant elderly female, moderately built and nourished, lying comfortably propped up in bed without  distress.  Respiratory system: Clear to auscultation. Respiratory effort normal.  Stable Cardiovascular system: S1 & S2 heard, RRR. No JVD, murmurs, rubs, gallops or clicks. No pedal edema.  Stable Gastrointestinal system: Abdomen is nondistended, soft and nontender. No organomegaly or masses felt. Normal bowel sounds heard.  Stable Central nervous system: Alert and oriented. No focal neurological deficits.  Has chronic speech abnormality.  Stable Extremities: Symmetric 5 x 5 power.  Right lower extremity surgical scar over right lateral thigh and over the knee healing well.  Minimal warmth but no redness or open wounds.  Tender to touch. Skin: No rashes, lesions or ulcers Psychiatry: Judgement and insight appear normal. Mood & affect appropriate.     Data Reviewed: I have personally reviewed following labs and imaging studies  CBC: Recent Labs  Lab 10/30/17 2000  11/02/17 0314 11/03/17 0229 11/03/17 1947 11/04/17 0303 11/04/17 1644 11/05/17 0737  WBC 13.2*   < > 14.0* 13.1* 13.0* 13.7*  --  13.1*  NEUTROABS 10.2*  --   --   --   --   --   --   --   HGB 8.0*   < > 8.0* 7.1* 7.3* 7.0* 9.0* 9.4*  HCT 25.5*   < > 26.3* 23.7* 24.1* 23.4* 28.4* 30.7*  MCV 89.8   < > 91.0 91.2 91.3 91.8  --  93.0  PLT 319   < > 371 364 458* 458*  --  465*   < > = values in this interval not displayed.   Basic Metabolic Panel: Recent Labs  Lab 10/30/17 2000 11/01/17 0407 11/02/17 0314 11/03/17 0229 11/03/17 0747 11/04/17 0303  NA 134* 139 138 138  --  138  K 3.3* 3.6 3.7 3.0*  --  4.1  CL 100 106 103 106  --  105  CO2 25 25 23 24   --  24  GLUCOSE 123* 128* 102* 181*  --  109*  BUN 6* 9 <5* 7*  --  5*  CREATININE 0.66 0.63 0.52 0.68  --  0.62  CALCIUM 7.6* 7.7* 7.5* 6.9*  --  7.3*  MG  --   --   --   --  1.6* 2.3   Liver Function Tests: Recent Labs  Lab 10/30/17 2000 11/01/17 0407 11/03/17 0229  AST 103* 65* 38  ALT 90* 62* 31  ALKPHOS 94 85 91  BILITOT 0.4 0.4 0.1*  PROT 5.8* 5.6*  5.2*  ALBUMIN 1.8* 1.8* 1.6*      Radiology Studies: No results found.      Scheduled Meds: . amitriptyline  25 mg Oral QHS  . atorvastatin  20 mg Oral q1800  . busPIRone  7.5 mg Oral BID  . clopidogrel  75 mg Oral Daily  . dicyclomine  20 mg Oral TID AC  . gabapentin  100 mg Oral TID  . heparin  5,000 Units Subcutaneous Q8H  . metoprolol tartrate  50 mg Oral BID  . oxyCODONE  15 mg Oral BID  . pantoprazole  40 mg Oral Daily  . sertraline  100 mg Oral Daily  . sodium chloride flush  3 mL Intravenous Q12H  . sucralfate  1 g Oral TID WC &  HS  . traZODone  50 mg Oral QHS  . vitamin B-12  1,000 mcg Oral Daily   Continuous Infusions: . sodium chloride 250 mL (11/05/17 0906)  .  ceFAZolin (ANCEF) IV 2 g (11/05/17 1310)     LOS: 6 days     Vernell Leep, MD, FACP, [facility]. [facility] Pager [phone]  If 7PM-7AM, please contact night-coverage [email or web address] Password [facility] 11/05/2017, 4:44 PM

## 2017-11-05 NOTE — Progress Notes (Signed)
Attempted to get patient in chair x2. Patient refusing each time stating "she hasn't had a wink of sleep and now that she has had pain medicine doesn't want it to wear off from moving" Nurse educated patient on why she should get into chair but patient states she "will get in chair tomorrow for lunch." Nursing will monitor

## 2017-11-05 NOTE — Consult Note (Addendum)
[facility] for Infectious Disease    Date of Admission:  10/30/2017   Total days of antibiotics 9        Day 7 cefazolin               Reason for Consult: MSSA bacteremia complicating septic right knee and thigh abscess   Referring Provider: Dr. Vernell Leep  Assessment: She has a septic right knee and thigh abscess amputated by MSSA bacteremia.  Repeat blood cultures and transthoracic echocardiogram are pending.  The studies will help determine the optimal duration of therapy.  Plan: 1. Continue cefazolin 2. Await results of repeat blood cultures and TTE 3. Please call Dr. Talbot Grumbling [phone]) for any infectious disease questions this weekend  Principal Problem:   Occlusion of right femoral-popliteal bypass graft [facility]) Active Problems:   Bacteremia due to methicillin susceptible Staphylococcus aureus (MSSA)   Septic arthritis of knee, right (HCC)   COPD (chronic obstructive pulmonary disease) ([place])   Depression   Hypertension   History of pulmonary embolism   Chronic back pain   Normocytic anemia   History of bleeding peptic ulcer   Abnormal transaminases   Scheduled Meds: . amitriptyline  25 mg Oral QHS  . atorvastatin  20 mg Oral q1800  . busPIRone  7.5 mg Oral BID  . clopidogrel  75 mg Oral Daily  . dicyclomine  20 mg Oral TID AC  . gabapentin  100 mg Oral TID  . heparin  5,000 Units Subcutaneous Q8H  . metoprolol tartrate  50 mg Oral BID  . oxyCODONE  15 mg Oral BID  . pantoprazole  40 mg Oral Daily  . sertraline  100 mg Oral Daily  . sodium chloride flush  3 mL Intravenous Q12H  . sucralfate  1 g Oral TID WC & HS  . traZODone  50 mg Oral QHS  . vitamin B-12  1,000 mcg Oral Daily   Continuous Infusions: . sodium chloride 250 mL (11/05/17 0906)  .  ceFAZolin (ANCEF) IV 2 g (11/05/17 0610)   PRN Meds:.sodium chloride, acetaminophen **OR** acetaminophen, albuterol, hydrALAZINE, morphine injection, ondansetron **OR** ondansetron (ZOFRAN)  IV, oxyCODONE-acetaminophen, senna-docusate, sodium chloride flush  HPI: Courtney Grant is a 84 y.o. female with degenerative arthritis who has had several procedures on her right knee.  She tells me that she had fluid drained off and then she had some sort of injection.  She developed painful swelling of her right leg with fever and was admitted to the hospital in [place] on 10/28/2017.  She was found to have a right thigh abscess and septic arthritis.  Admission blood cultures grew MSSA.  She underwent incision and drainage was started on antibiotics.  She was found to have occlusion of her right femoral to popliteal vascular graft and was transferred here on 10/30/2016.  She has been afebrile but continues to have right knee pain.   Review of Systems: Review of Systems  Constitutional: Negative for chills, diaphoresis and fever.  Gastrointestinal: Negative for abdominal pain, diarrhea, nausea and vomiting.  Musculoskeletal: Positive for joint pain.  Psychiatric/Behavioral: Positive for depression.    Past Medical History:  Diagnosis Date  . Benign paroxysmal positional vertigo 10/25/2015  . Complication of anesthesia   . COPD (chronic obstructive pulmonary disease) ([place])   . Lung cancer ([place]) 02/23/2016   ADENOCARINOMA RUL  . PONV (postoperative nausea and vomiting)   . Tobacco abuse   . Venous thrombosis  H/O    Social History   Tobacco Use  . Smoking status: Current Every Day Smoker    Packs/day: 1.00    Types: Cigarettes  . Smokeless tobacco: Never Used  Substance Use Topics  . Alcohol use: Yes    Comment: Couple of drinks per day  . Drug use: No    Family History  Problem Relation Age of Onset  . Liver cancer Mother   . Lung cancer Father    Allergies  Allergen Reactions  . Aspirin Nausea And Vomiting  . Codeine Nausea And Vomiting  . Ibuprofen Nausea And Vomiting  . Penicillins Rash    Has patient had a PCN reaction causing immediate rash,  facial/tongue/throat swelling, SOB or lightheadedness with hypotension: YES Has patient had a PCN reaction causing severe rash involving mucus membranes or skin necrosis: NO Has patient had a PCN reaction that required hospitalization: NO Has patient had a PCN reaction occurring within the last 10 years: YES If all of the above answers are "NO", then may proceed with Cephalosporin use.    OBJECTIVE: Blood pressure 131/72, pulse 76, temperature 98.7 F (37.1 C), temperature source Oral, resp. rate 15, height 5\' 3"  (1.6 m), weight 71.8 kg, SpO2 100 %.  Physical Exam  Constitutional: She is oriented to person, place, and time.  She is resting quietly in bed.  She frequently winces in pain because of right knee pain.  Cardiovascular: Normal rate, regular rhythm and normal heart sounds.  Very distant heart sounds.  Pulmonary/Chest: Effort normal and breath sounds normal.  Musculoskeletal:  She is clean, dry incisions around her knee and lateral right thigh.  Neurological: She is alert and oriented to person, place, and time.  Skin: No rash noted.    Lab Results Lab Results  Component Value Date   WBC 13.1 (H) 11/05/2017   HGB 9.4 (L) 11/05/2017   HCT 30.7 (L) 11/05/2017   MCV 93.0 11/05/2017   PLT 465 (H) 11/05/2017    Lab Results  Component Value Date   CREATININE 0.62 11/04/2017   BUN 5 (L) 11/04/2017   NA 138 11/04/2017   K 4.1 11/04/2017   CL 105 11/04/2017   CO2 24 11/04/2017    Lab Results  Component Value Date   ALT 31 11/03/2017   AST 38 11/03/2017   ALKPHOS 91 11/03/2017   BILITOT 0.1 (L) 11/03/2017     Microbiology: No results found for this or any previous visit (from the past 240 hour(s)).  Michel Bickers, MD [facility] [place] [facility] [phone] pager   [phone] cell 11/05/2017, 9:30 AM

## 2017-11-05 NOTE — Progress Notes (Signed)
  Transitions of Care Polypharmacy Medication Deprescribing Review  Courtney Grant is an 84 y.o. female admitted with septic arthritis and MSSA bacteremia on 10/30/2017.   The patient's PTA medications and comorbidities were reviewed to identify opportunities for medication optimization through deprescribing.   Short-Term Recommendations: . Consider discontinuing amitriptyline due to risk for adverse events and presence on [place] List, especially since patient reports no improvement in insomnia with the medication.   Long-Term Recommendations: . Consider whether long-term pantoprazole 40 mg and sucralfate 1 g is necessary for peptic ulcer history or if patient has been sufficiently treated and would benefit from de-escalation. . Consider the need for dicyclomine and meclizine long-term due to risk of anticholinergic adverse effects and presence on Beers List.   Patient Assessment:  . Extent of Polypharmacy: 19 PTA medications, hyperpolypharmacy (10+ meds) o Associated risk: severe o Number of home Rx meds: 15 o Number of home OTC meds: 4 . Charlson Comorbidity Index (CCI): 8 o Associated risk: morbid (0% estimated 10-year survival) . GerontoNet Adverse Drug Event Risk Score: 6 o Associated risk: moderate (~12% risk ADE)  Overall risk: moderate to high; higher risk comorbidities and hyperpolypharmacy from Rx meds alone    Medication Assessment:  . Potentially Inappropriate Medications (PIMs)  o High risk medications: 3 - Amitriptyline, dicyclomine, meclizine o Moderate risk medications: 2 - Oxycodone, pantoprazole o Low risk medications: 0 . Medications Associated with Geriatric Syndromes (MAGS): 10 o Amitriptyline, buspirone, clopidogrel, dicyclomine, meclizine, metoprolol tartrate, oxycodone, pantoprazole, sertraline, trazodone  Overall risk: high; multiple high and moderate risk PIMs   The patient was interviewed to determine her interest in making medication changes. Shared  decision-making was used to determine the optimal medications to deprescribe.   Patient Deprescribing Readiness: . Patient Opinions About Deprescribing survey score: 6 o Score of -8 to -6: negative patient opinion of deprescribing; deprescribing likely to be unsuccessful o Score of -4 to 4: intermediate patient opinion of deprescribing; deprescribing may be successful o Score of 6 to 8: positive patient opinion of deprescribing; deprescribing likely to be successful   Shared Decision-Making Discussion:  . Patient expressed interest in deprescribing, noting that she does feel like she takes a lot of pills. Conversation was limited due to amount of acute pain patient experiencing. She was not knowledgeable about medication names or usages, stating that her daughter keeps track of all of her medications. . Patient denied concerns about medications, expressing only that every medication she has tried has been ineffective for her insomnia. Patient was advised of risks associated with amitriptyline, and she expressed that it has not been helping her with sleep. . Patient was unable to express how often she takes dicyclomine or meclizine at home, noting that she just takes the medications that are given to her to take. . She expressed interest in decreasing the number of medications she takes if possible, but also wants to make sure we are trying enough different medications to help her symptoms.    Please see above for final recommendations.  Courtney Grant, Student-PharmD 11/05/2017 11:41 AM

## 2017-11-06 ENCOUNTER — Inpatient Hospital Stay (HOSPITAL_COMMUNITY): Payer: Medicare Other

## 2017-11-06 DIAGNOSIS — R7881 Bacteremia: Secondary | ICD-10-CM

## 2017-11-06 LAB — CBC
HCT: 30.8 % — ABNORMAL LOW (ref 36.0–46.0)
Hemoglobin: 9.4 g/dL — ABNORMAL LOW (ref 12.0–15.0)
MCH: 28.2 pg (ref 26.0–34.0)
MCHC: 30.5 g/dL (ref 30.0–36.0)
MCV: 92.5 fL (ref 78.0–100.0)
Platelets: 502 10*3/uL — ABNORMAL HIGH (ref 150–400)
RBC: 3.33 MIL/uL — ABNORMAL LOW (ref 3.87–5.11)
RDW: 15.8 % — ABNORMAL HIGH (ref 11.5–15.5)
WBC: 15.1 10*3/uL — ABNORMAL HIGH (ref 4.0–10.5)

## 2017-11-06 LAB — ECHOCARDIOGRAM COMPLETE
Height: 63 in
Weight: 2627.88 oz

## 2017-11-06 NOTE — Progress Notes (Signed)
  Echocardiogram 2D Echocardiogram has been performed.  Courtney Grant 11/06/2017, 2:45 PM

## 2017-11-06 NOTE — Progress Notes (Signed)
Called echo lab.  2D echo on the list to be completed today by echo tech Chelsea.

## 2017-11-06 NOTE — Progress Notes (Signed)
PROGRESS NOTE   Courtney Grant  WPY:[id number]    DOB: 1932/05/30    DOA: 10/30/2017  PCP: Garwin Brothers, MD   I have briefly reviewed patients previous medical records in [facility].  Brief Narrative:  84 y.o.femalewith pmhx ofCOPD, hypertension, history of PE with IVC filter not anticoagulated due to history of bleeding ulcer, peripheral arterial disease with history of right femoral-popliteal bypass graft, depression, chronic pain syndrome followed by outpatient pain MD, HLD, GERD, lung nodules, history of ORIF of right distal femur fracture complicated by nonhealing heel ulcer for which she underwent vascular bypass, remote history of proximal femur fractures status post closed reduction, internal fixation with IM nailing as well as proximal tibia and distal tibia fractures which were initially openly reduced and then finally closed reduced with nailing, who presented to her orthopedic surgeon's office (Dr Joya Salm and [facility])  for evaluation of increasing right knee pain and swelling.  A week prior to that visit she had received a steroid injection for history of degenerative arthritis of the knee by her primary care physician.  CT scan revealed a distal right thigh abscess that was suspected to be an implant infection and also showed femoral bypass graft occlusion, she had aspirate that was purulent, and she was then admitted to [facility] on 10/28/2017.  She underwent right I&D and implant removal on 8/16. Initially treated with Vanco/Zosyn, however joint fluid culture and blood cultures grew pansensitive MSSA, patient was changed to IV Ancef on 10/30/2017.  Transferred to [facility] on 10/30/17 due to concerns about right fem-pop bypass graft occlusionand for vascular surgery evaluation.  ABI on 10/31/2017 shows moderate to severe reduction in the arterial flow with abnormal TBI's.  Vascular surgery plans no interventions at this time and plans to follow  outpatient.   Assessment & Plan:   Principal Problem:   Occlusion of right femoral-popliteal bypass graft (HCC) Active Problems:   COPD (chronic obstructive pulmonary disease) (HCC)   Depression   Hypertension   History of pulmonary embolism   Bacteremia due to methicillin susceptible Staphylococcus aureus (MSSA)   Chronic back pain   Normocytic anemia   Septic arthritis of knee, right (HCC)   History of bleeding peptic ulcer   Abnormal transaminases  Right knee septic arthritis, right distal thigh abscess with implant infection She had prior history of right hip and knee fracture status post nail, screw and femoral plate placement.  CT had shown right lateral thigh abscess concerning for hardware infection.  Knee aspiration prior to surgery was purulent and confirmed pansensitive MSSA. Now status post open right distal thigh abscess irrigation and debridement, right distal femur deep implant removal, right knee arthroscopic irrigation and debridement on 10/29/2017.   Hospitalist MD at [facility] had discussed with Dr. Bobby Rumpf, ID at [facility] health who had initially recommended IV vancomycin and ceftriaxone which was then changed to cefazolin on 8/18 based on culture results.  As per orthopedics/Dr. Joya Salm will nonweightbearing on right lower extremity x4-6 weeks minimum, daily PT for lower extremity strengthening and transfer on left lower extremity and monitor closely for recurrence of abscess or septic arthritis which may need orthopedic reevaluation and further surgery.  Current findings do not suggest recurrence and will continue to monitor closely. On 10/30/2017 ESR was over 140 and CRP was over 27, check ESR and CRP in 7 to 10 days to monitor response to treatment.  Midweek, her pain had not been adequately controlled.  I have  been judiciously adjusting her pain medications for the last couple days.  Extensive discussion with patient's daughter on 8/23.  Attempt to wean  her off IV morphine.  Right lateral thigh and knee not suggestive of recurrence of acute infection.  ID/Dr. Michel Bickers input appreciated, follow-up on surveillance blood cultures 8/22 (no growth to date after 2 days) and TTE results (done and results pending) to rule out endocarditis .  Decision regarding duration of antibiotics will be made based on that and then will need to place PICC line.  Could probably consider placing PICC line tomorrow if cultures remain negative.  Continue to wean off of morphine.  Improving.  MSSA bacteremia Has been on IV cefazolin since 8/18.  ID input as above.  PAD-- Right Fem-Pop bypass graft occlusionon imaging studies,  ???? Chronicity, ABI on 10/31/2017 shows moderate to severe reduction in the arterial flow with abnormal TBI's, Vascular surgery consult from Dr. Carlis Abbott appreciated.  Aortogram with bilateral lower extremity runoff 8/19 with occlusion of right SFA (Rt femoropopliteal bypass graft appears to have failed). Continue Lipitor and Plavix . Discussed with Dr. Carlis Abbott he would like patient to get antibiotic treatment for anxious etiology above for the next couple of weeks and he will follow-up with patient as outpatient in 2 to 3 weeks to determine when and if further vascular interventions need to be done.   Essential hypertension:  Controlled.  Continue metoprolol.  Continue PRN IV hydralazine.  Hyperlipidemia:  Continue atorvastatin.  Depression  stable, continue Zoloft 200 mg daily and buspirone 7.5 mg twice daily, trazodone and Elavil for sleep  Elevated LFTs---  Acute hepatitis profile was negative at outside facility, need to closely monitor and consider stopping Lipitor,  Rright upper quadrant ultrasound >status post cholecystectomy, no biliary duct dilatation and no focal lesions identified. LFTs have normalized.  Does have significant hypoalbuminemia due to acute illness.  History of PE status post IVC filter:  Reportedly not on  anticoagulation candidate due to history of bleeding from peptic ulcers, IVC in situ.  Subacute on chronic anemia:  recent hemoglobin apparently usually 10. Suspect progressive recent anemia to be multifactorial: Recent blood loss during orthopedic surgery at [facility], acute illness related bone marrow suppression, daily phlebotomies, iron deficiency and possible complicating underlying chronic disease.  No overt bleeding.  FOBT negative.  Hemoglobin dropped to 7 on 8/22 and patient somewhat symptomatic with worsening dizziness.  Transfuse 1 unit PRBC as agreed by patient and daughter. Anemia panel appreciated, iron deficiency.  Oral iron supplements. Hemoglobin improved and stable in the 9 g range.  Hypomagnesemia and hypokalemia:  Replaced.  COPD:  Stable without clinical bronchospasm.  GERD PPI  Chronic Pain Mx as above.   DVT prophylaxis: Heparin Code Status: Full Family Communication: Discussed in detail with patient's daughter via phone.  Updated care and answered questions again today. Disposition: DC to SNF possibly in the next 48 hours pending PICC line placement after blood cultures negative, TTE negative and bed availability.   Consultants:  Vascular surgery  Procedures:  None  Antimicrobials:  IV cefazolin   Subjective: Ongoing pain in right lower extremity, seems out of proportion to how she looks.  ROS: As above, otherwise negative.  No melena or bleeding reported.  Objective:  Vitals:   11/06/17 0824 11/06/17 1045 11/06/17 1220 11/06/17 1235  BP:  (!) 106/53  (!) 125/58  Pulse: (!) 55 84 80 85  Resp: _0 Temp:    98.9 F (37.2  C)  TempSrc:    Oral  SpO2: 99%  97% 99%  Weight:      Height:        Examination: No significant change in exam compared to yesterday.  General exam: Pleasant elderly female, moderately built and nourished, lying comfortably propped up in bed without distress.  Respiratory system: Clear to auscultation.  Respiratory effort normal.  Stable Cardiovascular system: S1 & S2 heard, RRR. No JVD, murmurs, rubs, gallops or clicks. No pedal edema.  Telemetry personally reviewed: Sinus rhythm with occasional nonsustained SVT. Gastrointestinal system: Abdomen is nondistended, soft and nontender. No organomegaly or masses felt. Normal bowel sounds heard.  Stable Central nervous system: Alert and oriented. No focal neurological deficits.  Has chronic speech abnormality.  Stable Extremities: Symmetric 5 x 5 power.  Right lower extremity surgical scar over right lateral thigh and over the knee healing well.  Minimal warmth but no redness or open wounds.  Tender to touch. Skin: No rashes, lesions or ulcers Psychiatry: Judgement and insight appear normal. Mood & affect appropriate.     Data Reviewed: I have personally reviewed following labs and imaging studies  CBC: Recent Labs  Lab 10/30/17 2000  11/03/17 0229 11/03/17 1947 11/04/17 0303 11/04/17 1644 11/05/17 0737 11/06/17 0804  WBC 13.2*   < > 13.1* 13.0* 13.7*  --  13.1* 15.1*  NEUTROABS 10.2*  --   --   --   --   --   --   --   HGB 8.0*   < > 7.1* 7.3* 7.0* 9.0* 9.4* 9.4*  HCT 25.5*   < > 23.7* 24.1* 23.4* 28.4* 30.7* 30.8*  MCV 89.8   < > 91.2 91.3 91.8  --  93.0 92.5  PLT 319   < > 364 458* 458*  --  465* 502*   < > = values in this interval not displayed.   Basic Metabolic Panel: Recent Labs  Lab 10/30/17 2000 11/01/17 0407 11/02/17 0314 11/03/17 0229 11/03/17 0747 11/04/17 0303  NA 134* 139 138 138  --  138  K 3.3* 3.6 3.7 3.0*  --  4.1  CL 100 106 103 106  --  105  CO2 _0 --  24  GLUCOSE 123* 128* 102* 181*  --  109*  BUN 6* 9 <5* 7*  --  5*  CREATININE 0.66 0.63 0.52 0.68  --  0.62  CALCIUM 7.6* 7.7* 7.5* 6.9*  --  7.3*  MG  --   --   --   --  1.6* 2.3   Liver Function Tests: Recent Labs  Lab 10/30/17 2000 11/01/17 0407 11/03/17 0229  AST 103* 65* 38  ALT 90* 62* 31  ALKPHOS 94 85 91  BILITOT 0.4 0.4 0.1*   PROT 5.8* 5.6* 5.2*  ALBUMIN 1.8* 1.8* 1.6*      Radiology Studies: No results found.      Scheduled Meds: . amitriptyline  25 mg Oral QHS  . atorvastatin  20 mg Oral q1800  . busPIRone  7.5 mg Oral BID  . clopidogrel  75 mg Oral Daily  . dicyclomine  20 mg Oral TID AC  . gabapentin  100 mg Oral TID  . heparin  5,000 Units Subcutaneous Q8H  . metoprolol tartrate  50 mg Oral BID  . oxyCODONE  15 mg Oral BID  . pantoprazole  40 mg Oral Daily  . sertraline  100 mg Oral Daily  . sodium chloride flush  3 mL  Intravenous Q12H  . sucralfate  1 g Oral TID WC & HS  . traZODone  50 mg Oral QHS  . vitamin B-12  1,000 mcg Oral Daily   Continuous Infusions: . sodium chloride 250 mL (11/05/17 0906)  .  ceFAZolin (ANCEF) IV 2 g (11/06/17 1332)     LOS: 7 days     Vernell Leep, MD, FACP, [facility]. [facility] Pager [phone]  If 7PM-7AM, please contact night-coverage [email or web address] Password [facility] 11/06/2017, 2:46 PM

## 2017-11-06 NOTE — Progress Notes (Signed)
Notified by tele that patient had a 13 beat run of svt, stripped saved, paged [place] (on call for [facility]) to notify of change.  Awaiting any new orders patient reports no difficulties at this time.

## 2017-11-07 ENCOUNTER — Inpatient Hospital Stay: Payer: Self-pay

## 2017-11-07 MED ORDER — MORPHINE SULFATE (PF) 2 MG/ML IV SOLN
1.0000 mg | INTRAVENOUS | Status: DC | PRN
  Administered 2017-11-07 – 2017-11-08 (×3): 1 mg via INTRAVENOUS
  Filled 2017-11-07 (×3): qty 1

## 2017-11-07 NOTE — Progress Notes (Signed)
Attempted to place PICC in R basilic vein. Pt intolerant of drape, very anxious.  States is claustrophobic.  Instructed on how to elevate drape and look out the side, but pt kept turning her face inward and becoming agitated.  Pt threw sterile drape off her face and shoulders, contaminating sterile field. Able to access vessel, but when attempting to thread the guidewire, the pt began yelling "What are you doing?!"  Stating it felt like her arm muscle was being ripped apart.  Due to anxiety and agitation, her vessels began constricting.  PICC procedure stopped due to agitation, anxiety and breach of sterile field.  RN notified.  Dr Algis Liming paged and notified.  Will order anti anxiety prior to re attempt on 11/08/17.

## 2017-11-07 NOTE — Progress Notes (Signed)
Attempted to call pt daughter back. Unable to leave voicemail; mailbox full.  Lilla Shook, BSN

## 2017-11-07 NOTE — Progress Notes (Signed)
RN spoke with pt daughter Courtney Grant. Courtney Grant updated on plan of care for pt and informed about plan for PICC line placement.  Pt's daughter verbalized understanding and stated that she will come by tomorrow.   Lilla Shook, BSN

## 2017-11-07 NOTE — Progress Notes (Signed)
PROGRESS NOTE   Courtney Grant  WPY:[id number]    DOB: 1932/05/30    DOA: 10/30/2017  PCP: Garwin Brothers, MD   I have briefly reviewed patients previous medical records in [facility].  Brief Narrative:  84 y.o.femalewith pmhx ofCOPD, hypertension, history of PE with IVC filter not anticoagulated due to history of bleeding ulcer, peripheral arterial disease with history of right femoral-popliteal bypass graft, depression, chronic pain syndrome followed by outpatient pain MD, HLD, GERD, lung nodules, history of ORIF of right distal femur fracture complicated by nonhealing heel ulcer for which she underwent vascular bypass, remote history of proximal femur fractures status post closed reduction, internal fixation with IM nailing as well as proximal tibia and distal tibia fractures which were initially openly reduced and then finally closed reduced with nailing, who presented to her orthopedic surgeon's office (Dr Joya Salm and [facility])  for evaluation of increasing right knee pain and swelling.  A week prior to that visit she had received a steroid injection for history of degenerative arthritis of the knee by her primary care physician.  CT scan revealed a distal right thigh abscess that was suspected to be an implant infection and also showed femoral bypass graft occlusion, she had aspirate that was purulent, and she was then admitted to [facility] on 10/28/2017.  She underwent right I&D and implant removal on 8/16. Initially treated with Vanco/Zosyn, however joint fluid culture and blood cultures grew pansensitive MSSA, patient was changed to IV Ancef on 10/30/2017.  Transferred to [facility] on 10/30/17 due to concerns about right fem-pop bypass graft occlusionand for vascular surgery evaluation.  ABI on 10/31/2017 shows moderate to severe reduction in the arterial flow with abnormal TBI's.  Vascular surgery plans no interventions at this time and plans to follow  outpatient.   Assessment & Plan:   Principal Problem:   Occlusion of right femoral-popliteal bypass graft (HCC) Active Problems:   COPD (chronic obstructive pulmonary disease) (HCC)   Depression   Hypertension   History of pulmonary embolism   Bacteremia due to methicillin susceptible Staphylococcus aureus (MSSA)   Chronic back pain   Normocytic anemia   Septic arthritis of knee, right (HCC)   History of bleeding peptic ulcer   Abnormal transaminases  Right knee septic arthritis, right distal thigh abscess with implant infection She had prior history of right hip and knee fracture status post nail, screw and femoral plate placement.  CT had shown right lateral thigh abscess concerning for hardware infection.  Knee aspiration prior to surgery was purulent and confirmed pansensitive MSSA. Now status post open right distal thigh abscess irrigation and debridement, right distal femur deep implant removal, right knee arthroscopic irrigation and debridement on 10/29/2017.   Hospitalist MD at [facility] had discussed with Dr. Bobby Rumpf, ID at [facility] health who had initially recommended IV vancomycin and ceftriaxone which was then changed to cefazolin on 8/18 based on culture results.  As per orthopedics/Dr. Joya Salm will nonweightbearing on right lower extremity x4-6 weeks minimum, daily PT for lower extremity strengthening and transfer on left lower extremity and monitor closely for recurrence of abscess or septic arthritis which may need orthopedic reevaluation and further surgery.  Current findings do not suggest recurrence and will continue to monitor closely. On 10/30/2017 ESR was over 140 and CRP was over 27, check ESR and CRP in 7 to 10 days to monitor response to treatment.  Midweek, her pain had not been adequately controlled.  I have  been judiciously adjusting her pain medications for the last couple days.  Extensive discussion with patient's daughter on 8/23.  Attempt to wean  her off IV morphine.  Right lateral thigh and knee not suggestive of recurrence of acute infection.  ID/Dr. Michel Bickers input appreciated, follow-up on surveillance blood cultures 8/22 (no growth to date after 2 days-no updates) and TTE with normal LVEF and no mention regarding vegetations.  Discussed with ID MD today and PICC line ordered.  Await ID decision regarding final duration of antibiotics.  Continue to wean off of morphine.  Improving.  MSSA bacteremia Has been on IV cefazolin since 8/18.  ID input as above.  PAD-- Right Fem-Pop bypass graft occlusionon imaging studies,  ???? Chronicity, ABI on 10/31/2017 shows moderate to severe reduction in the arterial flow with abnormal TBI's, Vascular surgery consult from Dr. Carlis Abbott appreciated.  Aortogram with bilateral lower extremity runoff 8/19 with occlusion of right SFA (Rt femoropopliteal bypass graft appears to have failed). Continue Lipitor and Plavix . Discussed with Dr. Carlis Abbott he would like patient to get antibiotic treatment for anxious etiology above for the next couple of weeks and he will follow-up with patient as outpatient in 2 to 3 weeks to determine when and if further vascular interventions need to be done.   Essential hypertension:  Controlled.  Continue metoprolol.  Continue PRN IV hydralazine.  Hyperlipidemia:  Continue atorvastatin.  Depression  stable, continue Zoloft 200 mg daily and buspirone 7.5 mg twice daily, trazodone and Elavil for sleep  Elevated LFTs---  Acute hepatitis profile was negative at outside facility, need to closely monitor and consider stopping Lipitor,  Rright upper quadrant ultrasound >status post cholecystectomy, no biliary duct dilatation and no focal lesions identified. LFTs have normalized.  Does have significant hypoalbuminemia due to acute illness.  History of PE status post IVC filter:  Reportedly not on anticoagulation candidate due to history of bleeding from peptic ulcers, IVC in  situ.  Subacute on chronic anemia:  recent hemoglobin apparently usually 10. Suspect progressive recent anemia to be multifactorial: Recent blood loss during orthopedic surgery at [facility], acute illness related bone marrow suppression, daily phlebotomies, iron deficiency and possible complicating underlying chronic disease.  No overt bleeding.  FOBT negative.  Hemoglobin dropped to 7 on 8/22 and patient somewhat symptomatic with worsening dizziness.  Transfuse 1 unit PRBC as agreed by patient and daughter. Anemia panel appreciated, iron deficiency.  Oral iron supplements. Hemoglobin improved and stable in the 9 g range.  Hypomagnesemia and hypokalemia:  Replaced.  COPD:  Stable without clinical bronchospasm.  GERD PPI  Chronic Pain Mx as above.   DVT prophylaxis: Heparin Code Status: Full Family Communication: Discussed in detail with patient's daughter via phone.  Updated care and answered questions again today. Disposition: DC to SNF possibly in the next 24 hours pending PICC line placement and bed availability.   Consultants:  Vascular surgery Infectious disease  Procedures:  None  Antimicrobials:  IV cefazolin   Subjective: No new complaints.  Chronic pain issues.  As per RN, no acute issues reported.  ROS: As above, otherwise negative.  No melena or bleeding reported.  Objective:  Vitals:   11/07/17 0608 11/07/17 0653 11/07/17 0831 11/07/17 1409  BP:    (!) 122/52  Pulse:    76  Resp:   18 16  Temp: 98.2 F (36.8 C)   98.3 F (36.8 C)  TempSrc: Oral   Oral  SpO2:    98%  Weight:  68.9 kg    Height:        Examination: No significant change in exam over the last couple days.  General exam: Pleasant elderly female, moderately built and nourished, lying comfortably propped up in bed without distress.  Respiratory system: Clear to auscultation. Respiratory effort normal.  Stable Cardiovascular system: S1 & S2 heard, RRR. No JVD, murmurs, rubs,  gallops or clicks. No pedal edema.  Telemetry personally reviewed: Sinus rhythm. Gastrointestinal system: Abdomen is nondistended, soft and nontender. No organomegaly or masses felt. Normal bowel sounds heard.  Stable Central nervous system: Alert and oriented. No focal neurological deficits.  Has chronic speech abnormality.  Stable Extremities: Symmetric 5 x 5 power.  Right lower extremity surgical scar over right lateral thigh and over the knee healing well.  Minimal warmth but no redness or open wounds.  Tender to touch. Skin: No rashes, lesions or ulcers Psychiatry: Judgement and insight appear normal. Mood & affect appropriate.     Data Reviewed: I have personally reviewed following labs and imaging studies  CBC: Recent Labs  Lab 11/03/17 0229 11/03/17 1947 11/04/17 0303 11/04/17 1644 11/05/17 0737 11/06/17 0804  WBC 13.1* 13.0* 13.7*  --  13.1* 15.1*  HGB 7.1* 7.3* 7.0* 9.0* 9.4* 9.4*  HCT 23.7* 24.1* 23.4* 28.4* 30.7* 30.8*  MCV 91.2 91.3 91.8  --  93.0 92.5  PLT 364 458* 458*  --  465* 976*   Basic Metabolic Panel: Recent Labs  Lab 11/01/17 0407 11/02/17 0314 11/03/17 0229 11/03/17 0747 11/04/17 0303  NA 139 138 138  --  138  K 3.6 3.7 3.0*  --  4.1  CL 106 103 106  --  105  CO2 25 23 24   --  24  GLUCOSE 128* 102* 181*  --  109*  BUN 9 <5* 7*  --  5*  CREATININE 0.63 0.52 0.68  --  0.62  CALCIUM 7.7* 7.5* 6.9*  --  7.3*  MG  --   --   --  1.6* 2.3   Liver Function Tests: Recent Labs  Lab 11/01/17 0407 11/03/17 0229  AST 65* 38  ALT 62* 31  ALKPHOS 85 91  BILITOT 0.4 0.1*  PROT 5.6* 5.2*  ALBUMIN 1.8* 1.6*      Radiology Studies: [place] Ekg Site Rite  Result Date: 11/07/2017 If Site Rite image not attached, placement could not be confirmed due to current cardiac rhythm.       Scheduled Meds: . amitriptyline  25 mg Oral QHS  . atorvastatin  20 mg Oral q1800  . busPIRone  7.5 mg Oral BID  . clopidogrel  75 mg Oral Daily  . dicyclomine  20 mg  Oral TID AC  . gabapentin  100 mg Oral TID  . heparin  5,000 Units Subcutaneous Q8H  . metoprolol tartrate  50 mg Oral BID  . oxyCODONE  15 mg Oral BID  . pantoprazole  40 mg Oral Daily  . sertraline  100 mg Oral Daily  . sodium chloride flush  3 mL Intravenous Q12H  . sucralfate  1 g Oral TID WC & HS  . traZODone  50 mg Oral QHS  . vitamin B-12  1,000 mcg Oral Daily   Continuous Infusions: . sodium chloride 250 mL (11/05/17 0906)  .  ceFAZolin (ANCEF) IV 2 g (11/07/17 1311)     LOS: 8 days     Vernell Leep, MD, FACP, [facility]. [facility] Pager [phone]  If 7PM-7AM, please contact night-coverage [email or web address] Password  [email or web address] 11/07/2017, 3:07 PM

## 2017-11-08 LAB — CBC
HCT: 31.3 % — ABNORMAL LOW (ref 36.0–46.0)
Hemoglobin: 9.4 g/dL — ABNORMAL LOW (ref 12.0–15.0)
MCH: 27.9 pg (ref 26.0–34.0)
MCHC: 30 g/dL (ref 30.0–36.0)
MCV: 92.9 fL (ref 78.0–100.0)
Platelets: 491 10*3/uL — ABNORMAL HIGH (ref 150–400)
RBC: 3.37 MIL/uL — ABNORMAL LOW (ref 3.87–5.11)
RDW: 15.8 % — ABNORMAL HIGH (ref 11.5–15.5)
WBC: 11.8 10*3/uL — ABNORMAL HIGH (ref 4.0–10.5)

## 2017-11-08 MED ORDER — SODIUM CHLORIDE 0.9% FLUSH
10.0000 mL | Freq: Two times a day (BID) | INTRAVENOUS | Status: DC
  Administered 2017-11-11: 10 mL

## 2017-11-08 MED ORDER — FERROUS SULFATE 325 (65 FE) MG PO TABS
325.0000 mg | ORAL_TABLET | Freq: Every day | ORAL | Status: DC
  Administered 2017-11-09 – 2017-11-11 (×3): 325 mg via ORAL
  Filled 2017-11-08 (×4): qty 1

## 2017-11-08 MED ORDER — LORAZEPAM 2 MG/ML IJ SOLN
1.0000 mg | Freq: Once | INTRAMUSCULAR | Status: AC
  Administered 2017-11-08: 1 mg via INTRAVENOUS
  Filled 2017-11-08: qty 1

## 2017-11-08 MED ORDER — SODIUM CHLORIDE 0.9% FLUSH: 10.0000 mL | INTRAVENOUS | Status: DC | PRN

## 2017-11-08 NOTE — Progress Notes (Signed)
Peripherally Inserted Central Catheter/Midline Placement  The IV Nurse has discussed with the patient and/or persons authorized to consent for the patient, the purpose of this procedure and the potential benefits and risks involved with this procedure.  The benefits include less needle sticks, lab draws from the catheter, and the patient may be discharged home with the catheter. Risks include, but not limited to, infection, bleeding, blood clot (thrombus formation), and puncture of an artery; nerve damage and irregular heartbeat and possibility to perform a PICC exchange if needed/ordered by physician.  Alternatives to this procedure were also discussed.  Bard Power PICC patient education guide, fact sheet on infection prevention and patient information card has been provided to patient /or left at bedside.    PICC/Midline Placement Documentation  PICC Single Lumen 00/45/99 PICC Left Basilic 39 cm 0 cm (Active)  Indication for Insertion or Continuance of Line Prolonged intravenous therapies 11/08/2017  1:29 PM  Exposed Catheter (cm) 0 cm 11/08/2017  1:29 PM  Site Assessment Clean;Dry;Intact 11/08/2017  1:29 PM  Line Status Flushed;Saline locked;Blood return noted 11/08/2017  1:29 PM  Dressing Type Transparent 11/08/2017  1:29 PM  Dressing Status Clean;Dry;Intact 11/08/2017  1:29 PM  Dressing Intervention New dressing 11/08/2017  1:29 PM  Dressing Change Due 11/15/17 11/08/2017  1:29 PM       Alee Katen, Nicolette Bang 11/08/2017, 1:30 PM

## 2017-11-08 NOTE — Progress Notes (Signed)
Pt family states that she will need a wheelchair no wider than 24 inches due to housing layout. Feels it would be best for pt to go to rehab center prior to coming home. Family is also concerned about pt being non weight bearing/ alone all day. Jerald Kief

## 2017-11-08 NOTE — Progress Notes (Signed)
[facility] for Infectious Disease  Date of Admission:  10/30/2017   Total days of antibiotics 12        Day 10 Cefazolin         ASSESSMENT: [name] is an 84 year old female with a septic right knee and thigh abscess complicated by MSSA bacteremia. Patient is on Day 12 of antibiotics (Day 10 cefazolin). Repeat blood cultures on 8/22 NGTD. 8/24 TTE did not show signs of vegetation. Per primary team note, TEE has been requested (cardiology schedule is full) and she may not get it until possibly 11/11/2017.  Duration of antibiotic therapy will be determined by TEE results.   PLAN: 1. MSSA Bacteremia - Continue Cefazolin - Follow-up TEE for evaluation of endocarditis. These results will determine the length of antibiotic treatment.  - PICC line to be placed today   Principal Problem:   Bacteremia due to methicillin susceptible Staphylococcus aureus (MSSA) Active Problems:   Occlusion of right femoral-popliteal bypass graft (HCC)   Septic arthritis of knee, right (HCC)   Abnormal transaminases   COPD (chronic obstructive pulmonary disease) (HCC)   Depression   Hypertension   History of pulmonary embolism   Chronic back pain   Normocytic anemia   History of bleeding peptic ulcer   Scheduled Meds: . amitriptyline  25 mg Oral QHS  . atorvastatin  20 mg Oral q1800  . busPIRone  7.5 mg Oral BID  . clopidogrel  75 mg Oral Daily  . dicyclomine  20 mg Oral TID AC  . gabapentin  100 mg Oral TID  . heparin  5,000 Units Subcutaneous Q8H  . metoprolol tartrate  50 mg Oral BID  . oxyCODONE  15 mg Oral BID  . pantoprazole  40 mg Oral Daily  . sertraline  100 mg Oral Daily  . sodium chloride flush  3 mL Intravenous Q12H  . sucralfate  1 g Oral TID WC & HS  . traZODone  50 mg Oral QHS  . vitamin B-12  1,000 mcg Oral Daily   Continuous Infusions: . sodium chloride 250 mL (11/05/17 0906)  .  ceFAZolin (ANCEF) IV 2 g (11/08/17 0537)   PRN Meds:.sodium chloride,  acetaminophen **OR** acetaminophen, albuterol, hydrALAZINE, morphine injection, ondansetron **OR** ondansetron (ZOFRAN) IV, oxyCODONE-acetaminophen, senna-docusate, sodium chloride flush   SUBJECTIVE: Patient does not report any acute complaints. She is concerned about having to be discharged "to a home" and wishes she could return home.    Review of Systems: Constitutional: Negative for chills, and fever.  CV: Negative for chest pain Resp: Negative for SOB, cough, congestion Gastrointestinal: Negative for abdominal pain, diarrhea, nausea and vomiting.  Musculoskeletal: Positive for joint pain (right knee>left).   Allergies  Allergen Reactions  . Aspirin Nausea And Vomiting  . Codeine Nausea And Vomiting  . Ibuprofen Nausea And Vomiting  . Penicillins Rash    Has patient had a PCN reaction causing immediate rash, facial/tongue/throat swelling, SOB or lightheadedness with hypotension: YES Has patient had a PCN reaction causing severe rash involving mucus membranes or skin necrosis: NO Has patient had a PCN reaction that required hospitalization: NO Has patient had a PCN reaction occurring within the last 10 years: YES If all of the above answers are "NO", then may proceed with Cephalosporin use.    OBJECTIVE: Vitals:   11/07/17 1409 11/08/17 0402 11/08/17 0432 11/08/17 0815  BP: (!) 122/52 (!) 124/47  (!) 108/41  Pulse: 76 74  76  Resp: 16 18    Temp: 98.3 F (36.8 C) 98.3 F (36.8 C)    TempSrc: Oral Oral    SpO2: 98% 97%    Weight:   68.5 kg   Height:       Body mass index is 26.75 kg/m.  Physical Exam Constitutional: She is oriented to person, place, and time.  She is resting quietly in bed.  Pulmonary/Chest: Effort normal Musculoskeletal:  Clean, dry incisions around her knee and lateral right thigh.  Skin: No rash noted.   Lab Results Lab Results  Component Value Date   WBC 11.8 (H) 11/08/2017   HGB 9.4 (L) 11/08/2017   HCT 31.3 (L) 11/08/2017   MCV 92.9  11/08/2017   PLT 491 (H) 11/08/2017    Lab Results  Component Value Date   CREATININE 0.62 11/04/2017   BUN 5 (L) 11/04/2017   NA 138 11/04/2017   K 4.1 11/04/2017   CL 105 11/04/2017   CO2 24 11/04/2017    Lab Results  Component Value Date   ALT 31 11/03/2017   AST 38 11/03/2017   ALKPHOS 91 11/03/2017   BILITOT 0.1 (L) 11/03/2017     Microbiology: Recent Results (from the past 240 hour(s))  Culture, blood (Routine X 2) w Reflex to ID Panel     Status: None (Preliminary result)   Collection Time: 11/04/17  4:30 PM  Result Value Ref Range Status   Specimen Description BLOOD RIGHT ANTECUBITAL  Final   Special Requests   Final    BOTTLES DRAWN AEROBIC AND ANAEROBIC Blood Culture adequate volume   Culture   Final    NO GROWTH 4 DAYS Performed at [facility], [place] [address], [place], [place] [address]    Report Status PENDING  Incomplete  Culture, blood (Routine X 2) w Reflex to ID Panel     Status: None (Preliminary result)   Collection Time: 11/04/17  4:45 PM  Result Value Ref Range Status   Specimen Description BLOOD RIGHT HAND  Final   Special Requests   Final    BOTTLES DRAWN AEROBIC AND ANAEROBIC Blood Culture results may not be optimal due to an inadequate volume of blood received in culture bottles   Culture   Final    NO GROWTH 4 DAYS Performed at [facility], [place] [address], [place], [place] [address]    Report Status PENDING  Incomplete    Carroll Sage, MD [phone] pager 11/08/2017, 11:17 AM

## 2017-11-08 NOTE — Progress Notes (Signed)
PROGRESS NOTE   Courtney Grant  GBT:[id number]    DOB: 1932/10/30    DOA: 10/30/2017  PCP: Garwin Brothers, MD   I have briefly reviewed patients previous medical records in [facility].  Brief Narrative:  84 y.o.femalewith pmhx ofCOPD, hypertension, history of PE with IVC filter not anticoagulated due to history of bleeding ulcer, peripheral arterial disease with history of right femoral-popliteal bypass graft, depression, chronic pain syndrome followed by outpatient pain MD, HLD, GERD, lung nodules, history of ORIF of right distal femur fracture complicated by nonhealing heel ulcer for which she underwent vascular bypass, remote history of proximal femur fractures status post closed reduction, internal fixation with IM nailing as well as proximal tibia and distal tibia fractures which were initially openly reduced and then finally closed reduced with nailing, who presented to her orthopedic surgeon's office (Dr Joya Salm, [facility])  for evaluation of increasing right knee pain and swelling.  A week prior to that visit she had received a steroid injection for history of degenerative arthritis of the knee by her primary care physician.  CT scan revealed a distal right thigh abscess that was suspected to be an implant infection and also showed femoral bypass graft occlusion. She had R knee aspirate that was purulent, and she was then admitted to [facility] on 10/28/2017.  She underwent right thigh/knee I&D and implant removal on 8/16. Initially treated with Vanco/Zosyn, however joint fluid culture and blood cultures grew pansensitive MSSA, patient was changed to IV Ancef on 10/30/2017.  Transferred to [facility] on 10/30/17 due to concerns about right fem-pop bypass graft occlusionand for vascular surgery evaluation.  Vascular surgery evaluated and plans no interventions at this time and plans to follow outpatient.  ID was consulted, recommended work-up for endocarditis.  TTE negative, TEE  requested (cardiology schedule is full) and may not get it until possibly 11/11/2017.  PICC line to be placed 8/26.  DC to SNF pending procedures and work-up.   Assessment & Plan:   Principal Problem:   Bacteremia due to methicillin susceptible Staphylococcus aureus (MSSA) Active Problems:   COPD (chronic obstructive pulmonary disease) (HCC)   Depression   Hypertension   History of pulmonary embolism   Occlusion of right femoral-popliteal bypass graft (HCC)   Chronic back pain   Normocytic anemia   Septic arthritis of knee, right (HCC)   History of bleeding peptic ulcer   Abnormal transaminases  Right knee septic arthritis, right distal thigh abscess with implant infection She had prior history of right hip and knee fracture status post nail, screw and femoral plate placement.  CT had shown right lateral thigh abscess concerning for hardware infection.  Knee aspiration prior to surgery was purulent and confirmed pansensitive MSSA. Now status post open right distal thigh abscess irrigation and debridement, right distal femur deep implant removal, right knee arthroscopic irrigation and debridement on 10/29/2017.   Hospitalist MD at [facility] had discussed with Dr. Bobby Rumpf, ID at [facility] who had initially recommended IV vancomycin and ceftriaxone which was then changed to cefazolin on 8/18 based on culture results.  As per orthopedics/Dr. Joya Salm nonweightbearing on right lower extremity x4-6 weeks minimum, daily PT for lower extremity strengthening and transfer on left lower extremity and monitor closely for recurrence of abscess or septic arthritis which may need orthopedic reevaluation and further surgery.  Current findings do not suggest recurrence and will continue to monitor closely. On 10/30/2017 ESR was over 140 and CRP was over  27, check ESR and CRP in 7 to 10 days to monitor response to treatment, ordered for 8/27.  Pain is better controlled and weaning off of IV  morphine.  ID/Dr. Michel Bickers input appreciated, follow-up on surveillance blood cultures 8/22 (no growth to date after 4 days) and TTE with normal LVEF and no mention regarding vegetations.  PICC line to be placed 8/26 under sedation (did not tolerate without sedation 8/25).  I discussed with Dr. Megan Salon, recommends TEE, cardiology consulted and may not be able to do it until 8/29.  Duration of antibiotics to be determined.  Upon discharge patient needs to follow-up with Dr. Joya Salm and +/- ID based on their recommendation.   MSSA bacteremia Has been on IV cefazolin since 8/18.  ID input as above.  PAD-- Right Fem-Pop bypass graft occlusionon imaging studies,  ???? Chronicity, ABI on 10/31/2017 shows moderate to severe reduction in the arterial flow with abnormal TBI's, Vascular surgery consult from Dr. Carlis Abbott appreciated.  Aortogram with bilateral lower extremity runoff 8/19 with occlusion of right SFA (Rt femoropopliteal bypass graft appears to have failed). Continue Lipitor and Plavix . Discussed with Dr. Carlis Abbott he would like patient to get antibiotic treatment for anxious etiology above for the next couple of weeks and VVS will follow-up with patient as outpatient in 2 to 3 weeks to determine when and if further vascular interventions need to be done.   Essential hypertension:  Controlled.  Continue metoprolol.  Continue PRN IV hydralazine.  Hyperlipidemia:  Continue atorvastatin.  Depression  stable, continue Zoloft 200 mg daily and buspirone 7.5 mg twice daily, trazodone and Elavil for sleep  Elevated LFTs---  Acute hepatitis profile was negative at outside facility.  Rright upper quadrant ultrasound >status post cholecystectomy, no biliary duct dilatation and no focal lesions identified. LFTs have normalized.  Does have significant hypoalbuminemia due to acute illness.  History of PE status post IVC filter:  Reportedly not on anticoagulation candidate due to history of  bleeding from peptic ulcers, IVC in situ.  Subacute on chronic anemia:  recent hemoglobin apparently usually 10. Suspect progressive recent anemia to be multifactorial: Recent blood loss during orthopedic surgery at [facility], acute illness related bone marrow suppression, daily phlebotomies, iron deficiency and possible complicating underlying chronic disease.  No overt bleeding.  FOBT negative.  Hemoglobin dropped to 7 on 8/22 and patient somewhat symptomatic with worsening dizziness.  Transfused 1 unit PRBC.  Anemia panel appreciated, iron deficiency.  Oral iron supplements. Hemoglobin improved and stable in the 9 g range.  Hypomagnesemia and hypokalemia:  Replaced.  COPD:  Stable without clinical bronchospasm.  GERD PPI  Chronic Pain Mx as above.  Reasonably controlled.   DVT prophylaxis: Heparin Code Status: Full Family Communication: None at bedside today. Disposition: DC to SNF pending completion of ID work-up including TTE.   Consultants:  Vascular surgery Infectious disease Cardiology for TTE.  Procedures:  PICC line to be placed 8/26.  Antimicrobials:  IV cefazolin   Subjective: Discussed with PICC team RN yesterday, patient unable to cooperate for PICC line without sedation.  Patient willing to have it today under sedation.  No new complaints reported.  Right lower extremity pain mild and controlled.  ROS: As above, otherwise negative.  No melena or bleeding reported.  Objective:  Vitals:   11/07/17 1409 11/08/17 0402 11/08/17 0432 11/08/17 0815  BP: (!) 122/52 (!) 124/47  (!) 108/41  Pulse: 76 74  76  Resp: 16 18    Temp:  98.3 F (36.8 C) 98.3 F (36.8 C)    TempSrc: Oral Oral    SpO2: 98% 97%    Weight:   68.5 kg   Height:        Examination:   General exam: Pleasant elderly female, moderately built and nourished, lying comfortably propped up in bed without distress.  Stable Respiratory system: Clear to auscultation without wheezing,  rhonchi or crackles.  No increased work of breathing. Cardiovascular system: S1 and S2 heard, RRR.  No JVD, murmurs.  Trace bilateral ankle edema. Gastrointestinal system: Abdomen is nondistended, soft and nontender. No organomegaly or masses felt. Normal bowel sounds heard.  Stable. Central nervous system: Alert and oriented. No focal neurological deficits.  Has chronic speech abnormality.  Stable. Extremities: Symmetric 5 x 5 power.  Right lower extremity surgical scar over right lateral thigh and over the knee healing well and no acute findings noted. Skin: No rashes, lesions or ulcers Psychiatry: Judgement and insight chronically somewhat impaired. Mood & affect appropriate.     Data Reviewed: I have personally reviewed following labs and imaging studies  CBC: Recent Labs  Lab 11/03/17 1947 11/04/17 0303 11/04/17 1644 11/05/17 0737 11/06/17 0804 11/08/17 0342  WBC 13.0* 13.7*  --  13.1* 15.1* 11.8*  HGB 7.3* 7.0* 9.0* 9.4* 9.4* 9.4*  HCT 24.1* 23.4* 28.4* 30.7* 30.8* 31.3*  MCV 91.3 91.8  --  93.0 92.5 92.9  PLT 458* 458*  --  465* 502* 951*   Basic Metabolic Panel: Recent Labs  Lab 11/02/17 0314 11/03/17 0229 11/03/17 0747 11/04/17 0303  NA 138 138  --  138  K 3.7 3.0*  --  4.1  CL 103 106  --  105  CO2 23 24  --  24  GLUCOSE 102* 181*  --  109*  BUN <5* 7*  --  5*  CREATININE 0.52 0.68  --  0.62  CALCIUM 7.5* 6.9*  --  7.3*  MG  --   --  1.6* 2.3   Liver Function Tests: Recent Labs  Lab 11/03/17 0229  AST 38  ALT 31  ALKPHOS 91  BILITOT 0.1*  PROT 5.2*  ALBUMIN 1.6*      Radiology Studies: [place] Ekg Site Rite  Result Date: 11/07/2017 If Site Rite image not attached, placement could not be confirmed due to current cardiac rhythm.       Scheduled Meds: . amitriptyline  25 mg Oral QHS  . atorvastatin  20 mg Oral q1800  . busPIRone  7.5 mg Oral BID  . clopidogrel  75 mg Oral Daily  . dicyclomine  20 mg Oral TID AC  . gabapentin  100 mg Oral TID   . heparin  5,000 Units Subcutaneous Q8H  . LORazepam  1 mg Intravenous Once  . metoprolol tartrate  50 mg Oral BID  . oxyCODONE  15 mg Oral BID  . pantoprazole  40 mg Oral Daily  . sertraline  100 mg Oral Daily  . sodium chloride flush  3 mL Intravenous Q12H  . sucralfate  1 g Oral TID WC & HS  . traZODone  50 mg Oral QHS  . vitamin B-12  1,000 mcg Oral Daily   Continuous Infusions: . sodium chloride 250 mL (11/05/17 0906)  .  ceFAZolin (ANCEF) IV 2 g (11/08/17 0537)     LOS: 9 days     Vernell Leep, MD, FACP, [facility] Pager [phone]  If 7PM-7AM, please contact night-coverage [email or web address] Password [email or web address] 11/08/2017, 11:48 AM

## 2017-11-08 NOTE — Progress Notes (Signed)
Physical Therapy Treatment Patient Details Name: Courtney Grant MRN: [id number] DOB: 1932/09/29 Today's Date: 11/08/2017    History of Present Illness Pt adm to [facility] on 8/15 with rt septic knee, rt thigh abscess, and likely infected hardware. Pt also with occluded rt fem-pop bypass graft. On 10/29/17 at [facility] pt underwent I&D of rt knee/thigh and removal of plate and screws from previous ORIF. Pt transferred to [facility] on 8/17 for vascular consult concerning occluded bypass graft. PMH - rt femur fx x 2,  COPD, hypertension, history of PE with IVC filter not anticoagulated due to history of bleeding ulcer, peripheral arterial disease with history of right femoral-pop bypass, depression.    PT Comments    Pt progressing slowly with mobility. From an observational standpoint pt's pain appears improved since last week. Transfer difficult today due to pt had Ativan (for PICC placement) prior to being seen.   Follow Up Recommendations  SNF     Equipment Recommendations  Wheelchair (measurements PT);Wheelchair cushion (measurements PT)    Recommendations for Other Services       Precautions / Restrictions Precautions Precautions: Fall Restrictions Weight Bearing Restrictions: Yes RLE Weight Bearing: Non weight bearing Other Position/Activity Restrictions: x 4 weeks per ortho at [place] bed mobility: Needs Assistance Bed Mobility: Supine to Sit     Supine to sit: HOB elevated;Min guard     General bed mobility comments: Incr time and effort  Transfers Overall transfer level: Needs assistance Equipment used: None Transfers: Sit to/from [employer] Sit to Stand: Mod assist   Squat pivot transfers: +2 physical assistance;Mod assist     General transfer comment: Assist to bring hips and trunk up into stand. 2 person assist to perform pivot bed to bsc to recliner due to pt having difficulty following instructions due  to Ativan. Unable to maintain NWB status  Ambulation/Gait             General Gait Details: unable due to pain/NWB status   Stairs             Wheelchair Mobility    Modified Rankin (Stroke Patients Only)       Balance Overall balance assessment: Needs assistance Sitting-balance support: Feet supported;No upper extremity supported Sitting balance-Leahy Scale: Good Sitting balance - Comments: static                                    Cognition Arousal/Alertness: Awake/alert Behavior During Therapy: WFL for tasks assessed/performed Overall Cognitive Status: Impaired/Different from baseline Area of Impairment: Problem solving;Attention;Safety/judgement                   Current Attention Level: Sustained     Safety/Judgement: Decreased awareness of safety;Decreased awareness of deficits   Problem Solving: Difficulty sequencing;Requires verbal cues;Requires tactile cues General Comments: Pt had received Ativan to have PICC placed prior to being seen by PT      Exercises      General Comments        Pertinent Vitals/Pain Pain Assessment: Faces Faces Pain Scale: Hurts little more Pain Location: R knee Pain Descriptors / Indicators: Grimacing;Guarding Pain Intervention(s): Limited activity within patient's tolerance;Monitored during session;Repositioned    Home Living                      Prior Function  PT Goals (current goals can now be found in the care plan section) Progress towards PT goals: Progressing toward goals    Frequency    Min 2X/week      PT Plan Current plan remains appropriate;Frequency needs to be updated    Co-evaluation              AM-PAC PT "6 Clicks" Daily Activity  Outcome Measure  Difficulty turning over in bed (including adjusting bedclothes, sheets and blankets)?: A Lot Difficulty moving from lying on back to sitting on the side of the bed? : A Lot Difficulty  sitting down on and standing up from a chair with arms (e.g., wheelchair, bedside commode, etc,.)?: Unable Help needed moving to and from a bed to chair (including a wheelchair)?: Total Help needed walking in hospital room?: Total Help needed climbing 3-5 steps with a railing? : Total 6 Click Score: 8    End of Session Equipment Utilized During Treatment: Gait belt Activity Tolerance: Patient tolerated treatment well Patient left: with call bell/phone within reach;in chair;with chair alarm set Nurse Communication: Mobility status(Nurse tech present for treatment) PT Visit Diagnosis: Other abnormalities of gait and mobility (R26.89);Pain Pain - Right/Left: Right Pain - part of body: Knee     Time: 8101-7510 PT Time Calculation (min) (ACUTE ONLY): 26 min  Charges:  $Therapeutic Activity: 23-37 mins                     [facility] PT [place] 11/08/2017, 4:48 PM

## 2017-11-09 ENCOUNTER — Ambulatory Visit: Payer: Medicare Other | Admitting: Vascular Surgery

## 2017-11-09 LAB — CULTURE, BLOOD (ROUTINE X 2)
Culture: NO GROWTH
Culture: NO GROWTH
Special Requests: ADEQUATE

## 2017-11-09 MED ORDER — MORPHINE SULFATE (PF) 2 MG/ML IV SOLN: 1.0000 mg | Freq: Four times a day (QID) | INTRAVENOUS | Status: DC | PRN

## 2017-11-09 NOTE — Care Management Important Message (Signed)
Important Message  Patient Details  Name: Courtney Grant MRN: [id number] Date of Birth: 1933/02/19   Medicare Important Message Given:  Yes    Anant Agard P [facility] 11/09/2017, 1:06 PM

## 2017-11-09 NOTE — Progress Notes (Signed)
Called cardiology, patient is on for TEE tomorrow afternoon.  They will come by later today to discuss the procedure.    Kelly Splinter MD [facility] pgr [phone] 08/08/2017, 9:25 AM

## 2017-11-09 NOTE — Progress Notes (Signed)
Clinical Social Worker following pateint for support and dishcrage needs. At this time patient has a bed at a [facility] ([place] [place] [address] [place]) and insurance has been attained. CSW will continue to follow patient until medically cleared to discharge to rehab.   Rhea Pink, MSW,  [place]

## 2017-11-09 NOTE — Progress Notes (Addendum)
PROGRESS NOTE   Courtney Grant  KCL:[id number]    DOB: 1932/06/27    DOA: 10/30/2017  PCP: Garwin Brothers, MD   I have briefly reviewed patients previous medical records in [facility].  Brief Narrative:  84 y.o.femalewith pmhx ofCOPD, hypertension, history of PE with IVC filter not anticoagulated due to history of bleeding ulcer, peripheral arterial disease with history of right femoral-popliteal bypass graft, depression, chronic pain syndrome followed by outpatient pain MD, HLD, GERD, lung nodules, history of ORIF of right distal femur fracture complicated by nonhealing heel ulcer for which she underwent vascular bypass, remote history of proximal femur fractures status post closed reduction, internal fixation with IM nailing as well as proximal tibia and distal tibia fractures which were initially openly reduced and then finally closed reduced with nailing, who presented to her orthopedic surgeon's office (Dr Joya Salm, [place])  for evaluation of increasing right knee pain and swelling.  A week prior to that visit she had received a steroid injection for history of degenerative arthritis of the knee by her primary care physician.  CT scan revealed a distal right thigh abscess that was suspected to be an implant infection and also showed femoral bypass graft occlusion. She had R knee aspirate that was purulent, and she was then admitted to [facility] on 10/28/2017.  She underwent right thigh/knee I&D and implant removal on 8/16. Initially treated with Vanco/Zosyn, however joint fluid culture and blood cultures grew pansensitive MSSA, patient was changed to IV Ancef on 10/30/2017.  Transferred to [facility] on 10/30/17 due to concerns about right fem-pop bypass graft occlusionand for vascular surgery evaluation.  Vascular surgery evaluated and plans no interventions at this time and plans to follow outpatient.  ID was consulted, recommended work-up for endocarditis.  TTE negative, TEE  requested (cardiology schedule is full) and may not get it until possibly 11/11/2017.  PICC line to be placed 8/26.  DC to SNF pending procedures and work-up.   Assessment & Plan:   Principal Problem:   Bacteremia due to methicillin susceptible Staphylococcus aureus (MSSA) Active Problems:   COPD (chronic obstructive pulmonary disease) (HCC)   Depression   Hypertension   History of pulmonary embolism   Occlusion of right femoral-popliteal bypass graft (HCC)   Chronic back pain   Normocytic anemia   Septic arthritis of knee, right (HCC)   History of bleeding peptic ulcer   Abnormal transaminases  Right knee septic arthritis, right distal thigh abscess with implant infection: prior history of right hip and knee fracture status post nail, screw and femoral plate placement.  Pt was seen by her orthopedic surgeon on 10/28/17 for R knee pain who did a knee aspirate that was grossly purulent so patient was admitted to [facility] that day. CT scan showed right lateral thigh abscess concerning for hardware infection.  Knee aspirate cx's grew pansensitive MSSA. She underwent open right distal thigh abscess irrigation and debridement, right distal femur deep implant removal, right knee arthroscopic irrigation and debridement on 10/29/2017 at [place].   Hospitalist MD at [facility] had discussed with Dr. Bobby Rumpf, ID at [facility] who had initially recommended IV vancomycin and ceftriaxone which was then changed to cefazolin on 8/18 based on culture results.  There was no vascular surg coverage at [facility] to patient was transferred to [facility] on 10/30/17. As per orthopedics from [place] Dr. Joya Salm nonweightbearing on right lower extremity x4-6 weeks minimum, daily PT for lower extremity strengthening and  transfer on left lower extremity and monitor closely for recurrence of abscess or septic arthritis which may need orthopedic reevaluation and further surgery.  Current  findings do not suggest recurrence and will continue to monitor closely - 10/30/2017 ESR was over 140 and CRP was over 27 >> check ESR and CRP in 7 to 10 days to monitor response to treatment, ordered for 8/27 today - pain is better controlled and weaning off of IV morphine >  reduced to 73m every 6 hrs today -  ID input appreciated, follow-up blood cultures 8/22 are neg to date, TTE with no mention regarding vegetations.  Dr. CMegan Salonrecommends TEE, cardiology consulted and may not be able to do it until 8/29 - Duration of antibiotics to be determined  - PICC line placed 8/26 under sedation (did not tolerate without sedation 8/25) - Upon discharge patient needs to follow-up with Dr. JJoya Salmand +/- ID based on their recommendation.   MSSA bacteremia Has been on IV cefazolin since 8/18.  ID input as above.  PAD-- Right Fem-Pop bypass graft occlusionon imaging studies,  ABI on 10/31/2017 showed moderate to severe reduction in the arterial flow with abnormal TBI's, Vascular surgery consult from Dr. CCarlis Abbottappreciated.  Aortogram with bilateral lower extremity runoff 8/19 with occlusion of right SFA (Rt femoropopliteal bypass graft appears to have failed). Continue Lipitor and Plavix . Per [facility] / Dr CCarlis Abbott- no saphenous vein available to re-do bypass, will need prosthetic graft or cryo-vein, high risk to tunnel through/ across the infected thigh and knee at this time, and as pt is minimally symptomatic from the occluded graft, [facility] would prefer that infection be fully treated and they will f/u at a later date (2-3 wks) to make plans.  Essential hypertension:  Controlled.  Continue metoprolol 50 bid  Elevated LFTs--- resolved Acute hepatitis profile was negative at outside facility.  Right upper quadrant ultrasound >status post cholecystectomy, no biliary duct dilatation and no focal lesions identified.   Chronic Pain/ anxiety/ depression - has chronic pain (diffuse, f/b pain clinic) was on  oxycontin 10 tid at home, is on 15 mg bid here, continue - weaning IV MSO4 - percocet 7.5 two tabs every 6 hrs prn/ neurontin 100 tid - continue zoloft + buspar, and elavil+trazodone at night for sleep   History of PE status post IVC filter:  Reportedly not on anticoagulation candidate due to history of bleeding from peptic ulcers, IVC in situ.  Subacute on chronic anemia:  recent hemoglobin apparently usually 10. Suspect progressive recent anemia to be multifactorial: [facility] related bone marrow suppression, daily phlebotomies, iron deficiency and possible complicating underlying chronic disease.  No overt bleeding.  FOBT negative.  Hemoglobin dropped to 7 on 8/22 and patient somewhat symptomatic with worsening dizziness.  Transfused 1 unit PRBC.  Anemia panel appreciated, iron deficiency.  Oral iron supplements. Hemoglobin improved and stable in the 9 g range.  Hypomagnesemia and hypokalemia:  Replaced.   Hyperlipidemia:  Continue atorvastatin.  COPD:  Stable without clinical bronchospasm.  GERD PPI   DVT prophylaxis: Heparin Code Status: Full Family Communication: None at bedside today. Disposition: DC to SNF pending TEE completion and final ID rec's  RKelly SplinterMD [facility] pgr ([phone]/26/2019, 9:25 AM      Consultants:  Vascular surgery Infectious disease Cardiology for TTE.  Procedures:  PICC line placed 8/26.  Antimicrobials:  IV cefazolin   Subjective: No new  c/o's today, RLE hurting off and on.  Disappointed at her failing health and acute/ chronic pain.  Not sig interested in discussing EOL issues/ pall care, wants full code, etc for now.     ROS: As above, otherwise negative.  No melena or bleeding reported.  Objective:  Vitals:   11/08/17 0815 11/08/17 2007 11/09/17 0523 11/09/17 0527  BP: (!) 108/41 (!) 144/69  (!) 134/58  Pulse: 76 95      Resp:      Temp:  99.8 F (37.7 C)  98.7 F (37.1 C)  TempSrc:  Oral  Oral  SpO2:  100%    Weight:   67.1 kg   Height:        Examination:   General exam: Pleasant elderly female, moderately built and nourished, lying comfortably propped up in bed without distress.  Stable Respiratory system: Clear to auscultation without wheezing, rhonchi or crackles.  No increased work of breathing. Cardiovascular system: S1 and S2 heard, RRR.  No JVD, murmurs.  Trace bilateral ankle edema. Gastrointestinal system: Abdomen is nondistended, soft and nontender. No organomegaly or masses felt. Normal bowel sounds heard.  Stable. Central nervous system: Alert and oriented. No focal neurological deficits.  Has chronic speech abnormality.  Stable. Extremities: Symmetric 5 x 5 power.  Right lower extremity surgical scar over right lateral thigh and over the knee healing well and no acute findings noted. Skin: No rashes, lesions or ulcers Psychiatry: Judgement and insight chronically somewhat impaired. Mood & affect appropriate.     Data Reviewed: I have personally reviewed following labs and imaging studies  CBC: Recent Labs  Lab 11/03/17 1947 11/04/17 0303 11/04/17 1644 11/05/17 0737 11/06/17 0804 11/08/17 0342  WBC 13.0* 13.7*  --  13.1* 15.1* 11.8*  HGB 7.3* 7.0* 9.0* 9.4* 9.4* 9.4*  HCT 24.1* 23.4* 28.4* 30.7* 30.8* 31.3*  MCV 91.3 91.8  --  93.0 92.5 92.9  PLT 458* 458*  --  465* 502* 947*   Basic Metabolic Panel: Recent Labs  Lab 11/03/17 0229 11/03/17 0747 11/04/17 0303  NA 138  --  138  K 3.0*  --  4.1  CL 106  --  105  CO2 24  --  24  GLUCOSE 181*  --  109*  BUN 7*  --  5*  CREATININE 0.68  --  0.62  CALCIUM 6.9*  --  7.3*  MG  --  1.6* 2.3   Liver Function Tests: Recent Labs  Lab 11/03/17 0229  AST 38  ALT 31  ALKPHOS 91  BILITOT 0.1*  PROT 5.2*  ALBUMIN 1.6*      Radiology Studies: [place] Ekg Site Rite  Result Date: 11/07/2017 If Site Rite image not  attached, placement could not be confirmed due to current cardiac rhythm.       Scheduled Meds: . amitriptyline  25 mg Oral QHS  . atorvastatin  20 mg Oral q1800  . busPIRone  7.5 mg Oral BID  . clopidogrel  75 mg Oral Daily  . dicyclomine  20 mg Oral TID AC  . ferrous sulfate  325 mg Oral Q breakfast  . gabapentin  100 mg Oral TID  . heparin  5,000 Units Subcutaneous Q8H  . metoprolol tartrate  50 mg Oral BID  . oxyCODONE  15 mg Oral BID  . pantoprazole  40 mg Oral Daily  . sertraline  100 mg Oral Daily  . sodium chloride flush  10-40 mL Intracatheter Q12H  . sodium chloride flush  3 mL Intravenous Q12H  . sucralfate  1 g Oral TID WC & HS  . traZODone  50 mg Oral QHS  . vitamin B-12  1,000 mcg Oral Daily   Continuous Infusions: . sodium chloride 10 mL/hr at 11/08/17 2000  .  ceFAZolin (ANCEF) IV 2 g (11/09/17 0521)     LOS: 10 days    If 7PM-7AM, please contact night-coverage [email or web address] Password [facility] 11/09/2017, 9:03 AM

## 2017-11-09 NOTE — H&P (View-Only) (Signed)
PROGRESS NOTE   Courtney Grant  KCL:[id number]    DOB: 1932/06/27    DOA: 10/30/2017  PCP: Garwin Brothers, MD   I have briefly reviewed patients previous medical records in [facility].  Brief Narrative:  84 y.o.femalewith pmhx ofCOPD, hypertension, history of PE with IVC filter not anticoagulated due to history of bleeding ulcer, peripheral arterial disease with history of right femoral-popliteal bypass graft, depression, chronic pain syndrome followed by outpatient pain MD, HLD, GERD, lung nodules, history of ORIF of right distal femur fracture complicated by nonhealing heel ulcer for which she underwent vascular bypass, remote history of proximal femur fractures status post closed reduction, internal fixation with IM nailing as well as proximal tibia and distal tibia fractures which were initially openly reduced and then finally closed reduced with nailing, who presented to her orthopedic surgeon's office (Dr Joya Salm, [place])  for evaluation of increasing right knee pain and swelling.  A week prior to that visit she had received a steroid injection for history of degenerative arthritis of the knee by her primary care physician.  CT scan revealed a distal right thigh abscess that was suspected to be an implant infection and also showed femoral bypass graft occlusion. She had R knee aspirate that was purulent, and she was then admitted to [facility] on 10/28/2017.  She underwent right thigh/knee I&D and implant removal on 8/16. Initially treated with Vanco/Zosyn, however joint fluid culture and blood cultures grew pansensitive MSSA, patient was changed to IV Ancef on 10/30/2017.  Transferred to [facility] on 10/30/17 due to concerns about right fem-pop bypass graft occlusionand for vascular surgery evaluation.  Vascular surgery evaluated and plans no interventions at this time and plans to follow outpatient.  ID was consulted, recommended work-up for endocarditis.  TTE negative, TEE  requested (cardiology schedule is full) and may not get it until possibly 11/11/2017.  PICC line to be placed 8/26.  DC to SNF pending procedures and work-up.   Assessment & Plan:   Principal Problem:   Bacteremia due to methicillin susceptible Staphylococcus aureus (MSSA) Active Problems:   COPD (chronic obstructive pulmonary disease) (HCC)   Depression   Hypertension   History of pulmonary embolism   Occlusion of right femoral-popliteal bypass graft (HCC)   Chronic back pain   Normocytic anemia   Septic arthritis of knee, right (HCC)   History of bleeding peptic ulcer   Abnormal transaminases  Right knee septic arthritis, right distal thigh abscess with implant infection: prior history of right hip and knee fracture status post nail, screw and femoral plate placement.  Pt was seen by her orthopedic surgeon on 10/28/17 for R knee pain who did a knee aspirate that was grossly purulent so patient was admitted to [facility] that day. CT scan showed right lateral thigh abscess concerning for hardware infection.  Knee aspirate cx's grew pansensitive MSSA. She underwent open right distal thigh abscess irrigation and debridement, right distal femur deep implant removal, right knee arthroscopic irrigation and debridement on 10/29/2017 at [place].   Hospitalist MD at [facility] had discussed with Dr. Bobby Rumpf, ID at [facility] who had initially recommended IV vancomycin and ceftriaxone which was then changed to cefazolin on 8/18 based on culture results.  There was no vascular surg coverage at [facility] to patient was transferred to [facility] on 10/30/17. As per orthopedics from [place] Dr. Joya Salm nonweightbearing on right lower extremity x4-6 weeks minimum, daily PT for lower extremity strengthening and  transfer on left lower extremity and monitor closely for recurrence of abscess or septic arthritis which may need orthopedic reevaluation and further surgery.  Current  findings do not suggest recurrence and will continue to monitor closely - 10/30/2017 ESR was over 140 and CRP was over 27 >> check ESR and CRP in 7 to 10 days to monitor response to treatment, ordered for 8/27 today - pain is better controlled and weaning off of IV morphine >  reduced to 73m every 6 hrs today -  ID input appreciated, follow-up blood cultures 8/22 are neg to date, TTE with no mention regarding vegetations.  Dr. CMegan Salonrecommends TEE, cardiology consulted and may not be able to do it until 8/29 - Duration of antibiotics to be determined  - PICC line placed 8/26 under sedation (did not tolerate without sedation 8/25) - Upon discharge patient needs to follow-up with Dr. JJoya Salmand +/- ID based on their recommendation.   MSSA bacteremia Has been on IV cefazolin since 8/18.  ID input as above.  PAD-- Right Fem-Pop bypass graft occlusionon imaging studies,  ABI on 10/31/2017 showed moderate to severe reduction in the arterial flow with abnormal TBI's, Vascular surgery consult from Dr. CCarlis Abbottappreciated.  Aortogram with bilateral lower extremity runoff 8/19 with occlusion of right SFA (Rt femoropopliteal bypass graft appears to have failed). Continue Lipitor and Plavix . Per [facility] / Dr CCarlis Abbott- no saphenous vein available to re-do bypass, will need prosthetic graft or cryo-vein, high risk to tunnel through/ across the infected thigh and knee at this time, and as pt is minimally symptomatic from the occluded graft, [facility] would prefer that infection be fully treated and they will f/u at a later date (2-3 wks) to make plans.  Essential hypertension:  Controlled.  Continue metoprolol 50 bid  Elevated LFTs--- resolved Acute hepatitis profile was negative at outside facility.  Right upper quadrant ultrasound >status post cholecystectomy, no biliary duct dilatation and no focal lesions identified.   Chronic Pain/ anxiety/ depression - has chronic pain (diffuse, f/b pain clinic) was on  oxycontin 10 tid at home, is on 15 mg bid here, continue - weaning IV MSO4 - percocet 7.5 two tabs every 6 hrs prn/ neurontin 100 tid - continue zoloft + buspar, and elavil+trazodone at night for sleep   History of PE status post IVC filter:  Reportedly not on anticoagulation candidate due to history of bleeding from peptic ulcers, IVC in situ.  Subacute on chronic anemia:  recent hemoglobin apparently usually 10. Suspect progressive recent anemia to be multifactorial: [facility] related bone marrow suppression, daily phlebotomies, iron deficiency and possible complicating underlying chronic disease.  No overt bleeding.  FOBT negative.  Hemoglobin dropped to 7 on 8/22 and patient somewhat symptomatic with worsening dizziness.  Transfused 1 unit PRBC.  Anemia panel appreciated, iron deficiency.  Oral iron supplements. Hemoglobin improved and stable in the 9 g range.  Hypomagnesemia and hypokalemia:  Replaced.   Hyperlipidemia:  Continue atorvastatin.  COPD:  Stable without clinical bronchospasm.  GERD PPI   DVT prophylaxis: Heparin Code Status: Full Family Communication: None at bedside today. Disposition: DC to SNF pending TEE completion and final ID rec's  RKelly SplinterMD [facility] pgr ([phone]/26/2019, 9:25 AM      Consultants:  Vascular surgery Infectious disease Cardiology for TTE.  Procedures:  PICC line placed 8/26.  Antimicrobials:  IV cefazolin   Subjective: No new  c/o's today, RLE hurting off and on.  Disappointed at her failing health and acute/ chronic pain.  Not sig interested in discussing EOL issues/ pall care, wants full code, etc for now.     ROS: As above, otherwise negative.  No melena or bleeding reported.  Objective:  Vitals:   11/08/17 0815 11/08/17 2007 11/09/17 0523 11/09/17 0527  BP: (!) 108/41 (!) 144/69  (!) 134/58  Pulse: 76 95      Resp:      Temp:  99.8 F (37.7 C)  98.7 F (37.1 C)  TempSrc:  Oral  Oral  SpO2:  100%    Weight:   67.1 kg   Height:        Examination:   General exam: Pleasant elderly female, moderately built and nourished, lying comfortably propped up in bed without distress.  Stable Respiratory system: Clear to auscultation without wheezing, rhonchi or crackles.  No increased work of breathing. Cardiovascular system: S1 and S2 heard, RRR.  No JVD, murmurs.  Trace bilateral ankle edema. Gastrointestinal system: Abdomen is nondistended, soft and nontender. No organomegaly or masses felt. Normal bowel sounds heard.  Stable. Central nervous system: Alert and oriented. No focal neurological deficits.  Has chronic speech abnormality.  Stable. Extremities: Symmetric 5 x 5 power.  Right lower extremity surgical scar over right lateral thigh and over the knee healing well and no acute findings noted. Skin: No rashes, lesions or ulcers Psychiatry: Judgement and insight chronically somewhat impaired. Mood & affect appropriate.     Data Reviewed: I have personally reviewed following labs and imaging studies  CBC: Recent Labs  Lab 11/03/17 1947 11/04/17 0303 11/04/17 1644 11/05/17 0737 11/06/17 0804 11/08/17 0342  WBC 13.0* 13.7*  --  13.1* 15.1* 11.8*  HGB 7.3* 7.0* 9.0* 9.4* 9.4* 9.4*  HCT 24.1* 23.4* 28.4* 30.7* 30.8* 31.3*  MCV 91.3 91.8  --  93.0 92.5 92.9  PLT 458* 458*  --  465* 502* 947*   Basic Metabolic Panel: Recent Labs  Lab 11/03/17 0229 11/03/17 0747 11/04/17 0303  NA 138  --  138  K 3.0*  --  4.1  CL 106  --  105  CO2 24  --  24  GLUCOSE 181*  --  109*  BUN 7*  --  5*  CREATININE 0.68  --  0.62  CALCIUM 6.9*  --  7.3*  MG  --  1.6* 2.3   Liver Function Tests: Recent Labs  Lab 11/03/17 0229  AST 38  ALT 31  ALKPHOS 91  BILITOT 0.1*  PROT 5.2*  ALBUMIN 1.6*      Radiology Studies: [place] Ekg Site Rite  Result Date: 11/07/2017 If Site Rite image not  attached, placement could not be confirmed due to current cardiac rhythm.       Scheduled Meds: . amitriptyline  25 mg Oral QHS  . atorvastatin  20 mg Oral q1800  . busPIRone  7.5 mg Oral BID  . clopidogrel  75 mg Oral Daily  . dicyclomine  20 mg Oral TID AC  . ferrous sulfate  325 mg Oral Q breakfast  . gabapentin  100 mg Oral TID  . heparin  5,000 Units Subcutaneous Q8H  . metoprolol tartrate  50 mg Oral BID  . oxyCODONE  15 mg Oral BID  . pantoprazole  40 mg Oral Daily  . sertraline  100 mg Oral Daily  . sodium chloride flush  10-40 mL Intracatheter Q12H  . sodium chloride flush  3 mL Intravenous Q12H  . sucralfate  1 g Oral TID WC & HS  . traZODone  50 mg Oral QHS  . vitamin B-12  1,000 mcg Oral Daily   Continuous Infusions: . sodium chloride 10 mL/hr at 11/08/17 2000  .  ceFAZolin (ANCEF) IV 2 g (11/09/17 0521)     LOS: 10 days    If 7PM-7AM, please contact night-coverage [email or web address] Password [facility] 11/09/2017, 9:03 AM

## 2017-11-09 NOTE — Progress Notes (Signed)
    [facility] has been requested to perform a transesophageal echocardiogram on 11/10/17 for Bacteremia.  After careful review of history and examination, the risks and benefits of transesophageal echocardiogram have been explained including risks of esophageal damage, perforation (1:10,000 risk), bleeding, pharyngeal hematoma as well as other potential complications associated with conscious sedation including aspiration, arrhythmia, respiratory failure and death. Alternatives to treatment were discussed, questions were answered. Patient is willing to proceed. Labs and vital signs.   Reino Bellis, NP-C 11/09/2017 5:03 PM

## 2017-11-10 ENCOUNTER — Encounter (HOSPITAL_COMMUNITY)
Admission: AD | Disposition: A | Discharge status: Skilled Nursing Facility | Payer: Self-pay | Attending: Internal Medicine

## 2017-11-10 ENCOUNTER — Encounter (HOSPITAL_COMMUNITY): Payer: Self-pay | Admitting: *Deleted

## 2017-11-10 ENCOUNTER — Inpatient Hospital Stay (HOSPITAL_COMMUNITY): Payer: Medicare Other

## 2017-11-10 DIAGNOSIS — T82898A Other specified complication of vascular prosthetic devices, implants and grafts, initial encounter: Principal | ICD-10-CM

## 2017-11-10 DIAGNOSIS — I340 Nonrheumatic mitral (valve) insufficiency: Secondary | ICD-10-CM

## 2017-11-10 HISTORY — PX: TEE WITHOUT CARDIOVERSION: SHX5443

## 2017-11-10 SURGERY — ECHOCARDIOGRAM, TRANSESOPHAGEAL
Anesthesia: Moderate Sedation

## 2017-11-10 MED ORDER — BUTAMBEN-TETRACAINE-BENZOCAINE 2-2-14 % EX AERO
INHALATION_SPRAY | CUTANEOUS | Status: DC | PRN
  Administered 2017-11-10: 2 via TOPICAL

## 2017-11-10 MED ORDER — FENTANYL CITRATE (PF) 100 MCG/2ML IJ SOLN
INTRAMUSCULAR | Status: AC
  Filled 2017-11-10: qty 2

## 2017-11-10 MED ORDER — DIPHENHYDRAMINE HCL 50 MG/ML IJ SOLN
INTRAMUSCULAR | Status: AC
  Filled 2017-11-10: qty 1

## 2017-11-10 MED ORDER — SODIUM CHLORIDE 0.9 % IV SOLN
INTRAVENOUS | Status: DC
  Administered 2017-11-10: 13:00:00 via INTRAVENOUS

## 2017-11-10 MED ORDER — MIDAZOLAM HCL 5 MG/ML IJ SOLN
INTRAMUSCULAR | Status: AC
  Filled 2017-11-10: qty 2

## 2017-11-10 MED ORDER — FENTANYL CITRATE (PF) 100 MCG/2ML IJ SOLN
INTRAMUSCULAR | Status: DC | PRN
  Administered 2017-11-10 (×3): 25 ug via INTRAVENOUS

## 2017-11-10 MED ORDER — MIDAZOLAM HCL 10 MG/2ML IJ SOLN
INTRAMUSCULAR | Status: DC | PRN
  Administered 2017-11-10: 2 mg via INTRAVENOUS
  Administered 2017-11-10: 1 mg via INTRAVENOUS
  Administered 2017-11-10: 2 mg via INTRAVENOUS

## 2017-11-10 NOTE — Progress Notes (Signed)
[facility]  Date of Admission:  10/30/2017   Total days of antibiotics 12        Day 12 cefazolin         ASSESSMENT: [name] developed a septic right knee and thigh abscess which as been complicated by MSSA bacteremia. Patient is on Day 12 of cefazolin. Repeat blood cultures on 8/22 NGTD. TEE performed today and showed no signs of vegetations. For septic arthritis due to S. aureus in the setting of concomitant bacteremia without evidence of endocarditis, I recommend administering parenteral cefazolin therapy for four weeks.  PLAN: 1. Continue cefazolin 2. PICC in place 3. OPAT ordered; instructions below  Culture Result:  8/22 Blood Cx NGTD   Allergies  Allergen Reactions  . Aspirin Nausea And Vomiting  . Codeine Nausea And Vomiting  . Ibuprofen Nausea And Vomiting  . Penicillins Rash    Has patient had a PCN reaction causing immediate rash, facial/tongue/throat swelling, SOB or lightheadedness with hypotension: YES Has patient had a PCN reaction causing severe rash involving mucus membranes or skin necrosis: NO Has patient had a PCN reaction that required hospitalization: NO Has patient had a PCN reaction occurring within the last 10 years: YES If all of the above answers are "NO", then may proceed with Cephalosporin use.    OPAT Orders Discharge antibiotics: Cefazolin 2 mg Q8h Duration: 4 weeks total  End Date: November 26, 2017  [facility] Care Per Protocol:  Labs weekly while on IV antibiotics: _X_ CBC with differential __ BMP _X_ CMP __ CRP __ ESR __ Vancomycin trough  _X_ Please pull PIC at completion of IV antibiotics __ Please leave PIC in place until doctor has seen patient or been notified  Fax weekly labs to [phone]  Recommend follow-up with her orthopedic surgeon.   Principal Problem:   Bacteremia due to methicillin susceptible Staphylococcus aureus (MSSA) Active Problems:   Occlusion of right  femoral-popliteal bypass graft (HCC)   Septic arthritis of knee, right (HCC)   Abnormal transaminases   COPD (chronic obstructive pulmonary disease) (HCC)   Depression   Hypertension   History of pulmonary embolism   Chronic back pain   Normocytic anemia   History of bleeding peptic ulcer   Scheduled Meds: . amitriptyline  25 mg Oral QHS  . atorvastatin  20 mg Oral q1800  . busPIRone  7.5 mg Oral BID  . clopidogrel  75 mg Oral Daily  . dicyclomine  20 mg Oral TID AC  . ferrous sulfate  325 mg Oral Q breakfast  . gabapentin  100 mg Oral TID  . heparin  5,000 Units Subcutaneous Q8H  . metoprolol tartrate  50 mg Oral BID  . oxyCODONE  15 mg Oral BID  . pantoprazole  40 mg Oral Daily  . sertraline  100 mg Oral Daily  . sodium chloride flush  10-40 mL Intracatheter Q12H  . sodium chloride flush  3 mL Intravenous Q12H  . sucralfate  1 g Oral TID WC & HS  . traZODone  50 mg Oral QHS  . vitamin B-12  1,000 mcg Oral Daily   Continuous Infusions: . sodium chloride 250 mL (11/09/17 1412)  .  ceFAZolin (ANCEF) IV 2 g (11/10/17 0636)   PRN Meds:.sodium chloride, acetaminophen **OR** acetaminophen, albuterol, morphine injection, ondansetron **OR** ondansetron (ZOFRAN) IV, oxyCODONE-acetaminophen, senna-docusate, sodium chloride flush, sodium chloride flush   SUBJECTIVE: Patient reports no acute complaints.   Review  of Systems: Constitutional: Negative forchills,and fever.  Musculoskeletal: Positive forjoint pain (right knee>left).   Allergies  Allergen Reactions  . Aspirin Nausea And Vomiting  . Codeine Nausea And Vomiting  . Ibuprofen Nausea And Vomiting  . Penicillins Rash    Has patient had a PCN reaction causing immediate rash, facial/tongue/throat swelling, SOB or lightheadedness with hypotension: YES Has patient had a PCN reaction causing severe rash involving mucus membranes or skin necrosis: NO Has patient had a PCN reaction that required hospitalization: NO Has  patient had a PCN reaction occurring within the last 10 years: YES If all of the above answers are "NO", then may proceed with Cephalosporin use.    OBJECTIVE: Vitals:   11/09/17 2053 11/10/17 0237 11/10/17 0633 11/10/17 1032  BP: (!) 149/70  (!) 145/64 133/69  Pulse:    81  Resp:      Temp:   98.5 F (36.9 C)   TempSrc:   Oral   SpO2: 99%  98%   Weight:  71.1 kg    Height:       Body mass index is 27.77 kg/m.  Physical Exam  Constitutional: She isoriented to person, place, and time. She is resting quietly in bed.  Pulmonary/Chest:Effort normal Musculoskeletal: Clean, dry incisions around her knee and lateral right thigh. Skin:No rashnoted.   Lab Results Lab Results  Component Value Date   WBC 11.8 (H) 11/08/2017   HGB 9.4 (L) 11/08/2017   HCT 31.3 (L) 11/08/2017   MCV 92.9 11/08/2017   PLT 491 (H) 11/08/2017    Lab Results  Component Value Date   CREATININE 0.62 11/04/2017   BUN 5 (L) 11/04/2017   NA 138 11/04/2017   K 4.1 11/04/2017   CL 105 11/04/2017   CO2 24 11/04/2017    Lab Results  Component Value Date   ALT 31 11/03/2017   AST 38 11/03/2017   ALKPHOS 91 11/03/2017   BILITOT 0.1 (L) 11/03/2017     Microbiology: Recent Results (from the past 240 hour(s))  Culture, blood (Routine X 2) w Reflex to ID Panel     Status: None   Collection Time: 11/04/17  4:30 PM  Result Value Ref Range Status   Specimen Description BLOOD RIGHT ANTECUBITAL  Final   Special Requests   Final    BOTTLES DRAWN AEROBIC AND ANAEROBIC Blood Culture adequate volume   Culture   Final    NO GROWTH 5 DAYS Performed at [facility], [place] [address], [place], [place] [address]    Report Status 11/09/2017 FINAL  Final  Culture, blood (Routine X 2) w Reflex to ID Panel     Status: None   Collection Time: 11/04/17  4:45 PM  Result Value Ref Range Status   Specimen Description BLOOD RIGHT HAND  Final   Special Requests   Final    BOTTLES DRAWN AEROBIC AND ANAEROBIC  Blood Culture results may not be optimal due to an inadequate volume of blood received in culture bottles   Culture   Final    NO GROWTH 5 DAYS Performed at [facility], [place] [address], [place], [place] [address]    Report Status 11/09/2017 FINAL  Final    Courtney Sage, MD [phone] pager 11/10/2017, 11:28 AM

## 2017-11-10 NOTE — Progress Notes (Signed)
  Echocardiogram Echocardiogram Transesophageal has been performed.  Jahlia Omura T Elcie Pelster 11/10/2017, 3:02 PM

## 2017-11-10 NOTE — Interval H&P Note (Signed)
History and Physical Interval Note:  11/10/2017 1:58 PM  Courtney Grant  has presented today for surgery, with the diagnosis of bacteremia  The various methods of treatment have been discussed with the patient and family. After consideration of risks, benefits and other options for treatment, the patient has consented to  Procedure(s): TRANSESOPHAGEAL ECHOCARDIOGRAM (TEE) (N/A) as a surgical intervention .  The patient's history has been reviewed, patient examined, no change in status, stable for surgery.  I have reviewed the patient's chart and labs.  Questions were answered to the patient's satisfaction.     Mertie Moores

## 2017-11-10 NOTE — CV Procedure (Signed)
    Transesophageal Echocardiogram Note  Courtney Grant [id number] 11-17-1932  Procedure: Transesophageal Echocardiogram Indications: bacteremia   Procedure Details Consent: Obtained Time Out: Verified patient identification, verified procedure, site/side was marked, verified correct patient position, special equipment/implants available, Radiology Safety Procedures followed,  medications/allergies/relevent history reviewed, required imaging and test results available.  Performed  Medications:  During this procedure the patient is administered a total of Versed 4  mg and Fentanyl  75  mcg  to achieve and maintain moderate conscious sedation.  The patient's heart rate, blood pressure, and oxygen saturation are monitored continuously during the procedure. The period of conscious sedation is 30   minutes, of which I was present face-to-face 100% of this time.  Left Ventrical:  Normal LV function   Mitral Valve: normal, no vegetation   Aortic Valve:  Thickened, calcified ,   Mild AI , no vegetation   Tricuspid Valve: normal .  No vegetation   Pulmonic Valve: not completely visualized,  No PI,  No obvious vegetations seen   Left Atrium/ Left atrial appendage: no thrombi   Atrial septum: not viusalized   Aorta: moderate calcified plaque    Complications: No apparent complications Patient did tolerate procedure well.   Thayer Headings, Brooke Bonito., MD, [facility] 11/10/2017, 2:55 PM

## 2017-11-10 NOTE — Progress Notes (Signed)
PROGRESS NOTE   Courtney Grant  KCL:[id number]    DOB: 1932/06/27    DOA: 10/30/2017  PCP: Garwin Brothers, MD   I have briefly reviewed patients previous medical records in [facility].  Brief Narrative:  84 y.o.femalewith pmhx ofCOPD, hypertension, history of PE with IVC filter not anticoagulated due to history of bleeding ulcer, peripheral arterial disease with history of right femoral-popliteal bypass graft, depression, chronic pain syndrome followed by outpatient pain MD, HLD, GERD, lung nodules, history of ORIF of right distal femur fracture complicated by nonhealing heel ulcer for which she underwent vascular bypass, remote history of proximal femur fractures status post closed reduction, internal fixation with IM nailing as well as proximal tibia and distal tibia fractures which were initially openly reduced and then finally closed reduced with nailing, who presented to her orthopedic surgeon's office (Dr Joya Salm, [place])  for evaluation of increasing right knee pain and swelling.  A week prior to that visit she had received a steroid injection for history of degenerative arthritis of the knee by her primary care physician.  CT scan revealed a distal right thigh abscess that was suspected to be an implant infection and also showed femoral bypass graft occlusion. She had R knee aspirate that was purulent, and she was then admitted to [facility] on 10/28/2017.  She underwent right thigh/knee I&D and implant removal on 8/16. Initially treated with Vanco/Zosyn, however joint fluid culture and blood cultures grew pansensitive MSSA, patient was changed to IV Ancef on 10/30/2017.  Transferred to [facility] on 10/30/17 due to concerns about right fem-pop bypass graft occlusionand for vascular surgery evaluation.  Vascular surgery evaluated and plans no interventions at this time and plans to follow outpatient.  ID was consulted, recommended work-up for endocarditis.  TTE negative, TEE  requested (cardiology schedule is full) and may not get it until possibly 11/11/2017.  PICC line to be placed 8/26.  DC to SNF pending procedures and work-up.   Assessment & Plan:   Principal Problem:   Bacteremia due to methicillin susceptible Staphylococcus aureus (MSSA) Active Problems:   COPD (chronic obstructive pulmonary disease) (HCC)   Depression   Hypertension   History of pulmonary embolism   Occlusion of right femoral-popliteal bypass graft (HCC)   Chronic back pain   Normocytic anemia   Septic arthritis of knee, right (HCC)   History of bleeding peptic ulcer   Abnormal transaminases  Right knee septic arthritis, right distal thigh abscess with implant infection: prior history of right hip and knee fracture status post nail, screw and femoral plate placement.  Pt was seen by her orthopedic surgeon on 10/28/17 for R knee pain who did a knee aspirate that was grossly purulent so patient was admitted to [facility] that day. CT scan showed right lateral thigh abscess concerning for hardware infection.  Knee aspirate cx's grew pansensitive MSSA. She underwent open right distal thigh abscess irrigation and debridement, right distal femur deep implant removal, right knee arthroscopic irrigation and debridement on 10/29/2017 at [place].   Hospitalist MD at [facility] had discussed with Dr. Bobby Rumpf, ID at [facility] who had initially recommended IV vancomycin and ceftriaxone which was then changed to cefazolin on 8/18 based on culture results.  There was no vascular surg coverage at [facility] to patient was transferred to [facility] on 10/30/17. As per orthopedics from [place] Dr. Joya Salm nonweightbearing on right lower extremity x4-6 weeks minimum, daily PT for lower extremity strengthening and  transfer on left lower extremity and monitor closely for recurrence of abscess or septic arthritis which may need orthopedic reevaluation and further surgery.  Current  findings do not suggest recurrence and will continue to monitor closely - 10/30/2017 ESR was over 140 and CRP was over 27 >> check ESR and CRP in 7 to 10 days to monitor response to treatment, ordered for 8/27 today - pain is better controlled and weaning off of IV morphine >  reduced to 28m every 6 hrs today -Antibiotics management per ID, but greatly appreciated, surveillance blood cultures 822 are negative to date, TTE with no evidence of vegetation, plan for TEE this afternoon by cardiology, pending TEE results, decision will be made about duration of antibiotic recommendation per ID . - Upon discharge patient needs to follow-up with Dr. JJoya Salmand +/- ID based on their recommendation.   MSSA bacteremia -Antibiotic management per ID, on IV cefazolin 10/31/2017, surveillance cultures 822 are negative to date  PAD-- Right Fem-Pop bypass graft occlusionon imaging studies,  ABI on 10/31/2017 showed moderate to severe reduction in the arterial flow with abnormal TBI's, Vascular surgery consult from Dr. CCarlis Abbottappreciated.  Aortogram with bilateral lower extremity runoff 8/19 with occlusion of right SFA (Rt femoropopliteal bypass graft appears to have failed). Continue Lipitor and Plavix . Per [facility] / Dr CCarlis Abbott- no saphenous vein available to re-do bypass, will need prosthetic graft or cryo-vein, high risk to tunnel through/ across the infected thigh and knee at this time, and as pt is minimally symptomatic from the occluded graft, [facility] would prefer that infection be fully treated and they will f/u at a later date (2-3 wks) to make plans.  Essential hypertension:  Controlled.  Continue metoprolol 50 bid  Elevated LFTs--- resolved Acute hepatitis profile was negative at outside facility.  Right upper quadrant ultrasound >status post cholecystectomy, no biliary duct dilatation and no focal lesions identified.   Chronic Pain/ anxiety/ depression - has chronic pain (diffuse, f/b pain clinic) was  on oxycontin 10 tid at home, is on 15 mg bid here, continue - weaning IV MSO4 - percocet 7.5 two tabs every 6 hrs prn/ neurontin 100 tid - continue zoloft + buspar, and elavil+trazodone at night for sleep   History of PE status post IVC filter:  Reportedly not on anticoagulation candidate due to history of bleeding from peptic ulcers, IVC in situ.  Subacute on chronic anemia:  recent hemoglobin apparently usually 10. Suspect progressive recent anemia to be multifactorial: [facility] related bone marrow suppression, daily phlebotomies, iron deficiency and possible complicating underlying chronic disease.  No overt bleeding.  FOBT negative.  Hemoglobin dropped to 7 on 8/22 and patient somewhat symptomatic with worsening dizziness.  Transfused 1 unit PRBC.  Anemia panel appreciated, iron deficiency.  Oral iron supplements. Hemoglobin improved and stable in the 9 g range.  Hypomagnesemia and hypokalemia:  Replaced.   Hyperlipidemia:  Continue atorvastatin.  COPD:  Stable without clinical bronchospasm.  GERD PPI   DVT prophylaxis: Heparin Code Status: Full Family Communication: None at bedside today. Disposition: DC to SNF hopefully in 1 day once TEE is performed and final antibiotic recommendation is known     Consultants:  Vascular surgery Infectious disease Cardiology for TTE.  Procedures:  PICC line placed 8/26.  Antimicrobials:  IV cefazolin   Subjective: No significant events overnight, complains of right lower extremity pain, but overall reports is controlled, denies any fever or chills.  Objective:  Vitals:   11/09/17 2053 11/10/17 0237 11/10/17 0633 11/10/17 1032  BP: (!) 149/70  (!) 145/64 133/69  Pulse:    81  Resp:      Temp:   98.5 F (36.9 C)   TempSrc:   Oral   SpO2: 99%  98%   Weight:  71.1 kg    Height:        Examination:   Awake Alert, Oriented X 3, pleasant, laying  in bed in no apparent distress  Symmetrical Chest wall movement, Good air movement bilaterally, CTAB RRR,No Gallops,Rubs or new Murmurs, No Parasternal Heave +ve B.Sounds, Abd Soft, No tenderness, No rebound - guarding or rigidity. No Cyanosis, Clubbing, has mild ankle edema, Right pain off and on surgical scar over the right lateral thigh, with some knee swelling, couple stitches in the anterior knee area.   Data Reviewed: I have personally reviewed following labs and imaging studies  CBC: Recent Labs  Lab 11/03/17 1947 11/04/17 0303 11/04/17 1644 11/05/17 0737 11/06/17 0804 11/08/17 0342  WBC 13.0* 13.7*  --  13.1* 15.1* 11.8*  HGB 7.3* 7.0* 9.0* 9.4* 9.4* 9.4*  HCT 24.1* 23.4* 28.4* 30.7* 30.8* 31.3*  MCV 91.3 91.8  --  93.0 92.5 92.9  PLT 458* 458*  --  465* 502* 270*   Basic Metabolic Panel: Recent Labs  Lab 11/04/17 0303  NA 138  K 4.1  CL 105  CO2 24  GLUCOSE 109*  BUN 5*  CREATININE 0.62  CALCIUM 7.3*  MG 2.3   Liver Function Tests: No results for input(s): AST, ALT, ALKPHOS, BILITOT, PROT, ALBUMIN in the last 168 hours.    Radiology Studies: No results found.      Scheduled Meds: . amitriptyline  25 mg Oral QHS  . atorvastatin  20 mg Oral q1800  . busPIRone  7.5 mg Oral BID  . clopidogrel  75 mg Oral Daily  . dicyclomine  20 mg Oral TID AC  . ferrous sulfate  325 mg Oral Q breakfast  . gabapentin  100 mg Oral TID  . heparin  5,000 Units Subcutaneous Q8H  . metoprolol tartrate  50 mg Oral BID  . oxyCODONE  15 mg Oral BID  . pantoprazole  40 mg Oral Daily  . sertraline  100 mg Oral Daily  . sodium chloride flush  10-40 mL Intracatheter Q12H  . sodium chloride flush  3 mL Intravenous Q12H  . sucralfate  1 g Oral TID WC & HS  . traZODone  50 mg Oral QHS  . vitamin B-12  1,000 mcg Oral Daily   Continuous Infusions: . sodium chloride 250 mL (11/09/17 1412)  . sodium chloride 20 mL/hr at 11/10/17 1301  .  ceFAZolin (ANCEF) IV 2 g (11/10/17  0636)     LOS: 11 days   Phillips Climes MD Pager [phone] If 7PM-7AM, please contact night-coverage [email or web address] Password [facility] 11/10/2017, 1:36 PM

## 2017-11-11 ENCOUNTER — Encounter (HOSPITAL_COMMUNITY): Payer: Self-pay | Admitting: Cardiovascular Disease

## 2017-11-11 DIAGNOSIS — G894 Chronic pain syndrome: Secondary | ICD-10-CM | POA: Diagnosis not present

## 2017-11-11 DIAGNOSIS — R7881 Bacteremia: Secondary | ICD-10-CM | POA: Diagnosis not present

## 2017-11-11 DIAGNOSIS — J449 Chronic obstructive pulmonary disease, unspecified: Secondary | ICD-10-CM | POA: Diagnosis not present

## 2017-11-11 DIAGNOSIS — M00861 Arthritis due to other bacteria, right knee: Secondary | ICD-10-CM | POA: Diagnosis not present

## 2017-11-11 DIAGNOSIS — M6281 Muscle weakness (generalized): Secondary | ICD-10-CM | POA: Diagnosis not present

## 2017-11-11 DIAGNOSIS — D649 Anemia, unspecified: Secondary | ICD-10-CM | POA: Diagnosis not present

## 2017-11-11 DIAGNOSIS — M810 Age-related osteoporosis without current pathological fracture: Secondary | ICD-10-CM | POA: Diagnosis not present

## 2017-11-11 DIAGNOSIS — Z452 Encounter for adjustment and management of vascular access device: Secondary | ICD-10-CM | POA: Diagnosis not present

## 2017-11-11 DIAGNOSIS — R278 Other lack of coordination: Secondary | ICD-10-CM | POA: Diagnosis not present

## 2017-11-11 DIAGNOSIS — Z7401 Bed confinement status: Secondary | ICD-10-CM | POA: Diagnosis not present

## 2017-11-11 DIAGNOSIS — I739 Peripheral vascular disease, unspecified: Secondary | ICD-10-CM | POA: Diagnosis not present

## 2017-11-11 DIAGNOSIS — T82868A Thrombosis of vascular prosthetic devices, implants and grafts, initial encounter: Secondary | ICD-10-CM | POA: Diagnosis not present

## 2017-11-11 DIAGNOSIS — T82898S Other specified complication of vascular prosthetic devices, implants and grafts, sequela: Secondary | ICD-10-CM | POA: Diagnosis not present

## 2017-11-11 DIAGNOSIS — I10 Essential (primary) hypertension: Secondary | ICD-10-CM | POA: Diagnosis not present

## 2017-11-11 DIAGNOSIS — A4901 Methicillin susceptible Staphylococcus aureus infection, unspecified site: Secondary | ICD-10-CM | POA: Diagnosis not present

## 2017-11-11 DIAGNOSIS — K219 Gastro-esophageal reflux disease without esophagitis: Secondary | ICD-10-CM | POA: Diagnosis not present

## 2017-11-11 DIAGNOSIS — M00061 Staphylococcal arthritis, right knee: Secondary | ICD-10-CM | POA: Diagnosis not present

## 2017-11-11 DIAGNOSIS — E785 Hyperlipidemia, unspecified: Secondary | ICD-10-CM | POA: Diagnosis not present

## 2017-11-11 DIAGNOSIS — T82898A Other specified complication of vascular prosthetic devices, implants and grafts, initial encounter: Secondary | ICD-10-CM | POA: Diagnosis not present

## 2017-11-11 DIAGNOSIS — R262 Difficulty in walking, not elsewhere classified: Secondary | ICD-10-CM | POA: Diagnosis not present

## 2017-11-11 DIAGNOSIS — M2550 Pain in unspecified joint: Secondary | ICD-10-CM | POA: Diagnosis not present

## 2017-11-11 LAB — BASIC METABOLIC PANEL
Anion gap: 8 (ref 5–15)
BUN: 7 mg/dL — ABNORMAL LOW (ref 8–23)
CO2: 26 mmol/L (ref 22–32)
Calcium: 7.9 mg/dL — ABNORMAL LOW (ref 8.9–10.3)
Chloride: 104 mmol/L (ref 98–111)
Creatinine, Ser: 0.63 mg/dL (ref 0.44–1.00)
GFR calc Af Amer: >60 mL/min (ref >60)
GFR calc non Af Amer: >60 mL/min (ref >60)
Glucose, Bld: 93 mg/dL (ref 70–99)
Potassium: 3.6 mmol/L (ref 3.5–5.1)
Sodium: 138 mmol/L (ref 135–145)

## 2017-11-11 LAB — CBC
HCT: 29.3 % — ABNORMAL LOW (ref 36.0–46.0)
Hemoglobin: 8.6 g/dL — ABNORMAL LOW (ref 12.0–15.0)
MCH: 27.5 pg (ref 26.0–34.0)
MCHC: 29.4 g/dL — ABNORMAL LOW (ref 30.0–36.0)
MCV: 93.6 fL (ref 78.0–100.0)
Platelets: 430 10*3/uL — ABNORMAL HIGH (ref 150–400)
RBC: 3.13 MIL/uL — ABNORMAL LOW (ref 3.87–5.11)
RDW: 15.5 % (ref 11.5–15.5)
WBC: 10.3 10*3/uL (ref 4.0–10.5)

## 2017-11-11 MED ORDER — FERROUS SULFATE 325 (65 FE) MG PO TABS: 325.0000 mg | ORAL_TABLET | Freq: Every day | ORAL | 3 refills | Status: DC

## 2017-11-11 MED ORDER — LACTINEX PO CHEW: 1.0000 | CHEWABLE_TABLET | Freq: Three times a day (TID) | ORAL | Status: DC

## 2017-11-11 MED ORDER — ACETAMINOPHEN 325 MG PO TABS: 650.0000 mg | ORAL_TABLET | Freq: Four times a day (QID) | ORAL | Status: DC | PRN

## 2017-11-11 MED ORDER — HEPARIN SOD (PORK) LOCK FLUSH 100 UNIT/ML IV SOLN
250.0000 [IU] | INTRAVENOUS | Status: AC | PRN
  Administered 2017-11-11: 16:00:00

## 2017-11-11 MED ORDER — OXYCODONE HCL ER 15 MG PO T12A: 15.0000 mg | EXTENDED_RELEASE_TABLET | Freq: Two times a day (BID) | ORAL | 0 refills | Status: DC

## 2017-11-11 MED ORDER — CEFAZOLIN SODIUM-DEXTROSE 2-4 GM/100ML-% IV SOLN: 2.0000 g | Freq: Three times a day (TID) | INTRAVENOUS | Status: AC

## 2017-11-11 MED ORDER — OXYCODONE HCL 10 MG PO TABS: 10.0000 mg | ORAL_TABLET | Freq: Four times a day (QID) | ORAL | 0 refills | Status: DC | PRN

## 2017-11-11 NOTE — Progress Notes (Signed)
Clinical Social Worker facilitated patient discharge including contacting patient family and facility to confirm patient discharge plans.  Clinical information faxed to facility and family agreeable with plan.  CSW arranged ambulance transport via [facility] to [facility].  RN to call [phone] (pt will go to room 111) for report prior to discharge.  Clinical Social Worker will sign off for now as social work intervention is no longer needed. Please consult [place] again if new need arises.  Rhea Pink, MSW, [place]

## 2017-11-11 NOTE — Progress Notes (Signed)
Completed giving report to [place] skill facility with Junious Silk, RN. AVS was put in Pt envelope and give to [facility] staff.  Pt transferred via [facility] at 15:50. All Pt belong given back to Pt.   Courtney Grant [id number],XH

## 2017-11-11 NOTE — Progress Notes (Signed)
Physical Therapy Treatment Patient Details Name: Courtney Grant MRN: [id number] DOB: [birth year] Today's Date: 11/11/2017    History of Present Illness Pt adm to [facility] on 8/15 with rt septic knee, rt thigh abscess, and likely infected hardware. Pt also with occluded rt fem-pop bypass graft. On 10/29/17 at [facility] pt underwent I&D of rt knee/thigh and removal of plate and screws from previous ORIF. Pt transferred to [facility] on 8/17 for vascular consult concerning occluded bypass graft. PMH - rt femur fx x 2,  COPD, hypertension, history of PE with IVC filter not anticoagulated due to history of bleeding ulcer, peripheral arterial disease with history of right femoral-pop bypass, depression.    PT Comments    Patient limited by pain and weakness. Unable to stand pivot with me but RN reports she has twice earlier. Lateral scooting with mod A. Pt reports frustration with limited mobility. Plans to d/c to SNF today, encourged her to have positive attitude and work with therapy there to get to moving better.   Patient suffers from right septic knee joint which impairs their ability to perform daily activities like ambulating in the home. A cane will not resolve  issue with performing activities of daily living. A wheelchair will allow patient to safely perform daily activities. Patient is not able to propel themselves in the home using a standard weight wheelchair due to general weakness. Patient can self propel in the lightweight wheelchair.  Accessories: elevating leg rests (ELRs), wheel locks, extensions and anti-tippers.     Follow Up Recommendations  SNF     Equipment Recommendations  Wheelchair (measurements PT);Wheelchair cushion (measurements PT)    Recommendations for Other Services       Precautions / Restrictions Precautions Precautions: Fall Restrictions Weight Bearing Restrictions: Yes RLE Weight Bearing: Non weight bearing Other Position/Activity Restrictions: x 4  weeks per ortho at [place] bed mobility: Needs Assistance Bed Mobility: Supine to Sit     Supine to sit: HOB elevated;Min guard Sit to supine: Min assist   General bed mobility comments: Incr time and effort  Transfers Overall transfer level: Needs assistance   Transfers: Set designer Transfers;Lateral/Scoot Transfers Sit to Stand: Mod assist Stand pivot transfers: Mod assist          Ambulation/Gait             General Gait Details: unable due to pain/NWB status   Stairs             Wheelchair Mobility    Modified Rankin (Stroke Patients Only)       Balance Overall balance assessment: Needs assistance Sitting-balance support: Feet supported;No upper extremity supported Sitting balance-Leahy Scale: Good                                      Cognition Arousal/Alertness: Awake/alert Behavior During Therapy: WFL for tasks assessed/performed                                   General Comments: anxious in poor spirits about her limited mobility.      Exercises      General Comments        Pertinent Vitals/Pain Pain Assessment: Faces Faces Pain Scale: Hurts little more Pain Location: R knee Pain Descriptors / Indicators: Grimacing;Guarding Pain Intervention(s): Limited activity  within patient's tolerance;Monitored during session;Premedicated before session    Home Living                      Prior Function            PT Goals (current goals can now be found in the care plan section) Acute Rehab PT Goals Patient Stated Goal: home PT Goal Formulation: With patient Time For Goal Achievement: 11/14/17 Potential to Achieve Goals: Fair Progress towards PT goals: Progressing toward goals    Frequency    Min 2X/week      PT Plan Current plan remains appropriate;Frequency needs to be updated    Co-evaluation              AM-PAC PT "6 Clicks" Daily  Activity  Outcome Measure  Difficulty turning over in bed (including adjusting bedclothes, sheets and blankets)?: A Lot Difficulty moving from lying on back to sitting on the side of the bed? : A Lot Difficulty sitting down on and standing up from a chair with arms (e.g., wheelchair, bedside commode, etc,.)?: Unable Help needed moving to and from a bed to chair (including a wheelchair)?: Total Help needed walking in hospital room?: Total Help needed climbing 3-5 steps with a railing? : Total 6 Click Score: 8    End of Session Equipment Utilized During Treatment: Gait belt Activity Tolerance: Patient tolerated treatment well Patient left: with call bell/phone within reach;in chair;with chair alarm set Nurse Communication: Mobility status PT Visit Diagnosis: Other abnormalities of gait and mobility (R26.89);Pain Pain - Right/Left: Right Pain - part of body: Knee     Time: 1500-1520 PT Time Calculation (min) (ACUTE ONLY): 20 min  Charges:  $Therapeutic Activity: 8-22 mins                     Reinaldo Berber, PT, DPT Acute Rehab Services Pager: [phone]     Reinaldo Berber 11/11/2017, 3:34 PM

## 2017-11-11 NOTE — Discharge Instructions (Signed)
Follow with Primary MD Garwin Brothers, MD OR SNF physician in 3 days   Activity: As per orthopedics from [place] surgeon Dr. Joya Salm nonweightbearing on right lower extremity x4-6 weeks minimum, daily PT for lower extremity strengthening and transfer on left lower extremity   Disposition snf   Diet: Heart Healthy ,  with feeding assistance and aspiration precautions.  Antibiotics recommendation: Discharge antibiotics: Cefazolin 2 mg Q8h Duration: 4 weeks total  End Date: November 26, 2017  [facility] Care Per Protocol:  Labs weekly while on IV antibiotics: _X_ CBC with differential __ BMP _X_ CMP __ CRP __ ESR __ Vancomycin trough  _X_ Please pull PIC at completion of IV antibiotics __ Please leave PIC in place until doctor has seen patient or been notified  Fax weekly labs to [phone]  Recommend follow-up with her orthopedic surgeon.  For Heart failure patients - Check your Weight same time everyday, if you gain over 2 pounds, or you develop in leg swelling, experience more shortness of breath or chest pain, call your Primary MD immediately. Follow Cardiac Low Salt Diet and 1.5 lit/day fluid restriction.   On your next visit with your primary care physician please Get Medicines reviewed and adjusted.   Please request your Prim.MD to go over all Hospital Tests and Procedure/Radiological results at the follow up, please get all Hospital records sent to your Prim MD by signing hospital release before you go home.   If you experience worsening of your admission symptoms, develop shortness of breath, life threatening emergency, suicidal or homicidal thoughts you must seek medical attention immediately by calling 911 or calling your MD immediately  if symptoms less severe.  You Must read complete instructions/literature along with all the possible adverse reactions/side effects for all the Medicines you take and that have been prescribed to you. Take any new  Medicines after you have completely understood and accpet all the possible adverse reactions/side effects.   Do not drive, operating heavy machinery, perform activities at heights, swimming or participation in water activities or provide baby sitting services if your were admitted for syncope or siezures until you have seen by Primary MD or a Neurologist and advised to do so again.  Do not drive when taking Pain medications.    Do not take more than prescribed Pain, Sleep and Anxiety Medications  Special Instructions: If you have smoked or chewed Tobacco  in the last 2 yrs please stop smoking, stop any regular Alcohol  and or any Recreational drug use.  Wear Seat belts while driving.   Please note  You were cared for by a hospitalist during your hospital stay. If you have any questions about your discharge medications or the care you received while you were in the hospital after you are discharged, you can call the unit and asked to speak with the hospitalist on call if the hospitalist that took care of you is not available. Once you are discharged, your primary care physician will handle any further medical issues. Please note that NO REFILLS for any discharge medications will be authorized once you are discharged, as it is imperative that you return to your primary care physician (or establish a relationship with a primary care physician if you do not have one) for your aftercare needs so that they can reassess your need for medications and monitor your lab values.

## 2017-11-11 NOTE — Care Management Note (Signed)
Case Management Note Previous CM note completed by Erenest Rasher, RN 11/02/2017, 5:44 PM    Patient Details  Name: Courtney Grant MRN: [id number] Date of Birth: 1933-01-22  Subjective/Objective:   Right Septic Knee, right thigh abscess, Vascular Surgery                 Action/Plan: NCM spoke to pt and she lives at home with dtr, [name], attempted call to dtr but she was not available. PT recommended SNF for rehab and IV abx. CSW referral for SNF. Will continue to follow for dc needs.  Expected Discharge Date:  11/11/17               Expected Discharge Plan:  [place]  In-House Referral:  Clinical Social Work  Discharge planning Services  CM Consult  Post Acute Care Choice:  NA Choice offered to:  NA  DME Arranged:  N/A DME Agency:  NA  HH Arranged:    [place] Agency:     Status of Service:  Completed, signed off  If discussed at [employer] of Stay Meetings, dates discussed:    Discharge Disposition: skilled facility   Additional Comments:  11/11/17- 1115- Marvetta Gibbons RN, CM- pt admitted with bacteremia due to MRSA  s/p I&D and implant removal for septic arthritis.(pt will be non-wt bearing for 4-6 wks) Pt will need long-term IV abx- PICC line has been placed and TEE done to r/o endocarditis. CSW following for transition to SNF. Pt stable for transition today.   Marvetta Gibbons [place], RN 11/11/2017, 11:14 AM [phone] 4E Transition Care Coordinator

## 2017-11-11 NOTE — Progress Notes (Signed)
Patient was able to get up and walk several steps to the bedside commode.  Patient was strongly encouraged to sit up in the chair after using the commode but she adamantly refused.  Will continue to monitor and encourage ambulation.

## 2017-11-11 NOTE — Discharge Summary (Signed)
Courtney Grant, is a 84 y.o. female  DOB 1933/02/05  MRN [id number].  Admission date:  10/30/2017  Admitting Physician  Vianne Bulls, MD  Discharge Date:  11/11/2017   Primary MD  Garwin Brothers, MD  Recommendations for primary care physician for things to follow:  -Patient to continue with IV cefazolin through 11/26/2017, please see instructions below regarding antibiotics, and picc LINE CARE. -Patient to follow with her primary orthopedic within 1 week from discharge Dr. Lorin Mercy -Patient to follow within 2 to 3 weeks from discharge with vascular surgery  Admission Diagnosis  Septic arthritis ([place]) [M00.9]   Discharge Diagnosis  Septic arthritis ([place]) [M00.9]    Principal Problem:   Bacteremia due to methicillin susceptible Staphylococcus aureus (MSSA) Active Problems:   COPD (chronic obstructive pulmonary disease) ([place])   Depression   Hypertension   History of pulmonary embolism   Occlusion of right femoral-popliteal bypass graft (HCC)   Chronic back pain   Normocytic anemia   Septic arthritis of knee, right (HCC)   History of bleeding peptic ulcer   Abnormal transaminases      Past Medical History:  Diagnosis Date  . Benign paroxysmal positional vertigo 10/25/2015  . Complication of anesthesia   . COPD (chronic obstructive pulmonary disease) ([place])   . Lung cancer ([place]) 02/23/2016   ADENOCARINOMA RUL  . PONV (postoperative nausea and vomiting)   . Tobacco abuse   . Venous thrombosis    H/O    Past Surgical History:  Procedure Laterality Date  . ABDOMINAL AORTOGRAM W/LOWER EXTREMITY Right 11/01/2017   Ultrasound-guided cannulation left common femoral artery  . ABDOMINAL AORTOGRAM W/LOWER EXTREMITY N/A 11/01/2017   Procedure: ABDOMINAL AORTOGRAM W/LOWER EXTREMITY;  Surgeon: Waynetta Sandy, MD;  Location: [facility];  Service: Cardiovascular;  Laterality: N/A;  . LUNG  LOBECTOMY  02/23/2006   DR.BURNEY  . REVISION TOTAL HIP ARTHROPLASTY    . THORACOTOMY  02/23/2006   DR.BURNEY  . TIBIA FRACTURE SURGERY         History of present illness and  Hospital Course:     Kindly see H&P for history of present illness and admission details, please review complete Labs, Consult reports and Test reports for all details in brief  HPI  from the history and physical done on the day of admission 10/30/2017   HPI: Courtney Grant is a 84 y.o. female with medical history significant for COPD, hypertension, history of PE with IVC filter not anticoagulated due to history of bleeding ulcer, peripheral arterial disease with history of right femoral-popliteal bypass graft, and depression, who presented to her orthopedic surgeon's office for evaluation of right knee pain, had aspirate that was purulent, and she was then admitted to [facility] on 10/28/2017.  [facility] Course: She was admitted on 10/28/2017 with right knee pain and purulent aspirate in the outpatient clinic, had CT scan revealed a distal right thigh abscess that was suspected to be an implant infection.  Imaging also was concerning for occlusion of  the right femoral-popliteal bypass graft.  In consultation with ID, patient was started on vancomycin and Rocephin.  Synovial fluid and blood cultures grew pansensitive MSSA and she was changed to cefazolin today.  On 10/29/2017, she underwent irrigation of the right knee and I&D of the thigh abscess.  There was no vascular surgery coverage at the outside hospital, Dr. Carlis Abbott vascular surgery was consulted and agreed with transfer to [facility].  She will be admitted to the hospitalist service at [facility] with vascular surgery consultation for suspected occlusion of the right femoral-popliteal bypass grafting.   Hospital Course   84 y.o.femalewith pmhx ofCOPD, hypertension, history of PE with IVC filter not anticoagulated due to history of  bleeding ulcer, peripheral arterial disease with history of right femoral-popliteal bypass graft, depression, chronic pain syndrome followed by outpatient pain MD, HLD, GERD, lung nodules, history of ORIF of right distal femur fracture complicated by nonhealing heel ulcer for which she underwent vascular bypass, remote history of proximal femur fractures status post closed reduction, internal fixation with IM nailing as well as proximal tibia and distal tibia fractures which were initially openly reduced and then finally closed reduced with nailing, who presented to her orthopedic surgeon's office(Dr Joya Salm, [place])for evaluation of increasing right knee pain and swelling.  A week prior to that visit she had received a steroid injection for history of degenerative arthritis of the knee by her primary care physician.  CT scan revealed a distal right thigh abscess that was suspected to be an implant infection and also showed femoral bypass graft occlusion. She had R knee aspirate that was purulent, and she was then admitted to [facility] on 10/28/2017.  She underwent right thigh/knee I&D and implant removal on 8/16. Initially treated with Vanco/Zosyn, however joint fluid culture and blood cultures grew pansensitive MSSA, patient was changed to IV Ancef on 10/30/2017. Transferred to [facility] on 10/30/17 due to concerns about right fem-pop bypass graft occlusionand for vascular surgery evaluation.  Vascular surgery evaluated and plans no interventions at this time and plans to follow outpatient.  ID was consulted, recommended work-up for endocarditis.  TTE negative, TEE was performed 11/11/2017, negative for any vegetation.  PICC line to be placed 8/26.  DC to SNF TODAY.   Right knee septic arthritis, right distal thigh abscess with implant infection: prior history of right hip and knee fracture status post nail, screw and femoral plate placement.  Pt was seen by her orthopedic surgeon on  10/28/17 for R knee pain who did a knee aspirate that was grossly purulent so patient was admitted to [facility] that day. CT scan showed right lateral thigh abscess concerning for hardware infection.  Knee aspirate cx's grew pansensitive MSSA. She underwent open right distal thigh abscess irrigation and debridement, right distal femur deep implant removal, right knee arthroscopic irrigation and debridement on 10/29/2017 at [place].   Hospitalist MD at [facility] had discussed with Dr. Bobby Rumpf, ID at [facility] health who had initially recommended IV vancomycin and ceftriaxone which was then changed to cefazolin on 8/18 based on culture results.  There was no vascular surg coverage at [facility] to patient was transferred to [facility] on 10/30/17. As per orthopedics from [place] surgeon Dr. Joya Salm nonweightbearing on right lower extremity x4-6 weeks minimum, daily PT for lower extremity strengthening and transfer on left lower extremity and monitor closely for recurrence of abscess or septic arthritis which may need orthopedic reevaluation and further surgery.  Current findings  do not suggest recurrence and will continue to monitor closely - 10/30/2017 ESR was over 140 and CRP was over 27 -Antibiotics management per ID, but greatly appreciated, surveillance blood cultures 8/22 are negative to date, TTE with no evidence of vegetation, TEE was performed 11/10/2017, with no evidence of vegetation, final recommendation by ID for antibiotic, has been to continue cefazolin for total of 28 days, with stop date 11/26/2017, she will be discharged with a PICC line . - Upon discharge patient needs to follow-up with Dr. Joya Salm within 1 week  MSSA bacteremia -Antibiotic management per ID, TEE with no evidence of vegetation, recommendation for IV cefazolin total of 28 days with stop date 11/26/2017 .  PAD-- Right Fem-Pop bypass graft occlusionon imaging studies,  ABI on 10/31/2017 showed  moderate to severe reduction in the arterial flow with abnormal TBI's, Vascular surgery consult from Dr. Carlis Abbott appreciated.  Aortogram with bilateral lower extremity runoff 8/19 with occlusion of right SFA (Rt femoropopliteal bypass graft appears to have failed). Continue Lipitor and Plavix . Per [facility] / Dr Carlis Abbott - no saphenous vein available to re-do bypass, will need prosthetic graft or cryo-vein, high risk to tunnel through/ across the infected thigh and knee at this time, and as pt is minimally symptomatic from the occluded graft, [facility] would prefer that infection be fully treated and they will f/u at a later date (2-3 wks) to make plans.  Essential hypertension:  Controlled.  Continue metoprolol 50 bid  Elevated LFTs--- resolved Acute hepatitis profile was negative at outside facility.  Right upper quadrant ultrasound >status post cholecystectomy, no biliary duct dilatation and no focal lesions identified.   Chronic Pain/ anxiety/ depression - has chronic pain (diffuse, f/b pain clinic) was on oxycontin 10 tid at home, is on 15 mg bid here, continue -Starting oxycodone 10 mg every 6 hours as needed - continue zoloft + buspar, and elavil+trazodone at night for sleep  History of PE status post IVC filter:  Reportedly not on anticoagulation candidate due to history of bleeding from peptic ulcers, IVC in situ.  Subacute on chronic anemia:  recent hemoglobin apparently usually 10, globin is 8.6 on discharge, will be discharged on iron supplements. Suspect progressive recent anemia to be multifactorial: Recent blood loss during orthopedic surgery at [facility], acute illness related bone marrow suppression, daily phlebotomies, iron deficiency and possible complicating underlying chronic disease.  No overt bleeding.  FOBT negative.  Hemoglobin dropped to 7 on 8/22 and patient somewhat symptomatic with worsening dizziness.  Transfused 1 unit PRBC.  Anemia panel appreciated, iron  deficiency.  Oral iron supplements.  Hypomagnesemia and hypokalemia:  Replaced.   Hyperlipidemia:  Continue atorvastatin.  COPD:  Stable without clinical bronchospasm.  GERD PPI    Discharge Condition:  Stable   Follow UP   Contact information for follow-up providers    Marty Heck, MD. Schedule an appointment as soon as possible for a visit in 2 week(s).   Specialty:  Vascular Surgery Contact information: [place] [address] [phone]        Joya Salm, MD. Schedule an appointment as soon as possible for a visit in 1 week(s).   Specialty:  Orthopedic Surgery Contact information: [place] [address] [phone]            Contact information for after-discharge care    [facility] .   Service:  Skilled Nursing Contact information: [address] [place] [phone]                    Discharge Instructions  and  Discharge Medications   Follow with Primary MD Garwin Brothers, MD OR SNF physician in 3 days   Activity: As per orthopedics from [place] surgeon Dr. Joya Salm nonweightbearing on right lower extremity x4-6 weeks minimum, daily PT for lower extremity strengthening and transfer on left lower extremity   Disposition snf   Diet: Heart Healthy ,  with feeding assistance and aspiration precautions.  Antibiotics recommendation: Discharge antibiotics: Cefazolin 2 mg Q8h Duration: 4 weeks total  End Date: November 26, 2017  Continuing Care Hospital Care Per Protocol:  Labs weekly while on IV antibiotics: _X_ CBC with differential __ BMP _X_ CMP __ CRP __ ESR __ Vancomycin trough  _X_ Please pull PIC at completion of IV antibiotics __ Please leave PIC in place until doctor has seen patient or been notified  Fax weekly labs to [phone]  Recommend follow-up with her orthopedic surgeon.  For  Heart failure patients - Check your Weight same time everyday, if you gain over 2 pounds, or you develop in leg swelling, experience more shortness of breath or chest pain, call your Primary MD immediately. Follow Cardiac Low Salt Diet and 1.5 lit/day fluid restriction.   On your next visit with your primary care physician please Get Medicines reviewed and adjusted.   Please request your Prim.MD to go over all Hospital Tests and Procedure/Radiological results at the follow up, please get all Hospital records sent to your Prim MD by signing hospital release before you go home.   If you experience worsening of your admission symptoms, develop shortness of breath, life threatening emergency, suicidal or homicidal thoughts you must seek medical attention immediately by calling 911 or calling your MD immediately  if symptoms less severe.  You Must read complete instructions/literature along with all the possible adverse reactions/side effects for all the Medicines you take and that have been prescribed to you. Take any new Medicines after you have completely understood and accpet all the possible adverse reactions/side effects.   Do not drive, operating heavy machinery, perform activities at heights, swimming or participation in water activities or provide baby sitting services if your were admitted for syncope or siezures until you have seen by Primary MD or a Neurologist and advised to do so again.  Do not drive when taking Pain medications.    Do not take more than prescribed Pain, Sleep and Anxiety Medications  Special Instructions: If you have smoked or chewed Tobacco  in the last 2 yrs please stop smoking, stop any regular Alcohol  and or any Recreational drug use.  Wear Seat belts while driving.   Please note  You were cared for by a hospitalist during your hospital stay. If you have any questions about your discharge medications or the care you received while you were in the hospital  after you are discharged, you can call the unit and asked to speak with the hospitalist on call if the hospitalist that took care of you is not available. Once you are discharged, your primary care physician will handle any further medical issues. Please note that NO REFILLS for any discharge medications will be authorized once you are discharged, as it is imperative that you return to your primary care physician (or establish a relationship with a primary care physician if you do not have one) for your aftercare  needs so that they can reassess your need for medications and monitor your lab values.   Allergies as of 11/11/2017      Reactions   Aspirin Nausea And Vomiting   Codeine Nausea And Vomiting   Ibuprofen Nausea And Vomiting   Penicillins Rash   Has patient had a PCN reaction causing immediate rash, facial/tongue/throat swelling, SOB or lightheadedness with hypotension: YES Has patient had a PCN reaction causing severe rash involving mucus membranes or skin necrosis: NO Has patient had a PCN reaction that required hospitalization: NO Has patient had a PCN reaction occurring within the last 10 years: YES If all of the above answers are "NO", then may proceed with Cephalosporin use.      Medication List    STOP taking these medications   meclizine 25 MG tablet Commonly known as:  ANTIVERT     TAKE these medications   acetaminophen 325 MG tablet Commonly known as:  TYLENOL Take 2 tablets (650 mg total) by mouth every 6 (six) hours as needed for mild pain (or Fever >/= 101).   amitriptyline 25 MG tablet Commonly known as:  ELAVIL Take 25 mg by mouth at bedtime.   atorvastatin 20 MG tablet Commonly known as:  LIPITOR Take 20 mg by mouth at bedtime.   B-12 PO Take 1 tablet by mouth daily.   busPIRone 7.5 MG tablet Commonly known as:  BUSPAR Take 7.5 mg by mouth 2 (two) times daily.   CALCIUM & VIT D3 BONE HEALTH PO Take 1 tablet by mouth daily.   ceFAZolin 2-4 GM/100ML-%  IVPB Commonly known as:  ANCEF Inject 100 mLs (2 g total) into the vein every 8 (eight) hours for 15 days. Continue with antibiotic with stop date 11/26/2017   clopidogrel 75 MG tablet Commonly known as:  PLAVIX Take 75 mg by mouth daily.   dicyclomine 20 MG tablet Commonly known as:  BENTYL Take 20 mg by mouth 3 (three) times daily as needed for spasms.   ferrous sulfate 325 (65 FE) MG tablet Take 1 tablet (325 mg total) by mouth daily with breakfast. Start taking on:  11/12/2017   gabapentin 100 MG capsule Commonly known as:  NEURONTIN Take 100 mg by mouth 2 (two) times daily.   ICAPS AREDS 2 PO Take 2 tablets by mouth daily.   lactobacillus acidophilus & bulgar chewable tablet Chew 1 tablet by mouth 3 (three) times daily with meals.   loratadine 10 MG tablet Commonly known as:  CLARITIN Take 10 mg by mouth daily.   metoprolol tartrate 50 MG tablet Commonly known as:  LOPRESSOR Take 50 mg by mouth 2 (two) times daily.   oxyCODONE 15 mg 12 hr tablet Commonly known as:  OXYCONTIN Take 1 tablet (15 mg total) by mouth 2 (two) times daily. What changed:    medication strength  how much to take  when to take this   Oxycodone HCl 10 MG Tabs Take 1 tablet (10 mg total) by mouth every 6 (six) hours as needed (SEVERE PAIN). What changed:  You were already taking a medication with the same name, and this prescription was added. Make sure you understand how and when to take each.   pantoprazole 40 MG tablet Commonly known as:  PROTONIX Take 40 mg by mouth daily.   Potassium 99 MG Tabs Take 99 mg by mouth daily.   sertraline 100 MG tablet Commonly known as:  ZOLOFT Take 100 mg by mouth daily.   sucralfate 1 g  tablet Commonly known as:  CARAFATE Take 1 g by mouth 4 (four) times daily -  with meals and at bedtime.   traZODone 50 MG tablet Commonly known as:  DESYREL Take 50 mg by mouth at bedtime.   VENTOLIN HFA 108 (90 Base) MCG/ACT inhaler Generic drug:   albuterol Inhale 2 puffs into the lungs every 6 (six) hours as needed.         Diet and Activity recommendation: See Discharge Instructions above   Consults obtained -   Vascular surgery Infectious disease Cardiology for TTE.  Major procedures and Radiology Reports - PLEASE review detailed and final reports for all details, in brief -   PICC line placed 8/26 TEE 11/10/2017 Procedure: Transesophageal Echocardiogram Indications: bacteremia   Procedure Details Consent: Obtained Time Out: Verified patient identification, verified procedure, site/side was marked, verified correct patient position, special equipment/implants available, Radiology Safety Procedures followed,  medications/allergies/relevent history reviewed, required imaging and test results available.  Performed  Medications:  During this procedure the patient is administered a total of Versed 4  mg and Fentanyl  75  mcg  to achieve and maintain moderate conscious sedation.  The patient's heart rate, blood pressure, and oxygen saturation are monitored continuously during the procedure. The period of conscious sedation is 30   minutes, of which I was present face-to-face 100% of this time.  Left Ventrical:  Normal LV function   Mitral Valve: normal, no vegetation   Aortic Valve:  Thickened, calcified ,   Mild AI , no vegetation   Tricuspid Valve: normal .  No vegetation   Pulmonic Valve: not completely visualized,  No PI,  No obvious vegetations seen   Left Atrium/ Left atrial appendage: no thrombi   Atrial septum: not viusalized   Aorta: moderate calcified plaque    Complications: No apparent complications Patient did tolerate procedure well.   Thayer Headings, Brooke Bonito., MD, [facility] 11/10/2017, 2:55 PM   [place] Ekg Site Rite  Result Date: 11/07/2017 If [facility] image not attached, placement could not be confirmed due to current cardiac rhythm.  US Abdomen Limited Ruq  Result Date:  10/30/2017 CLINICAL DATA:  Abnormal liver function studies. Previous cholecystectomy. EXAM: ULTRASOUND ABDOMEN LIMITED RIGHT UPPER QUADRANT COMPARISON:  Intraoperative cholangiogram 08/16/2015 FINDINGS: Gallbladder: Gallbladder is surgically absent. No fluid or mass demonstrated in the gallbladder fossa. Common bile duct: Diameter: 6.8 mm, normal Liver: Somewhat limited visualization due to rib shadowing. Liver parenchymal echotexture appears to be normal and homogeneous. No focal lesions are identified. Portal vein is patent on color Doppler imaging with normal direction of blood flow towards the liver. IMPRESSION: 1. Surgical absence of the gallbladder.  No bile duct dilatation. 2. Homogeneous appearance of the liver. No focal lesions identified. Electronically Signed   By: Lucienne Capers M.D.   On: 10/30/2017 23:46    Micro Results    Recent Results (from the past 240 hour(s))  Culture, blood (Routine X 2) w Reflex to ID Panel     Status: None   Collection Time: 11/04/17  4:30 PM  Result Value Ref Range Status   Specimen Description BLOOD RIGHT ANTECUBITAL  Final   Special Requests   Final    BOTTLES DRAWN AEROBIC AND ANAEROBIC Blood Culture adequate volume   Culture   Final    NO GROWTH 5 DAYS Performed at [facility], [place] [address] [place], [place] [address]    Report Status 11/09/2017 FINAL  Final  Culture, blood (Routine X 2)  w Reflex to ID Panel     Status: None   Collection Time: 11/04/17  4:45 PM  Result Value Ref Range Status   Specimen Description BLOOD RIGHT HAND  Final   Special Requests   Final    BOTTLES DRAWN AEROBIC AND ANAEROBIC Blood Culture results may not be optimal due to an inadequate volume of blood received in culture bottles   Culture   Final    NO GROWTH 5 DAYS Performed at [facility], [place] [address], [place], [place] [address]    Report Status 11/09/2017 FINAL  Final       Today   Subjective:   Courtney Grant today has no headache,  no chest pain, no abdominal pain, she reports some right lower extremity pain .  Objective:   Blood pressure (!) 134/51, pulse 80, temperature 97.9 F (36.6 C), temperature source Oral, resp. rate 16, height 5' 3"  (1.6 m), weight 68.9 kg, SpO2 95 %.   Intake/Output Summary (Last 24 hours) at 11/11/2017 1110 Last data filed at 11/11/2017 0354 Gross per 24 hour  Intake 220 ml  Output 1800 ml  Net -1580 ml    Exam Awake Alert, Oriented x 3, No new F.N deficits, Normal affect Symmetrical Chest wall movement, Good air movement bilaterally, CTAB RRR,No Gallops,Rubs or new Murmurs, No Parasternal Heave +ve B.Sounds, Abd Soft, Non tender,  No rebound -guarding or rigidity. No Cyanosis, Clubbing or edema, some right knee swelling, mild, lateral thigh surgical scar looks clean with no oozing or discharge, sutures in place, couple sutures as well in the anterior knee area  Data Review   CBC w Diff:  Lab Results  Component Value Date   WBC 10.3 11/11/2017   HGB 8.6 (L) 11/11/2017   HCT 29.3 (L) 11/11/2017   PLT 430 (H) 11/11/2017   LYMPHOPCT 11 10/30/2017   MONOPCT 8 10/30/2017   EOSPCT 1 10/30/2017   BASOPCT 0 10/30/2017    CMP:  Lab Results  Component Value Date   NA 138 11/11/2017   K 3.6 11/11/2017   CL 104 11/11/2017   CO2 26 11/11/2017   BUN 7 (L) 11/11/2017   CREATININE 0.63 11/11/2017   PROT 5.2 (L) 11/03/2017   ALBUMIN 1.6 (L) 11/03/2017   BILITOT 0.1 (L) 11/03/2017   ALKPHOS 91 11/03/2017   AST 38 11/03/2017   ALT 31 11/03/2017  .   Total Time in preparing paper work, data evaluation and todays exam - 21 minutes  Phillips Climes M.D on 11/11/2017 at 11:10 AM  [facility]   Office  [phone]

## 2017-11-11 NOTE — Progress Notes (Signed)
PHARMACY CONSULT NOTE FOR:  OUTPATIENT  PARENTERAL ANTIBIOTIC THERAPY (OPAT)  Indication: MSSA bacteremia, septic arthritis of R knee, thigh abscess Regimen: cefazolin 2 g IV q8h End date: 11/26/17  IV antibiotic discharge orders are pended. To discharging provider:  please sign these orders via discharge navigator,  Select New Orders & click on the button choice - Manage This Unsigned Work.     Thank you for allowing pharmacy to be a part of this patient's care.  Jackson Latino, PharmD PGY1 Pharmacy Resident Phone [phone] 11/11/2017     8:25 AM

## 2017-11-17 DIAGNOSIS — A4901 Methicillin susceptible Staphylococcus aureus infection, unspecified site: Secondary | ICD-10-CM | POA: Diagnosis not present

## 2017-11-17 DIAGNOSIS — G894 Chronic pain syndrome: Secondary | ICD-10-CM | POA: Diagnosis not present

## 2017-11-17 DIAGNOSIS — I10 Essential (primary) hypertension: Secondary | ICD-10-CM | POA: Diagnosis not present

## 2017-11-17 DIAGNOSIS — I739 Peripheral vascular disease, unspecified: Secondary | ICD-10-CM | POA: Diagnosis not present

## 2017-11-23 ENCOUNTER — Encounter: Payer: Self-pay | Admitting: Vascular Surgery

## 2017-11-23 ENCOUNTER — Other Ambulatory Visit: Payer: Self-pay

## 2017-11-23 ENCOUNTER — Ambulatory Visit (INDEPENDENT_AMBULATORY_CARE_PROVIDER_SITE_OTHER): Payer: Medicare Other | Admitting: Vascular Surgery

## 2017-11-23 VITALS — BP 146/60 | HR 84 | Resp 18 | Ht 63.0 in | Wt 151.0 lb

## 2017-11-23 DIAGNOSIS — T82898A Other specified complication of vascular prosthetic devices, implants and grafts, initial encounter: Secondary | ICD-10-CM

## 2017-11-23 NOTE — Progress Notes (Signed)
Patient name: Courtney Grant MRN: [id number] DOB: September 15, 1932 Sex: female  REASON FOR VISIT: Hospital follow-up  HPI: Courtney Grant is a 84 y.o. female who was recently seen in the hospital for incidentally discovered occluded right femoral to above-knee pop bypass.  Her initial presentation was actually for septic arthritis of her right knee requiring I&D and hardware removal.  During a CT scan prior to transfer it was noted that her bypass was occluded.  We felt this was probably a subclinical occlusion given she had no worsening baseline symptoms other than chronic numbness in her right leg that is been present for years even before her bypass.  Appears her bypass was initially done at [facility] for a wound that has since healed.  She saw her orthopedic surgeon yesterday and he removed staples and sutures from her right knee.    Today again most of her symptoms are around her right knee where her hardware was removed.  She does continue to complain of some chronic numbness in her right foot but best I can tell that has been ongoing for years.  Past Medical History:  Diagnosis Date  . Benign paroxysmal positional vertigo 10/25/2015  . Complication of anesthesia   . COPD (chronic obstructive pulmonary disease) ([place])   . Lung cancer ([place]) 02/23/2016   ADENOCARINOMA RUL  . PONV (postoperative nausea and vomiting)   . Tobacco abuse   . Venous thrombosis    H/O    Past Surgical History:  Procedure Laterality Date  . ABDOMINAL AORTOGRAM W/LOWER EXTREMITY Right 11/01/2017   Ultrasound-guided cannulation left common femoral artery  . ABDOMINAL AORTOGRAM W/LOWER EXTREMITY N/A 11/01/2017   Procedure: ABDOMINAL AORTOGRAM W/LOWER EXTREMITY;  Surgeon: Waynetta Sandy, MD;  Location: [facility];  Service: Cardiovascular;  Laterality: N/A;  . LUNG LOBECTOMY  02/23/2006   DR.BURNEY  . REVISION TOTAL HIP ARTHROPLASTY    . TEE WITHOUT CARDIOVERSION N/A 11/10/2017   Procedure:  TRANSESOPHAGEAL ECHOCARDIOGRAM (TEE);  Surgeon: Thayer Headings, MD;  Location: [facility];  Service: Cardiovascular;  Laterality: N/A;  . THORACOTOMY  02/23/2006   DR.BURNEY  . TIBIA FRACTURE SURGERY      Family History  Problem Relation Age of Onset  . Liver cancer Mother   . Lung cancer Father     SOCIAL HISTORY: Social History   Tobacco Use  . Smoking status: Current Every Day Smoker    Packs/day: 1.00    Types: Cigarettes  . Smokeless tobacco: Never Used  Substance Use Topics  . Alcohol use: Yes    Comment: Couple of drinks per day    Allergies  Allergen Reactions  . Aspirin Nausea And Vomiting  . Codeine Nausea And Vomiting  . Ibuprofen Nausea And Vomiting  . Penicillins Rash    Has patient had a PCN reaction causing immediate rash, facial/tongue/throat swelling, SOB or lightheadedness with hypotension: YES Has patient had a PCN reaction causing severe rash involving mucus membranes or skin necrosis: NO Has patient had a PCN reaction that required hospitalization: NO Has patient had a PCN reaction occurring within the last 10 years: YES If all of the above answers are "NO", then may proceed with Cephalosporin use.    Current Outpatient Medications  Medication Sig Dispense Refill  . acetaminophen (TYLENOL) 325 MG tablet Take 2 tablets (650 mg total) by mouth every 6 (six) hours as needed for mild pain (or Fever >/= 101).    Marland Kitchen albuterol (VENTOLIN HFA) 108 (90 Base) MCG/ACT  inhaler Inhale 2 puffs into the lungs every 6 (six) hours as needed.    Marland Kitchen amitriptyline (ELAVIL) 25 MG tablet Take 25 mg by mouth at bedtime.  2  . atorvastatin (LIPITOR) 20 MG tablet Take 20 mg by mouth at bedtime.  5  . busPIRone (BUSPAR) 7.5 MG tablet Take 7.5 mg by mouth 2 (two) times daily.  1  . ceFAZolin (ANCEF) 2-4 GM/100ML-% IVPB Inject 100 mLs (2 g total) into the vein every 8 (eight) hours for 15 days. Continue with antibiotic with stop date 11/26/2017 1 each   . clopidogrel (PLAVIX)  75 MG tablet Take 75 mg by mouth daily.  3  . Cyanocobalamin (B-12 PO) Take 1 tablet by mouth daily.    Marland Kitchen dicyclomine (BENTYL) 20 MG tablet Take 20 mg by mouth 3 (three) times daily as needed for spasms.     . ferrous sulfate 325 (65 FE) MG tablet Take 1 tablet (325 mg total) by mouth daily with breakfast.  3  . gabapentin (NEURONTIN) 100 MG capsule Take 100 mg by mouth 2 (two) times daily.     Marland Kitchen lactobacillus acidophilus & bulgar (LACTINEX) chewable tablet Chew 1 tablet by mouth 3 (three) times daily with meals.    Marland Kitchen loratadine (CLARITIN) 10 MG tablet Take 10 mg by mouth daily.    . metoprolol (LOPRESSOR) 50 MG tablet Take 50 mg by mouth 2 (two) times daily.     . Multiple Minerals-Vitamins (CALCIUM & VIT D3 BONE HEALTH PO) Take 1 tablet by mouth daily.    . Multiple Vitamins-Minerals (ICAPS AREDS 2 PO) Take 2 tablets by mouth daily.    Marland Kitchen oxyCODONE (OXYCONTIN) 15 mg 12 hr tablet Take 1 tablet (15 mg total) by mouth 2 (two) times daily. 10 tablet 0  . Oxycodone HCl 10 MG TABS Take 1 tablet (10 mg total) by mouth every 6 (six) hours as needed (SEVERE PAIN). 10 tablet 0  . pantoprazole (PROTONIX) 40 MG tablet Take 40 mg by mouth daily.     . Potassium 99 MG TABS Take 99 mg by mouth daily.    . sertraline (ZOLOFT) 100 MG tablet Take 100 mg by mouth daily.  3  . sucralfate (CARAFATE) 1 G tablet Take 1 g by mouth 4 (four) times daily -  with meals and at bedtime.     . traZODone (DESYREL) 50 MG tablet Take 50 mg by mouth at bedtime.  2   No current facility-administered medications for this visit.       PHYSICAL EXAM: Vitals:   11/23/17 1529 11/23/17 1532  BP: (!) 148/63 (!) 146/60  Pulse: 84   Resp: 18   SpO2: 100%   Weight: 68.5 kg   Height: 5\' 3"  (1.6 m)     GENERAL: The patient is a well-nourished female, in no acute distress. The vital signs are documented above.  Resting in wheelchair. CARDIAC: There is a regular rate and rhythm.  VASCULAR:  Monophasic right DP signal and  biphasic right PT signal. Right foot is motor intact. Subjective chronic numbness to the right foot but appears baseline. Tiny pressure wound between her first and second toe of the right foot that is scabbed over but overall pretty well-appearing PULMONARY: There is good air exchange bilaterally without wheezing or rales. ABDOMEN: Soft and non-tender with normal pitched bowel sounds.  MUSCULOSKELETAL: There are no major deformities or cyanosis. NEUROLOGIC: No focal weakness or paresthesias are detected. PSYCHIATRIC: The patient has a normal affect.  DATA:  None  Assessment/Plan:  We had a long discussion today with Courtney Grant and her daughter regarding the best course forward.  She has a small pressure wound between her right first and second toe where the toes overlap that has a scab over it and is unchanged since her hospitalization.  Overall this is a very benign appearing wound.  In addition I think most of her symptoms are related to her right knee where she had septic arthritis and hardware removal.  Not convinced that she has new ischemic rest pain symptoms at this time and we previously suspected her graft was a subclinical occlusion.. She really does not want another operation at this time and is undergoing rehab in a nursing facility.  Her only option moving forward would be a right fem to below-knee pop bypass with prosthetic given that she has no usable vein.  We will plan to see her back in 3 months with repeat ABI + TP and asked her daughter to call sooner if issues develop in the meantime.  The patient and her family seem comfortable with this plan moving forward.   Marty Heck, MD [facility] Office: [phone] Pager: [place]

## 2017-11-25 ENCOUNTER — Ambulatory Visit: Payer: Medicare Other | Admitting: Internal Medicine

## 2017-11-29 ENCOUNTER — Ambulatory Visit (INDEPENDENT_AMBULATORY_CARE_PROVIDER_SITE_OTHER): Payer: Medicare Other | Admitting: Internal Medicine

## 2017-11-29 ENCOUNTER — Encounter: Payer: Self-pay | Admitting: Internal Medicine

## 2017-11-29 VITALS — BP 142/75 | HR 75 | Temp 98.7°F | Ht 64.0 in

## 2017-11-29 DIAGNOSIS — B9561 Methicillin susceptible Staphylococcus aureus infection as the cause of diseases classified elsewhere: Secondary | ICD-10-CM

## 2017-11-29 DIAGNOSIS — R7881 Bacteremia: Secondary | ICD-10-CM | POA: Diagnosis not present

## 2017-11-29 DIAGNOSIS — M00061 Staphylococcal arthritis, right knee: Secondary | ICD-10-CM | POA: Diagnosis not present

## 2017-11-29 DIAGNOSIS — T82898S Other specified complication of vascular prosthetic devices, implants and grafts, sequela: Secondary | ICD-10-CM

## 2017-11-29 MED ORDER — CEPHALEXIN 500 MG PO CAPS: 500.0000 mg | ORAL_CAPSULE | Freq: Three times a day (TID) | ORAL | 1 refills | Status: DC

## 2017-11-29 NOTE — Progress Notes (Signed)
RFV: septic arthritis of right knee and HW infection of right thigh, since removed with MSSA infection  Patient ID: Courtney Grant, female   DOB: [birth year], 84 y.o.   MRN: [id number]  HPI Courtney Grant is an 84yo F with who had multiple orthopedic procedures to right leg including rod implants. She was recently admitted to [facility] on 8/15 for septic arthritis to right knee and thigh abscess with HW infection, with plate to femur removed.she was concurrently bacteremic with MSSA. She was found to have  an occlusion fo her right femoral to popliteal vascular graft and transferred to [facility] for further management.  She was placedn on cefazolin. ABI on 10/31/2017 shows moderate to severe reduction in the arterial flow with abnormal TBI's.  Vascular surgery plans no interventions at this time and plans to follow outpatient.Sed rate still in 100s last week. Her right leg has healed. She is looking forward to finishing out her abtx this week  Soc hx: from SNF but going home soon  Outpatient Encounter Medications as of 11/29/2017  Medication Sig  . acetaminophen (TYLENOL) 325 MG tablet Take 2 tablets (650 mg total) by mouth every 6 (six) hours as needed for mild pain (or Fever >/= 101).  Marland Kitchen albuterol (VENTOLIN HFA) 108 (90 Base) MCG/ACT inhaler Inhale 2 puffs into the lungs every 6 (six) hours as needed.  Marland Kitchen amitriptyline (ELAVIL) 25 MG tablet Take 25 mg by mouth at bedtime.  Marland Kitchen atorvastatin (LIPITOR) 20 MG tablet Take 20 mg by mouth at bedtime.  . busPIRone (BUSPAR) 7.5 MG tablet Take 7.5 mg by mouth 2 (two) times daily.  . clopidogrel (PLAVIX) 75 MG tablet Take 75 mg by mouth daily.  . Cyanocobalamin (B-12 PO) Take 1 tablet by mouth daily.  Marland Kitchen dicyclomine (BENTYL) 20 MG tablet Take 20 mg by mouth 3 (three) times daily as needed for spasms.   . ferrous sulfate 325 (65 FE) MG tablet Take 1 tablet (325 mg total) by mouth daily with breakfast.  . gabapentin (NEURONTIN) 100 MG capsule Take 100 mg by mouth 2  (two) times daily.   Marland Kitchen lactobacillus acidophilus & bulgar (LACTINEX) chewable tablet Chew 1 tablet by mouth 3 (three) times daily with meals.  Marland Kitchen loratadine (CLARITIN) 10 MG tablet Take 10 mg by mouth daily.  . metoprolol (LOPRESSOR) 50 MG tablet Take 50 mg by mouth 2 (two) times daily.   . Multiple Minerals-Vitamins (CALCIUM & VIT D3 BONE HEALTH PO) Take 1 tablet by mouth daily.  . Multiple Vitamins-Minerals (ICAPS AREDS 2 PO) Take 2 tablets by mouth daily.  Marland Kitchen oxyCODONE (OXYCONTIN) 15 mg 12 hr tablet Take 1 tablet (15 mg total) by mouth 2 (two) times daily.  . Oxycodone HCl 10 MG TABS Take 1 tablet (10 mg total) by mouth every 6 (six) hours as needed (SEVERE PAIN).  Marland Kitchen pantoprazole (PROTONIX) 40 MG tablet Take 40 mg by mouth daily.   . Potassium 99 MG TABS Take 99 mg by mouth daily.  . sertraline (ZOLOFT) 100 MG tablet Take 100 mg by mouth daily.  . sucralfate (CARAFATE) 1 G tablet Take 1 g by mouth 4 (four) times daily -  with meals and at bedtime.   . traZODone (DESYREL) 50 MG tablet Take 50 mg by mouth at bedtime.   No facility-administered encounter medications on file as of 11/29/2017.      Patient Active Problem List   Diagnosis Date Noted  . Bacteremia due to methicillin susceptible Staphylococcus aureus (MSSA) 10/30/2017  .  Occlusion of right femoral-popliteal bypass graft ([place]) 10/30/2017  . Chronic back pain 10/30/2017  . Normocytic anemia 10/30/2017  . Septic arthritis of knee, right ([place]) 10/30/2017  . History of bleeding peptic ulcer 10/30/2017  . Abnormal transaminases   . Arthritis 01/29/2016  . Biliary dyskinesia 01/29/2016  . Depression 01/29/2016  . Fibromyalgia 01/29/2016  . Hiatal hernia with GERD 01/29/2016  . Hypertension 01/29/2016  . PONV (postoperative nausea and vomiting) 01/29/2016  . History of pulmonary embolism 01/29/2016  . Stroke ([place]) 01/29/2016  . Urgency incontinence 01/29/2016  . Benign paroxysmal positional vertigo 10/25/2015  . COPD (chronic  obstructive pulmonary disease) ([place])   . Tobacco abuse   . Cancer of lung [facility])      Health Maintenance Due  Topic Date Due  . TETANUS/TDAP  10/24/1953  . DEXA SCAN  10/25/1999  . PNA vac Low Risk Adult (1 of 2 - PCV13) 10/25/1999  . INFLUENZA VACCINE  10/14/2017     Review of Systems Weakness, fatigue. Otherwise 12 point ros is negative Physical Exam   BP (!) 142/75   Pulse 75   Temp 98.7 F (37.1 C)   Ht 5\' 4"  (1.626 m)   BMI 25.92 kg/m    Physical Exam  Constitutional:  oriented to person, place, and time. appears chronically ill and well-nourished. No distress.  HENT: [place]/AT, PERRLA, no scleral icterus Mouth/Throat: Oropharynx is clear and moist. No oropharyngeal exudate.  Cardiovascular: Normal rate, regular rhythm and normal heart sounds. Exam reveals no gallop and no friction rub.  No murmur heard.  Pulmonary/Chest: Effort normal and breath sounds normal. No respiratory distress.  has no wheezes.  Neck = supple, no nuchal rigidity Ext: right knee no swelling, healed thigh incision Neurological: alert and oriented to person, place, and time.  Skin: Skin is warm and dry. No rash noted. No erythema.  Psychiatric: a normal mood and affect.  behavior is normal.   CBC Lab Results  Component Value Date   WBC 10.3 11/11/2017   RBC 3.13 (L) 11/11/2017   HGB 8.6 (L) 11/11/2017   HCT 29.3 (L) 11/11/2017   PLT 430 (H) 11/11/2017   MCV 93.6 11/11/2017   MCH 27.5 11/11/2017   MCHC 29.4 (L) 11/11/2017   RDW 15.5 11/11/2017   LYMPHSABS 1.5 10/30/2017   MONOABS 1.1 (H) 10/30/2017   EOSABS 0.1 10/30/2017    BMET Lab Results  Component Value Date   NA 138 11/11/2017   K 3.6 11/11/2017   CL 104 11/11/2017   CO2 26 11/11/2017   GLUCOSE 93 11/11/2017   BUN 7 (L) 11/11/2017   CREATININE 0.63 11/11/2017   CALCIUM 7.9 (L) 11/11/2017   GFRNONAA >60 11/11/2017   GFRAA >60 11/11/2017    Lab Results  Component Value Date   ESRSEDRATE 72 (H) 11/29/2017   Lab Results   Component Value Date   CRP 21.4 (H) 11/29/2017     Assessment and Plan  MSSA septic arthritis, thigh abscess +/- vascular graft infection Will check sed rate, crp, cbc, bmp  Will plan to continue for addn 2 more weeks ( thus 6 wk of treatment) and recheck inflammatory markers. May need to extend further. Though she has finished course of septic arthritis, I wonder that she should have longer course with presumed endovascular infection (clotted vascular graft) and fact that her markers are elevated  Will plan on having her do ceftriaxone 2gm IV Daily x 2 wk for now.

## 2017-11-29 NOTE — Patient Instructions (Signed)
Once we decide that you no longer need IV abtx, please start taking oral abtx, keflex 500mg  TID. We will give you prescription to keep on hand and fill once IV abtx are completed

## 2017-11-30 LAB — BASIC METABOLIC PANEL
BUN/Creatinine Ratio: 25 (calc) — ABNORMAL HIGH (ref 6–22)
BUN: 14 mg/dL (ref 7–25)
CO2: 29 mmol/L (ref 20–32)
Calcium: 9 mg/dL (ref 8.6–10.4)
Chloride: 100 mmol/L (ref 98–110)
Creat: 0.57 mg/dL — ABNORMAL LOW (ref 0.60–0.88)
Glucose, Bld: 87 mg/dL (ref 65–99)
Potassium: 4.1 mmol/L (ref 3.5–5.3)
Sodium: 138 mmol/L (ref 135–146)

## 2017-11-30 LAB — CBC WITH DIFFERENTIAL/PLATELET
Basophils Absolute: 66 cells/uL (ref 0–200)
Basophils Relative: 0.5 %
Eosinophils Absolute: 422 cells/uL (ref 15–500)
Eosinophils Relative: 3.2 %
HCT: 33.8 % — ABNORMAL LOW (ref 35.0–45.0)
Hemoglobin: 11.2 g/dL — ABNORMAL LOW (ref 11.7–15.5)
Lymphs Abs: 2138 cells/uL (ref 850–3900)
MCH: 28.4 pg (ref 27.0–33.0)
MCHC: 33.1 g/dL (ref 32.0–36.0)
MCV: 85.6 fL (ref 80.0–100.0)
MPV: 10.2 fL (ref 7.5–12.5)
Monocytes Relative: 5.9 %
Neutro Abs: 9794 cells/uL — ABNORMAL HIGH (ref 1500–7800)
Neutrophils Relative %: 74.2 %
Platelets: 338 10*3/uL (ref 140–400)
RBC: 3.95 10*6/uL (ref 3.80–5.10)
RDW: 14.6 % (ref 11.0–15.0)
Total Lymphocyte: 16.2 %
WBC mixed population: 779 cells/uL (ref 200–950)
WBC: 13.2 10*3/uL — ABNORMAL HIGH (ref 3.8–10.8)

## 2017-11-30 LAB — C-REACTIVE PROTEIN: CRP: 21.4 mg/L — ABNORMAL HIGH (ref <8.0)

## 2017-11-30 LAB — SEDIMENTATION RATE: Sed Rate: 72 mm/h — ABNORMAL HIGH (ref 0–30)

## 2017-12-01 ENCOUNTER — Telehealth: Payer: Self-pay | Admitting: *Deleted

## 2017-12-01 ENCOUNTER — Telehealth: Payer: Self-pay

## 2017-12-01 NOTE — Telephone Encounter (Signed)
Patient is receiving antibiotics from [facility] in [place]. RN spoke with Almyra Free confirming PICC protocol orders, IV infusion, and lab orders. RN confirmed with Dr Baxter Flattery, faxed signed orders back. Heather: Home health clinic liaison, [phone] [facility]: JQDUK:[phone], fax: [place]: [phone] Landis Gandy, RN

## 2017-12-01 NOTE — Telephone Encounter (Signed)
[facility] calling , patient is being discharged from rehab facility and would like to know who will be following her today.    Laverle Patter, RN

## 2017-12-02 ENCOUNTER — Telehealth: Payer: Self-pay | Admitting: *Deleted

## 2017-12-02 DIAGNOSIS — L02415 Cutaneous abscess of right lower limb: Secondary | ICD-10-CM | POA: Diagnosis not present

## 2017-12-02 DIAGNOSIS — G894 Chronic pain syndrome: Secondary | ICD-10-CM | POA: Diagnosis not present

## 2017-12-02 DIAGNOSIS — I739 Peripheral vascular disease, unspecified: Secondary | ICD-10-CM | POA: Diagnosis not present

## 2017-12-02 DIAGNOSIS — K219 Gastro-esophageal reflux disease without esophagitis: Secondary | ICD-10-CM | POA: Diagnosis not present

## 2017-12-02 DIAGNOSIS — A4901 Methicillin susceptible Staphylococcus aureus infection, unspecified site: Secondary | ICD-10-CM | POA: Diagnosis not present

## 2017-12-02 DIAGNOSIS — E7800 Pure hypercholesterolemia, unspecified: Secondary | ICD-10-CM | POA: Diagnosis not present

## 2017-12-02 DIAGNOSIS — Z86711 Personal history of pulmonary embolism: Secondary | ICD-10-CM | POA: Diagnosis not present

## 2017-12-02 DIAGNOSIS — Z85118 Personal history of other malignant neoplasm of bronchus and lung: Secondary | ICD-10-CM | POA: Diagnosis not present

## 2017-12-02 DIAGNOSIS — Z8673 Personal history of transient ischemic attack (TIA), and cerebral infarction without residual deficits: Secondary | ICD-10-CM | POA: Diagnosis not present

## 2017-12-02 DIAGNOSIS — J449 Chronic obstructive pulmonary disease, unspecified: Secondary | ICD-10-CM | POA: Diagnosis not present

## 2017-12-02 DIAGNOSIS — M00861 Arthritis due to other bacteria, right knee: Secondary | ICD-10-CM | POA: Diagnosis not present

## 2017-12-02 DIAGNOSIS — I10 Essential (primary) hypertension: Secondary | ICD-10-CM | POA: Diagnosis not present

## 2017-12-02 DIAGNOSIS — Z79891 Long term (current) use of opiate analgesic: Secondary | ICD-10-CM | POA: Diagnosis not present

## 2017-12-02 DIAGNOSIS — I4891 Unspecified atrial fibrillation: Secondary | ICD-10-CM | POA: Diagnosis not present

## 2017-12-02 DIAGNOSIS — Z452 Encounter for adjustment and management of vascular access device: Secondary | ICD-10-CM | POA: Diagnosis not present

## 2017-12-02 DIAGNOSIS — M1991 Primary osteoarthritis, unspecified site: Secondary | ICD-10-CM | POA: Diagnosis not present

## 2017-12-02 DIAGNOSIS — M009 Pyogenic arthritis, unspecified: Secondary | ICD-10-CM | POA: Diagnosis not present

## 2017-12-02 DIAGNOSIS — Z86718 Personal history of other venous thrombosis and embolism: Secondary | ICD-10-CM | POA: Diagnosis not present

## 2017-12-02 DIAGNOSIS — K769 Liver disease, unspecified: Secondary | ICD-10-CM | POA: Diagnosis not present

## 2017-12-02 DIAGNOSIS — B9561 Methicillin susceptible Staphylococcus aureus infection as the cause of diseases classified elsewhere: Secondary | ICD-10-CM | POA: Diagnosis not present

## 2017-12-02 DIAGNOSIS — D509 Iron deficiency anemia, unspecified: Secondary | ICD-10-CM | POA: Diagnosis not present

## 2017-12-02 DIAGNOSIS — T8453XD Infection and inflammatory reaction due to internal right knee prosthesis, subsequent encounter: Secondary | ICD-10-CM | POA: Diagnosis not present

## 2017-12-02 NOTE — Telephone Encounter (Signed)
Courtney Grant with home health taking care to the patient advised her PICC is out about 9 cm since coming home. She called the facility as they did a xray for placement but will not sent her the results. She asked them to send them here. Advised we have not gotten them yet but will make sure Dr Baxter Flattery gets them once they get here. She also advised she can still infuse and draw labs but wants to know if she should or of the patient needs to have the line replaced.  She also wants to know if we will sign orders for PT. Advised will ask the provider and call her once she responds.

## 2017-12-04 DIAGNOSIS — M1991 Primary osteoarthritis, unspecified site: Secondary | ICD-10-CM | POA: Diagnosis not present

## 2017-12-04 DIAGNOSIS — K219 Gastro-esophageal reflux disease without esophagitis: Secondary | ICD-10-CM | POA: Diagnosis not present

## 2017-12-04 DIAGNOSIS — Z86711 Personal history of pulmonary embolism: Secondary | ICD-10-CM | POA: Diagnosis not present

## 2017-12-04 DIAGNOSIS — G894 Chronic pain syndrome: Secondary | ICD-10-CM | POA: Diagnosis not present

## 2017-12-04 DIAGNOSIS — I4891 Unspecified atrial fibrillation: Secondary | ICD-10-CM | POA: Diagnosis not present

## 2017-12-04 DIAGNOSIS — E7800 Pure hypercholesterolemia, unspecified: Secondary | ICD-10-CM | POA: Diagnosis not present

## 2017-12-04 DIAGNOSIS — Z86718 Personal history of other venous thrombosis and embolism: Secondary | ICD-10-CM | POA: Diagnosis not present

## 2017-12-04 DIAGNOSIS — I739 Peripheral vascular disease, unspecified: Secondary | ICD-10-CM | POA: Diagnosis not present

## 2017-12-04 DIAGNOSIS — K769 Liver disease, unspecified: Secondary | ICD-10-CM | POA: Diagnosis not present

## 2017-12-04 DIAGNOSIS — Z79891 Long term (current) use of opiate analgesic: Secondary | ICD-10-CM | POA: Diagnosis not present

## 2017-12-04 DIAGNOSIS — I10 Essential (primary) hypertension: Secondary | ICD-10-CM | POA: Diagnosis not present

## 2017-12-04 DIAGNOSIS — Z8673 Personal history of transient ischemic attack (TIA), and cerebral infarction without residual deficits: Secondary | ICD-10-CM | POA: Diagnosis not present

## 2017-12-04 DIAGNOSIS — B9561 Methicillin susceptible Staphylococcus aureus infection as the cause of diseases classified elsewhere: Secondary | ICD-10-CM | POA: Diagnosis not present

## 2017-12-04 DIAGNOSIS — J449 Chronic obstructive pulmonary disease, unspecified: Secondary | ICD-10-CM | POA: Diagnosis not present

## 2017-12-04 DIAGNOSIS — L02415 Cutaneous abscess of right lower limb: Secondary | ICD-10-CM | POA: Diagnosis not present

## 2017-12-04 DIAGNOSIS — D509 Iron deficiency anemia, unspecified: Secondary | ICD-10-CM | POA: Diagnosis not present

## 2017-12-04 DIAGNOSIS — Z85118 Personal history of other malignant neoplasm of bronchus and lung: Secondary | ICD-10-CM | POA: Diagnosis not present

## 2017-12-04 DIAGNOSIS — Z452 Encounter for adjustment and management of vascular access device: Secondary | ICD-10-CM | POA: Diagnosis not present

## 2017-12-06 DIAGNOSIS — B9561 Methicillin susceptible Staphylococcus aureus infection as the cause of diseases classified elsewhere: Secondary | ICD-10-CM | POA: Diagnosis not present

## 2017-12-06 DIAGNOSIS — I4891 Unspecified atrial fibrillation: Secondary | ICD-10-CM | POA: Diagnosis not present

## 2017-12-06 DIAGNOSIS — L02415 Cutaneous abscess of right lower limb: Secondary | ICD-10-CM | POA: Diagnosis not present

## 2017-12-06 DIAGNOSIS — M84374D Stress fracture, right foot, subsequent encounter for fracture with routine healing: Secondary | ICD-10-CM | POA: Diagnosis not present

## 2017-12-06 DIAGNOSIS — Z85118 Personal history of other malignant neoplasm of bronchus and lung: Secondary | ICD-10-CM | POA: Diagnosis not present

## 2017-12-06 DIAGNOSIS — Z79891 Long term (current) use of opiate analgesic: Secondary | ICD-10-CM | POA: Diagnosis not present

## 2017-12-06 DIAGNOSIS — J449 Chronic obstructive pulmonary disease, unspecified: Secondary | ICD-10-CM | POA: Diagnosis not present

## 2017-12-06 DIAGNOSIS — M1991 Primary osteoarthritis, unspecified site: Secondary | ICD-10-CM | POA: Diagnosis not present

## 2017-12-06 DIAGNOSIS — Z86718 Personal history of other venous thrombosis and embolism: Secondary | ICD-10-CM | POA: Diagnosis not present

## 2017-12-06 DIAGNOSIS — Z86711 Personal history of pulmonary embolism: Secondary | ICD-10-CM | POA: Diagnosis not present

## 2017-12-06 DIAGNOSIS — G894 Chronic pain syndrome: Secondary | ICD-10-CM | POA: Diagnosis not present

## 2017-12-06 DIAGNOSIS — Z8673 Personal history of transient ischemic attack (TIA), and cerebral infarction without residual deficits: Secondary | ICD-10-CM | POA: Diagnosis not present

## 2017-12-06 DIAGNOSIS — I739 Peripheral vascular disease, unspecified: Secondary | ICD-10-CM | POA: Diagnosis not present

## 2017-12-06 DIAGNOSIS — K219 Gastro-esophageal reflux disease without esophagitis: Secondary | ICD-10-CM | POA: Diagnosis not present

## 2017-12-06 DIAGNOSIS — K769 Liver disease, unspecified: Secondary | ICD-10-CM | POA: Diagnosis not present

## 2017-12-06 DIAGNOSIS — Z452 Encounter for adjustment and management of vascular access device: Secondary | ICD-10-CM | POA: Diagnosis not present

## 2017-12-06 DIAGNOSIS — E7800 Pure hypercholesterolemia, unspecified: Secondary | ICD-10-CM | POA: Diagnosis not present

## 2017-12-06 DIAGNOSIS — I10 Essential (primary) hypertension: Secondary | ICD-10-CM | POA: Diagnosis not present

## 2017-12-06 DIAGNOSIS — Z48812 Encounter for surgical aftercare following surgery on the circulatory system: Secondary | ICD-10-CM | POA: Diagnosis not present

## 2017-12-06 DIAGNOSIS — D509 Iron deficiency anemia, unspecified: Secondary | ICD-10-CM | POA: Diagnosis not present

## 2017-12-08 DIAGNOSIS — G894 Chronic pain syndrome: Secondary | ICD-10-CM | POA: Diagnosis not present

## 2017-12-08 DIAGNOSIS — G8918 Other acute postprocedural pain: Secondary | ICD-10-CM | POA: Diagnosis not present

## 2017-12-08 DIAGNOSIS — M545 Low back pain: Secondary | ICD-10-CM | POA: Diagnosis not present

## 2017-12-08 DIAGNOSIS — Z79899 Other long term (current) drug therapy: Secondary | ICD-10-CM | POA: Diagnosis not present

## 2017-12-08 DIAGNOSIS — Z720 Tobacco use: Secondary | ICD-10-CM | POA: Diagnosis not present

## 2017-12-09 DIAGNOSIS — A4901 Methicillin susceptible Staphylococcus aureus infection, unspecified site: Secondary | ICD-10-CM | POA: Diagnosis not present

## 2017-12-09 DIAGNOSIS — T8453XD Infection and inflammatory reaction due to internal right knee prosthesis, subsequent encounter: Secondary | ICD-10-CM | POA: Diagnosis not present

## 2017-12-09 DIAGNOSIS — M00861 Arthritis due to other bacteria, right knee: Secondary | ICD-10-CM | POA: Diagnosis not present

## 2017-12-13 ENCOUNTER — Other Ambulatory Visit: Payer: Self-pay

## 2017-12-13 DIAGNOSIS — B9561 Methicillin susceptible Staphylococcus aureus infection as the cause of diseases classified elsewhere: Secondary | ICD-10-CM | POA: Diagnosis not present

## 2017-12-13 DIAGNOSIS — I4891 Unspecified atrial fibrillation: Secondary | ICD-10-CM | POA: Diagnosis not present

## 2017-12-13 DIAGNOSIS — K219 Gastro-esophageal reflux disease without esophagitis: Secondary | ICD-10-CM | POA: Diagnosis not present

## 2017-12-13 DIAGNOSIS — M1991 Primary osteoarthritis, unspecified site: Secondary | ICD-10-CM | POA: Diagnosis not present

## 2017-12-13 DIAGNOSIS — K769 Liver disease, unspecified: Secondary | ICD-10-CM | POA: Diagnosis not present

## 2017-12-13 DIAGNOSIS — I739 Peripheral vascular disease, unspecified: Secondary | ICD-10-CM | POA: Diagnosis not present

## 2017-12-13 DIAGNOSIS — D509 Iron deficiency anemia, unspecified: Secondary | ICD-10-CM | POA: Diagnosis not present

## 2017-12-13 DIAGNOSIS — Z85118 Personal history of other malignant neoplasm of bronchus and lung: Secondary | ICD-10-CM | POA: Diagnosis not present

## 2017-12-13 DIAGNOSIS — Z86718 Personal history of other venous thrombosis and embolism: Secondary | ICD-10-CM | POA: Diagnosis not present

## 2017-12-13 DIAGNOSIS — I10 Essential (primary) hypertension: Secondary | ICD-10-CM | POA: Diagnosis not present

## 2017-12-13 DIAGNOSIS — L02415 Cutaneous abscess of right lower limb: Secondary | ICD-10-CM | POA: Diagnosis not present

## 2017-12-13 DIAGNOSIS — G894 Chronic pain syndrome: Secondary | ICD-10-CM | POA: Diagnosis not present

## 2017-12-13 DIAGNOSIS — E7800 Pure hypercholesterolemia, unspecified: Secondary | ICD-10-CM | POA: Diagnosis not present

## 2017-12-13 DIAGNOSIS — Z452 Encounter for adjustment and management of vascular access device: Secondary | ICD-10-CM | POA: Diagnosis not present

## 2017-12-13 DIAGNOSIS — Z86711 Personal history of pulmonary embolism: Secondary | ICD-10-CM | POA: Diagnosis not present

## 2017-12-13 DIAGNOSIS — J449 Chronic obstructive pulmonary disease, unspecified: Secondary | ICD-10-CM | POA: Diagnosis not present

## 2017-12-13 DIAGNOSIS — Z8673 Personal history of transient ischemic attack (TIA), and cerebral infarction without residual deficits: Secondary | ICD-10-CM | POA: Diagnosis not present

## 2017-12-13 DIAGNOSIS — Z79891 Long term (current) use of opiate analgesic: Secondary | ICD-10-CM | POA: Diagnosis not present

## 2017-12-13 NOTE — Patient Outreach (Signed)
[facility]) Care Management  12/13/2017  [name] [birth year] [id number]   Medication Adherence call to Mr. Courtney Grant spoke with patients daughter she said patient has been in the hospital and at a nursing home and they have provide it with all her medication patient is due on Atorvastatin 20 mg under [place] [employer] care Ins.  [facility] Management Direct Dial [phone]  Fax [phone] [email or web address]

## 2017-12-16 DIAGNOSIS — D509 Iron deficiency anemia, unspecified: Secondary | ICD-10-CM | POA: Diagnosis not present

## 2017-12-16 DIAGNOSIS — K219 Gastro-esophageal reflux disease without esophagitis: Secondary | ICD-10-CM | POA: Diagnosis not present

## 2017-12-16 DIAGNOSIS — J449 Chronic obstructive pulmonary disease, unspecified: Secondary | ICD-10-CM | POA: Diagnosis not present

## 2017-12-16 DIAGNOSIS — L02415 Cutaneous abscess of right lower limb: Secondary | ICD-10-CM | POA: Diagnosis not present

## 2017-12-16 DIAGNOSIS — I4891 Unspecified atrial fibrillation: Secondary | ICD-10-CM | POA: Diagnosis not present

## 2017-12-16 DIAGNOSIS — I10 Essential (primary) hypertension: Secondary | ICD-10-CM | POA: Diagnosis not present

## 2017-12-16 DIAGNOSIS — E7800 Pure hypercholesterolemia, unspecified: Secondary | ICD-10-CM | POA: Diagnosis not present

## 2017-12-16 DIAGNOSIS — Z452 Encounter for adjustment and management of vascular access device: Secondary | ICD-10-CM | POA: Diagnosis not present

## 2017-12-16 DIAGNOSIS — Z86711 Personal history of pulmonary embolism: Secondary | ICD-10-CM | POA: Diagnosis not present

## 2017-12-16 DIAGNOSIS — Z85118 Personal history of other malignant neoplasm of bronchus and lung: Secondary | ICD-10-CM | POA: Diagnosis not present

## 2017-12-16 DIAGNOSIS — M1991 Primary osteoarthritis, unspecified site: Secondary | ICD-10-CM | POA: Diagnosis not present

## 2017-12-16 DIAGNOSIS — Z79891 Long term (current) use of opiate analgesic: Secondary | ICD-10-CM | POA: Diagnosis not present

## 2017-12-16 DIAGNOSIS — K769 Liver disease, unspecified: Secondary | ICD-10-CM | POA: Diagnosis not present

## 2017-12-16 DIAGNOSIS — B9561 Methicillin susceptible Staphylococcus aureus infection as the cause of diseases classified elsewhere: Secondary | ICD-10-CM | POA: Diagnosis not present

## 2017-12-16 DIAGNOSIS — G894 Chronic pain syndrome: Secondary | ICD-10-CM | POA: Diagnosis not present

## 2017-12-16 DIAGNOSIS — Z8673 Personal history of transient ischemic attack (TIA), and cerebral infarction without residual deficits: Secondary | ICD-10-CM | POA: Diagnosis not present

## 2017-12-16 DIAGNOSIS — I739 Peripheral vascular disease, unspecified: Secondary | ICD-10-CM | POA: Diagnosis not present

## 2017-12-16 DIAGNOSIS — Z86718 Personal history of other venous thrombosis and embolism: Secondary | ICD-10-CM | POA: Diagnosis not present

## 2017-12-17 DIAGNOSIS — R42 Dizziness and giddiness: Secondary | ICD-10-CM | POA: Diagnosis not present

## 2017-12-17 DIAGNOSIS — Z23 Encounter for immunization: Secondary | ICD-10-CM | POA: Diagnosis not present

## 2017-12-17 DIAGNOSIS — I10 Essential (primary) hypertension: Secondary | ICD-10-CM | POA: Diagnosis not present

## 2017-12-20 DIAGNOSIS — Z452 Encounter for adjustment and management of vascular access device: Secondary | ICD-10-CM | POA: Diagnosis not present

## 2017-12-20 DIAGNOSIS — Z8673 Personal history of transient ischemic attack (TIA), and cerebral infarction without residual deficits: Secondary | ICD-10-CM | POA: Diagnosis not present

## 2017-12-20 DIAGNOSIS — Z86718 Personal history of other venous thrombosis and embolism: Secondary | ICD-10-CM | POA: Diagnosis not present

## 2017-12-20 DIAGNOSIS — Z86711 Personal history of pulmonary embolism: Secondary | ICD-10-CM | POA: Diagnosis not present

## 2017-12-20 DIAGNOSIS — I739 Peripheral vascular disease, unspecified: Secondary | ICD-10-CM | POA: Diagnosis not present

## 2017-12-20 DIAGNOSIS — I4891 Unspecified atrial fibrillation: Secondary | ICD-10-CM | POA: Diagnosis not present

## 2017-12-20 DIAGNOSIS — J449 Chronic obstructive pulmonary disease, unspecified: Secondary | ICD-10-CM | POA: Diagnosis not present

## 2017-12-20 DIAGNOSIS — G894 Chronic pain syndrome: Secondary | ICD-10-CM | POA: Diagnosis not present

## 2017-12-20 DIAGNOSIS — B9561 Methicillin susceptible Staphylococcus aureus infection as the cause of diseases classified elsewhere: Secondary | ICD-10-CM | POA: Diagnosis not present

## 2017-12-20 DIAGNOSIS — M1991 Primary osteoarthritis, unspecified site: Secondary | ICD-10-CM | POA: Diagnosis not present

## 2017-12-20 DIAGNOSIS — I10 Essential (primary) hypertension: Secondary | ICD-10-CM | POA: Diagnosis not present

## 2017-12-20 DIAGNOSIS — L02415 Cutaneous abscess of right lower limb: Secondary | ICD-10-CM | POA: Diagnosis not present

## 2017-12-20 DIAGNOSIS — Z79891 Long term (current) use of opiate analgesic: Secondary | ICD-10-CM | POA: Diagnosis not present

## 2017-12-20 DIAGNOSIS — E7800 Pure hypercholesterolemia, unspecified: Secondary | ICD-10-CM | POA: Diagnosis not present

## 2017-12-20 DIAGNOSIS — Z85118 Personal history of other malignant neoplasm of bronchus and lung: Secondary | ICD-10-CM | POA: Diagnosis not present

## 2017-12-20 DIAGNOSIS — K219 Gastro-esophageal reflux disease without esophagitis: Secondary | ICD-10-CM | POA: Diagnosis not present

## 2017-12-20 DIAGNOSIS — K769 Liver disease, unspecified: Secondary | ICD-10-CM | POA: Diagnosis not present

## 2017-12-20 DIAGNOSIS — D509 Iron deficiency anemia, unspecified: Secondary | ICD-10-CM | POA: Diagnosis not present

## 2017-12-21 DIAGNOSIS — E7800 Pure hypercholesterolemia, unspecified: Secondary | ICD-10-CM | POA: Diagnosis not present

## 2017-12-21 DIAGNOSIS — B9561 Methicillin susceptible Staphylococcus aureus infection as the cause of diseases classified elsewhere: Secondary | ICD-10-CM | POA: Diagnosis not present

## 2017-12-21 DIAGNOSIS — L02415 Cutaneous abscess of right lower limb: Secondary | ICD-10-CM | POA: Diagnosis not present

## 2017-12-21 DIAGNOSIS — D509 Iron deficiency anemia, unspecified: Secondary | ICD-10-CM | POA: Diagnosis not present

## 2017-12-21 DIAGNOSIS — Z86718 Personal history of other venous thrombosis and embolism: Secondary | ICD-10-CM | POA: Diagnosis not present

## 2017-12-21 DIAGNOSIS — Z79891 Long term (current) use of opiate analgesic: Secondary | ICD-10-CM | POA: Diagnosis not present

## 2017-12-21 DIAGNOSIS — Z8673 Personal history of transient ischemic attack (TIA), and cerebral infarction without residual deficits: Secondary | ICD-10-CM | POA: Diagnosis not present

## 2017-12-21 DIAGNOSIS — I739 Peripheral vascular disease, unspecified: Secondary | ICD-10-CM | POA: Diagnosis not present

## 2017-12-21 DIAGNOSIS — K219 Gastro-esophageal reflux disease without esophagitis: Secondary | ICD-10-CM | POA: Diagnosis not present

## 2017-12-21 DIAGNOSIS — Z452 Encounter for adjustment and management of vascular access device: Secondary | ICD-10-CM | POA: Diagnosis not present

## 2017-12-21 DIAGNOSIS — K769 Liver disease, unspecified: Secondary | ICD-10-CM | POA: Diagnosis not present

## 2017-12-21 DIAGNOSIS — M1991 Primary osteoarthritis, unspecified site: Secondary | ICD-10-CM | POA: Diagnosis not present

## 2017-12-21 DIAGNOSIS — I4891 Unspecified atrial fibrillation: Secondary | ICD-10-CM | POA: Diagnosis not present

## 2017-12-21 DIAGNOSIS — G894 Chronic pain syndrome: Secondary | ICD-10-CM | POA: Diagnosis not present

## 2017-12-21 DIAGNOSIS — J449 Chronic obstructive pulmonary disease, unspecified: Secondary | ICD-10-CM | POA: Diagnosis not present

## 2017-12-21 DIAGNOSIS — I10 Essential (primary) hypertension: Secondary | ICD-10-CM | POA: Diagnosis not present

## 2017-12-21 DIAGNOSIS — Z85118 Personal history of other malignant neoplasm of bronchus and lung: Secondary | ICD-10-CM | POA: Diagnosis not present

## 2017-12-21 DIAGNOSIS — Z86711 Personal history of pulmonary embolism: Secondary | ICD-10-CM | POA: Diagnosis not present

## 2017-12-24 DIAGNOSIS — K769 Liver disease, unspecified: Secondary | ICD-10-CM | POA: Diagnosis not present

## 2017-12-24 DIAGNOSIS — I739 Peripheral vascular disease, unspecified: Secondary | ICD-10-CM | POA: Diagnosis not present

## 2017-12-24 DIAGNOSIS — G894 Chronic pain syndrome: Secondary | ICD-10-CM | POA: Diagnosis not present

## 2017-12-24 DIAGNOSIS — Z8673 Personal history of transient ischemic attack (TIA), and cerebral infarction without residual deficits: Secondary | ICD-10-CM | POA: Diagnosis not present

## 2017-12-24 DIAGNOSIS — B9561 Methicillin susceptible Staphylococcus aureus infection as the cause of diseases classified elsewhere: Secondary | ICD-10-CM | POA: Diagnosis not present

## 2017-12-24 DIAGNOSIS — Z86711 Personal history of pulmonary embolism: Secondary | ICD-10-CM | POA: Diagnosis not present

## 2017-12-24 DIAGNOSIS — M009 Pyogenic arthritis, unspecified: Secondary | ICD-10-CM | POA: Diagnosis not present

## 2017-12-24 DIAGNOSIS — I10 Essential (primary) hypertension: Secondary | ICD-10-CM | POA: Diagnosis not present

## 2017-12-24 DIAGNOSIS — Z452 Encounter for adjustment and management of vascular access device: Secondary | ICD-10-CM | POA: Diagnosis not present

## 2017-12-24 DIAGNOSIS — Z86718 Personal history of other venous thrombosis and embolism: Secondary | ICD-10-CM | POA: Diagnosis not present

## 2017-12-24 DIAGNOSIS — L02415 Cutaneous abscess of right lower limb: Secondary | ICD-10-CM | POA: Diagnosis not present

## 2017-12-24 DIAGNOSIS — D509 Iron deficiency anemia, unspecified: Secondary | ICD-10-CM | POA: Diagnosis not present

## 2017-12-24 DIAGNOSIS — Z79891 Long term (current) use of opiate analgesic: Secondary | ICD-10-CM | POA: Diagnosis not present

## 2017-12-24 DIAGNOSIS — I4891 Unspecified atrial fibrillation: Secondary | ICD-10-CM | POA: Diagnosis not present

## 2017-12-24 DIAGNOSIS — K219 Gastro-esophageal reflux disease without esophagitis: Secondary | ICD-10-CM | POA: Diagnosis not present

## 2017-12-24 DIAGNOSIS — Z85118 Personal history of other malignant neoplasm of bronchus and lung: Secondary | ICD-10-CM | POA: Diagnosis not present

## 2017-12-24 DIAGNOSIS — M1991 Primary osteoarthritis, unspecified site: Secondary | ICD-10-CM | POA: Diagnosis not present

## 2017-12-24 DIAGNOSIS — J449 Chronic obstructive pulmonary disease, unspecified: Secondary | ICD-10-CM | POA: Diagnosis not present

## 2017-12-24 DIAGNOSIS — E7800 Pure hypercholesterolemia, unspecified: Secondary | ICD-10-CM | POA: Diagnosis not present

## 2017-12-27 ENCOUNTER — Encounter: Payer: Self-pay | Admitting: Internal Medicine

## 2017-12-27 ENCOUNTER — Ambulatory Visit (INDEPENDENT_AMBULATORY_CARE_PROVIDER_SITE_OTHER): Payer: Medicare Other | Admitting: Internal Medicine

## 2017-12-27 VITALS — BP 139/70 | HR 71 | Temp 98.3°F | Ht 63.0 in | Wt 147.0 lb

## 2017-12-27 DIAGNOSIS — M00061 Staphylococcal arthritis, right knee: Secondary | ICD-10-CM | POA: Diagnosis not present

## 2017-12-27 DIAGNOSIS — Z79899 Other long term (current) drug therapy: Secondary | ICD-10-CM | POA: Diagnosis not present

## 2017-12-27 MED ORDER — FLUCONAZOLE 150 MG PO TABS: 150.0000 mg | ORAL_TABLET | Freq: Every day | ORAL | 0 refills | Status: DC

## 2017-12-27 MED ORDER — CEPHALEXIN 500 MG PO CAPS: 500.0000 mg | ORAL_CAPSULE | Freq: Three times a day (TID) | ORAL | 1 refills | Status: DC

## 2017-12-27 NOTE — Progress Notes (Signed)
RFV: complicated septic arthritis  Patient ID: Courtney Grant, female   DOB: 1933-02-13, 84 y.o.   MRN: [id number]  HPI RFV: septic arthritis of right knee and HW infection of right thigh, since removed with MSSA infection  Patient ID: Courtney Grant, female   DOB: February 26, 1933, 84 y.o.   MRN: [id number]  HPI Courtney Grant is an 84yo F with who had multiple orthopedic procedures to right leg including rod implants. She was recently admitted to [facility] on 8/15 for septic arthritis to right knee and thigh abscess with HW infection, with plate to femur removed.she was concurrently bacteremic with MSSA. She was found to have  an occlusion fo her right femoral to popliteal vascular graft and transferred to [facility] for further management.  She was placedn on cefazolin. ABI on 10/31/2017 shows moderate to severe reduction in the arterial flow with abnormal TBI's. she was treated with 8 wks of IV abtx.finished at end of September, then switched to cephalexin  Sed rate 72 crp normal wbc nomral per family member. Per dr Edwin Dada.  Outpatient Encounter Medications as of 12/27/2017  Medication Sig  . acetaminophen (TYLENOL) 325 MG tablet Take 2 tablets (650 mg total) by mouth every 6 (six) hours as needed for mild pain (or Fever >/= 101).  Marland Kitchen albuterol (VENTOLIN HFA) 108 (90 Base) MCG/ACT inhaler Inhale 2 puffs into the lungs every 6 (six) hours as needed.  Marland Kitchen amitriptyline (ELAVIL) 25 MG tablet Take 25 mg by mouth at bedtime.  Marland Kitchen atorvastatin (LIPITOR) 20 MG tablet Take 20 mg by mouth at bedtime.  . busPIRone (BUSPAR) 7.5 MG tablet Take 7.5 mg by mouth 2 (two) times daily.  . cephALEXin (KEFLEX) 500 MG capsule Take 1 capsule (500 mg total) by mouth 3 (three) times daily.  . clopidogrel (PLAVIX) 75 MG tablet Take 75 mg by mouth daily.  . Cyanocobalamin (B-12 PO) Take 1 tablet by mouth daily.  Marland Kitchen dicyclomine (BENTYL) 20 MG tablet Take 20 mg by mouth 3 (three) times daily as needed for spasms.   . ferrous  sulfate 325 (65 FE) MG tablet Take 1 tablet (325 mg total) by mouth daily with breakfast.  . gabapentin (NEURONTIN) 100 MG capsule Take 100 mg by mouth 2 (two) times daily.   Marland Kitchen lactobacillus acidophilus & bulgar (LACTINEX) chewable tablet Chew 1 tablet by mouth 3 (three) times daily with meals.  Marland Kitchen loratadine (CLARITIN) 10 MG tablet Take 10 mg by mouth daily.  . metoprolol (LOPRESSOR) 50 MG tablet Take 50 mg by mouth 2 (two) times daily.   . Multiple Minerals-Vitamins (CALCIUM & VIT D3 BONE HEALTH PO) Take 1 tablet by mouth daily.  . Multiple Vitamins-Minerals (ICAPS AREDS 2 PO) Take 2 tablets by mouth daily.  Marland Kitchen oxyCODONE (OXYCONTIN) 15 mg 12 hr tablet Take 1 tablet (15 mg total) by mouth 2 (two) times daily.  . Oxycodone HCl 10 MG TABS Take 1 tablet (10 mg total) by mouth every 6 (six) hours as needed (SEVERE PAIN).  Marland Kitchen pantoprazole (PROTONIX) 40 MG tablet Take 40 mg by mouth daily.   . Potassium 99 MG TABS Take 99 mg by mouth daily.  . sertraline (ZOLOFT) 100 MG tablet Take 100 mg by mouth daily.  . sucralfate (CARAFATE) 1 G tablet Take 1 g by mouth 4 (four) times daily -  with meals and at bedtime.   . traZODone (DESYREL) 50 MG tablet Take 50 mg by mouth at bedtime.   No facility-administered encounter medications on file as  of 12/27/2017.      Patient Active Problem List   Diagnosis Date Noted  . Bacteremia due to methicillin susceptible Staphylococcus aureus (MSSA) 10/30/2017  . Occlusion of right femoral-popliteal bypass graft ([place]) 10/30/2017  . Chronic back pain 10/30/2017  . Normocytic anemia 10/30/2017  . Septic arthritis of knee, right ([place]) 10/30/2017  . History of bleeding peptic ulcer 10/30/2017  . Abnormal transaminases   . Arthritis 01/29/2016  . Biliary dyskinesia 01/29/2016  . Depression 01/29/2016  . Fibromyalgia 01/29/2016  . Hiatal hernia with GERD 01/29/2016  . Hypertension 01/29/2016  . PONV (postoperative nausea and vomiting) 01/29/2016  . History of pulmonary  embolism 01/29/2016  . Stroke ([place]) 01/29/2016  . Urgency incontinence 01/29/2016  . Benign paroxysmal positional vertigo 10/25/2015  . COPD (chronic obstructive pulmonary disease) ([place])   . Tobacco abuse   . Cancer of lung [facility])      Health Maintenance Due  Topic Date Due  . TETANUS/TDAP  10/24/1953  . DEXA SCAN  10/25/1999  . PNA vac Low Risk Adult (1 of 2 - PCV13) 10/25/1999  . INFLUENZA VACCINE  10/14/2017     Review of Systems Review of Systems  Constitutional: Negative for fever, chills, diaphoresis, activity change, appetite change, fatigue and unexpected weight change.  HENT: Negative for congestion, sore throat, rhinorrhea, sneezing, trouble swallowing and sinus pressure.  Eyes: Negative for photophobia and visual disturbance.  Respiratory: Negative for cough, chest tightness, shortness of breath, wheezing and stridor.  Cardiovascular: Negative for chest pain, palpitations and leg swelling.  Gastrointestinal: Negative for nausea, vomiting, abdominal pain, diarrhea, constipation, blood in stool, abdominal distention and anal bleeding.  Genitourinary: Negative for dysuria, hematuria, flank pain and difficulty urinating.  Musculoskeletal: Negative for myalgias, back pain, joint swelling, arthralgias and gait problem.  Skin: Negative for color change, pallor, rash and wound.  Neurological: Negative for dizziness, tremors, weakness and light-headedness.  Hematological: Negative for adenopathy. Does not bruise/bleed easily.  Psychiatric/Behavioral: Negative for behavioral problems, confusion, sleep disturbance, dysphoric mood, decreased concentration and agitation.    Physical Exam   Ht 5\' 3"  (1.6 m)   Wt 147 lb (66.7 kg)   BMI 26.04 kg/m   Physical Exam  Constitutional:  oriented to person, place, and time. appears well-developed and well-nourished. No distress.  HENT: [place]/AT, PERRLA, no scleral icterus Mouth/Throat: Oropharynx is clear and moist. No oropharyngeal  exudate.  Cardiovascular: Normal rate, regular rhythm and normal heart sounds. Exam reveals no gallop and no friction rub.  No murmur heard.  Pulmonary/Chest: Effort normal and breath sounds normal. No respiratory distress.  has no wheezes.  Neck = supple, no nuchal rigidity Abdominal: Soft. Bowel sounds are normal.  exhibits no distension. There is no tenderness.  Lymphadenopathy: no cervical adenopathy. No axillary adenopathy Neurological: alert and oriented to person, place, and time.  Skin: Skin is warm and dry. No rash noted. No erythema.  Psychiatric: a normal mood and affect.  behavior is normal.   CBC Lab Results  Component Value Date   WBC 13.2 (H) 11/29/2017   RBC 3.95 11/29/2017   HGB 11.2 (L) 11/29/2017   HCT 33.8 (L) 11/29/2017   PLT 338 11/29/2017   MCV 85.6 11/29/2017   MCH 28.4 11/29/2017   MCHC 33.1 11/29/2017   RDW 14.6 11/29/2017   LYMPHSABS 2,138 11/29/2017   MONOABS 1.1 (H) 10/30/2017   EOSABS 422 11/29/2017    BMET Lab Results  Component Value Date   NA 138 11/29/2017   K 4.1 11/29/2017  CL 100 11/29/2017   CO2 29 11/29/2017   GLUCOSE 87 11/29/2017   BUN 14 11/29/2017   CREATININE 0.57 (L) 11/29/2017   CALCIUM 9.0 11/29/2017   GFRNONAA >60 11/11/2017   GFRAA >60 11/11/2017      Assessment and Plan  Septic arthritis= continue on cephalexcin for adddn 2 months. But may decide to do chronic suppression pending all herresults.  Long term medication management = appears to be tolerating abtx. Continue on current regimen

## 2017-12-28 DIAGNOSIS — Z85118 Personal history of other malignant neoplasm of bronchus and lung: Secondary | ICD-10-CM | POA: Diagnosis not present

## 2017-12-28 DIAGNOSIS — Z86718 Personal history of other venous thrombosis and embolism: Secondary | ICD-10-CM | POA: Diagnosis not present

## 2017-12-28 DIAGNOSIS — B9561 Methicillin susceptible Staphylococcus aureus infection as the cause of diseases classified elsewhere: Secondary | ICD-10-CM | POA: Diagnosis not present

## 2017-12-28 DIAGNOSIS — I739 Peripheral vascular disease, unspecified: Secondary | ICD-10-CM | POA: Diagnosis not present

## 2017-12-28 DIAGNOSIS — G894 Chronic pain syndrome: Secondary | ICD-10-CM | POA: Diagnosis not present

## 2017-12-28 DIAGNOSIS — D509 Iron deficiency anemia, unspecified: Secondary | ICD-10-CM | POA: Diagnosis not present

## 2017-12-28 DIAGNOSIS — I10 Essential (primary) hypertension: Secondary | ICD-10-CM | POA: Diagnosis not present

## 2017-12-28 DIAGNOSIS — K769 Liver disease, unspecified: Secondary | ICD-10-CM | POA: Diagnosis not present

## 2017-12-28 DIAGNOSIS — Z79891 Long term (current) use of opiate analgesic: Secondary | ICD-10-CM | POA: Diagnosis not present

## 2017-12-28 DIAGNOSIS — L02415 Cutaneous abscess of right lower limb: Secondary | ICD-10-CM | POA: Diagnosis not present

## 2017-12-28 DIAGNOSIS — Z8673 Personal history of transient ischemic attack (TIA), and cerebral infarction without residual deficits: Secondary | ICD-10-CM | POA: Diagnosis not present

## 2017-12-28 DIAGNOSIS — Z86711 Personal history of pulmonary embolism: Secondary | ICD-10-CM | POA: Diagnosis not present

## 2017-12-28 DIAGNOSIS — I4891 Unspecified atrial fibrillation: Secondary | ICD-10-CM | POA: Diagnosis not present

## 2017-12-28 DIAGNOSIS — J449 Chronic obstructive pulmonary disease, unspecified: Secondary | ICD-10-CM | POA: Diagnosis not present

## 2017-12-28 DIAGNOSIS — M1991 Primary osteoarthritis, unspecified site: Secondary | ICD-10-CM | POA: Diagnosis not present

## 2017-12-28 DIAGNOSIS — Z452 Encounter for adjustment and management of vascular access device: Secondary | ICD-10-CM | POA: Diagnosis not present

## 2017-12-28 DIAGNOSIS — E7800 Pure hypercholesterolemia, unspecified: Secondary | ICD-10-CM | POA: Diagnosis not present

## 2017-12-28 DIAGNOSIS — K219 Gastro-esophageal reflux disease without esophagitis: Secondary | ICD-10-CM | POA: Diagnosis not present

## 2017-12-31 DIAGNOSIS — Z85118 Personal history of other malignant neoplasm of bronchus and lung: Secondary | ICD-10-CM | POA: Diagnosis not present

## 2017-12-31 DIAGNOSIS — K769 Liver disease, unspecified: Secondary | ICD-10-CM | POA: Diagnosis not present

## 2017-12-31 DIAGNOSIS — Z86711 Personal history of pulmonary embolism: Secondary | ICD-10-CM | POA: Diagnosis not present

## 2017-12-31 DIAGNOSIS — Z452 Encounter for adjustment and management of vascular access device: Secondary | ICD-10-CM | POA: Diagnosis not present

## 2017-12-31 DIAGNOSIS — G894 Chronic pain syndrome: Secondary | ICD-10-CM | POA: Diagnosis not present

## 2017-12-31 DIAGNOSIS — M1991 Primary osteoarthritis, unspecified site: Secondary | ICD-10-CM | POA: Diagnosis not present

## 2017-12-31 DIAGNOSIS — B9561 Methicillin susceptible Staphylococcus aureus infection as the cause of diseases classified elsewhere: Secondary | ICD-10-CM | POA: Diagnosis not present

## 2017-12-31 DIAGNOSIS — I4891 Unspecified atrial fibrillation: Secondary | ICD-10-CM | POA: Diagnosis not present

## 2017-12-31 DIAGNOSIS — Z86718 Personal history of other venous thrombosis and embolism: Secondary | ICD-10-CM | POA: Diagnosis not present

## 2017-12-31 DIAGNOSIS — J449 Chronic obstructive pulmonary disease, unspecified: Secondary | ICD-10-CM | POA: Diagnosis not present

## 2017-12-31 DIAGNOSIS — K219 Gastro-esophageal reflux disease without esophagitis: Secondary | ICD-10-CM | POA: Diagnosis not present

## 2017-12-31 DIAGNOSIS — E7800 Pure hypercholesterolemia, unspecified: Secondary | ICD-10-CM | POA: Diagnosis not present

## 2017-12-31 DIAGNOSIS — Z79891 Long term (current) use of opiate analgesic: Secondary | ICD-10-CM | POA: Diagnosis not present

## 2017-12-31 DIAGNOSIS — I10 Essential (primary) hypertension: Secondary | ICD-10-CM | POA: Diagnosis not present

## 2017-12-31 DIAGNOSIS — D509 Iron deficiency anemia, unspecified: Secondary | ICD-10-CM | POA: Diagnosis not present

## 2017-12-31 DIAGNOSIS — L02415 Cutaneous abscess of right lower limb: Secondary | ICD-10-CM | POA: Diagnosis not present

## 2017-12-31 DIAGNOSIS — I739 Peripheral vascular disease, unspecified: Secondary | ICD-10-CM | POA: Diagnosis not present

## 2017-12-31 DIAGNOSIS — Z8673 Personal history of transient ischemic attack (TIA), and cerebral infarction without residual deficits: Secondary | ICD-10-CM | POA: Diagnosis not present

## 2018-01-05 DIAGNOSIS — Z79899 Other long term (current) drug therapy: Secondary | ICD-10-CM | POA: Diagnosis not present

## 2018-01-05 DIAGNOSIS — M545 Low back pain: Secondary | ICD-10-CM | POA: Diagnosis not present

## 2018-01-05 DIAGNOSIS — M542 Cervicalgia: Secondary | ICD-10-CM | POA: Diagnosis not present

## 2018-01-05 DIAGNOSIS — G894 Chronic pain syndrome: Secondary | ICD-10-CM | POA: Diagnosis not present

## 2018-01-06 DIAGNOSIS — I739 Peripheral vascular disease, unspecified: Secondary | ICD-10-CM | POA: Diagnosis not present

## 2018-01-06 DIAGNOSIS — E7800 Pure hypercholesterolemia, unspecified: Secondary | ICD-10-CM | POA: Diagnosis not present

## 2018-01-06 DIAGNOSIS — D509 Iron deficiency anemia, unspecified: Secondary | ICD-10-CM | POA: Diagnosis not present

## 2018-01-06 DIAGNOSIS — Z8673 Personal history of transient ischemic attack (TIA), and cerebral infarction without residual deficits: Secondary | ICD-10-CM | POA: Diagnosis not present

## 2018-01-06 DIAGNOSIS — Z452 Encounter for adjustment and management of vascular access device: Secondary | ICD-10-CM | POA: Diagnosis not present

## 2018-01-06 DIAGNOSIS — K769 Liver disease, unspecified: Secondary | ICD-10-CM | POA: Diagnosis not present

## 2018-01-06 DIAGNOSIS — K219 Gastro-esophageal reflux disease without esophagitis: Secondary | ICD-10-CM | POA: Diagnosis not present

## 2018-01-06 DIAGNOSIS — J449 Chronic obstructive pulmonary disease, unspecified: Secondary | ICD-10-CM | POA: Diagnosis not present

## 2018-01-06 DIAGNOSIS — Z86711 Personal history of pulmonary embolism: Secondary | ICD-10-CM | POA: Diagnosis not present

## 2018-01-06 DIAGNOSIS — Z79891 Long term (current) use of opiate analgesic: Secondary | ICD-10-CM | POA: Diagnosis not present

## 2018-01-06 DIAGNOSIS — M1991 Primary osteoarthritis, unspecified site: Secondary | ICD-10-CM | POA: Diagnosis not present

## 2018-01-06 DIAGNOSIS — I4891 Unspecified atrial fibrillation: Secondary | ICD-10-CM | POA: Diagnosis not present

## 2018-01-06 DIAGNOSIS — Z85118 Personal history of other malignant neoplasm of bronchus and lung: Secondary | ICD-10-CM | POA: Diagnosis not present

## 2018-01-06 DIAGNOSIS — B9561 Methicillin susceptible Staphylococcus aureus infection as the cause of diseases classified elsewhere: Secondary | ICD-10-CM | POA: Diagnosis not present

## 2018-01-06 DIAGNOSIS — Z86718 Personal history of other venous thrombosis and embolism: Secondary | ICD-10-CM | POA: Diagnosis not present

## 2018-01-06 DIAGNOSIS — L02415 Cutaneous abscess of right lower limb: Secondary | ICD-10-CM | POA: Diagnosis not present

## 2018-01-06 DIAGNOSIS — G894 Chronic pain syndrome: Secondary | ICD-10-CM | POA: Diagnosis not present

## 2018-01-06 DIAGNOSIS — I10 Essential (primary) hypertension: Secondary | ICD-10-CM | POA: Diagnosis not present

## 2018-01-07 DIAGNOSIS — M1991 Primary osteoarthritis, unspecified site: Secondary | ICD-10-CM | POA: Diagnosis not present

## 2018-01-07 DIAGNOSIS — K219 Gastro-esophageal reflux disease without esophagitis: Secondary | ICD-10-CM | POA: Diagnosis not present

## 2018-01-07 DIAGNOSIS — B9561 Methicillin susceptible Staphylococcus aureus infection as the cause of diseases classified elsewhere: Secondary | ICD-10-CM | POA: Diagnosis not present

## 2018-01-07 DIAGNOSIS — Z452 Encounter for adjustment and management of vascular access device: Secondary | ICD-10-CM | POA: Diagnosis not present

## 2018-01-07 DIAGNOSIS — I4891 Unspecified atrial fibrillation: Secondary | ICD-10-CM | POA: Diagnosis not present

## 2018-01-07 DIAGNOSIS — Z79891 Long term (current) use of opiate analgesic: Secondary | ICD-10-CM | POA: Diagnosis not present

## 2018-01-07 DIAGNOSIS — Z8673 Personal history of transient ischemic attack (TIA), and cerebral infarction without residual deficits: Secondary | ICD-10-CM | POA: Diagnosis not present

## 2018-01-07 DIAGNOSIS — I10 Essential (primary) hypertension: Secondary | ICD-10-CM | POA: Diagnosis not present

## 2018-01-07 DIAGNOSIS — E7800 Pure hypercholesterolemia, unspecified: Secondary | ICD-10-CM | POA: Diagnosis not present

## 2018-01-07 DIAGNOSIS — Z86718 Personal history of other venous thrombosis and embolism: Secondary | ICD-10-CM | POA: Diagnosis not present

## 2018-01-07 DIAGNOSIS — G894 Chronic pain syndrome: Secondary | ICD-10-CM | POA: Diagnosis not present

## 2018-01-07 DIAGNOSIS — J449 Chronic obstructive pulmonary disease, unspecified: Secondary | ICD-10-CM | POA: Diagnosis not present

## 2018-01-07 DIAGNOSIS — Z86711 Personal history of pulmonary embolism: Secondary | ICD-10-CM | POA: Diagnosis not present

## 2018-01-07 DIAGNOSIS — K769 Liver disease, unspecified: Secondary | ICD-10-CM | POA: Diagnosis not present

## 2018-01-07 DIAGNOSIS — Z85118 Personal history of other malignant neoplasm of bronchus and lung: Secondary | ICD-10-CM | POA: Diagnosis not present

## 2018-01-07 DIAGNOSIS — L02415 Cutaneous abscess of right lower limb: Secondary | ICD-10-CM | POA: Diagnosis not present

## 2018-01-07 DIAGNOSIS — D509 Iron deficiency anemia, unspecified: Secondary | ICD-10-CM | POA: Diagnosis not present

## 2018-01-07 DIAGNOSIS — I739 Peripheral vascular disease, unspecified: Secondary | ICD-10-CM | POA: Diagnosis not present

## 2018-01-10 DIAGNOSIS — I4891 Unspecified atrial fibrillation: Secondary | ICD-10-CM | POA: Diagnosis not present

## 2018-01-10 DIAGNOSIS — K769 Liver disease, unspecified: Secondary | ICD-10-CM | POA: Diagnosis not present

## 2018-01-10 DIAGNOSIS — K219 Gastro-esophageal reflux disease without esophagitis: Secondary | ICD-10-CM | POA: Diagnosis not present

## 2018-01-10 DIAGNOSIS — Z452 Encounter for adjustment and management of vascular access device: Secondary | ICD-10-CM | POA: Diagnosis not present

## 2018-01-10 DIAGNOSIS — I10 Essential (primary) hypertension: Secondary | ICD-10-CM | POA: Diagnosis not present

## 2018-01-10 DIAGNOSIS — J449 Chronic obstructive pulmonary disease, unspecified: Secondary | ICD-10-CM | POA: Diagnosis not present

## 2018-01-10 DIAGNOSIS — I739 Peripheral vascular disease, unspecified: Secondary | ICD-10-CM | POA: Diagnosis not present

## 2018-01-10 DIAGNOSIS — D509 Iron deficiency anemia, unspecified: Secondary | ICD-10-CM | POA: Diagnosis not present

## 2018-01-10 DIAGNOSIS — G894 Chronic pain syndrome: Secondary | ICD-10-CM | POA: Diagnosis not present

## 2018-01-10 DIAGNOSIS — M1991 Primary osteoarthritis, unspecified site: Secondary | ICD-10-CM | POA: Diagnosis not present

## 2018-01-10 DIAGNOSIS — E7800 Pure hypercholesterolemia, unspecified: Secondary | ICD-10-CM | POA: Diagnosis not present

## 2018-01-10 DIAGNOSIS — Z85118 Personal history of other malignant neoplasm of bronchus and lung: Secondary | ICD-10-CM | POA: Diagnosis not present

## 2018-01-10 DIAGNOSIS — Z79891 Long term (current) use of opiate analgesic: Secondary | ICD-10-CM | POA: Diagnosis not present

## 2018-01-10 DIAGNOSIS — Z86718 Personal history of other venous thrombosis and embolism: Secondary | ICD-10-CM | POA: Diagnosis not present

## 2018-01-10 DIAGNOSIS — B9561 Methicillin susceptible Staphylococcus aureus infection as the cause of diseases classified elsewhere: Secondary | ICD-10-CM | POA: Diagnosis not present

## 2018-01-10 DIAGNOSIS — Z86711 Personal history of pulmonary embolism: Secondary | ICD-10-CM | POA: Diagnosis not present

## 2018-01-10 DIAGNOSIS — Z8673 Personal history of transient ischemic attack (TIA), and cerebral infarction without residual deficits: Secondary | ICD-10-CM | POA: Diagnosis not present

## 2018-01-10 DIAGNOSIS — L02415 Cutaneous abscess of right lower limb: Secondary | ICD-10-CM | POA: Diagnosis not present

## 2018-01-12 DIAGNOSIS — L02415 Cutaneous abscess of right lower limb: Secondary | ICD-10-CM | POA: Diagnosis not present

## 2018-01-12 DIAGNOSIS — J449 Chronic obstructive pulmonary disease, unspecified: Secondary | ICD-10-CM | POA: Diagnosis not present

## 2018-01-12 DIAGNOSIS — M1991 Primary osteoarthritis, unspecified site: Secondary | ICD-10-CM | POA: Diagnosis not present

## 2018-01-12 DIAGNOSIS — Z85118 Personal history of other malignant neoplasm of bronchus and lung: Secondary | ICD-10-CM | POA: Diagnosis not present

## 2018-01-12 DIAGNOSIS — D509 Iron deficiency anemia, unspecified: Secondary | ICD-10-CM | POA: Diagnosis not present

## 2018-01-12 DIAGNOSIS — Z79891 Long term (current) use of opiate analgesic: Secondary | ICD-10-CM | POA: Diagnosis not present

## 2018-01-12 DIAGNOSIS — I739 Peripheral vascular disease, unspecified: Secondary | ICD-10-CM | POA: Diagnosis not present

## 2018-01-12 DIAGNOSIS — Z86718 Personal history of other venous thrombosis and embolism: Secondary | ICD-10-CM | POA: Diagnosis not present

## 2018-01-12 DIAGNOSIS — Z8673 Personal history of transient ischemic attack (TIA), and cerebral infarction without residual deficits: Secondary | ICD-10-CM | POA: Diagnosis not present

## 2018-01-12 DIAGNOSIS — I4891 Unspecified atrial fibrillation: Secondary | ICD-10-CM | POA: Diagnosis not present

## 2018-01-12 DIAGNOSIS — Z86711 Personal history of pulmonary embolism: Secondary | ICD-10-CM | POA: Diagnosis not present

## 2018-01-12 DIAGNOSIS — G894 Chronic pain syndrome: Secondary | ICD-10-CM | POA: Diagnosis not present

## 2018-01-12 DIAGNOSIS — K219 Gastro-esophageal reflux disease without esophagitis: Secondary | ICD-10-CM | POA: Diagnosis not present

## 2018-01-12 DIAGNOSIS — B9561 Methicillin susceptible Staphylococcus aureus infection as the cause of diseases classified elsewhere: Secondary | ICD-10-CM | POA: Diagnosis not present

## 2018-01-12 DIAGNOSIS — K769 Liver disease, unspecified: Secondary | ICD-10-CM | POA: Diagnosis not present

## 2018-01-12 DIAGNOSIS — I10 Essential (primary) hypertension: Secondary | ICD-10-CM | POA: Diagnosis not present

## 2018-01-12 DIAGNOSIS — Z452 Encounter for adjustment and management of vascular access device: Secondary | ICD-10-CM | POA: Diagnosis not present

## 2018-01-12 DIAGNOSIS — E7800 Pure hypercholesterolemia, unspecified: Secondary | ICD-10-CM | POA: Diagnosis not present

## 2018-01-14 DIAGNOSIS — I10 Essential (primary) hypertension: Secondary | ICD-10-CM | POA: Diagnosis not present

## 2018-01-14 DIAGNOSIS — J449 Chronic obstructive pulmonary disease, unspecified: Secondary | ICD-10-CM | POA: Diagnosis not present

## 2018-01-14 DIAGNOSIS — Z8673 Personal history of transient ischemic attack (TIA), and cerebral infarction without residual deficits: Secondary | ICD-10-CM | POA: Diagnosis not present

## 2018-01-14 DIAGNOSIS — G894 Chronic pain syndrome: Secondary | ICD-10-CM | POA: Diagnosis not present

## 2018-01-14 DIAGNOSIS — E7800 Pure hypercholesterolemia, unspecified: Secondary | ICD-10-CM | POA: Diagnosis not present

## 2018-01-14 DIAGNOSIS — Z86718 Personal history of other venous thrombosis and embolism: Secondary | ICD-10-CM | POA: Diagnosis not present

## 2018-01-14 DIAGNOSIS — D509 Iron deficiency anemia, unspecified: Secondary | ICD-10-CM | POA: Diagnosis not present

## 2018-01-14 DIAGNOSIS — B9561 Methicillin susceptible Staphylococcus aureus infection as the cause of diseases classified elsewhere: Secondary | ICD-10-CM | POA: Diagnosis not present

## 2018-01-14 DIAGNOSIS — L02415 Cutaneous abscess of right lower limb: Secondary | ICD-10-CM | POA: Diagnosis not present

## 2018-01-14 DIAGNOSIS — Z85118 Personal history of other malignant neoplasm of bronchus and lung: Secondary | ICD-10-CM | POA: Diagnosis not present

## 2018-01-14 DIAGNOSIS — K219 Gastro-esophageal reflux disease without esophagitis: Secondary | ICD-10-CM | POA: Diagnosis not present

## 2018-01-14 DIAGNOSIS — M1991 Primary osteoarthritis, unspecified site: Secondary | ICD-10-CM | POA: Diagnosis not present

## 2018-01-14 DIAGNOSIS — Z79891 Long term (current) use of opiate analgesic: Secondary | ICD-10-CM | POA: Diagnosis not present

## 2018-01-14 DIAGNOSIS — I739 Peripheral vascular disease, unspecified: Secondary | ICD-10-CM | POA: Diagnosis not present

## 2018-01-14 DIAGNOSIS — K769 Liver disease, unspecified: Secondary | ICD-10-CM | POA: Diagnosis not present

## 2018-01-14 DIAGNOSIS — Z86711 Personal history of pulmonary embolism: Secondary | ICD-10-CM | POA: Diagnosis not present

## 2018-01-14 DIAGNOSIS — Z452 Encounter for adjustment and management of vascular access device: Secondary | ICD-10-CM | POA: Diagnosis not present

## 2018-01-14 DIAGNOSIS — I4891 Unspecified atrial fibrillation: Secondary | ICD-10-CM | POA: Diagnosis not present

## 2018-01-17 DIAGNOSIS — Z86711 Personal history of pulmonary embolism: Secondary | ICD-10-CM | POA: Diagnosis not present

## 2018-01-17 DIAGNOSIS — Z85118 Personal history of other malignant neoplasm of bronchus and lung: Secondary | ICD-10-CM | POA: Diagnosis not present

## 2018-01-17 DIAGNOSIS — I4891 Unspecified atrial fibrillation: Secondary | ICD-10-CM | POA: Diagnosis not present

## 2018-01-17 DIAGNOSIS — Z86718 Personal history of other venous thrombosis and embolism: Secondary | ICD-10-CM | POA: Diagnosis not present

## 2018-01-17 DIAGNOSIS — K219 Gastro-esophageal reflux disease without esophagitis: Secondary | ICD-10-CM | POA: Diagnosis not present

## 2018-01-17 DIAGNOSIS — D509 Iron deficiency anemia, unspecified: Secondary | ICD-10-CM | POA: Diagnosis not present

## 2018-01-17 DIAGNOSIS — E7800 Pure hypercholesterolemia, unspecified: Secondary | ICD-10-CM | POA: Diagnosis not present

## 2018-01-17 DIAGNOSIS — G894 Chronic pain syndrome: Secondary | ICD-10-CM | POA: Diagnosis not present

## 2018-01-17 DIAGNOSIS — Z79891 Long term (current) use of opiate analgesic: Secondary | ICD-10-CM | POA: Diagnosis not present

## 2018-01-17 DIAGNOSIS — I10 Essential (primary) hypertension: Secondary | ICD-10-CM | POA: Diagnosis not present

## 2018-01-17 DIAGNOSIS — K769 Liver disease, unspecified: Secondary | ICD-10-CM | POA: Diagnosis not present

## 2018-01-17 DIAGNOSIS — J449 Chronic obstructive pulmonary disease, unspecified: Secondary | ICD-10-CM | POA: Diagnosis not present

## 2018-01-17 DIAGNOSIS — B9561 Methicillin susceptible Staphylococcus aureus infection as the cause of diseases classified elsewhere: Secondary | ICD-10-CM | POA: Diagnosis not present

## 2018-01-17 DIAGNOSIS — M1991 Primary osteoarthritis, unspecified site: Secondary | ICD-10-CM | POA: Diagnosis not present

## 2018-01-17 DIAGNOSIS — L02415 Cutaneous abscess of right lower limb: Secondary | ICD-10-CM | POA: Diagnosis not present

## 2018-01-17 DIAGNOSIS — Z8673 Personal history of transient ischemic attack (TIA), and cerebral infarction without residual deficits: Secondary | ICD-10-CM | POA: Diagnosis not present

## 2018-01-17 DIAGNOSIS — I739 Peripheral vascular disease, unspecified: Secondary | ICD-10-CM | POA: Diagnosis not present

## 2018-01-17 DIAGNOSIS — Z452 Encounter for adjustment and management of vascular access device: Secondary | ICD-10-CM | POA: Diagnosis not present

## 2018-01-19 DIAGNOSIS — L02415 Cutaneous abscess of right lower limb: Secondary | ICD-10-CM | POA: Diagnosis not present

## 2018-01-19 DIAGNOSIS — E7800 Pure hypercholesterolemia, unspecified: Secondary | ICD-10-CM | POA: Diagnosis not present

## 2018-01-19 DIAGNOSIS — I739 Peripheral vascular disease, unspecified: Secondary | ICD-10-CM | POA: Diagnosis not present

## 2018-01-19 DIAGNOSIS — I4891 Unspecified atrial fibrillation: Secondary | ICD-10-CM | POA: Diagnosis not present

## 2018-01-19 DIAGNOSIS — K219 Gastro-esophageal reflux disease without esophagitis: Secondary | ICD-10-CM | POA: Diagnosis not present

## 2018-01-19 DIAGNOSIS — Z8673 Personal history of transient ischemic attack (TIA), and cerebral infarction without residual deficits: Secondary | ICD-10-CM | POA: Diagnosis not present

## 2018-01-19 DIAGNOSIS — D509 Iron deficiency anemia, unspecified: Secondary | ICD-10-CM | POA: Diagnosis not present

## 2018-01-19 DIAGNOSIS — Z85118 Personal history of other malignant neoplasm of bronchus and lung: Secondary | ICD-10-CM | POA: Diagnosis not present

## 2018-01-19 DIAGNOSIS — Z452 Encounter for adjustment and management of vascular access device: Secondary | ICD-10-CM | POA: Diagnosis not present

## 2018-01-19 DIAGNOSIS — I10 Essential (primary) hypertension: Secondary | ICD-10-CM | POA: Diagnosis not present

## 2018-01-19 DIAGNOSIS — K769 Liver disease, unspecified: Secondary | ICD-10-CM | POA: Diagnosis not present

## 2018-01-19 DIAGNOSIS — Z86711 Personal history of pulmonary embolism: Secondary | ICD-10-CM | POA: Diagnosis not present

## 2018-01-19 DIAGNOSIS — Z79891 Long term (current) use of opiate analgesic: Secondary | ICD-10-CM | POA: Diagnosis not present

## 2018-01-19 DIAGNOSIS — J449 Chronic obstructive pulmonary disease, unspecified: Secondary | ICD-10-CM | POA: Diagnosis not present

## 2018-01-19 DIAGNOSIS — B9561 Methicillin susceptible Staphylococcus aureus infection as the cause of diseases classified elsewhere: Secondary | ICD-10-CM | POA: Diagnosis not present

## 2018-01-19 DIAGNOSIS — M1991 Primary osteoarthritis, unspecified site: Secondary | ICD-10-CM | POA: Diagnosis not present

## 2018-01-19 DIAGNOSIS — G894 Chronic pain syndrome: Secondary | ICD-10-CM | POA: Diagnosis not present

## 2018-01-19 DIAGNOSIS — Z86718 Personal history of other venous thrombosis and embolism: Secondary | ICD-10-CM | POA: Diagnosis not present

## 2018-01-21 DIAGNOSIS — L02415 Cutaneous abscess of right lower limb: Secondary | ICD-10-CM | POA: Diagnosis not present

## 2018-01-21 DIAGNOSIS — E7800 Pure hypercholesterolemia, unspecified: Secondary | ICD-10-CM | POA: Diagnosis not present

## 2018-01-21 DIAGNOSIS — Z86711 Personal history of pulmonary embolism: Secondary | ICD-10-CM | POA: Diagnosis not present

## 2018-01-21 DIAGNOSIS — Z86718 Personal history of other venous thrombosis and embolism: Secondary | ICD-10-CM | POA: Diagnosis not present

## 2018-01-21 DIAGNOSIS — I739 Peripheral vascular disease, unspecified: Secondary | ICD-10-CM | POA: Diagnosis not present

## 2018-01-21 DIAGNOSIS — Z79891 Long term (current) use of opiate analgesic: Secondary | ICD-10-CM | POA: Diagnosis not present

## 2018-01-21 DIAGNOSIS — D509 Iron deficiency anemia, unspecified: Secondary | ICD-10-CM | POA: Diagnosis not present

## 2018-01-21 DIAGNOSIS — G894 Chronic pain syndrome: Secondary | ICD-10-CM | POA: Diagnosis not present

## 2018-01-21 DIAGNOSIS — M1991 Primary osteoarthritis, unspecified site: Secondary | ICD-10-CM | POA: Diagnosis not present

## 2018-01-21 DIAGNOSIS — K769 Liver disease, unspecified: Secondary | ICD-10-CM | POA: Diagnosis not present

## 2018-01-21 DIAGNOSIS — K219 Gastro-esophageal reflux disease without esophagitis: Secondary | ICD-10-CM | POA: Diagnosis not present

## 2018-01-21 DIAGNOSIS — J449 Chronic obstructive pulmonary disease, unspecified: Secondary | ICD-10-CM | POA: Diagnosis not present

## 2018-01-21 DIAGNOSIS — I10 Essential (primary) hypertension: Secondary | ICD-10-CM | POA: Diagnosis not present

## 2018-01-21 DIAGNOSIS — B9561 Methicillin susceptible Staphylococcus aureus infection as the cause of diseases classified elsewhere: Secondary | ICD-10-CM | POA: Diagnosis not present

## 2018-01-21 DIAGNOSIS — I4891 Unspecified atrial fibrillation: Secondary | ICD-10-CM | POA: Diagnosis not present

## 2018-01-21 DIAGNOSIS — Z85118 Personal history of other malignant neoplasm of bronchus and lung: Secondary | ICD-10-CM | POA: Diagnosis not present

## 2018-01-21 DIAGNOSIS — Z8673 Personal history of transient ischemic attack (TIA), and cerebral infarction without residual deficits: Secondary | ICD-10-CM | POA: Diagnosis not present

## 2018-01-21 DIAGNOSIS — Z452 Encounter for adjustment and management of vascular access device: Secondary | ICD-10-CM | POA: Diagnosis not present

## 2018-01-24 DIAGNOSIS — B9561 Methicillin susceptible Staphylococcus aureus infection as the cause of diseases classified elsewhere: Secondary | ICD-10-CM | POA: Diagnosis not present

## 2018-01-24 DIAGNOSIS — K219 Gastro-esophageal reflux disease without esophagitis: Secondary | ICD-10-CM | POA: Diagnosis not present

## 2018-01-24 DIAGNOSIS — G894 Chronic pain syndrome: Secondary | ICD-10-CM | POA: Diagnosis not present

## 2018-01-24 DIAGNOSIS — D509 Iron deficiency anemia, unspecified: Secondary | ICD-10-CM | POA: Diagnosis not present

## 2018-01-24 DIAGNOSIS — Z86711 Personal history of pulmonary embolism: Secondary | ICD-10-CM | POA: Diagnosis not present

## 2018-01-24 DIAGNOSIS — Z8673 Personal history of transient ischemic attack (TIA), and cerebral infarction without residual deficits: Secondary | ICD-10-CM | POA: Diagnosis not present

## 2018-01-24 DIAGNOSIS — L02415 Cutaneous abscess of right lower limb: Secondary | ICD-10-CM | POA: Diagnosis not present

## 2018-01-24 DIAGNOSIS — Z86718 Personal history of other venous thrombosis and embolism: Secondary | ICD-10-CM | POA: Diagnosis not present

## 2018-01-24 DIAGNOSIS — Z452 Encounter for adjustment and management of vascular access device: Secondary | ICD-10-CM | POA: Diagnosis not present

## 2018-01-24 DIAGNOSIS — I739 Peripheral vascular disease, unspecified: Secondary | ICD-10-CM | POA: Diagnosis not present

## 2018-01-24 DIAGNOSIS — E7800 Pure hypercholesterolemia, unspecified: Secondary | ICD-10-CM | POA: Diagnosis not present

## 2018-01-24 DIAGNOSIS — I4891 Unspecified atrial fibrillation: Secondary | ICD-10-CM | POA: Diagnosis not present

## 2018-01-24 DIAGNOSIS — Z85118 Personal history of other malignant neoplasm of bronchus and lung: Secondary | ICD-10-CM | POA: Diagnosis not present

## 2018-01-24 DIAGNOSIS — M1991 Primary osteoarthritis, unspecified site: Secondary | ICD-10-CM | POA: Diagnosis not present

## 2018-01-24 DIAGNOSIS — I10 Essential (primary) hypertension: Secondary | ICD-10-CM | POA: Diagnosis not present

## 2018-01-24 DIAGNOSIS — J449 Chronic obstructive pulmonary disease, unspecified: Secondary | ICD-10-CM | POA: Diagnosis not present

## 2018-01-24 DIAGNOSIS — Z79891 Long term (current) use of opiate analgesic: Secondary | ICD-10-CM | POA: Diagnosis not present

## 2018-01-24 DIAGNOSIS — K769 Liver disease, unspecified: Secondary | ICD-10-CM | POA: Diagnosis not present

## 2018-01-25 DIAGNOSIS — Z85118 Personal history of other malignant neoplasm of bronchus and lung: Secondary | ICD-10-CM | POA: Diagnosis not present

## 2018-01-25 DIAGNOSIS — J449 Chronic obstructive pulmonary disease, unspecified: Secondary | ICD-10-CM | POA: Diagnosis not present

## 2018-01-25 DIAGNOSIS — Z86718 Personal history of other venous thrombosis and embolism: Secondary | ICD-10-CM | POA: Diagnosis not present

## 2018-01-25 DIAGNOSIS — I739 Peripheral vascular disease, unspecified: Secondary | ICD-10-CM | POA: Diagnosis not present

## 2018-01-25 DIAGNOSIS — Z86711 Personal history of pulmonary embolism: Secondary | ICD-10-CM | POA: Diagnosis not present

## 2018-01-25 DIAGNOSIS — I4891 Unspecified atrial fibrillation: Secondary | ICD-10-CM | POA: Diagnosis not present

## 2018-01-25 DIAGNOSIS — K769 Liver disease, unspecified: Secondary | ICD-10-CM | POA: Diagnosis not present

## 2018-01-25 DIAGNOSIS — Z452 Encounter for adjustment and management of vascular access device: Secondary | ICD-10-CM | POA: Diagnosis not present

## 2018-01-25 DIAGNOSIS — G894 Chronic pain syndrome: Secondary | ICD-10-CM | POA: Diagnosis not present

## 2018-01-25 DIAGNOSIS — Z79891 Long term (current) use of opiate analgesic: Secondary | ICD-10-CM | POA: Diagnosis not present

## 2018-01-25 DIAGNOSIS — Z8673 Personal history of transient ischemic attack (TIA), and cerebral infarction without residual deficits: Secondary | ICD-10-CM | POA: Diagnosis not present

## 2018-01-25 DIAGNOSIS — I10 Essential (primary) hypertension: Secondary | ICD-10-CM | POA: Diagnosis not present

## 2018-01-25 DIAGNOSIS — E7800 Pure hypercholesterolemia, unspecified: Secondary | ICD-10-CM | POA: Diagnosis not present

## 2018-01-25 DIAGNOSIS — B9561 Methicillin susceptible Staphylococcus aureus infection as the cause of diseases classified elsewhere: Secondary | ICD-10-CM | POA: Diagnosis not present

## 2018-01-25 DIAGNOSIS — M1991 Primary osteoarthritis, unspecified site: Secondary | ICD-10-CM | POA: Diagnosis not present

## 2018-01-25 DIAGNOSIS — K219 Gastro-esophageal reflux disease without esophagitis: Secondary | ICD-10-CM | POA: Diagnosis not present

## 2018-01-25 DIAGNOSIS — D509 Iron deficiency anemia, unspecified: Secondary | ICD-10-CM | POA: Diagnosis not present

## 2018-01-25 DIAGNOSIS — L02415 Cutaneous abscess of right lower limb: Secondary | ICD-10-CM | POA: Diagnosis not present

## 2018-01-26 DIAGNOSIS — K769 Liver disease, unspecified: Secondary | ICD-10-CM | POA: Diagnosis not present

## 2018-01-26 DIAGNOSIS — M1991 Primary osteoarthritis, unspecified site: Secondary | ICD-10-CM | POA: Diagnosis not present

## 2018-01-26 DIAGNOSIS — Z86718 Personal history of other venous thrombosis and embolism: Secondary | ICD-10-CM | POA: Diagnosis not present

## 2018-01-26 DIAGNOSIS — I4891 Unspecified atrial fibrillation: Secondary | ICD-10-CM | POA: Diagnosis not present

## 2018-01-26 DIAGNOSIS — D509 Iron deficiency anemia, unspecified: Secondary | ICD-10-CM | POA: Diagnosis not present

## 2018-01-26 DIAGNOSIS — L02415 Cutaneous abscess of right lower limb: Secondary | ICD-10-CM | POA: Diagnosis not present

## 2018-01-26 DIAGNOSIS — J449 Chronic obstructive pulmonary disease, unspecified: Secondary | ICD-10-CM | POA: Diagnosis not present

## 2018-01-26 DIAGNOSIS — Z85118 Personal history of other malignant neoplasm of bronchus and lung: Secondary | ICD-10-CM | POA: Diagnosis not present

## 2018-01-26 DIAGNOSIS — I739 Peripheral vascular disease, unspecified: Secondary | ICD-10-CM | POA: Diagnosis not present

## 2018-01-26 DIAGNOSIS — Z8673 Personal history of transient ischemic attack (TIA), and cerebral infarction without residual deficits: Secondary | ICD-10-CM | POA: Diagnosis not present

## 2018-01-26 DIAGNOSIS — Z86711 Personal history of pulmonary embolism: Secondary | ICD-10-CM | POA: Diagnosis not present

## 2018-01-26 DIAGNOSIS — G894 Chronic pain syndrome: Secondary | ICD-10-CM | POA: Diagnosis not present

## 2018-01-26 DIAGNOSIS — K219 Gastro-esophageal reflux disease without esophagitis: Secondary | ICD-10-CM | POA: Diagnosis not present

## 2018-01-26 DIAGNOSIS — E7800 Pure hypercholesterolemia, unspecified: Secondary | ICD-10-CM | POA: Diagnosis not present

## 2018-01-26 DIAGNOSIS — Z79891 Long term (current) use of opiate analgesic: Secondary | ICD-10-CM | POA: Diagnosis not present

## 2018-01-26 DIAGNOSIS — B9561 Methicillin susceptible Staphylococcus aureus infection as the cause of diseases classified elsewhere: Secondary | ICD-10-CM | POA: Diagnosis not present

## 2018-01-26 DIAGNOSIS — Z452 Encounter for adjustment and management of vascular access device: Secondary | ICD-10-CM | POA: Diagnosis not present

## 2018-01-26 DIAGNOSIS — I10 Essential (primary) hypertension: Secondary | ICD-10-CM | POA: Diagnosis not present

## 2018-02-02 DIAGNOSIS — M009 Pyogenic arthritis, unspecified: Secondary | ICD-10-CM | POA: Diagnosis not present

## 2018-02-02 DIAGNOSIS — Z720 Tobacco use: Secondary | ICD-10-CM | POA: Diagnosis not present

## 2018-02-02 DIAGNOSIS — M545 Low back pain: Secondary | ICD-10-CM | POA: Diagnosis not present

## 2018-02-02 DIAGNOSIS — G894 Chronic pain syndrome: Secondary | ICD-10-CM | POA: Diagnosis not present

## 2018-02-02 DIAGNOSIS — Z79899 Other long term (current) drug therapy: Secondary | ICD-10-CM | POA: Diagnosis not present

## 2018-02-08 DIAGNOSIS — Z743 Need for continuous supervision: Secondary | ICD-10-CM | POA: Diagnosis not present

## 2018-02-08 DIAGNOSIS — S3992XA Unspecified injury of lower back, initial encounter: Secondary | ICD-10-CM | POA: Diagnosis not present

## 2018-02-08 DIAGNOSIS — J449 Chronic obstructive pulmonary disease, unspecified: Secondary | ICD-10-CM | POA: Diagnosis not present

## 2018-02-08 DIAGNOSIS — R52 Pain, unspecified: Secondary | ICD-10-CM | POA: Diagnosis not present

## 2018-02-08 DIAGNOSIS — M545 Low back pain: Secondary | ICD-10-CM | POA: Diagnosis not present

## 2018-02-08 DIAGNOSIS — S0990XA Unspecified injury of head, initial encounter: Secondary | ICD-10-CM | POA: Diagnosis not present

## 2018-02-08 DIAGNOSIS — M533 Sacrococcygeal disorders, not elsewhere classified: Secondary | ICD-10-CM | POA: Diagnosis not present

## 2018-02-08 DIAGNOSIS — Z79899 Other long term (current) drug therapy: Secondary | ICD-10-CM | POA: Diagnosis not present

## 2018-02-08 DIAGNOSIS — S3993XA Unspecified injury of pelvis, initial encounter: Secondary | ICD-10-CM | POA: Diagnosis not present

## 2018-02-08 DIAGNOSIS — R0902 Hypoxemia: Secondary | ICD-10-CM | POA: Diagnosis not present

## 2018-02-08 DIAGNOSIS — Z7902 Long term (current) use of antithrombotics/antiplatelets: Secondary | ICD-10-CM | POA: Diagnosis not present

## 2018-02-08 DIAGNOSIS — S299XXA Unspecified injury of thorax, initial encounter: Secondary | ICD-10-CM | POA: Diagnosis not present

## 2018-02-08 DIAGNOSIS — W19XXXA Unspecified fall, initial encounter: Secondary | ICD-10-CM | POA: Diagnosis not present

## 2018-02-08 DIAGNOSIS — Z79891 Long term (current) use of opiate analgesic: Secondary | ICD-10-CM | POA: Diagnosis not present

## 2018-02-08 DIAGNOSIS — S32591A Other specified fracture of right pubis, initial encounter for closed fracture: Secondary | ICD-10-CM | POA: Diagnosis not present

## 2018-02-08 DIAGNOSIS — I10 Essential (primary) hypertension: Secondary | ICD-10-CM | POA: Diagnosis not present

## 2018-02-11 DIAGNOSIS — N390 Urinary tract infection, site not specified: Secondary | ICD-10-CM | POA: Diagnosis not present

## 2018-02-11 DIAGNOSIS — M5489 Other dorsalgia: Secondary | ICD-10-CM | POA: Diagnosis not present

## 2018-02-11 DIAGNOSIS — W19XXXA Unspecified fall, initial encounter: Secondary | ICD-10-CM | POA: Diagnosis not present

## 2018-02-11 DIAGNOSIS — I10 Essential (primary) hypertension: Secondary | ICD-10-CM | POA: Diagnosis not present

## 2018-02-11 DIAGNOSIS — R1084 Generalized abdominal pain: Secondary | ICD-10-CM | POA: Diagnosis not present

## 2018-02-11 DIAGNOSIS — B9689 Other specified bacterial agents as the cause of diseases classified elsewhere: Secondary | ICD-10-CM | POA: Diagnosis not present

## 2018-02-11 DIAGNOSIS — I517 Cardiomegaly: Secondary | ICD-10-CM | POA: Diagnosis not present

## 2018-02-11 DIAGNOSIS — R8271 Bacteriuria: Secondary | ICD-10-CM | POA: Diagnosis not present

## 2018-02-11 DIAGNOSIS — R2689 Other abnormalities of gait and mobility: Secondary | ICD-10-CM | POA: Diagnosis not present

## 2018-02-11 DIAGNOSIS — Z86711 Personal history of pulmonary embolism: Secondary | ICD-10-CM | POA: Diagnosis not present

## 2018-02-11 DIAGNOSIS — J449 Chronic obstructive pulmonary disease, unspecified: Secondary | ICD-10-CM | POA: Diagnosis not present

## 2018-02-11 DIAGNOSIS — S3210XA Unspecified fracture of sacrum, initial encounter for closed fracture: Secondary | ICD-10-CM | POA: Diagnosis not present

## 2018-02-11 DIAGNOSIS — G8929 Other chronic pain: Secondary | ICD-10-CM | POA: Diagnosis not present

## 2018-02-11 DIAGNOSIS — S32591A Other specified fracture of right pubis, initial encounter for closed fracture: Secondary | ICD-10-CM | POA: Diagnosis not present

## 2018-02-11 DIAGNOSIS — Z79899 Other long term (current) drug therapy: Secondary | ICD-10-CM | POA: Diagnosis not present

## 2018-02-11 DIAGNOSIS — Z743 Need for continuous supervision: Secondary | ICD-10-CM | POA: Diagnosis not present

## 2018-02-11 DIAGNOSIS — R262 Difficulty in walking, not elsewhere classified: Secondary | ICD-10-CM | POA: Diagnosis not present

## 2018-02-12 DIAGNOSIS — I10 Essential (primary) hypertension: Secondary | ICD-10-CM | POA: Diagnosis not present

## 2018-02-12 DIAGNOSIS — R262 Difficulty in walking, not elsewhere classified: Secondary | ICD-10-CM | POA: Diagnosis not present

## 2018-02-12 DIAGNOSIS — Z86711 Personal history of pulmonary embolism: Secondary | ICD-10-CM | POA: Diagnosis not present

## 2018-02-12 DIAGNOSIS — R1084 Generalized abdominal pain: Secondary | ICD-10-CM | POA: Diagnosis not present

## 2018-02-12 DIAGNOSIS — G8929 Other chronic pain: Secondary | ICD-10-CM | POA: Diagnosis not present

## 2018-02-12 DIAGNOSIS — J449 Chronic obstructive pulmonary disease, unspecified: Secondary | ICD-10-CM | POA: Diagnosis not present

## 2018-02-12 DIAGNOSIS — S3210XA Unspecified fracture of sacrum, initial encounter for closed fracture: Secondary | ICD-10-CM | POA: Diagnosis not present

## 2018-02-12 DIAGNOSIS — R8271 Bacteriuria: Secondary | ICD-10-CM | POA: Diagnosis not present

## 2018-02-12 DIAGNOSIS — S32591A Other specified fracture of right pubis, initial encounter for closed fracture: Secondary | ICD-10-CM | POA: Diagnosis not present

## 2018-02-12 DIAGNOSIS — Z79899 Other long term (current) drug therapy: Secondary | ICD-10-CM | POA: Diagnosis not present

## 2018-02-13 DIAGNOSIS — I10 Essential (primary) hypertension: Secondary | ICD-10-CM | POA: Diagnosis not present

## 2018-02-13 DIAGNOSIS — S32591A Other specified fracture of right pubis, initial encounter for closed fracture: Secondary | ICD-10-CM | POA: Diagnosis not present

## 2018-02-13 DIAGNOSIS — R8271 Bacteriuria: Secondary | ICD-10-CM | POA: Diagnosis not present

## 2018-02-13 DIAGNOSIS — S3210XA Unspecified fracture of sacrum, initial encounter for closed fracture: Secondary | ICD-10-CM | POA: Diagnosis not present

## 2018-02-13 DIAGNOSIS — Z79899 Other long term (current) drug therapy: Secondary | ICD-10-CM | POA: Diagnosis not present

## 2018-02-13 DIAGNOSIS — Z86711 Personal history of pulmonary embolism: Secondary | ICD-10-CM | POA: Diagnosis not present

## 2018-02-13 DIAGNOSIS — R262 Difficulty in walking, not elsewhere classified: Secondary | ICD-10-CM | POA: Diagnosis not present

## 2018-02-13 DIAGNOSIS — J449 Chronic obstructive pulmonary disease, unspecified: Secondary | ICD-10-CM | POA: Diagnosis not present

## 2018-02-13 DIAGNOSIS — G8929 Other chronic pain: Secondary | ICD-10-CM | POA: Diagnosis not present

## 2018-02-13 DIAGNOSIS — R1084 Generalized abdominal pain: Secondary | ICD-10-CM | POA: Diagnosis not present

## 2018-02-14 DIAGNOSIS — R8271 Bacteriuria: Secondary | ICD-10-CM | POA: Diagnosis not present

## 2018-02-14 DIAGNOSIS — R262 Difficulty in walking, not elsewhere classified: Secondary | ICD-10-CM | POA: Diagnosis not present

## 2018-02-14 DIAGNOSIS — Z86711 Personal history of pulmonary embolism: Secondary | ICD-10-CM | POA: Diagnosis not present

## 2018-02-14 DIAGNOSIS — R1084 Generalized abdominal pain: Secondary | ICD-10-CM | POA: Diagnosis not present

## 2018-02-14 DIAGNOSIS — G8929 Other chronic pain: Secondary | ICD-10-CM | POA: Diagnosis not present

## 2018-02-14 DIAGNOSIS — S32599A Other specified fracture of unspecified pubis, initial encounter for closed fracture: Secondary | ICD-10-CM | POA: Diagnosis not present

## 2018-02-14 DIAGNOSIS — Z79899 Other long term (current) drug therapy: Secondary | ICD-10-CM | POA: Diagnosis not present

## 2018-02-14 DIAGNOSIS — S3210XA Unspecified fracture of sacrum, initial encounter for closed fracture: Secondary | ICD-10-CM | POA: Diagnosis not present

## 2018-02-14 DIAGNOSIS — S32591A Other specified fracture of right pubis, initial encounter for closed fracture: Secondary | ICD-10-CM | POA: Diagnosis not present

## 2018-02-14 DIAGNOSIS — J449 Chronic obstructive pulmonary disease, unspecified: Secondary | ICD-10-CM | POA: Diagnosis not present

## 2018-02-14 DIAGNOSIS — I10 Essential (primary) hypertension: Secondary | ICD-10-CM | POA: Diagnosis not present

## 2018-02-15 ENCOUNTER — Other Ambulatory Visit: Payer: Self-pay

## 2018-02-15 DIAGNOSIS — I739 Peripheral vascular disease, unspecified: Secondary | ICD-10-CM

## 2018-02-15 DIAGNOSIS — I4891 Unspecified atrial fibrillation: Secondary | ICD-10-CM | POA: Diagnosis not present

## 2018-02-15 DIAGNOSIS — S3210XA Unspecified fracture of sacrum, initial encounter for closed fracture: Secondary | ICD-10-CM | POA: Diagnosis not present

## 2018-02-15 DIAGNOSIS — S32591A Other specified fracture of right pubis, initial encounter for closed fracture: Secondary | ICD-10-CM | POA: Diagnosis not present

## 2018-02-15 DIAGNOSIS — R278 Other lack of coordination: Secondary | ICD-10-CM | POA: Diagnosis not present

## 2018-02-15 DIAGNOSIS — E785 Hyperlipidemia, unspecified: Secondary | ICD-10-CM | POA: Diagnosis not present

## 2018-02-15 DIAGNOSIS — I10 Essential (primary) hypertension: Secondary | ICD-10-CM | POA: Diagnosis not present

## 2018-02-15 DIAGNOSIS — D649 Anemia, unspecified: Secondary | ICD-10-CM | POA: Diagnosis not present

## 2018-02-15 DIAGNOSIS — M810 Age-related osteoporosis without current pathological fracture: Secondary | ICD-10-CM | POA: Diagnosis not present

## 2018-02-15 DIAGNOSIS — R110 Nausea: Secondary | ICD-10-CM | POA: Diagnosis not present

## 2018-02-15 DIAGNOSIS — R471 Dysarthria and anarthria: Secondary | ICD-10-CM | POA: Diagnosis not present

## 2018-02-15 DIAGNOSIS — Z7409 Other reduced mobility: Secondary | ICD-10-CM | POA: Diagnosis not present

## 2018-02-15 DIAGNOSIS — R262 Difficulty in walking, not elsewhere classified: Secondary | ICD-10-CM | POA: Diagnosis not present

## 2018-02-15 DIAGNOSIS — G894 Chronic pain syndrome: Secondary | ICD-10-CM | POA: Diagnosis not present

## 2018-02-15 DIAGNOSIS — M009 Pyogenic arthritis, unspecified: Secondary | ICD-10-CM | POA: Diagnosis not present

## 2018-02-15 DIAGNOSIS — Z9181 History of falling: Secondary | ICD-10-CM | POA: Diagnosis not present

## 2018-02-15 DIAGNOSIS — S32599A Other specified fracture of unspecified pubis, initial encounter for closed fracture: Secondary | ICD-10-CM | POA: Diagnosis not present

## 2018-02-15 DIAGNOSIS — S3210XD Unspecified fracture of sacrum, subsequent encounter for fracture with routine healing: Secondary | ICD-10-CM | POA: Diagnosis not present

## 2018-02-15 DIAGNOSIS — R1084 Generalized abdominal pain: Secondary | ICD-10-CM | POA: Diagnosis not present

## 2018-02-15 DIAGNOSIS — R8271 Bacteriuria: Secondary | ICD-10-CM | POA: Diagnosis not present

## 2018-02-15 DIAGNOSIS — G8929 Other chronic pain: Secondary | ICD-10-CM | POA: Diagnosis not present

## 2018-02-15 DIAGNOSIS — R1313 Dysphagia, pharyngeal phase: Secondary | ICD-10-CM | POA: Diagnosis not present

## 2018-02-15 DIAGNOSIS — Z741 Need for assistance with personal care: Secondary | ICD-10-CM | POA: Diagnosis not present

## 2018-02-15 DIAGNOSIS — M6281 Muscle weakness (generalized): Secondary | ICD-10-CM | POA: Diagnosis not present

## 2018-02-15 DIAGNOSIS — I8291 Chronic embolism and thrombosis of unspecified vein: Secondary | ICD-10-CM | POA: Diagnosis not present

## 2018-02-15 DIAGNOSIS — Z79899 Other long term (current) drug therapy: Secondary | ICD-10-CM | POA: Diagnosis not present

## 2018-02-15 DIAGNOSIS — K219 Gastro-esophageal reflux disease without esophagitis: Secondary | ICD-10-CM | POA: Diagnosis not present

## 2018-02-15 DIAGNOSIS — T82898A Other specified complication of vascular prosthetic devices, implants and grafts, initial encounter: Secondary | ICD-10-CM

## 2018-02-15 DIAGNOSIS — S32599D Other specified fracture of unspecified pubis, subsequent encounter for fracture with routine healing: Secondary | ICD-10-CM | POA: Diagnosis not present

## 2018-02-15 DIAGNOSIS — Z86711 Personal history of pulmonary embolism: Secondary | ICD-10-CM | POA: Diagnosis not present

## 2018-02-15 DIAGNOSIS — J449 Chronic obstructive pulmonary disease, unspecified: Secondary | ICD-10-CM | POA: Diagnosis not present

## 2018-02-16 DIAGNOSIS — Z79899 Other long term (current) drug therapy: Secondary | ICD-10-CM | POA: Diagnosis not present

## 2018-02-16 DIAGNOSIS — S3210XA Unspecified fracture of sacrum, initial encounter for closed fracture: Secondary | ICD-10-CM | POA: Diagnosis not present

## 2018-02-16 DIAGNOSIS — I4891 Unspecified atrial fibrillation: Secondary | ICD-10-CM | POA: Diagnosis not present

## 2018-02-16 DIAGNOSIS — R8271 Bacteriuria: Secondary | ICD-10-CM | POA: Diagnosis not present

## 2018-02-16 DIAGNOSIS — Z86711 Personal history of pulmonary embolism: Secondary | ICD-10-CM | POA: Diagnosis not present

## 2018-02-16 DIAGNOSIS — G8929 Other chronic pain: Secondary | ICD-10-CM | POA: Diagnosis not present

## 2018-02-16 DIAGNOSIS — S32599A Other specified fracture of unspecified pubis, initial encounter for closed fracture: Secondary | ICD-10-CM | POA: Diagnosis not present

## 2018-02-16 DIAGNOSIS — R1084 Generalized abdominal pain: Secondary | ICD-10-CM | POA: Diagnosis not present

## 2018-02-16 DIAGNOSIS — R262 Difficulty in walking, not elsewhere classified: Secondary | ICD-10-CM | POA: Diagnosis not present

## 2018-02-16 DIAGNOSIS — I10 Essential (primary) hypertension: Secondary | ICD-10-CM | POA: Diagnosis not present

## 2018-02-16 DIAGNOSIS — G894 Chronic pain syndrome: Secondary | ICD-10-CM | POA: Diagnosis not present

## 2018-02-16 DIAGNOSIS — S32591A Other specified fracture of right pubis, initial encounter for closed fracture: Secondary | ICD-10-CM | POA: Diagnosis not present

## 2018-02-16 DIAGNOSIS — J449 Chronic obstructive pulmonary disease, unspecified: Secondary | ICD-10-CM | POA: Diagnosis not present

## 2018-02-23 DIAGNOSIS — G894 Chronic pain syndrome: Secondary | ICD-10-CM | POA: Diagnosis not present

## 2018-02-23 DIAGNOSIS — R8271 Bacteriuria: Secondary | ICD-10-CM | POA: Diagnosis not present

## 2018-02-23 DIAGNOSIS — M009 Pyogenic arthritis, unspecified: Secondary | ICD-10-CM | POA: Diagnosis not present

## 2018-02-23 DIAGNOSIS — I739 Peripheral vascular disease, unspecified: Secondary | ICD-10-CM | POA: Diagnosis not present

## 2018-02-24 DIAGNOSIS — S3210XA Unspecified fracture of sacrum, initial encounter for closed fracture: Secondary | ICD-10-CM | POA: Diagnosis not present

## 2018-02-24 DIAGNOSIS — M810 Age-related osteoporosis without current pathological fracture: Secondary | ICD-10-CM | POA: Diagnosis not present

## 2018-02-24 DIAGNOSIS — M009 Pyogenic arthritis, unspecified: Secondary | ICD-10-CM | POA: Diagnosis not present

## 2018-02-28 ENCOUNTER — Telehealth: Payer: Self-pay | Admitting: *Deleted

## 2018-02-28 NOTE — Telephone Encounter (Signed)
Patient daughter called and explained that since her last visit she has fallen and has a fractured pelvis and other injuries and she has an office visit here 03/02/18. Daughter is wondering if she needs to come as it is very painful for her to ride and she has had labs last week 02/23/18. She is having the labs sent to our office to review and see if she needs to continue the antibiotics. She advised her mother is at [facility]) under the care of Dr Posey Rea [phone].   Patient daughter [name] request a call to see hat the next step is please.

## 2018-03-01 ENCOUNTER — Encounter (HOSPITAL_COMMUNITY): Payer: Medicare Other

## 2018-03-01 ENCOUNTER — Ambulatory Visit: Payer: Medicare Other | Admitting: Vascular Surgery

## 2018-03-01 NOTE — Telephone Encounter (Signed)
No need to come in but let me review her info tonite to see if still need abtx

## 2018-03-01 NOTE — Telephone Encounter (Signed)
Patient daughter called to see if she still needs to be on antibiotics any longer?

## 2018-03-02 ENCOUNTER — Ambulatory Visit: Payer: Medicare Other | Admitting: Internal Medicine

## 2018-03-03 NOTE — Telephone Encounter (Signed)
Patient daughter informed that appointment can be canceled and that Dr Baxter Flattery is looking into the antibiotics and we will call once she makes a decision.

## 2018-03-04 NOTE — Telephone Encounter (Signed)
Patient's daughter calling again for antibiotic decision. Please advise. Landis Gandy, RN

## 2018-03-11 ENCOUNTER — Other Ambulatory Visit: Payer: Self-pay

## 2018-03-11 NOTE — Patient Outreach (Signed)
[facility]) Care Management  03/11/2018  Courtney Grant Jun 10, 1932 [id number]   Medication Adherence call to Courtney Grant left a message for patient to call back patient is due on Atorvastatin 20 mg.Courtney Grant is showing past due under [place].   [facility] Management Direct Dial [phone]  Fax [phone] [email or web address]

## 2018-03-12 DIAGNOSIS — D649 Anemia, unspecified: Secondary | ICD-10-CM | POA: Diagnosis not present

## 2018-03-12 DIAGNOSIS — J449 Chronic obstructive pulmonary disease, unspecified: Secondary | ICD-10-CM | POA: Diagnosis not present

## 2018-03-12 DIAGNOSIS — I10 Essential (primary) hypertension: Secondary | ICD-10-CM | POA: Diagnosis not present

## 2018-03-12 DIAGNOSIS — K219 Gastro-esophageal reflux disease without esophagitis: Secondary | ICD-10-CM | POA: Diagnosis not present

## 2018-03-14 NOTE — Telephone Encounter (Signed)
Per Dr Baxter Flattery called [facility] [phone] and advised to D/C the oral Keflex for the patient and draw a Sed Rate and CRP and fax results to the office. Will call patient daughter as well.

## 2018-03-14 NOTE — Telephone Encounter (Signed)
Called [name] the patient daughter to advise that Dr Baxter Flattery has stopped the Keflex and asked for a Sed rate and CRP to be done and results faxed to the office. She also wants a 4-6 week follow up. Scheduled the follow up for 04/21/18 with [facility].

## 2018-03-16 DIAGNOSIS — Z7409 Other reduced mobility: Secondary | ICD-10-CM | POA: Diagnosis not present

## 2018-03-16 DIAGNOSIS — S3210XD Unspecified fracture of sacrum, subsequent encounter for fracture with routine healing: Secondary | ICD-10-CM | POA: Diagnosis not present

## 2018-03-16 DIAGNOSIS — R471 Dysarthria and anarthria: Secondary | ICD-10-CM | POA: Diagnosis not present

## 2018-03-16 DIAGNOSIS — G894 Chronic pain syndrome: Secondary | ICD-10-CM | POA: Diagnosis not present

## 2018-03-16 DIAGNOSIS — E785 Hyperlipidemia, unspecified: Secondary | ICD-10-CM | POA: Diagnosis not present

## 2018-03-16 DIAGNOSIS — M6281 Muscle weakness (generalized): Secondary | ICD-10-CM | POA: Diagnosis not present

## 2018-03-16 DIAGNOSIS — R1313 Dysphagia, pharyngeal phase: Secondary | ICD-10-CM | POA: Diagnosis not present

## 2018-03-16 DIAGNOSIS — Z741 Need for assistance with personal care: Secondary | ICD-10-CM | POA: Diagnosis not present

## 2018-03-16 DIAGNOSIS — S3210XA Unspecified fracture of sacrum, initial encounter for closed fracture: Secondary | ICD-10-CM | POA: Diagnosis not present

## 2018-03-16 DIAGNOSIS — I8291 Chronic embolism and thrombosis of unspecified vein: Secondary | ICD-10-CM | POA: Diagnosis not present

## 2018-03-16 DIAGNOSIS — K219 Gastro-esophageal reflux disease without esophagitis: Secondary | ICD-10-CM | POA: Diagnosis not present

## 2018-03-16 DIAGNOSIS — R1084 Generalized abdominal pain: Secondary | ICD-10-CM | POA: Diagnosis not present

## 2018-03-16 DIAGNOSIS — I4891 Unspecified atrial fibrillation: Secondary | ICD-10-CM | POA: Diagnosis not present

## 2018-03-16 DIAGNOSIS — I10 Essential (primary) hypertension: Secondary | ICD-10-CM | POA: Diagnosis not present

## 2018-03-16 DIAGNOSIS — S32599D Other specified fracture of unspecified pubis, subsequent encounter for fracture with routine healing: Secondary | ICD-10-CM | POA: Diagnosis not present

## 2018-03-16 DIAGNOSIS — J449 Chronic obstructive pulmonary disease, unspecified: Secondary | ICD-10-CM | POA: Diagnosis not present

## 2018-03-16 DIAGNOSIS — M009 Pyogenic arthritis, unspecified: Secondary | ICD-10-CM | POA: Diagnosis not present

## 2018-03-16 DIAGNOSIS — R8271 Bacteriuria: Secondary | ICD-10-CM | POA: Diagnosis not present

## 2018-03-16 DIAGNOSIS — R278 Other lack of coordination: Secondary | ICD-10-CM | POA: Diagnosis not present

## 2018-03-16 DIAGNOSIS — I739 Peripheral vascular disease, unspecified: Secondary | ICD-10-CM | POA: Diagnosis not present

## 2018-03-16 DIAGNOSIS — Z9181 History of falling: Secondary | ICD-10-CM | POA: Diagnosis not present

## 2018-03-16 DIAGNOSIS — R262 Difficulty in walking, not elsewhere classified: Secondary | ICD-10-CM | POA: Diagnosis not present

## 2018-03-17 DIAGNOSIS — R1313 Dysphagia, pharyngeal phase: Secondary | ICD-10-CM | POA: Diagnosis not present

## 2018-03-17 DIAGNOSIS — S3210XA Unspecified fracture of sacrum, initial encounter for closed fracture: Secondary | ICD-10-CM | POA: Diagnosis not present

## 2018-03-17 DIAGNOSIS — M6281 Muscle weakness (generalized): Secondary | ICD-10-CM | POA: Diagnosis not present

## 2018-03-22 DIAGNOSIS — M1711 Unilateral primary osteoarthritis, right knee: Secondary | ICD-10-CM | POA: Diagnosis not present

## 2018-03-22 DIAGNOSIS — M009 Pyogenic arthritis, unspecified: Secondary | ICD-10-CM | POA: Diagnosis not present

## 2018-03-22 DIAGNOSIS — S3210XA Unspecified fracture of sacrum, initial encounter for closed fracture: Secondary | ICD-10-CM | POA: Diagnosis not present

## 2018-03-23 DIAGNOSIS — M542 Cervicalgia: Secondary | ICD-10-CM | POA: Diagnosis not present

## 2018-03-23 DIAGNOSIS — M545 Low back pain: Secondary | ICD-10-CM | POA: Diagnosis not present

## 2018-03-23 DIAGNOSIS — Z720 Tobacco use: Secondary | ICD-10-CM | POA: Diagnosis not present

## 2018-03-23 DIAGNOSIS — Z79899 Other long term (current) drug therapy: Secondary | ICD-10-CM | POA: Diagnosis not present

## 2018-03-23 DIAGNOSIS — G894 Chronic pain syndrome: Secondary | ICD-10-CM | POA: Diagnosis not present

## 2018-03-25 DIAGNOSIS — I10 Essential (primary) hypertension: Secondary | ICD-10-CM | POA: Diagnosis not present

## 2018-03-25 DIAGNOSIS — E782 Mixed hyperlipidemia: Secondary | ICD-10-CM | POA: Diagnosis not present

## 2018-04-02 DIAGNOSIS — S3210XA Unspecified fracture of sacrum, initial encounter for closed fracture: Secondary | ICD-10-CM | POA: Diagnosis not present

## 2018-04-21 ENCOUNTER — Ambulatory Visit: Payer: Medicare Other | Admitting: Internal Medicine

## 2018-04-22 ENCOUNTER — Ambulatory Visit: Payer: Medicare Other | Admitting: Internal Medicine

## 2018-04-27 ENCOUNTER — Encounter: Payer: Self-pay | Admitting: Internal Medicine

## 2018-04-27 ENCOUNTER — Ambulatory Visit: Payer: Medicare Other | Admitting: Internal Medicine

## 2018-04-27 VITALS — BP 169/78 | HR 90 | Temp 99.5°F | Wt 151.0 lb

## 2018-04-27 DIAGNOSIS — B9561 Methicillin susceptible Staphylococcus aureus infection as the cause of diseases classified elsewhere: Secondary | ICD-10-CM | POA: Diagnosis not present

## 2018-04-27 DIAGNOSIS — T8453XD Infection and inflammatory reaction due to internal right knee prosthesis, subsequent encounter: Secondary | ICD-10-CM

## 2018-04-27 DIAGNOSIS — J441 Chronic obstructive pulmonary disease with (acute) exacerbation: Secondary | ICD-10-CM

## 2018-04-27 DIAGNOSIS — M00061 Staphylococcal arthritis, right knee: Secondary | ICD-10-CM

## 2018-04-27 DIAGNOSIS — F17210 Nicotine dependence, cigarettes, uncomplicated: Secondary | ICD-10-CM

## 2018-04-27 DIAGNOSIS — Z96651 Presence of right artificial knee joint: Secondary | ICD-10-CM

## 2018-04-27 DIAGNOSIS — Z792 Long term (current) use of antibiotics: Secondary | ICD-10-CM

## 2018-04-27 MED ORDER — PREDNISONE 20 MG PO TABS: 20.0000 mg | ORAL_TABLET | Freq: Every day | ORAL | 0 refills | Status: DC

## 2018-04-27 MED ORDER — METHYLPREDNISOLONE SODIUM SUCC 125 MG IJ SOLR
31.0000 mg | Freq: Once | INTRAMUSCULAR | Status: AC
  Administered 2018-04-27: 31 mg via INTRAMUSCULAR

## 2018-04-27 MED ORDER — GUAIFENESIN ER 600 MG PO TB12: 600.0000 mg | ORAL_TABLET | Freq: Two times a day (BID) | ORAL | 0 refills | Status: DC | PRN

## 2018-04-27 MED ORDER — ALBUTEROL SULFATE HFA 108 (90 BASE) MCG/ACT IN AERS: 2.0000 | INHALATION_SPRAY | Freq: Four times a day (QID) | RESPIRATORY_TRACT | 0 refills | Status: DC | PRN

## 2018-04-27 MED ORDER — CEPHALEXIN 500 MG PO CAPS: 500.0000 mg | ORAL_CAPSULE | Freq: Two times a day (BID) | ORAL | 1 refills | Status: DC

## 2018-04-27 NOTE — Progress Notes (Signed)
Rfv: follow up for chronic infection of MSSA right knee septic arthritis with HW involvement  Patient ID: Courtney Grant, female   DOB: 04/20/1932, 85 y.o.   MRN: [id number]  HPI Courtney Grant is an 85yo F with who had multiple orthopedic procedures who was treated for MSSA infection of right knee and HW involvement in mid august 2019 who has been on chronic suppression with cefazolin and steadily improving her ambulation.  She reports recent cough, sob, wheezing in the last few days.she was notably hypoxic with ambulation  Outpatient Encounter Medications as of 04/27/2018  Medication Sig  . acetaminophen (TYLENOL) 325 MG tablet Take 2 tablets (650 mg total) by mouth every 6 (six) hours as needed for mild pain (or Fever >/= 101).  Marland Kitchen albuterol (VENTOLIN HFA) 108 (90 Base) MCG/ACT inhaler Inhale 2 puffs into the lungs every 6 (six) hours as needed.  Marland Kitchen amitriptyline (ELAVIL) 25 MG tablet Take 25 mg by mouth at bedtime.  Marland Kitchen atorvastatin (LIPITOR) 20 MG tablet Take 20 mg by mouth at bedtime.  . busPIRone (BUSPAR) 7.5 MG tablet Take 7.5 mg by mouth 2 (two) times daily.  . cephALEXin (KEFLEX) 500 MG capsule Take 1 capsule (500 mg total) by mouth 3 (three) times daily.  . clopidogrel (PLAVIX) 75 MG tablet Take 75 mg by mouth daily.  . Cyanocobalamin (B-12 PO) Take 1 tablet by mouth daily.  Marland Kitchen dicyclomine (BENTYL) 20 MG tablet Take 20 mg by mouth 3 (three) times daily as needed for spasms.   . ferrous sulfate 325 (65 FE) MG tablet Take 1 tablet (325 mg total) by mouth daily with breakfast.  . fluconazole (DIFLUCAN) 150 MG tablet Take 1 tablet (150 mg total) by mouth daily. To use if needed for yeast infection. Take 1 pill every 3 days if needed  . gabapentin (NEURONTIN) 100 MG capsule Take 100 mg by mouth 2 (two) times daily.   Marland Kitchen lactobacillus acidophilus & bulgar (LACTINEX) chewable tablet Chew 1 tablet by mouth 3 (three) times daily with meals.  Marland Kitchen loratadine (CLARITIN) 10 MG tablet Take 10 mg by mouth  daily.  . metoprolol (LOPRESSOR) 50 MG tablet Take 50 mg by mouth 2 (two) times daily.   . Multiple Minerals-Vitamins (CALCIUM & VIT D3 BONE HEALTH PO) Take 1 tablet by mouth daily.  . Multiple Vitamins-Minerals (ICAPS AREDS 2 PO) Take 2 tablets by mouth daily.  Marland Kitchen oxyCODONE (OXYCONTIN) 15 mg 12 hr tablet Take 1 tablet (15 mg total) by mouth 2 (two) times daily.  . Oxycodone HCl 10 MG TABS Take 1 tablet (10 mg total) by mouth every 6 (six) hours as needed (SEVERE PAIN).  Marland Kitchen pantoprazole (PROTONIX) 40 MG tablet Take 40 mg by mouth daily.   . Potassium 99 MG TABS Take 99 mg by mouth daily.  . sertraline (ZOLOFT) 100 MG tablet Take 100 mg by mouth daily.  . sucralfate (CARAFATE) 1 G tablet Take 1 g by mouth 4 (four) times daily -  with meals and at bedtime.   . traZODone (DESYREL) 50 MG tablet Take 50 mg by mouth at bedtime.   No facility-administered encounter medications on file as of 04/27/2018.      Patient Active Problem List   Diagnosis Date Noted  . Bacteremia due to methicillin susceptible Staphylococcus aureus (MSSA) 10/30/2017  . Occlusion of right femoral-popliteal bypass graft ([place]) 10/30/2017  . Chronic back pain 10/30/2017  . Normocytic anemia 10/30/2017  . Septic arthritis of knee, right ([place]) 10/30/2017  . History  of bleeding peptic ulcer 10/30/2017  . Abnormal transaminases   . Arthritis 01/29/2016  . Biliary dyskinesia 01/29/2016  . Depression 01/29/2016  . Fibromyalgia 01/29/2016  . Hiatal hernia with GERD 01/29/2016  . Hypertension 01/29/2016  . PONV (postoperative nausea and vomiting) 01/29/2016  . History of pulmonary embolism 01/29/2016  . Stroke ([place]) 01/29/2016  . Urgency incontinence 01/29/2016  . Benign paroxysmal positional vertigo 10/25/2015  . COPD (chronic obstructive pulmonary disease) ([place])   . Tobacco abuse   . Cancer of lung [facility])      Health Maintenance Due  Topic Date Due  . TETANUS/TDAP  10/24/1953  . DEXA SCAN  10/25/1999  . INFLUENZA  VACCINE  10/14/2017    Social History   Tobacco Use  . Smoking status: Current Every Day Smoker    Packs/day: 0.50    Types: Cigarettes  . Smokeless tobacco: Never Used  . Tobacco comment: smoking sinceshewas 16  Substance Use Topics  . Alcohol use: Yes    Comment: Couple of drinks per day  . Drug use: No   Review of Systems 12 point ros is negative except what is mentioned above Physical Exam BP (!) 169/78   Pulse 90   Temp 99.5 F (37.5 C) (Oral)   Wt 151 lb (68.5 kg)   SpO2 95% Comment: 80% room air after walking  BMI 26.75 kg/m  Physical Exam  Constitutional:  oriented to person, place, and time. appears well-developed and well-nourished. No distress.  HENT: [place]/AT, PERRLA, no scleral icterus Mouth/Throat: Oropharynx is clear and moist. No oropharyngeal exudate.  Cardiovascular: Normal rate, regular rhythm and normal heart sounds. Exam reveals no gallop and no friction rub.  No murmur heard.  Pulmonary/Chest: Effort normal and breath sounds normal. +wheezing Neck = supple, no nuchal rigidity Abdominal: Soft. Bowel sounds are normal.  exhibits no distension. There is no tenderness.  Lymphadenopathy: no cervical adenopathy. No axillary adenopathy Neurological: alert and oriented to person, place, and time.  Skin: Skin is warm and dry. No rash noted. No erythema.  Psychiatric: a normal mood and affect.  behavior is normal.   CBC Lab Results  Component Value Date   WBC 13.2 (H) 11/29/2017   RBC 3.95 11/29/2017   HGB 11.2 (L) 11/29/2017   HCT 33.8 (L) 11/29/2017   PLT 338 11/29/2017   MCV 85.6 11/29/2017   MCH 28.4 11/29/2017   MCHC 33.1 11/29/2017   RDW 14.6 11/29/2017   LYMPHSABS 2,138 11/29/2017   MONOABS 1.1 (H) 10/30/2017   EOSABS 422 11/29/2017    BMET Lab Results  Component Value Date   NA 138 11/29/2017   K 4.1 11/29/2017   CL 100 11/29/2017   CO2 29 11/29/2017   GLUCOSE 87 11/29/2017   BUN 14 11/29/2017   CREATININE 0.57 (L) 11/29/2017    CALCIUM 9.0 11/29/2017   GFRNONAA >60 11/11/2017   GFRAA >60 11/11/2017      Assessment and Plan - will treat for copd exacerbation, will give steroid injection in clinic plus 5 d of prednisone to see if improves her symptoms, plus refill albuterol inhaler, plus mucinex - will treat with cephalexin for chronic suppression

## 2018-04-28 LAB — CBC WITH DIFFERENTIAL/PLATELET
Absolute Monocytes: 634 {cells}/uL (ref 200–950)
Basophils Absolute: 38 {cells}/uL (ref 0–200)
Basophils Relative: 0.4 %
Eosinophils Absolute: 106 {cells}/uL (ref 15–500)
Eosinophils Relative: 1.1 %
HCT: 37.1 % (ref 35.0–45.0)
Hemoglobin: 12.3 g/dL (ref 11.7–15.5)
Lymphs Abs: 1430 {cells}/uL (ref 850–3900)
MCH: 28.4 pg (ref 27.0–33.0)
MCHC: 33.2 g/dL (ref 32.0–36.0)
MCV: 85.7 fL (ref 80.0–100.0)
MPV: 10.7 fL (ref 7.5–12.5)
Monocytes Relative: 6.6 %
Neutro Abs: 7392 {cells}/uL (ref 1500–7800)
Neutrophils Relative %: 77 %
Platelets: 354 10*3/uL (ref 140–400)
RBC: 4.33 Million/uL (ref 3.80–5.10)
RDW: 14.1 % (ref 11.0–15.0)
Total Lymphocyte: 14.9 %
WBC: 9.6 10*3/uL (ref 3.8–10.8)

## 2018-04-28 LAB — BASIC METABOLIC PANEL
BUN: 20 mg/dL (ref 7–25)
CO2: 28 mmol/L (ref 20–32)
Calcium: 9.4 mg/dL (ref 8.6–10.4)
Chloride: 101 mmol/L (ref 98–110)
Creat: 0.87 mg/dL (ref 0.60–0.88)
Glucose, Bld: 98 mg/dL (ref 65–99)
Potassium: 4.3 mmol/L (ref 3.5–5.3)
Sodium: 139 mmol/L (ref 135–146)

## 2018-04-28 LAB — SEDIMENTATION RATE: Sed Rate: 67 mm/h — ABNORMAL HIGH (ref 0–30)

## 2018-04-28 LAB — C-REACTIVE PROTEIN: CRP: 16.2 mg/L — ABNORMAL HIGH

## 2018-05-03 DIAGNOSIS — S3210XA Unspecified fracture of sacrum, initial encounter for closed fracture: Secondary | ICD-10-CM | POA: Diagnosis not present

## 2018-05-18 DIAGNOSIS — M542 Cervicalgia: Secondary | ICD-10-CM | POA: Diagnosis not present

## 2018-05-18 DIAGNOSIS — Z720 Tobacco use: Secondary | ICD-10-CM | POA: Diagnosis not present

## 2018-05-18 DIAGNOSIS — Z79899 Other long term (current) drug therapy: Secondary | ICD-10-CM | POA: Diagnosis not present

## 2018-05-18 DIAGNOSIS — G894 Chronic pain syndrome: Secondary | ICD-10-CM | POA: Diagnosis not present

## 2018-05-18 DIAGNOSIS — M545 Low back pain: Secondary | ICD-10-CM | POA: Diagnosis not present

## 2018-05-30 ENCOUNTER — Telehealth: Payer: Self-pay | Admitting: Infectious Diseases

## 2018-05-30 NOTE — Telephone Encounter (Signed)
-----   Message from Eugenia Mcalpine, LPN sent at 09/05/6331 11:18 AM EDT ----- Regarding: Advise Patient's daughter called asking if you could review labs to see if she needs to possibly continue Keflex. FYI. Daughter pushed appointment further out just to be on the safe side.

## 2018-05-30 NOTE — Telephone Encounter (Signed)
Yes I would like for her to continue the Cephalexin.  Fine to push out her appointment out for 2 months.  Her labs do look a bit improved but still likely some work to be done with treatment.   Thank you.

## 2018-05-31 ENCOUNTER — Ambulatory Visit: Payer: Medicare Other | Admitting: Infectious Diseases

## 2018-05-31 ENCOUNTER — Other Ambulatory Visit: Payer: Self-pay

## 2018-05-31 MED ORDER — CEPHALEXIN 500 MG PO CAPS: 500.0000 mg | ORAL_CAPSULE | Freq: Two times a day (BID) | ORAL | 1 refills | Status: DC

## 2018-05-31 NOTE — Progress Notes (Signed)
Per Abbey Chatters, NP continue Keflex 500mg  one cap twice a day

## 2018-05-31 NOTE — Telephone Encounter (Signed)
Thank you :)

## 2018-05-31 NOTE — Telephone Encounter (Signed)
LPN called and left Vm with [name] (Daughter) to continue Keflex  500mg  twice a day and to call office to schedule appointment 2 months out (around Jul 31 2018) Eugenia Mcalpine, LPN

## 2018-06-01 DIAGNOSIS — S32599A Other specified fracture of unspecified pubis, initial encounter for closed fracture: Secondary | ICD-10-CM | POA: Diagnosis not present

## 2018-06-01 DIAGNOSIS — S3210XA Unspecified fracture of sacrum, initial encounter for closed fracture: Secondary | ICD-10-CM | POA: Diagnosis not present

## 2018-06-23 ENCOUNTER — Ambulatory Visit: Payer: Medicare Other | Admitting: Infectious Diseases

## 2018-07-13 DIAGNOSIS — G8918 Other acute postprocedural pain: Secondary | ICD-10-CM | POA: Diagnosis not present

## 2018-07-13 DIAGNOSIS — Z79899 Other long term (current) drug therapy: Secondary | ICD-10-CM | POA: Diagnosis not present

## 2018-07-13 DIAGNOSIS — M545 Low back pain: Secondary | ICD-10-CM | POA: Diagnosis not present

## 2018-07-13 DIAGNOSIS — Z720 Tobacco use: Secondary | ICD-10-CM | POA: Diagnosis not present

## 2018-07-13 DIAGNOSIS — G894 Chronic pain syndrome: Secondary | ICD-10-CM | POA: Diagnosis not present

## 2018-08-02 ENCOUNTER — Telehealth: Payer: Self-pay

## 2018-08-02 NOTE — Telephone Encounter (Signed)
Called daughter to remind her of overdue appointment for patient. Daughter would rather have Evisit (scheduled 08/15/18). Daughter would also like labs drawn prior to evist. Routing to provider to make aware.

## 2018-08-03 NOTE — Telephone Encounter (Signed)
Since Dr Baxter Flattery is back in clinic can you please reschedule the patient with her since she has been following her regularly?  Thank you. Labs drawn at her preferred lab site is fine as long as we get results.

## 2018-08-09 ENCOUNTER — Telehealth: Payer: Self-pay | Admitting: *Deleted

## 2018-08-09 NOTE — Telephone Encounter (Signed)
Patient's primary care unable to see patient for lab work until early next week. Please advise if ok to move her evisit appointment from 6/1. You will be next in clinic 6/22. Is that too far? Landis Gandy, RN

## 2018-08-10 NOTE — Telephone Encounter (Signed)
Patient's daughter [name] called to make [facility] aware patient will have labs drawn on 08/29/18 at [facility] after original [place] appointment date on 08/15/18 with Dr. Baxter Flattery. Daughter requests to reschedule 6/1 appointment. New appointment date 09/14/18 with Dr. Baxter Flattery. Daughter made aware of new appointment date/time of E-Visit. Family will be present during call.   LPN called [facility] and spoke with [place] and request office to fax labs to [facility] for review prior to appointment. Eugenia Mcalpine, LPN

## 2018-08-12 ENCOUNTER — Other Ambulatory Visit: Payer: Self-pay | Admitting: *Deleted

## 2018-08-12 DIAGNOSIS — M00061 Staphylococcal arthritis, right knee: Secondary | ICD-10-CM

## 2018-08-12 MED ORDER — CEPHALEXIN 500 MG PO CAPS: 500.0000 mg | ORAL_CAPSULE | Freq: Two times a day (BID) | ORAL | 1 refills | Status: DC

## 2018-08-12 NOTE — Telephone Encounter (Signed)
Received request for refill of Keflex.  Ok to refill until labs/appointment 7/1 with Dr Baxter Flattery? Landis Gandy, RN

## 2018-08-12 NOTE — Telephone Encounter (Signed)
Refilled. Thanks!

## 2018-08-12 NOTE — Telephone Encounter (Signed)
Yes please refill thank you

## 2018-08-15 ENCOUNTER — Ambulatory Visit: Payer: Medicare Other | Admitting: Infectious Diseases

## 2018-08-29 DIAGNOSIS — I10 Essential (primary) hypertension: Secondary | ICD-10-CM | POA: Diagnosis not present

## 2018-08-29 DIAGNOSIS — B9561 Methicillin susceptible Staphylococcus aureus infection as the cause of diseases classified elsewhere: Secondary | ICD-10-CM | POA: Diagnosis not present

## 2018-08-29 DIAGNOSIS — R7881 Bacteremia: Secondary | ICD-10-CM | POA: Diagnosis not present

## 2018-08-29 DIAGNOSIS — E782 Mixed hyperlipidemia: Secondary | ICD-10-CM | POA: Diagnosis not present

## 2018-08-29 DIAGNOSIS — Z5181 Encounter for therapeutic drug level monitoring: Secondary | ICD-10-CM | POA: Diagnosis not present

## 2018-08-29 DIAGNOSIS — Z7689 Persons encountering health services in other specified circumstances: Secondary | ICD-10-CM | POA: Diagnosis not present

## 2018-09-05 DIAGNOSIS — M65331 Trigger finger, right middle finger: Secondary | ICD-10-CM | POA: Diagnosis not present

## 2018-09-05 DIAGNOSIS — M65332 Trigger finger, left middle finger: Secondary | ICD-10-CM | POA: Diagnosis not present

## 2018-09-07 DIAGNOSIS — M545 Low back pain: Secondary | ICD-10-CM | POA: Diagnosis not present

## 2018-09-07 DIAGNOSIS — G8918 Other acute postprocedural pain: Secondary | ICD-10-CM | POA: Diagnosis not present

## 2018-09-07 DIAGNOSIS — Z720 Tobacco use: Secondary | ICD-10-CM | POA: Diagnosis not present

## 2018-09-07 DIAGNOSIS — G894 Chronic pain syndrome: Secondary | ICD-10-CM | POA: Diagnosis not present

## 2018-09-14 ENCOUNTER — Ambulatory Visit (INDEPENDENT_AMBULATORY_CARE_PROVIDER_SITE_OTHER): Payer: Medicare Other | Admitting: Internal Medicine

## 2018-09-14 ENCOUNTER — Other Ambulatory Visit: Payer: Self-pay | Admitting: *Deleted

## 2018-09-14 ENCOUNTER — Other Ambulatory Visit: Payer: Self-pay

## 2018-09-14 ENCOUNTER — Encounter: Payer: Self-pay | Admitting: Internal Medicine

## 2018-09-14 DIAGNOSIS — M00061 Staphylococcal arthritis, right knee: Secondary | ICD-10-CM

## 2018-09-14 DIAGNOSIS — Z79899 Other long term (current) drug therapy: Secondary | ICD-10-CM

## 2018-09-14 MED ORDER — CEPHALEXIN 500 MG PO CAPS: 500.0000 mg | ORAL_CAPSULE | Freq: Two times a day (BID) | ORAL | 1 refills | Status: DC

## 2018-09-14 NOTE — Progress Notes (Signed)
RFV: chronic pji infection -tele-visit  Patient ID: Courtney Grant, female   DOB: 1932/09/21, 85 y.o.   MRN: [id number]  HPI Courtney Grant is an 85yo F with who had multiple orthopedic procedures who was treated for MSSA infection of right knee and HW involvement in mid august 2019 who has been on chronic suppression with cephalexin 500mg  bid.  Has seen dr Reesa Chew- in follow up. Doing well  Reviewed labs from outside records    Outpatient Encounter Medications as of 09/14/2018  Medication Sig  . acetaminophen (TYLENOL) 325 MG tablet Take 2 tablets (650 mg total) by mouth every 6 (six) hours as needed for mild pain (or Fever >/= 101).  Marland Kitchen albuterol (VENTOLIN HFA) 108 (90 Base) MCG/ACT inhaler Inhale 2 puffs into the lungs every 6 (six) hours as needed.  Marland Kitchen amitriptyline (ELAVIL) 25 MG tablet Take 25 mg by mouth at bedtime.  Marland Kitchen atorvastatin (LIPITOR) 20 MG tablet Take 20 mg by mouth at bedtime.  . busPIRone (BUSPAR) 7.5 MG tablet Take 7.5 mg by mouth 2 (two) times daily.  . cephALEXin (KEFLEX) 500 MG capsule Take 1 capsule (500 mg total) by mouth 2 (two) times daily. One capsule  twice a day  . clopidogrel (PLAVIX) 75 MG tablet Take 75 mg by mouth daily.  . Cyanocobalamin (B-12 PO) Take 1 tablet by mouth daily.  Marland Kitchen dicyclomine (BENTYL) 20 MG tablet Take 20 mg by mouth 3 (three) times daily as needed for spasms.   . ferrous sulfate 325 (65 FE) MG tablet Take 1 tablet (325 mg total) by mouth daily with breakfast.  . fluconazole (DIFLUCAN) 150 MG tablet Take 1 tablet (150 mg total) by mouth daily. To use if needed for yeast infection. Take 1 pill every 3 days if needed  . gabapentin (NEURONTIN) 100 MG capsule Take 100 mg by mouth 2 (two) times daily.   Marland Kitchen guaiFENesin (MUCINEX) 600 MG 12 hr tablet Take 1 tablet (600 mg total) by mouth 2 (two) times daily as needed for to loosen phlegm.  . lactobacillus acidophilus & bulgar (LACTINEX) chewable tablet Chew 1 tablet by mouth 3 (three) times daily with meals.  Marland Kitchen  loratadine (CLARITIN) 10 MG tablet Take 10 mg by mouth daily.  . metoprolol (LOPRESSOR) 50 MG tablet Take 50 mg by mouth 2 (two) times daily.   . Multiple Minerals-Vitamins (CALCIUM & VIT D3 BONE HEALTH PO) Take 1 tablet by mouth daily.  . Multiple Vitamins-Minerals (ICAPS AREDS 2 PO) Take 2 tablets by mouth daily.  Marland Kitchen oxyCODONE (OXYCONTIN) 15 mg 12 hr tablet Take 1 tablet (15 mg total) by mouth 2 (two) times daily.  . Oxycodone HCl 10 MG TABS Take 1 tablet (10 mg total) by mouth every 6 (six) hours as needed (SEVERE PAIN).  Marland Kitchen pantoprazole (PROTONIX) 40 MG tablet Take 40 mg by mouth daily.   . Potassium 99 MG TABS Take 99 mg by mouth daily.  . predniSONE (DELTASONE) 20 MG tablet Take 1 tablet (20 mg total) by mouth daily with breakfast. X 5 days and then 1/2 tab x 4 days starting on thurs.  . sertraline (ZOLOFT) 100 MG tablet Take 100 mg by mouth daily.  . sucralfate (CARAFATE) 1 G tablet Take 1 g by mouth 4 (four) times daily -  with meals and at bedtime.   . traZODone (DESYREL) 50 MG tablet Take 50 mg by mouth at bedtime.   No facility-administered encounter medications on file as of 09/14/2018.      Patient Active  Problem List   Diagnosis Date Noted  . Bacteremia due to methicillin susceptible Staphylococcus aureus (MSSA) 10/30/2017  . Occlusion of right femoral-popliteal bypass graft ([place]) 10/30/2017  . Chronic back pain 10/30/2017  . Normocytic anemia 10/30/2017  . Septic arthritis of knee, right ([place]) 10/30/2017  . History of bleeding peptic ulcer 10/30/2017  . Abnormal transaminases   . Arthritis 01/29/2016  . Biliary dyskinesia 01/29/2016  . Depression 01/29/2016  . Fibromyalgia 01/29/2016  . Hiatal hernia with GERD 01/29/2016  . Hypertension 01/29/2016  . PONV (postoperative nausea and vomiting) 01/29/2016  . History of pulmonary embolism 01/29/2016  . Stroke ([place]) 01/29/2016  . Urgency incontinence 01/29/2016  . Benign paroxysmal positional vertigo 10/25/2015  . COPD  (chronic obstructive pulmonary disease) ([place])   . Tobacco abuse   . Cancer of lung [facility])      Health Maintenance Due  Topic Date Due  . TETANUS/TDAP  10/24/1953  . DEXA SCAN  10/25/1999     Review of Systems 12 point ros is negative  Physical Exam  Physical Exam  Constitutional:  oriented to person, place, and time. appears well-developed and well-nourished. No distress.  HENT: [place]/AT, PERRLA, no scleral icterus Mouth/Throat: Oropharynx is clear and moist. No oropharyngeal exudate.  Cardiovascular: Normal rate, regular rhythm and normal heart sounds. Exam reveals no gallop and no friction rub.  No murmur heard.  Pulmonary/Chest: Effort normal and breath sounds normal. No respiratory distress.  has no wheezes.  Neurological: alert and oriented to person, place, and time.  Skin: Skin is warm and dry. No rash noted. No erythema.  Psychiatric: a normal mood and affect.  behavior is normal.   CBC Lab Results  Component Value Date   WBC 9.6 04/27/2018   RBC 4.33 04/27/2018   HGB 12.3 04/27/2018   HCT 37.1 04/27/2018   PLT 354 04/27/2018   MCV 85.7 04/27/2018   MCH 28.4 04/27/2018   MCHC 33.2 04/27/2018   RDW 14.1 04/27/2018   LYMPHSABS 1,430 04/27/2018   MONOABS 1.1 (H) 10/30/2017   EOSABS 106 04/27/2018    BMET Lab Results  Component Value Date   NA 139 04/27/2018   K 4.3 04/27/2018   CL 101 04/27/2018   CO2 28 04/27/2018   GLUCOSE 98 04/27/2018   BUN 20 04/27/2018   CREATININE 0.87 04/27/2018   CALCIUM 9.4 04/27/2018   GFRNONAA >60 11/11/2017   GFRAA >60 11/11/2017   Lab Results  Component Value Date   ESRSEDRATE 67 (H) 04/27/2018      Assessment and Plan  Long term suppression for hx of MSSA infection of right knee and HW involvement  Continue keflex 500mg  twice aday.   Long term medication management = will check cr to ensure stable

## 2018-09-29 DIAGNOSIS — M65331 Trigger finger, right middle finger: Secondary | ICD-10-CM | POA: Diagnosis not present

## 2018-09-29 DIAGNOSIS — M65332 Trigger finger, left middle finger: Secondary | ICD-10-CM | POA: Diagnosis not present

## 2018-10-05 DIAGNOSIS — G8918 Other acute postprocedural pain: Secondary | ICD-10-CM | POA: Diagnosis not present

## 2018-10-05 DIAGNOSIS — M542 Cervicalgia: Secondary | ICD-10-CM | POA: Diagnosis not present

## 2018-10-05 DIAGNOSIS — M545 Low back pain: Secondary | ICD-10-CM | POA: Diagnosis not present

## 2018-10-05 DIAGNOSIS — G894 Chronic pain syndrome: Secondary | ICD-10-CM | POA: Diagnosis not present

## 2018-10-07 DIAGNOSIS — I10 Essential (primary) hypertension: Secondary | ICD-10-CM | POA: Diagnosis not present

## 2018-10-07 DIAGNOSIS — I672 Cerebral atherosclerosis: Secondary | ICD-10-CM | POA: Diagnosis not present

## 2018-10-07 DIAGNOSIS — Z7982 Long term (current) use of aspirin: Secondary | ICD-10-CM | POA: Diagnosis not present

## 2018-10-07 DIAGNOSIS — H903 Sensorineural hearing loss, bilateral: Secondary | ICD-10-CM | POA: Diagnosis not present

## 2018-10-07 DIAGNOSIS — K219 Gastro-esophageal reflux disease without esophagitis: Secondary | ICD-10-CM | POA: Diagnosis not present

## 2018-10-07 DIAGNOSIS — G4700 Insomnia, unspecified: Secondary | ICD-10-CM | POA: Diagnosis not present

## 2018-10-07 DIAGNOSIS — M65322 Trigger finger, left index finger: Secondary | ICD-10-CM | POA: Diagnosis not present

## 2018-10-07 DIAGNOSIS — M797 Fibromyalgia: Secondary | ICD-10-CM | POA: Diagnosis not present

## 2018-10-07 DIAGNOSIS — M65332 Trigger finger, left middle finger: Secondary | ICD-10-CM | POA: Diagnosis not present

## 2018-10-07 DIAGNOSIS — Z79899 Other long term (current) drug therapy: Secondary | ICD-10-CM | POA: Diagnosis not present

## 2018-10-07 DIAGNOSIS — M1990 Unspecified osteoarthritis, unspecified site: Secondary | ICD-10-CM | POA: Diagnosis not present

## 2018-10-07 DIAGNOSIS — J449 Chronic obstructive pulmonary disease, unspecified: Secondary | ICD-10-CM | POA: Diagnosis not present

## 2018-10-07 DIAGNOSIS — Z8673 Personal history of transient ischemic attack (TIA), and cerebral infarction without residual deficits: Secondary | ICD-10-CM | POA: Diagnosis not present

## 2018-10-07 DIAGNOSIS — Z86711 Personal history of pulmonary embolism: Secondary | ICD-10-CM | POA: Diagnosis not present

## 2018-10-07 DIAGNOSIS — Z79891 Long term (current) use of opiate analgesic: Secondary | ICD-10-CM | POA: Diagnosis not present

## 2018-10-07 DIAGNOSIS — M65331 Trigger finger, right middle finger: Secondary | ICD-10-CM | POA: Diagnosis not present

## 2018-10-12 DIAGNOSIS — G894 Chronic pain syndrome: Secondary | ICD-10-CM | POA: Diagnosis not present

## 2018-10-12 DIAGNOSIS — Z720 Tobacco use: Secondary | ICD-10-CM | POA: Diagnosis not present

## 2018-10-12 DIAGNOSIS — M545 Low back pain: Secondary | ICD-10-CM | POA: Diagnosis not present

## 2018-10-12 DIAGNOSIS — G8918 Other acute postprocedural pain: Secondary | ICD-10-CM | POA: Diagnosis not present

## 2018-12-02 DIAGNOSIS — S3210XA Unspecified fracture of sacrum, initial encounter for closed fracture: Secondary | ICD-10-CM | POA: Diagnosis not present

## 2018-12-02 DIAGNOSIS — S32599A Other specified fracture of unspecified pubis, initial encounter for closed fracture: Secondary | ICD-10-CM | POA: Diagnosis not present

## 2018-12-21 DIAGNOSIS — M545 Low back pain: Secondary | ICD-10-CM | POA: Diagnosis not present

## 2018-12-21 DIAGNOSIS — G8918 Other acute postprocedural pain: Secondary | ICD-10-CM | POA: Diagnosis not present

## 2018-12-21 DIAGNOSIS — Z720 Tobacco use: Secondary | ICD-10-CM | POA: Diagnosis not present

## 2018-12-21 DIAGNOSIS — G894 Chronic pain syndrome: Secondary | ICD-10-CM | POA: Diagnosis not present

## 2019-01-01 DIAGNOSIS — S3210XA Unspecified fracture of sacrum, initial encounter for closed fracture: Secondary | ICD-10-CM | POA: Diagnosis not present

## 2019-01-01 DIAGNOSIS — S32599A Other specified fracture of unspecified pubis, initial encounter for closed fracture: Secondary | ICD-10-CM | POA: Diagnosis not present

## 2019-01-06 DIAGNOSIS — I10 Essential (primary) hypertension: Secondary | ICD-10-CM | POA: Diagnosis not present

## 2019-01-06 DIAGNOSIS — L89892 Pressure ulcer of other site, stage 2: Secondary | ICD-10-CM | POA: Diagnosis not present

## 2019-01-06 DIAGNOSIS — Z23 Encounter for immunization: Secondary | ICD-10-CM | POA: Diagnosis not present

## 2019-01-18 DIAGNOSIS — M545 Low back pain: Secondary | ICD-10-CM | POA: Diagnosis not present

## 2019-01-18 DIAGNOSIS — G894 Chronic pain syndrome: Secondary | ICD-10-CM | POA: Diagnosis not present

## 2019-01-18 DIAGNOSIS — G8918 Other acute postprocedural pain: Secondary | ICD-10-CM | POA: Diagnosis not present

## 2019-01-18 DIAGNOSIS — M5440 Lumbago with sciatica, unspecified side: Secondary | ICD-10-CM | POA: Diagnosis not present

## 2019-02-01 DIAGNOSIS — S32599A Other specified fracture of unspecified pubis, initial encounter for closed fracture: Secondary | ICD-10-CM | POA: Diagnosis not present

## 2019-02-01 DIAGNOSIS — S3210XA Unspecified fracture of sacrum, initial encounter for closed fracture: Secondary | ICD-10-CM | POA: Diagnosis not present

## 2019-02-15 DIAGNOSIS — Z79899 Other long term (current) drug therapy: Secondary | ICD-10-CM | POA: Diagnosis not present

## 2019-02-15 DIAGNOSIS — G894 Chronic pain syndrome: Secondary | ICD-10-CM | POA: Diagnosis not present

## 2019-02-15 DIAGNOSIS — Z720 Tobacco use: Secondary | ICD-10-CM | POA: Diagnosis not present

## 2019-02-15 DIAGNOSIS — G8918 Other acute postprocedural pain: Secondary | ICD-10-CM | POA: Diagnosis not present

## 2019-02-15 DIAGNOSIS — M545 Low back pain: Secondary | ICD-10-CM | POA: Diagnosis not present

## 2019-03-03 DIAGNOSIS — S32599A Other specified fracture of unspecified pubis, initial encounter for closed fracture: Secondary | ICD-10-CM | POA: Diagnosis not present

## 2019-03-03 DIAGNOSIS — S3210XA Unspecified fracture of sacrum, initial encounter for closed fracture: Secondary | ICD-10-CM | POA: Diagnosis not present

## 2019-04-03 DIAGNOSIS — Z1389 Encounter for screening for other disorder: Secondary | ICD-10-CM | POA: Diagnosis not present

## 2019-04-03 DIAGNOSIS — E782 Mixed hyperlipidemia: Secondary | ICD-10-CM | POA: Diagnosis not present

## 2019-04-03 DIAGNOSIS — I739 Peripheral vascular disease, unspecified: Secondary | ICD-10-CM | POA: Diagnosis not present

## 2019-04-03 DIAGNOSIS — I10 Essential (primary) hypertension: Secondary | ICD-10-CM | POA: Diagnosis not present

## 2019-04-03 DIAGNOSIS — S3210XA Unspecified fracture of sacrum, initial encounter for closed fracture: Secondary | ICD-10-CM | POA: Diagnosis not present

## 2019-04-03 DIAGNOSIS — Z0000 Encounter for general adult medical examination without abnormal findings: Secondary | ICD-10-CM | POA: Diagnosis not present

## 2019-04-03 DIAGNOSIS — S32599A Other specified fracture of unspecified pubis, initial encounter for closed fracture: Secondary | ICD-10-CM | POA: Diagnosis not present

## 2019-04-12 DIAGNOSIS — G894 Chronic pain syndrome: Secondary | ICD-10-CM | POA: Diagnosis not present

## 2019-04-12 DIAGNOSIS — M545 Low back pain: Secondary | ICD-10-CM | POA: Diagnosis not present

## 2019-04-12 DIAGNOSIS — Z720 Tobacco use: Secondary | ICD-10-CM | POA: Diagnosis not present

## 2019-04-12 DIAGNOSIS — G8918 Other acute postprocedural pain: Secondary | ICD-10-CM | POA: Diagnosis not present

## 2019-04-12 DIAGNOSIS — Z79899 Other long term (current) drug therapy: Secondary | ICD-10-CM | POA: Diagnosis not present

## 2019-04-27 DIAGNOSIS — M810 Age-related osteoporosis without current pathological fracture: Secondary | ICD-10-CM | POA: Diagnosis not present

## 2019-05-04 DIAGNOSIS — S3210XA Unspecified fracture of sacrum, initial encounter for closed fracture: Secondary | ICD-10-CM | POA: Diagnosis not present

## 2019-05-04 DIAGNOSIS — S32599A Other specified fracture of unspecified pubis, initial encounter for closed fracture: Secondary | ICD-10-CM | POA: Diagnosis not present

## 2019-05-10 DIAGNOSIS — Z79899 Other long term (current) drug therapy: Secondary | ICD-10-CM | POA: Diagnosis not present

## 2019-05-10 DIAGNOSIS — G894 Chronic pain syndrome: Secondary | ICD-10-CM | POA: Diagnosis not present

## 2019-05-10 DIAGNOSIS — Z720 Tobacco use: Secondary | ICD-10-CM | POA: Diagnosis not present

## 2019-05-10 DIAGNOSIS — G8929 Other chronic pain: Secondary | ICD-10-CM | POA: Diagnosis not present

## 2019-05-10 DIAGNOSIS — G8918 Other acute postprocedural pain: Secondary | ICD-10-CM | POA: Diagnosis not present

## 2019-05-10 DIAGNOSIS — M545 Low back pain: Secondary | ICD-10-CM | POA: Diagnosis not present

## 2019-05-25 ENCOUNTER — Other Ambulatory Visit: Payer: Self-pay

## 2019-05-25 MED ORDER — ALBUTEROL SULFATE HFA 108 (90 BASE) MCG/ACT IN AERS: 2.0000 | INHALATION_SPRAY | Freq: Four times a day (QID) | RESPIRATORY_TRACT | 0 refills | Status: DC | PRN

## 2019-06-01 DIAGNOSIS — S32599A Other specified fracture of unspecified pubis, initial encounter for closed fracture: Secondary | ICD-10-CM | POA: Diagnosis not present

## 2019-06-01 DIAGNOSIS — S3210XA Unspecified fracture of sacrum, initial encounter for closed fracture: Secondary | ICD-10-CM | POA: Diagnosis not present

## 2019-06-14 DIAGNOSIS — G8929 Other chronic pain: Secondary | ICD-10-CM | POA: Diagnosis not present

## 2019-06-14 DIAGNOSIS — M545 Low back pain: Secondary | ICD-10-CM | POA: Diagnosis not present

## 2019-06-14 DIAGNOSIS — G8918 Other acute postprocedural pain: Secondary | ICD-10-CM | POA: Diagnosis not present

## 2019-06-14 DIAGNOSIS — Z79899 Other long term (current) drug therapy: Secondary | ICD-10-CM | POA: Diagnosis not present

## 2019-06-14 DIAGNOSIS — G894 Chronic pain syndrome: Secondary | ICD-10-CM | POA: Diagnosis not present

## 2019-06-14 DIAGNOSIS — Z720 Tobacco use: Secondary | ICD-10-CM | POA: Diagnosis not present

## 2019-07-02 DIAGNOSIS — S32599A Other specified fracture of unspecified pubis, initial encounter for closed fracture: Secondary | ICD-10-CM | POA: Diagnosis not present

## 2019-07-02 DIAGNOSIS — S3210XA Unspecified fracture of sacrum, initial encounter for closed fracture: Secondary | ICD-10-CM | POA: Diagnosis not present

## 2019-07-10 DIAGNOSIS — M79675 Pain in left toe(s): Secondary | ICD-10-CM | POA: Diagnosis not present

## 2019-07-10 DIAGNOSIS — E782 Mixed hyperlipidemia: Secondary | ICD-10-CM | POA: Diagnosis not present

## 2019-07-10 DIAGNOSIS — R42 Dizziness and giddiness: Secondary | ICD-10-CM | POA: Diagnosis not present

## 2019-07-10 DIAGNOSIS — G4700 Insomnia, unspecified: Secondary | ICD-10-CM | POA: Diagnosis not present

## 2019-07-10 DIAGNOSIS — I10 Essential (primary) hypertension: Secondary | ICD-10-CM | POA: Diagnosis not present

## 2019-07-12 DIAGNOSIS — Z720 Tobacco use: Secondary | ICD-10-CM | POA: Diagnosis not present

## 2019-07-12 DIAGNOSIS — G8918 Other acute postprocedural pain: Secondary | ICD-10-CM | POA: Diagnosis not present

## 2019-07-12 DIAGNOSIS — M545 Low back pain: Secondary | ICD-10-CM | POA: Diagnosis not present

## 2019-07-12 DIAGNOSIS — Z79899 Other long term (current) drug therapy: Secondary | ICD-10-CM | POA: Diagnosis not present

## 2019-07-12 DIAGNOSIS — G8929 Other chronic pain: Secondary | ICD-10-CM | POA: Diagnosis not present

## 2019-07-12 DIAGNOSIS — G894 Chronic pain syndrome: Secondary | ICD-10-CM | POA: Diagnosis not present

## 2019-07-21 ENCOUNTER — Ambulatory Visit: Payer: PPO | Admitting: Sports Medicine

## 2019-07-21 ENCOUNTER — Other Ambulatory Visit: Payer: Self-pay | Admitting: Sports Medicine

## 2019-07-21 DIAGNOSIS — M79672 Pain in left foot: Secondary | ICD-10-CM

## 2019-07-28 ENCOUNTER — Other Ambulatory Visit: Payer: Self-pay | Admitting: Sports Medicine

## 2019-07-28 ENCOUNTER — Ambulatory Visit: Payer: Medicare Other | Admitting: Sports Medicine

## 2019-07-28 DIAGNOSIS — M79672 Pain in left foot: Secondary | ICD-10-CM

## 2019-08-01 DIAGNOSIS — S3210XA Unspecified fracture of sacrum, initial encounter for closed fracture: Secondary | ICD-10-CM | POA: Diagnosis not present

## 2019-08-01 DIAGNOSIS — S32599A Other specified fracture of unspecified pubis, initial encounter for closed fracture: Secondary | ICD-10-CM | POA: Diagnosis not present

## 2019-08-04 ENCOUNTER — Ambulatory Visit: Payer: Medicare Other | Admitting: Sports Medicine

## 2019-08-16 DIAGNOSIS — G8918 Other acute postprocedural pain: Secondary | ICD-10-CM | POA: Diagnosis not present

## 2019-08-16 DIAGNOSIS — Z79899 Other long term (current) drug therapy: Secondary | ICD-10-CM | POA: Diagnosis not present

## 2019-08-16 DIAGNOSIS — M545 Low back pain: Secondary | ICD-10-CM | POA: Diagnosis not present

## 2019-08-16 DIAGNOSIS — G8929 Other chronic pain: Secondary | ICD-10-CM | POA: Diagnosis not present

## 2019-08-16 DIAGNOSIS — G894 Chronic pain syndrome: Secondary | ICD-10-CM | POA: Diagnosis not present

## 2019-08-16 DIAGNOSIS — Z720 Tobacco use: Secondary | ICD-10-CM | POA: Diagnosis not present

## 2019-09-01 DIAGNOSIS — S32599A Other specified fracture of unspecified pubis, initial encounter for closed fracture: Secondary | ICD-10-CM | POA: Diagnosis not present

## 2019-09-01 DIAGNOSIS — S3210XA Unspecified fracture of sacrum, initial encounter for closed fracture: Secondary | ICD-10-CM | POA: Diagnosis not present

## 2019-09-13 DIAGNOSIS — G8918 Other acute postprocedural pain: Secondary | ICD-10-CM | POA: Diagnosis not present

## 2019-09-13 DIAGNOSIS — Z720 Tobacco use: Secondary | ICD-10-CM | POA: Diagnosis not present

## 2019-09-13 DIAGNOSIS — M545 Low back pain: Secondary | ICD-10-CM | POA: Diagnosis not present

## 2019-09-13 DIAGNOSIS — G894 Chronic pain syndrome: Secondary | ICD-10-CM | POA: Diagnosis not present

## 2019-09-13 DIAGNOSIS — Z79899 Other long term (current) drug therapy: Secondary | ICD-10-CM | POA: Diagnosis not present

## 2019-09-13 DIAGNOSIS — G8929 Other chronic pain: Secondary | ICD-10-CM | POA: Diagnosis not present

## 2019-09-20 ENCOUNTER — Other Ambulatory Visit: Payer: Self-pay | Admitting: Internal Medicine

## 2019-10-11 DIAGNOSIS — M545 Low back pain: Secondary | ICD-10-CM | POA: Diagnosis not present

## 2019-10-11 DIAGNOSIS — G8918 Other acute postprocedural pain: Secondary | ICD-10-CM | POA: Diagnosis not present

## 2019-10-11 DIAGNOSIS — Z720 Tobacco use: Secondary | ICD-10-CM | POA: Diagnosis not present

## 2019-10-11 DIAGNOSIS — G894 Chronic pain syndrome: Secondary | ICD-10-CM | POA: Diagnosis not present

## 2019-10-14 DIAGNOSIS — E785 Hyperlipidemia, unspecified: Secondary | ICD-10-CM | POA: Diagnosis not present

## 2019-10-14 DIAGNOSIS — M6281 Muscle weakness (generalized): Secondary | ICD-10-CM | POA: Diagnosis not present

## 2019-10-14 DIAGNOSIS — M545 Low back pain: Secondary | ICD-10-CM | POA: Diagnosis not present

## 2019-10-14 DIAGNOSIS — R110 Nausea: Secondary | ICD-10-CM | POA: Diagnosis not present

## 2019-10-14 DIAGNOSIS — Z87440 Personal history of urinary (tract) infections: Secondary | ICD-10-CM | POA: Diagnosis not present

## 2019-10-14 DIAGNOSIS — I700 Atherosclerosis of aorta: Secondary | ICD-10-CM | POA: Diagnosis not present

## 2019-10-14 DIAGNOSIS — R69 Illness, unspecified: Secondary | ICD-10-CM | POA: Diagnosis not present

## 2019-10-14 DIAGNOSIS — R1110 Vomiting, unspecified: Secondary | ICD-10-CM | POA: Diagnosis not present

## 2019-10-14 DIAGNOSIS — Z79899 Other long term (current) drug therapy: Secondary | ICD-10-CM | POA: Diagnosis not present

## 2019-10-14 DIAGNOSIS — R262 Difficulty in walking, not elsewhere classified: Secondary | ICD-10-CM | POA: Diagnosis not present

## 2019-10-14 DIAGNOSIS — G894 Chronic pain syndrome: Secondary | ICD-10-CM | POA: Diagnosis not present

## 2019-10-14 DIAGNOSIS — R2681 Unsteadiness on feet: Secondary | ICD-10-CM | POA: Diagnosis not present

## 2019-10-14 DIAGNOSIS — G8929 Other chronic pain: Secondary | ICD-10-CM | POA: Diagnosis not present

## 2019-10-14 DIAGNOSIS — K529 Noninfective gastroenteritis and colitis, unspecified: Secondary | ICD-10-CM | POA: Diagnosis not present

## 2019-10-14 DIAGNOSIS — A084 Viral intestinal infection, unspecified: Secondary | ICD-10-CM | POA: Diagnosis not present

## 2019-10-14 DIAGNOSIS — R5381 Other malaise: Secondary | ICD-10-CM | POA: Diagnosis not present

## 2019-10-14 DIAGNOSIS — I10 Essential (primary) hypertension: Secondary | ICD-10-CM | POA: Diagnosis not present

## 2019-10-14 DIAGNOSIS — Z885 Allergy status to narcotic agent status: Secondary | ICD-10-CM | POA: Diagnosis not present

## 2019-10-14 DIAGNOSIS — K2270 Barrett's esophagus without dysplasia: Secondary | ICD-10-CM | POA: Diagnosis not present

## 2019-10-14 DIAGNOSIS — Z86711 Personal history of pulmonary embolism: Secondary | ICD-10-CM | POA: Diagnosis not present

## 2019-10-14 DIAGNOSIS — J449 Chronic obstructive pulmonary disease, unspecified: Secondary | ICD-10-CM | POA: Diagnosis not present

## 2019-10-14 DIAGNOSIS — Z86010 Personal history of colonic polyps: Secondary | ICD-10-CM | POA: Diagnosis not present

## 2019-10-14 DIAGNOSIS — R112 Nausea with vomiting, unspecified: Secondary | ICD-10-CM | POA: Diagnosis not present

## 2019-10-14 DIAGNOSIS — Z79891 Long term (current) use of opiate analgesic: Secondary | ICD-10-CM | POA: Diagnosis not present

## 2019-10-14 DIAGNOSIS — R52 Pain, unspecified: Secondary | ICD-10-CM | POA: Diagnosis not present

## 2019-10-14 DIAGNOSIS — E7800 Pure hypercholesterolemia, unspecified: Secondary | ICD-10-CM | POA: Diagnosis not present

## 2019-10-14 DIAGNOSIS — Z743 Need for continuous supervision: Secondary | ICD-10-CM | POA: Diagnosis not present

## 2019-10-14 DIAGNOSIS — C3490 Malignant neoplasm of unspecified part of unspecified bronchus or lung: Secondary | ICD-10-CM | POA: Diagnosis not present

## 2019-10-14 DIAGNOSIS — Z888 Allergy status to other drugs, medicaments and biological substances status: Secondary | ICD-10-CM | POA: Diagnosis not present

## 2019-10-14 DIAGNOSIS — R109 Unspecified abdominal pain: Secondary | ICD-10-CM | POA: Diagnosis not present

## 2019-10-14 DIAGNOSIS — I672 Cerebral atherosclerosis: Secondary | ICD-10-CM | POA: Diagnosis not present

## 2019-10-14 DIAGNOSIS — I110 Hypertensive heart disease with heart failure: Secondary | ICD-10-CM | POA: Diagnosis not present

## 2019-10-14 DIAGNOSIS — M5116 Intervertebral disc disorders with radiculopathy, lumbar region: Secondary | ICD-10-CM | POA: Diagnosis not present

## 2019-10-14 DIAGNOSIS — Z95828 Presence of other vascular implants and grafts: Secondary | ICD-10-CM | POA: Diagnosis not present

## 2019-10-14 DIAGNOSIS — K5730 Diverticulosis of large intestine without perforation or abscess without bleeding: Secondary | ICD-10-CM | POA: Diagnosis not present

## 2019-10-14 DIAGNOSIS — Z85118 Personal history of other malignant neoplasm of bronchus and lung: Secondary | ICD-10-CM | POA: Diagnosis not present

## 2019-10-14 DIAGNOSIS — Z9089 Acquired absence of other organs: Secondary | ICD-10-CM | POA: Diagnosis not present

## 2019-10-14 DIAGNOSIS — R1111 Vomiting without nausea: Secondary | ICD-10-CM | POA: Diagnosis not present

## 2019-10-14 DIAGNOSIS — R1084 Generalized abdominal pain: Secondary | ICD-10-CM | POA: Diagnosis not present

## 2019-10-14 DIAGNOSIS — R278 Other lack of coordination: Secondary | ICD-10-CM | POA: Diagnosis not present

## 2019-10-14 DIAGNOSIS — K219 Gastro-esophageal reflux disease without esophagitis: Secondary | ICD-10-CM | POA: Diagnosis not present

## 2019-10-15 ENCOUNTER — Inpatient Hospital Stay
Admission: AD | Admit: 2019-10-15 | Payer: Medicare Other | Source: Other Acute Inpatient Hospital | Admitting: Internal Medicine

## 2019-10-20 DIAGNOSIS — R69 Illness, unspecified: Secondary | ICD-10-CM | POA: Diagnosis not present

## 2019-10-20 DIAGNOSIS — R262 Difficulty in walking, not elsewhere classified: Secondary | ICD-10-CM | POA: Diagnosis not present

## 2019-10-20 DIAGNOSIS — R100 Acute abdomen: Secondary | ICD-10-CM | POA: Diagnosis not present

## 2019-10-20 DIAGNOSIS — K2270 Barrett's esophagus without dysplasia: Secondary | ICD-10-CM | POA: Diagnosis not present

## 2019-10-20 DIAGNOSIS — G894 Chronic pain syndrome: Secondary | ICD-10-CM | POA: Diagnosis not present

## 2019-10-20 DIAGNOSIS — I10 Essential (primary) hypertension: Secondary | ICD-10-CM | POA: Diagnosis not present

## 2019-10-20 DIAGNOSIS — K529 Noninfective gastroenteritis and colitis, unspecified: Secondary | ICD-10-CM | POA: Diagnosis not present

## 2019-10-20 DIAGNOSIS — R1084 Generalized abdominal pain: Secondary | ICD-10-CM | POA: Diagnosis not present

## 2019-10-20 DIAGNOSIS — E7800 Pure hypercholesterolemia, unspecified: Secondary | ICD-10-CM | POA: Diagnosis not present

## 2019-10-20 DIAGNOSIS — Z743 Need for continuous supervision: Secondary | ICD-10-CM | POA: Diagnosis not present

## 2019-10-20 DIAGNOSIS — I739 Peripheral vascular disease, unspecified: Secondary | ICD-10-CM | POA: Diagnosis not present

## 2019-10-20 DIAGNOSIS — R5381 Other malaise: Secondary | ICD-10-CM | POA: Diagnosis not present

## 2019-10-20 DIAGNOSIS — K219 Gastro-esophageal reflux disease without esophagitis: Secondary | ICD-10-CM | POA: Diagnosis not present

## 2019-10-20 DIAGNOSIS — Z85118 Personal history of other malignant neoplasm of bronchus and lung: Secondary | ICD-10-CM | POA: Diagnosis not present

## 2019-10-20 DIAGNOSIS — J449 Chronic obstructive pulmonary disease, unspecified: Secondary | ICD-10-CM | POA: Diagnosis not present

## 2019-10-20 DIAGNOSIS — A084 Viral intestinal infection, unspecified: Secondary | ICD-10-CM | POA: Diagnosis not present

## 2019-10-20 DIAGNOSIS — R2681 Unsteadiness on feet: Secondary | ICD-10-CM | POA: Diagnosis not present

## 2019-10-20 DIAGNOSIS — R112 Nausea with vomiting, unspecified: Secondary | ICD-10-CM | POA: Diagnosis not present

## 2019-10-20 DIAGNOSIS — Z86711 Personal history of pulmonary embolism: Secondary | ICD-10-CM | POA: Diagnosis not present

## 2019-10-20 DIAGNOSIS — I672 Cerebral atherosclerosis: Secondary | ICD-10-CM | POA: Diagnosis not present

## 2019-10-20 DIAGNOSIS — K5903 Drug induced constipation: Secondary | ICD-10-CM | POA: Diagnosis not present

## 2019-10-20 DIAGNOSIS — M6281 Muscle weakness (generalized): Secondary | ICD-10-CM | POA: Diagnosis not present

## 2019-10-20 DIAGNOSIS — G8929 Other chronic pain: Secondary | ICD-10-CM | POA: Diagnosis not present

## 2019-10-20 DIAGNOSIS — M5116 Intervertebral disc disorders with radiculopathy, lumbar region: Secondary | ICD-10-CM | POA: Diagnosis not present

## 2019-10-20 DIAGNOSIS — M810 Age-related osteoporosis without current pathological fracture: Secondary | ICD-10-CM | POA: Diagnosis not present

## 2019-10-20 DIAGNOSIS — E785 Hyperlipidemia, unspecified: Secondary | ICD-10-CM | POA: Diagnosis not present

## 2019-10-20 DIAGNOSIS — M545 Low back pain: Secondary | ICD-10-CM | POA: Diagnosis not present

## 2019-10-20 DIAGNOSIS — Z86010 Personal history of colonic polyps: Secondary | ICD-10-CM | POA: Diagnosis not present

## 2019-10-20 DIAGNOSIS — Z95828 Presence of other vascular implants and grafts: Secondary | ICD-10-CM | POA: Diagnosis not present

## 2019-10-20 DIAGNOSIS — R278 Other lack of coordination: Secondary | ICD-10-CM | POA: Diagnosis not present

## 2019-10-20 DIAGNOSIS — R109 Unspecified abdominal pain: Secondary | ICD-10-CM | POA: Diagnosis not present

## 2019-10-20 DIAGNOSIS — I110 Hypertensive heart disease with heart failure: Secondary | ICD-10-CM | POA: Diagnosis not present

## 2019-10-23 DIAGNOSIS — R100 Acute abdomen: Secondary | ICD-10-CM | POA: Diagnosis not present

## 2019-10-23 DIAGNOSIS — K219 Gastro-esophageal reflux disease without esophagitis: Secondary | ICD-10-CM | POA: Diagnosis not present

## 2019-10-23 DIAGNOSIS — I10 Essential (primary) hypertension: Secondary | ICD-10-CM | POA: Diagnosis not present

## 2019-10-23 DIAGNOSIS — G894 Chronic pain syndrome: Secondary | ICD-10-CM | POA: Diagnosis not present

## 2019-10-27 DIAGNOSIS — I10 Essential (primary) hypertension: Secondary | ICD-10-CM | POA: Diagnosis not present

## 2019-10-27 DIAGNOSIS — M810 Age-related osteoporosis without current pathological fracture: Secondary | ICD-10-CM | POA: Diagnosis not present

## 2019-10-27 DIAGNOSIS — I739 Peripheral vascular disease, unspecified: Secondary | ICD-10-CM | POA: Diagnosis not present

## 2019-10-27 DIAGNOSIS — M6281 Muscle weakness (generalized): Secondary | ICD-10-CM | POA: Diagnosis not present

## 2019-11-01 DIAGNOSIS — I10 Essential (primary) hypertension: Secondary | ICD-10-CM | POA: Diagnosis not present

## 2019-11-01 DIAGNOSIS — K219 Gastro-esophageal reflux disease without esophagitis: Secondary | ICD-10-CM | POA: Diagnosis not present

## 2019-11-01 DIAGNOSIS — G894 Chronic pain syndrome: Secondary | ICD-10-CM | POA: Diagnosis not present

## 2019-11-02 DIAGNOSIS — K219 Gastro-esophageal reflux disease without esophagitis: Secondary | ICD-10-CM | POA: Diagnosis not present

## 2019-11-02 DIAGNOSIS — R1084 Generalized abdominal pain: Secondary | ICD-10-CM | POA: Diagnosis not present

## 2019-11-02 DIAGNOSIS — K5903 Drug induced constipation: Secondary | ICD-10-CM | POA: Diagnosis not present

## 2019-11-02 DIAGNOSIS — K2270 Barrett's esophagus without dysplasia: Secondary | ICD-10-CM | POA: Diagnosis not present

## 2019-11-03 DIAGNOSIS — G894 Chronic pain syndrome: Secondary | ICD-10-CM | POA: Diagnosis not present

## 2019-11-03 DIAGNOSIS — K219 Gastro-esophageal reflux disease without esophagitis: Secondary | ICD-10-CM | POA: Diagnosis not present

## 2019-11-03 DIAGNOSIS — R100 Acute abdomen: Secondary | ICD-10-CM | POA: Diagnosis not present

## 2019-11-03 DIAGNOSIS — R262 Difficulty in walking, not elsewhere classified: Secondary | ICD-10-CM | POA: Diagnosis not present

## 2019-11-03 DIAGNOSIS — E7800 Pure hypercholesterolemia, unspecified: Secondary | ICD-10-CM | POA: Diagnosis not present

## 2019-11-07 DIAGNOSIS — E7800 Pure hypercholesterolemia, unspecified: Secondary | ICD-10-CM | POA: Diagnosis not present

## 2019-11-07 DIAGNOSIS — I4891 Unspecified atrial fibrillation: Secondary | ICD-10-CM | POA: Diagnosis not present

## 2019-11-07 DIAGNOSIS — K529 Noninfective gastroenteritis and colitis, unspecified: Secondary | ICD-10-CM | POA: Diagnosis not present

## 2019-11-07 DIAGNOSIS — E785 Hyperlipidemia, unspecified: Secondary | ICD-10-CM | POA: Diagnosis not present

## 2019-11-07 DIAGNOSIS — K219 Gastro-esophageal reflux disease without esophagitis: Secondary | ICD-10-CM | POA: Diagnosis not present

## 2019-11-07 DIAGNOSIS — J449 Chronic obstructive pulmonary disease, unspecified: Secondary | ICD-10-CM | POA: Diagnosis not present

## 2019-11-07 DIAGNOSIS — D649 Anemia, unspecified: Secondary | ICD-10-CM | POA: Diagnosis not present

## 2019-11-07 DIAGNOSIS — M5116 Intervertebral disc disorders with radiculopathy, lumbar region: Secondary | ICD-10-CM | POA: Diagnosis not present

## 2019-11-07 DIAGNOSIS — Z79899 Other long term (current) drug therapy: Secondary | ICD-10-CM | POA: Diagnosis not present

## 2019-11-07 DIAGNOSIS — I739 Peripheral vascular disease, unspecified: Secondary | ICD-10-CM | POA: Diagnosis not present

## 2019-11-07 DIAGNOSIS — I10 Essential (primary) hypertension: Secondary | ICD-10-CM | POA: Diagnosis not present

## 2019-11-07 DIAGNOSIS — M545 Low back pain: Secondary | ICD-10-CM | POA: Diagnosis not present

## 2019-11-10 DIAGNOSIS — J449 Chronic obstructive pulmonary disease, unspecified: Secondary | ICD-10-CM | POA: Diagnosis not present

## 2019-11-10 DIAGNOSIS — M5116 Intervertebral disc disorders with radiculopathy, lumbar region: Secondary | ICD-10-CM | POA: Diagnosis not present

## 2019-11-10 DIAGNOSIS — E785 Hyperlipidemia, unspecified: Secondary | ICD-10-CM | POA: Diagnosis not present

## 2019-11-10 DIAGNOSIS — M545 Low back pain: Secondary | ICD-10-CM | POA: Diagnosis not present

## 2019-11-10 DIAGNOSIS — K219 Gastro-esophageal reflux disease without esophagitis: Secondary | ICD-10-CM | POA: Diagnosis not present

## 2019-11-10 DIAGNOSIS — I739 Peripheral vascular disease, unspecified: Secondary | ICD-10-CM | POA: Diagnosis not present

## 2019-11-10 DIAGNOSIS — K529 Noninfective gastroenteritis and colitis, unspecified: Secondary | ICD-10-CM | POA: Diagnosis not present

## 2019-11-10 DIAGNOSIS — I4891 Unspecified atrial fibrillation: Secondary | ICD-10-CM | POA: Diagnosis not present

## 2019-11-10 DIAGNOSIS — I10 Essential (primary) hypertension: Secondary | ICD-10-CM | POA: Diagnosis not present

## 2019-11-10 DIAGNOSIS — E7800 Pure hypercholesterolemia, unspecified: Secondary | ICD-10-CM | POA: Diagnosis not present

## 2019-11-10 DIAGNOSIS — D649 Anemia, unspecified: Secondary | ICD-10-CM | POA: Diagnosis not present

## 2019-11-15 DIAGNOSIS — G8929 Other chronic pain: Secondary | ICD-10-CM | POA: Diagnosis not present

## 2019-11-15 DIAGNOSIS — R109 Unspecified abdominal pain: Secondary | ICD-10-CM | POA: Diagnosis not present

## 2019-11-15 DIAGNOSIS — G894 Chronic pain syndrome: Secondary | ICD-10-CM | POA: Diagnosis not present

## 2019-11-15 DIAGNOSIS — Z720 Tobacco use: Secondary | ICD-10-CM | POA: Diagnosis not present

## 2019-11-15 DIAGNOSIS — M545 Low back pain: Secondary | ICD-10-CM | POA: Diagnosis not present

## 2019-11-15 DIAGNOSIS — J449 Chronic obstructive pulmonary disease, unspecified: Secondary | ICD-10-CM | POA: Diagnosis not present

## 2019-11-15 DIAGNOSIS — G8918 Other acute postprocedural pain: Secondary | ICD-10-CM | POA: Diagnosis not present

## 2019-11-17 DIAGNOSIS — E785 Hyperlipidemia, unspecified: Secondary | ICD-10-CM | POA: Diagnosis not present

## 2019-11-17 DIAGNOSIS — J449 Chronic obstructive pulmonary disease, unspecified: Secondary | ICD-10-CM | POA: Diagnosis not present

## 2019-11-17 DIAGNOSIS — K219 Gastro-esophageal reflux disease without esophagitis: Secondary | ICD-10-CM | POA: Diagnosis not present

## 2019-11-17 DIAGNOSIS — M545 Low back pain: Secondary | ICD-10-CM | POA: Diagnosis not present

## 2019-11-17 DIAGNOSIS — K529 Noninfective gastroenteritis and colitis, unspecified: Secondary | ICD-10-CM | POA: Diagnosis not present

## 2019-11-17 DIAGNOSIS — I10 Essential (primary) hypertension: Secondary | ICD-10-CM | POA: Diagnosis not present

## 2019-11-17 DIAGNOSIS — D649 Anemia, unspecified: Secondary | ICD-10-CM | POA: Diagnosis not present

## 2019-11-17 DIAGNOSIS — E7800 Pure hypercholesterolemia, unspecified: Secondary | ICD-10-CM | POA: Diagnosis not present

## 2019-11-17 DIAGNOSIS — I739 Peripheral vascular disease, unspecified: Secondary | ICD-10-CM | POA: Diagnosis not present

## 2019-11-17 DIAGNOSIS — M5116 Intervertebral disc disorders with radiculopathy, lumbar region: Secondary | ICD-10-CM | POA: Diagnosis not present

## 2019-11-17 DIAGNOSIS — I4891 Unspecified atrial fibrillation: Secondary | ICD-10-CM | POA: Diagnosis not present

## 2019-11-21 DIAGNOSIS — I4891 Unspecified atrial fibrillation: Secondary | ICD-10-CM | POA: Diagnosis not present

## 2019-11-21 DIAGNOSIS — D649 Anemia, unspecified: Secondary | ICD-10-CM | POA: Diagnosis not present

## 2019-11-21 DIAGNOSIS — K219 Gastro-esophageal reflux disease without esophagitis: Secondary | ICD-10-CM | POA: Diagnosis not present

## 2019-11-21 DIAGNOSIS — J449 Chronic obstructive pulmonary disease, unspecified: Secondary | ICD-10-CM | POA: Diagnosis not present

## 2019-11-21 DIAGNOSIS — E785 Hyperlipidemia, unspecified: Secondary | ICD-10-CM | POA: Diagnosis not present

## 2019-11-21 DIAGNOSIS — E7800 Pure hypercholesterolemia, unspecified: Secondary | ICD-10-CM | POA: Diagnosis not present

## 2019-11-21 DIAGNOSIS — K529 Noninfective gastroenteritis and colitis, unspecified: Secondary | ICD-10-CM | POA: Diagnosis not present

## 2019-11-21 DIAGNOSIS — I739 Peripheral vascular disease, unspecified: Secondary | ICD-10-CM | POA: Diagnosis not present

## 2019-11-21 DIAGNOSIS — M5116 Intervertebral disc disorders with radiculopathy, lumbar region: Secondary | ICD-10-CM | POA: Diagnosis not present

## 2019-11-21 DIAGNOSIS — M545 Low back pain: Secondary | ICD-10-CM | POA: Diagnosis not present

## 2019-11-21 DIAGNOSIS — I10 Essential (primary) hypertension: Secondary | ICD-10-CM | POA: Diagnosis not present

## 2019-11-23 DIAGNOSIS — I10 Essential (primary) hypertension: Secondary | ICD-10-CM | POA: Diagnosis not present

## 2019-11-23 DIAGNOSIS — E7800 Pure hypercholesterolemia, unspecified: Secondary | ICD-10-CM | POA: Diagnosis not present

## 2019-11-23 DIAGNOSIS — I4891 Unspecified atrial fibrillation: Secondary | ICD-10-CM | POA: Diagnosis not present

## 2019-11-23 DIAGNOSIS — E785 Hyperlipidemia, unspecified: Secondary | ICD-10-CM | POA: Diagnosis not present

## 2019-11-23 DIAGNOSIS — K219 Gastro-esophageal reflux disease without esophagitis: Secondary | ICD-10-CM | POA: Diagnosis not present

## 2019-11-23 DIAGNOSIS — M545 Low back pain: Secondary | ICD-10-CM | POA: Diagnosis not present

## 2019-11-23 DIAGNOSIS — I739 Peripheral vascular disease, unspecified: Secondary | ICD-10-CM | POA: Diagnosis not present

## 2019-11-23 DIAGNOSIS — J449 Chronic obstructive pulmonary disease, unspecified: Secondary | ICD-10-CM | POA: Diagnosis not present

## 2019-11-23 DIAGNOSIS — D649 Anemia, unspecified: Secondary | ICD-10-CM | POA: Diagnosis not present

## 2019-11-23 DIAGNOSIS — M5116 Intervertebral disc disorders with radiculopathy, lumbar region: Secondary | ICD-10-CM | POA: Diagnosis not present

## 2019-11-23 DIAGNOSIS — K529 Noninfective gastroenteritis and colitis, unspecified: Secondary | ICD-10-CM | POA: Diagnosis not present

## 2019-11-29 DIAGNOSIS — K2970 Gastritis, unspecified, without bleeding: Secondary | ICD-10-CM | POA: Diagnosis not present

## 2019-11-29 DIAGNOSIS — R1084 Generalized abdominal pain: Secondary | ICD-10-CM | POA: Diagnosis not present

## 2019-11-29 DIAGNOSIS — K2270 Barrett's esophagus without dysplasia: Secondary | ICD-10-CM | POA: Diagnosis not present

## 2019-12-14 DIAGNOSIS — G894 Chronic pain syndrome: Secondary | ICD-10-CM | POA: Diagnosis not present

## 2019-12-14 DIAGNOSIS — Z79891 Long term (current) use of opiate analgesic: Secondary | ICD-10-CM | POA: Diagnosis not present

## 2019-12-27 DIAGNOSIS — M545 Low back pain, unspecified: Secondary | ICD-10-CM | POA: Diagnosis not present

## 2019-12-27 DIAGNOSIS — G8918 Other acute postprocedural pain: Secondary | ICD-10-CM | POA: Diagnosis not present

## 2019-12-27 DIAGNOSIS — M5440 Lumbago with sciatica, unspecified side: Secondary | ICD-10-CM | POA: Diagnosis not present

## 2019-12-27 DIAGNOSIS — G894 Chronic pain syndrome: Secondary | ICD-10-CM | POA: Diagnosis not present

## 2019-12-27 DIAGNOSIS — G8929 Other chronic pain: Secondary | ICD-10-CM | POA: Diagnosis not present

## 2020-01-18 DIAGNOSIS — Z79891 Long term (current) use of opiate analgesic: Secondary | ICD-10-CM | POA: Diagnosis not present

## 2020-01-18 DIAGNOSIS — G894 Chronic pain syndrome: Secondary | ICD-10-CM | POA: Diagnosis not present

## 2020-01-24 DIAGNOSIS — Z23 Encounter for immunization: Secondary | ICD-10-CM | POA: Diagnosis not present

## 2020-01-24 DIAGNOSIS — E782 Mixed hyperlipidemia: Secondary | ICD-10-CM | POA: Diagnosis not present

## 2020-01-24 DIAGNOSIS — Z720 Tobacco use: Secondary | ICD-10-CM | POA: Diagnosis not present

## 2020-01-24 DIAGNOSIS — I10 Essential (primary) hypertension: Secondary | ICD-10-CM | POA: Diagnosis not present

## 2020-01-24 DIAGNOSIS — J449 Chronic obstructive pulmonary disease, unspecified: Secondary | ICD-10-CM | POA: Diagnosis not present

## 2020-01-31 DIAGNOSIS — G894 Chronic pain syndrome: Secondary | ICD-10-CM | POA: Diagnosis not present

## 2020-01-31 DIAGNOSIS — G8918 Other acute postprocedural pain: Secondary | ICD-10-CM | POA: Diagnosis not present

## 2020-01-31 DIAGNOSIS — M5440 Lumbago with sciatica, unspecified side: Secondary | ICD-10-CM | POA: Diagnosis not present

## 2020-01-31 DIAGNOSIS — M545 Low back pain, unspecified: Secondary | ICD-10-CM | POA: Diagnosis not present

## 2020-03-25 DIAGNOSIS — J449 Chronic obstructive pulmonary disease, unspecified: Secondary | ICD-10-CM | POA: Diagnosis not present

## 2020-03-25 DIAGNOSIS — R110 Nausea: Secondary | ICD-10-CM | POA: Diagnosis not present

## 2020-03-25 DIAGNOSIS — G8929 Other chronic pain: Secondary | ICD-10-CM | POA: Diagnosis not present

## 2020-03-25 DIAGNOSIS — R109 Unspecified abdominal pain: Secondary | ICD-10-CM | POA: Diagnosis not present

## 2020-03-26 DIAGNOSIS — Z79891 Long term (current) use of opiate analgesic: Secondary | ICD-10-CM | POA: Diagnosis not present

## 2020-03-26 DIAGNOSIS — G894 Chronic pain syndrome: Secondary | ICD-10-CM | POA: Diagnosis not present

## 2020-04-03 DIAGNOSIS — Z720 Tobacco use: Secondary | ICD-10-CM | POA: Diagnosis not present

## 2020-04-03 DIAGNOSIS — G894 Chronic pain syndrome: Secondary | ICD-10-CM | POA: Diagnosis not present

## 2020-04-03 DIAGNOSIS — M542 Cervicalgia: Secondary | ICD-10-CM | POA: Diagnosis not present

## 2020-04-03 DIAGNOSIS — M545 Low back pain, unspecified: Secondary | ICD-10-CM | POA: Diagnosis not present

## 2020-04-03 DIAGNOSIS — G8918 Other acute postprocedural pain: Secondary | ICD-10-CM | POA: Diagnosis not present

## 2020-04-16 DIAGNOSIS — G894 Chronic pain syndrome: Secondary | ICD-10-CM | POA: Diagnosis not present

## 2020-04-16 DIAGNOSIS — Z79891 Long term (current) use of opiate analgesic: Secondary | ICD-10-CM | POA: Diagnosis not present

## 2020-05-01 DIAGNOSIS — M542 Cervicalgia: Secondary | ICD-10-CM | POA: Diagnosis not present

## 2020-05-01 DIAGNOSIS — Z720 Tobacco use: Secondary | ICD-10-CM | POA: Diagnosis not present

## 2020-05-01 DIAGNOSIS — M545 Low back pain, unspecified: Secondary | ICD-10-CM | POA: Diagnosis not present

## 2020-05-01 DIAGNOSIS — G894 Chronic pain syndrome: Secondary | ICD-10-CM | POA: Diagnosis not present

## 2020-05-01 DIAGNOSIS — G8918 Other acute postprocedural pain: Secondary | ICD-10-CM | POA: Diagnosis not present

## 2020-05-22 DIAGNOSIS — G8918 Other acute postprocedural pain: Secondary | ICD-10-CM | POA: Diagnosis not present

## 2020-05-22 DIAGNOSIS — M545 Low back pain, unspecified: Secondary | ICD-10-CM | POA: Diagnosis not present

## 2020-05-22 DIAGNOSIS — M542 Cervicalgia: Secondary | ICD-10-CM | POA: Diagnosis not present

## 2020-05-22 DIAGNOSIS — G894 Chronic pain syndrome: Secondary | ICD-10-CM | POA: Diagnosis not present

## 2020-05-22 DIAGNOSIS — Z720 Tobacco use: Secondary | ICD-10-CM | POA: Diagnosis not present

## 2020-06-24 DIAGNOSIS — H6980 Other specified disorders of Eustachian tube, unspecified ear: Secondary | ICD-10-CM | POA: Diagnosis not present

## 2020-06-24 DIAGNOSIS — R42 Dizziness and giddiness: Secondary | ICD-10-CM | POA: Diagnosis not present

## 2020-07-10 DIAGNOSIS — Z95828 Presence of other vascular implants and grafts: Secondary | ICD-10-CM | POA: Diagnosis not present

## 2020-07-10 DIAGNOSIS — Z7401 Bed confinement status: Secondary | ICD-10-CM | POA: Diagnosis not present

## 2020-07-10 DIAGNOSIS — G8929 Other chronic pain: Secondary | ICD-10-CM | POA: Diagnosis not present

## 2020-07-10 DIAGNOSIS — R188 Other ascites: Secondary | ICD-10-CM | POA: Diagnosis not present

## 2020-07-10 DIAGNOSIS — Z7901 Long term (current) use of anticoagulants: Secondary | ICD-10-CM | POA: Diagnosis not present

## 2020-07-10 DIAGNOSIS — Z85118 Personal history of other malignant neoplasm of bronchus and lung: Secondary | ICD-10-CM | POA: Diagnosis not present

## 2020-07-10 DIAGNOSIS — Z86711 Personal history of pulmonary embolism: Secondary | ICD-10-CM | POA: Diagnosis not present

## 2020-07-10 DIAGNOSIS — Z8673 Personal history of transient ischemic attack (TIA), and cerebral infarction without residual deficits: Secondary | ICD-10-CM | POA: Diagnosis not present

## 2020-07-10 DIAGNOSIS — M5116 Intervertebral disc disorders with radiculopathy, lumbar region: Secondary | ICD-10-CM | POA: Diagnosis not present

## 2020-07-10 DIAGNOSIS — J9601 Acute respiratory failure with hypoxia: Secondary | ICD-10-CM | POA: Diagnosis not present

## 2020-07-10 DIAGNOSIS — M2550 Pain in unspecified joint: Secondary | ICD-10-CM | POA: Diagnosis not present

## 2020-07-10 DIAGNOSIS — R0600 Dyspnea, unspecified: Secondary | ICD-10-CM | POA: Diagnosis not present

## 2020-07-10 DIAGNOSIS — F17210 Nicotine dependence, cigarettes, uncomplicated: Secondary | ICD-10-CM | POA: Diagnosis not present

## 2020-07-10 DIAGNOSIS — M6281 Muscle weakness (generalized): Secondary | ICD-10-CM | POA: Diagnosis not present

## 2020-07-10 DIAGNOSIS — R059 Cough, unspecified: Secondary | ICD-10-CM | POA: Diagnosis not present

## 2020-07-10 DIAGNOSIS — Z20822 Contact with and (suspected) exposure to covid-19: Secondary | ICD-10-CM | POA: Diagnosis not present

## 2020-07-10 DIAGNOSIS — J9690 Respiratory failure, unspecified, unspecified whether with hypoxia or hypercapnia: Secondary | ICD-10-CM | POA: Diagnosis not present

## 2020-07-10 DIAGNOSIS — I340 Nonrheumatic mitral (valve) insufficiency: Secondary | ICD-10-CM | POA: Diagnosis not present

## 2020-07-10 DIAGNOSIS — Z79899 Other long term (current) drug therapy: Secondary | ICD-10-CM | POA: Diagnosis not present

## 2020-07-10 DIAGNOSIS — J90 Pleural effusion, not elsewhere classified: Secondary | ICD-10-CM | POA: Diagnosis not present

## 2020-07-10 DIAGNOSIS — R0689 Other abnormalities of breathing: Secondary | ICD-10-CM | POA: Diagnosis not present

## 2020-07-10 DIAGNOSIS — R278 Other lack of coordination: Secondary | ICD-10-CM | POA: Diagnosis not present

## 2020-07-10 DIAGNOSIS — I499 Cardiac arrhythmia, unspecified: Secondary | ICD-10-CM | POA: Diagnosis not present

## 2020-07-10 DIAGNOSIS — Z9981 Dependence on supplemental oxygen: Secondary | ICD-10-CM | POA: Diagnosis not present

## 2020-07-10 DIAGNOSIS — J9811 Atelectasis: Secondary | ICD-10-CM | POA: Diagnosis not present

## 2020-07-10 DIAGNOSIS — J439 Emphysema, unspecified: Secondary | ICD-10-CM | POA: Diagnosis not present

## 2020-07-10 DIAGNOSIS — I4891 Unspecified atrial fibrillation: Secondary | ICD-10-CM | POA: Diagnosis not present

## 2020-07-10 DIAGNOSIS — R0602 Shortness of breath: Secondary | ICD-10-CM | POA: Diagnosis not present

## 2020-07-10 DIAGNOSIS — Z881 Allergy status to other antibiotic agents status: Secondary | ICD-10-CM | POA: Diagnosis not present

## 2020-07-10 DIAGNOSIS — Z87440 Personal history of urinary (tract) infections: Secondary | ICD-10-CM | POA: Diagnosis not present

## 2020-07-10 DIAGNOSIS — J811 Chronic pulmonary edema: Secondary | ICD-10-CM | POA: Diagnosis not present

## 2020-07-10 DIAGNOSIS — E222 Syndrome of inappropriate secretion of antidiuretic hormone: Secondary | ICD-10-CM | POA: Diagnosis not present

## 2020-07-10 DIAGNOSIS — I361 Nonrheumatic tricuspid (valve) insufficiency: Secondary | ICD-10-CM | POA: Diagnosis not present

## 2020-07-10 DIAGNOSIS — R531 Weakness: Secondary | ICD-10-CM | POA: Diagnosis not present

## 2020-07-10 DIAGNOSIS — Z902 Acquired absence of lung [part of]: Secondary | ICD-10-CM | POA: Diagnosis not present

## 2020-07-10 DIAGNOSIS — Z736 Limitation of activities due to disability: Secondary | ICD-10-CM | POA: Diagnosis not present

## 2020-07-10 DIAGNOSIS — Z743 Need for continuous supervision: Secondary | ICD-10-CM | POA: Diagnosis not present

## 2020-07-10 DIAGNOSIS — G894 Chronic pain syndrome: Secondary | ICD-10-CM | POA: Diagnosis not present

## 2020-07-10 DIAGNOSIS — A419 Sepsis, unspecified organism: Secondary | ICD-10-CM | POA: Diagnosis not present

## 2020-07-10 DIAGNOSIS — I10 Essential (primary) hypertension: Secondary | ICD-10-CM | POA: Diagnosis not present

## 2020-07-10 DIAGNOSIS — J441 Chronic obstructive pulmonary disease with (acute) exacerbation: Secondary | ICD-10-CM | POA: Diagnosis not present

## 2020-07-10 DIAGNOSIS — J449 Chronic obstructive pulmonary disease, unspecified: Secondary | ICD-10-CM | POA: Diagnosis not present

## 2020-07-10 DIAGNOSIS — E871 Hypo-osmolality and hyponatremia: Secondary | ICD-10-CM | POA: Diagnosis not present

## 2020-07-10 DIAGNOSIS — E876 Hypokalemia: Secondary | ICD-10-CM | POA: Diagnosis not present

## 2020-07-10 DIAGNOSIS — M1990 Unspecified osteoarthritis, unspecified site: Secondary | ICD-10-CM | POA: Diagnosis not present

## 2020-07-18 DIAGNOSIS — I340 Nonrheumatic mitral (valve) insufficiency: Secondary | ICD-10-CM | POA: Diagnosis not present

## 2020-07-18 DIAGNOSIS — I361 Nonrheumatic tricuspid (valve) insufficiency: Secondary | ICD-10-CM | POA: Diagnosis not present

## 2020-07-20 DIAGNOSIS — E785 Hyperlipidemia, unspecified: Secondary | ICD-10-CM | POA: Diagnosis not present

## 2020-07-20 DIAGNOSIS — Z86711 Personal history of pulmonary embolism: Secondary | ICD-10-CM | POA: Diagnosis not present

## 2020-07-20 DIAGNOSIS — I4891 Unspecified atrial fibrillation: Secondary | ICD-10-CM | POA: Diagnosis not present

## 2020-07-20 DIAGNOSIS — A419 Sepsis, unspecified organism: Secondary | ICD-10-CM | POA: Diagnosis not present

## 2020-07-20 DIAGNOSIS — N179 Acute kidney failure, unspecified: Secondary | ICD-10-CM | POA: Diagnosis not present

## 2020-07-20 DIAGNOSIS — M5116 Intervertebral disc disorders with radiculopathy, lumbar region: Secondary | ICD-10-CM | POA: Diagnosis not present

## 2020-07-20 DIAGNOSIS — Z85118 Personal history of other malignant neoplasm of bronchus and lung: Secondary | ICD-10-CM | POA: Diagnosis not present

## 2020-07-20 DIAGNOSIS — M2550 Pain in unspecified joint: Secondary | ICD-10-CM | POA: Diagnosis not present

## 2020-07-20 DIAGNOSIS — Z7401 Bed confinement status: Secondary | ICD-10-CM | POA: Diagnosis not present

## 2020-07-20 DIAGNOSIS — Z8673 Personal history of transient ischemic attack (TIA), and cerebral infarction without residual deficits: Secondary | ICD-10-CM | POA: Diagnosis not present

## 2020-07-20 DIAGNOSIS — Z9981 Dependence on supplemental oxygen: Secondary | ICD-10-CM | POA: Diagnosis not present

## 2020-07-20 DIAGNOSIS — Z79899 Other long term (current) drug therapy: Secondary | ICD-10-CM | POA: Diagnosis not present

## 2020-07-20 DIAGNOSIS — Z902 Acquired absence of lung [part of]: Secondary | ICD-10-CM | POA: Diagnosis not present

## 2020-07-20 DIAGNOSIS — J440 Chronic obstructive pulmonary disease with acute lower respiratory infection: Secondary | ICD-10-CM | POA: Diagnosis not present

## 2020-07-20 DIAGNOSIS — Z743 Need for continuous supervision: Secondary | ICD-10-CM | POA: Diagnosis not present

## 2020-07-20 DIAGNOSIS — R278 Other lack of coordination: Secondary | ICD-10-CM | POA: Diagnosis not present

## 2020-07-20 DIAGNOSIS — I5033 Acute on chronic diastolic (congestive) heart failure: Secondary | ICD-10-CM | POA: Diagnosis not present

## 2020-07-20 DIAGNOSIS — M1990 Unspecified osteoarthritis, unspecified site: Secondary | ICD-10-CM | POA: Diagnosis not present

## 2020-07-20 DIAGNOSIS — D649 Anemia, unspecified: Secondary | ICD-10-CM | POA: Diagnosis not present

## 2020-07-20 DIAGNOSIS — F17210 Nicotine dependence, cigarettes, uncomplicated: Secondary | ICD-10-CM | POA: Diagnosis not present

## 2020-07-20 DIAGNOSIS — J9612 Chronic respiratory failure with hypercapnia: Secondary | ICD-10-CM | POA: Diagnosis not present

## 2020-07-20 DIAGNOSIS — E871 Hypo-osmolality and hyponatremia: Secondary | ICD-10-CM | POA: Diagnosis not present

## 2020-07-20 DIAGNOSIS — I959 Hypotension, unspecified: Secondary | ICD-10-CM | POA: Diagnosis not present

## 2020-07-20 DIAGNOSIS — Z95828 Presence of other vascular implants and grafts: Secondary | ICD-10-CM | POA: Diagnosis not present

## 2020-07-20 DIAGNOSIS — R0602 Shortness of breath: Secondary | ICD-10-CM | POA: Diagnosis not present

## 2020-07-20 DIAGNOSIS — I499 Cardiac arrhythmia, unspecified: Secondary | ICD-10-CM | POA: Diagnosis not present

## 2020-07-20 DIAGNOSIS — Z7902 Long term (current) use of antithrombotics/antiplatelets: Secondary | ICD-10-CM | POA: Diagnosis not present

## 2020-07-20 DIAGNOSIS — J811 Chronic pulmonary edema: Secondary | ICD-10-CM | POA: Diagnosis not present

## 2020-07-20 DIAGNOSIS — I110 Hypertensive heart disease with heart failure: Secondary | ICD-10-CM | POA: Diagnosis not present

## 2020-07-20 DIAGNOSIS — K219 Gastro-esophageal reflux disease without esophagitis: Secondary | ICD-10-CM | POA: Diagnosis not present

## 2020-07-20 DIAGNOSIS — J9601 Acute respiratory failure with hypoxia: Secondary | ICD-10-CM | POA: Diagnosis not present

## 2020-07-20 DIAGNOSIS — M6281 Muscle weakness (generalized): Secondary | ICD-10-CM | POA: Diagnosis not present

## 2020-07-20 DIAGNOSIS — I10 Essential (primary) hypertension: Secondary | ICD-10-CM | POA: Diagnosis not present

## 2020-07-20 DIAGNOSIS — J9621 Acute and chronic respiratory failure with hypoxia: Secondary | ICD-10-CM | POA: Diagnosis not present

## 2020-07-20 DIAGNOSIS — R5381 Other malaise: Secondary | ICD-10-CM | POA: Diagnosis not present

## 2020-07-20 DIAGNOSIS — J90 Pleural effusion, not elsewhere classified: Secondary | ICD-10-CM | POA: Diagnosis not present

## 2020-07-20 DIAGNOSIS — I5040 Unspecified combined systolic (congestive) and diastolic (congestive) heart failure: Secondary | ICD-10-CM | POA: Diagnosis not present

## 2020-07-20 DIAGNOSIS — J80 Acute respiratory distress syndrome: Secondary | ICD-10-CM | POA: Diagnosis not present

## 2020-07-20 DIAGNOSIS — Z79891 Long term (current) use of opiate analgesic: Secondary | ICD-10-CM | POA: Diagnosis not present

## 2020-07-20 DIAGNOSIS — J9611 Chronic respiratory failure with hypoxia: Secondary | ICD-10-CM | POA: Diagnosis not present

## 2020-07-20 DIAGNOSIS — J9622 Acute and chronic respiratory failure with hypercapnia: Secondary | ICD-10-CM | POA: Diagnosis not present

## 2020-07-20 DIAGNOSIS — E876 Hypokalemia: Secondary | ICD-10-CM | POA: Diagnosis not present

## 2020-07-20 DIAGNOSIS — R6889 Other general symptoms and signs: Secondary | ICD-10-CM | POA: Diagnosis not present

## 2020-07-20 DIAGNOSIS — G894 Chronic pain syndrome: Secondary | ICD-10-CM | POA: Diagnosis not present

## 2020-07-20 DIAGNOSIS — J449 Chronic obstructive pulmonary disease, unspecified: Secondary | ICD-10-CM | POA: Diagnosis not present

## 2020-07-20 DIAGNOSIS — Z736 Limitation of activities due to disability: Secondary | ICD-10-CM | POA: Diagnosis not present

## 2020-07-20 DIAGNOSIS — R0789 Other chest pain: Secondary | ICD-10-CM | POA: Diagnosis not present

## 2020-07-20 DIAGNOSIS — R079 Chest pain, unspecified: Secondary | ICD-10-CM | POA: Diagnosis not present

## 2020-07-21 DIAGNOSIS — J449 Chronic obstructive pulmonary disease, unspecified: Secondary | ICD-10-CM | POA: Diagnosis not present

## 2020-07-21 DIAGNOSIS — R0602 Shortness of breath: Secondary | ICD-10-CM | POA: Diagnosis not present

## 2020-07-21 DIAGNOSIS — F17210 Nicotine dependence, cigarettes, uncomplicated: Secondary | ICD-10-CM | POA: Diagnosis not present

## 2020-07-21 DIAGNOSIS — Z743 Need for continuous supervision: Secondary | ICD-10-CM | POA: Diagnosis not present

## 2020-07-21 DIAGNOSIS — R6889 Other general symptoms and signs: Secondary | ICD-10-CM | POA: Diagnosis not present

## 2020-07-21 DIAGNOSIS — Z79899 Other long term (current) drug therapy: Secondary | ICD-10-CM | POA: Diagnosis not present

## 2020-07-21 DIAGNOSIS — Z7902 Long term (current) use of antithrombotics/antiplatelets: Secondary | ICD-10-CM | POA: Diagnosis not present

## 2020-07-22 DIAGNOSIS — K219 Gastro-esophageal reflux disease without esophagitis: Secondary | ICD-10-CM | POA: Diagnosis not present

## 2020-07-22 DIAGNOSIS — J449 Chronic obstructive pulmonary disease, unspecified: Secondary | ICD-10-CM | POA: Diagnosis not present

## 2020-07-22 DIAGNOSIS — E785 Hyperlipidemia, unspecified: Secondary | ICD-10-CM | POA: Diagnosis not present

## 2020-07-22 DIAGNOSIS — G894 Chronic pain syndrome: Secondary | ICD-10-CM | POA: Diagnosis not present

## 2020-07-22 DIAGNOSIS — I10 Essential (primary) hypertension: Secondary | ICD-10-CM | POA: Diagnosis not present

## 2020-07-22 DIAGNOSIS — I110 Hypertensive heart disease with heart failure: Secondary | ICD-10-CM | POA: Diagnosis not present

## 2020-07-22 DIAGNOSIS — M5116 Intervertebral disc disorders with radiculopathy, lumbar region: Secondary | ICD-10-CM | POA: Diagnosis not present

## 2020-07-22 DIAGNOSIS — Z86711 Personal history of pulmonary embolism: Secondary | ICD-10-CM | POA: Diagnosis not present

## 2020-07-22 DIAGNOSIS — I4891 Unspecified atrial fibrillation: Secondary | ICD-10-CM | POA: Diagnosis not present

## 2020-07-22 DIAGNOSIS — E876 Hypokalemia: Secondary | ICD-10-CM | POA: Diagnosis not present

## 2020-07-22 DIAGNOSIS — E871 Hypo-osmolality and hyponatremia: Secondary | ICD-10-CM | POA: Diagnosis not present

## 2020-07-25 DIAGNOSIS — J449 Chronic obstructive pulmonary disease, unspecified: Secondary | ICD-10-CM | POA: Diagnosis not present

## 2020-07-25 DIAGNOSIS — I5040 Unspecified combined systolic (congestive) and diastolic (congestive) heart failure: Secondary | ICD-10-CM | POA: Diagnosis not present

## 2020-07-25 DIAGNOSIS — I110 Hypertensive heart disease with heart failure: Secondary | ICD-10-CM | POA: Diagnosis not present

## 2020-07-25 DIAGNOSIS — I4891 Unspecified atrial fibrillation: Secondary | ICD-10-CM | POA: Diagnosis not present

## 2020-07-25 DIAGNOSIS — K219 Gastro-esophageal reflux disease without esophagitis: Secondary | ICD-10-CM | POA: Diagnosis not present

## 2020-07-25 DIAGNOSIS — M5116 Intervertebral disc disorders with radiculopathy, lumbar region: Secondary | ICD-10-CM | POA: Diagnosis not present

## 2020-07-25 DIAGNOSIS — I10 Essential (primary) hypertension: Secondary | ICD-10-CM | POA: Diagnosis not present

## 2020-07-25 DIAGNOSIS — G894 Chronic pain syndrome: Secondary | ICD-10-CM | POA: Diagnosis not present

## 2020-08-02 DIAGNOSIS — R0602 Shortness of breath: Secondary | ICD-10-CM | POA: Diagnosis not present

## 2020-08-04 DIAGNOSIS — E871 Hypo-osmolality and hyponatremia: Secondary | ICD-10-CM | POA: Diagnosis not present

## 2020-08-04 DIAGNOSIS — R6889 Other general symptoms and signs: Secondary | ICD-10-CM | POA: Diagnosis not present

## 2020-08-04 DIAGNOSIS — J9690 Respiratory failure, unspecified, unspecified whether with hypoxia or hypercapnia: Secondary | ICD-10-CM | POA: Diagnosis not present

## 2020-08-04 DIAGNOSIS — R5381 Other malaise: Secondary | ICD-10-CM | POA: Diagnosis not present

## 2020-08-04 DIAGNOSIS — J80 Acute respiratory distress syndrome: Secondary | ICD-10-CM | POA: Diagnosis not present

## 2020-08-04 DIAGNOSIS — J9601 Acute respiratory failure with hypoxia: Secondary | ICD-10-CM | POA: Diagnosis not present

## 2020-08-04 DIAGNOSIS — G8929 Other chronic pain: Secondary | ICD-10-CM | POA: Diagnosis not present

## 2020-08-04 DIAGNOSIS — Z95828 Presence of other vascular implants and grafts: Secondary | ICD-10-CM | POA: Diagnosis not present

## 2020-08-04 DIAGNOSIS — J440 Chronic obstructive pulmonary disease with acute lower respiratory infection: Secondary | ICD-10-CM | POA: Diagnosis not present

## 2020-08-04 DIAGNOSIS — Z86711 Personal history of pulmonary embolism: Secondary | ICD-10-CM | POA: Diagnosis not present

## 2020-08-04 DIAGNOSIS — M549 Dorsalgia, unspecified: Secondary | ICD-10-CM | POA: Diagnosis not present

## 2020-08-04 DIAGNOSIS — M6281 Muscle weakness (generalized): Secondary | ICD-10-CM | POA: Diagnosis not present

## 2020-08-04 DIAGNOSIS — Z9981 Dependence on supplemental oxygen: Secondary | ICD-10-CM | POA: Diagnosis not present

## 2020-08-04 DIAGNOSIS — Z743 Need for continuous supervision: Secondary | ICD-10-CM | POA: Diagnosis not present

## 2020-08-04 DIAGNOSIS — F17210 Nicotine dependence, cigarettes, uncomplicated: Secondary | ICD-10-CM | POA: Diagnosis not present

## 2020-08-04 DIAGNOSIS — D649 Anemia, unspecified: Secondary | ICD-10-CM | POA: Diagnosis not present

## 2020-08-04 DIAGNOSIS — E876 Hypokalemia: Secondary | ICD-10-CM | POA: Diagnosis not present

## 2020-08-04 DIAGNOSIS — I499 Cardiac arrhythmia, unspecified: Secondary | ICD-10-CM | POA: Diagnosis not present

## 2020-08-04 DIAGNOSIS — Z85118 Personal history of other malignant neoplasm of bronchus and lung: Secondary | ICD-10-CM | POA: Diagnosis not present

## 2020-08-04 DIAGNOSIS — G894 Chronic pain syndrome: Secondary | ICD-10-CM | POA: Diagnosis not present

## 2020-08-04 DIAGNOSIS — Z7902 Long term (current) use of antithrombotics/antiplatelets: Secondary | ICD-10-CM | POA: Diagnosis not present

## 2020-08-04 DIAGNOSIS — J449 Chronic obstructive pulmonary disease, unspecified: Secondary | ICD-10-CM | POA: Diagnosis not present

## 2020-08-04 DIAGNOSIS — Z736 Limitation of activities due to disability: Secondary | ICD-10-CM | POA: Diagnosis not present

## 2020-08-04 DIAGNOSIS — Z79899 Other long term (current) drug therapy: Secondary | ICD-10-CM | POA: Diagnosis not present

## 2020-08-04 DIAGNOSIS — I4891 Unspecified atrial fibrillation: Secondary | ICD-10-CM | POA: Diagnosis not present

## 2020-08-04 DIAGNOSIS — I959 Hypotension, unspecified: Secondary | ICD-10-CM | POA: Diagnosis not present

## 2020-08-04 DIAGNOSIS — J90 Pleural effusion, not elsewhere classified: Secondary | ICD-10-CM | POA: Diagnosis not present

## 2020-08-04 DIAGNOSIS — R0602 Shortness of breath: Secondary | ICD-10-CM | POA: Diagnosis not present

## 2020-08-04 DIAGNOSIS — R079 Chest pain, unspecified: Secondary | ICD-10-CM | POA: Diagnosis not present

## 2020-08-04 DIAGNOSIS — J9621 Acute and chronic respiratory failure with hypoxia: Secondary | ICD-10-CM | POA: Diagnosis not present

## 2020-08-04 DIAGNOSIS — J9622 Acute and chronic respiratory failure with hypercapnia: Secondary | ICD-10-CM | POA: Diagnosis not present

## 2020-08-04 DIAGNOSIS — I509 Heart failure, unspecified: Secondary | ICD-10-CM | POA: Diagnosis not present

## 2020-08-04 DIAGNOSIS — Z902 Acquired absence of lung [part of]: Secondary | ICD-10-CM | POA: Diagnosis not present

## 2020-08-04 DIAGNOSIS — M1990 Unspecified osteoarthritis, unspecified site: Secondary | ICD-10-CM | POA: Diagnosis not present

## 2020-08-04 DIAGNOSIS — R262 Difficulty in walking, not elsewhere classified: Secondary | ICD-10-CM | POA: Diagnosis not present

## 2020-08-04 DIAGNOSIS — I5033 Acute on chronic diastolic (congestive) heart failure: Secondary | ICD-10-CM | POA: Diagnosis not present

## 2020-08-04 DIAGNOSIS — Z8673 Personal history of transient ischemic attack (TIA), and cerebral infarction without residual deficits: Secondary | ICD-10-CM | POA: Diagnosis not present

## 2020-08-04 DIAGNOSIS — R0789 Other chest pain: Secondary | ICD-10-CM | POA: Diagnosis not present

## 2020-08-04 DIAGNOSIS — N179 Acute kidney failure, unspecified: Secondary | ICD-10-CM | POA: Diagnosis not present

## 2020-08-04 DIAGNOSIS — I110 Hypertensive heart disease with heart failure: Secondary | ICD-10-CM | POA: Diagnosis not present

## 2020-08-04 DIAGNOSIS — J9612 Chronic respiratory failure with hypercapnia: Secondary | ICD-10-CM | POA: Diagnosis not present

## 2020-08-04 DIAGNOSIS — J9611 Chronic respiratory failure with hypoxia: Secondary | ICD-10-CM | POA: Diagnosis not present

## 2020-08-04 DIAGNOSIS — J811 Chronic pulmonary edema: Secondary | ICD-10-CM | POA: Diagnosis not present

## 2020-08-04 DIAGNOSIS — M5116 Intervertebral disc disorders with radiculopathy, lumbar region: Secondary | ICD-10-CM | POA: Diagnosis not present

## 2020-08-04 DIAGNOSIS — R278 Other lack of coordination: Secondary | ICD-10-CM | POA: Diagnosis not present

## 2020-08-04 DIAGNOSIS — M6259 Muscle wasting and atrophy, not elsewhere classified, multiple sites: Secondary | ICD-10-CM | POA: Diagnosis not present

## 2020-08-04 DIAGNOSIS — R52 Pain, unspecified: Secondary | ICD-10-CM | POA: Diagnosis not present

## 2020-08-07 DIAGNOSIS — M5116 Intervertebral disc disorders with radiculopathy, lumbar region: Secondary | ICD-10-CM | POA: Diagnosis not present

## 2020-08-07 DIAGNOSIS — M6281 Muscle weakness (generalized): Secondary | ICD-10-CM | POA: Diagnosis not present

## 2020-08-07 DIAGNOSIS — J811 Chronic pulmonary edema: Secondary | ICD-10-CM | POA: Diagnosis not present

## 2020-08-07 DIAGNOSIS — R059 Cough, unspecified: Secondary | ICD-10-CM | POA: Diagnosis not present

## 2020-08-07 DIAGNOSIS — Z85118 Personal history of other malignant neoplasm of bronchus and lung: Secondary | ICD-10-CM | POA: Diagnosis not present

## 2020-08-07 DIAGNOSIS — M6259 Muscle wasting and atrophy, not elsewhere classified, multiple sites: Secondary | ICD-10-CM | POA: Diagnosis not present

## 2020-08-07 DIAGNOSIS — G8929 Other chronic pain: Secondary | ICD-10-CM | POA: Diagnosis not present

## 2020-08-07 DIAGNOSIS — J9611 Chronic respiratory failure with hypoxia: Secondary | ICD-10-CM | POA: Diagnosis not present

## 2020-08-07 DIAGNOSIS — K219 Gastro-esophageal reflux disease without esophagitis: Secondary | ICD-10-CM | POA: Diagnosis not present

## 2020-08-07 DIAGNOSIS — R262 Difficulty in walking, not elsewhere classified: Secondary | ICD-10-CM | POA: Diagnosis not present

## 2020-08-07 DIAGNOSIS — J9690 Respiratory failure, unspecified, unspecified whether with hypoxia or hypercapnia: Secondary | ICD-10-CM | POA: Diagnosis not present

## 2020-08-07 DIAGNOSIS — I10 Essential (primary) hypertension: Secondary | ICD-10-CM | POA: Diagnosis not present

## 2020-08-07 DIAGNOSIS — Z736 Limitation of activities due to disability: Secondary | ICD-10-CM | POA: Diagnosis not present

## 2020-08-07 DIAGNOSIS — I4891 Unspecified atrial fibrillation: Secondary | ICD-10-CM | POA: Diagnosis not present

## 2020-08-07 DIAGNOSIS — M549 Dorsalgia, unspecified: Secondary | ICD-10-CM | POA: Diagnosis not present

## 2020-08-07 DIAGNOSIS — E871 Hypo-osmolality and hyponatremia: Secondary | ICD-10-CM | POA: Diagnosis not present

## 2020-08-07 DIAGNOSIS — R52 Pain, unspecified: Secondary | ICD-10-CM | POA: Diagnosis not present

## 2020-08-07 DIAGNOSIS — D649 Anemia, unspecified: Secondary | ICD-10-CM | POA: Diagnosis not present

## 2020-08-07 DIAGNOSIS — I5033 Acute on chronic diastolic (congestive) heart failure: Secondary | ICD-10-CM | POA: Diagnosis not present

## 2020-08-07 DIAGNOSIS — R278 Other lack of coordination: Secondary | ICD-10-CM | POA: Diagnosis not present

## 2020-08-07 DIAGNOSIS — J449 Chronic obstructive pulmonary disease, unspecified: Secondary | ICD-10-CM | POA: Diagnosis not present

## 2020-08-07 DIAGNOSIS — Z743 Need for continuous supervision: Secondary | ICD-10-CM | POA: Diagnosis not present

## 2020-08-07 DIAGNOSIS — R0602 Shortness of breath: Secondary | ICD-10-CM | POA: Diagnosis not present

## 2020-08-07 DIAGNOSIS — G894 Chronic pain syndrome: Secondary | ICD-10-CM | POA: Diagnosis not present

## 2020-08-07 DIAGNOSIS — J9601 Acute respiratory failure with hypoxia: Secondary | ICD-10-CM | POA: Diagnosis not present

## 2020-08-07 DIAGNOSIS — N179 Acute kidney failure, unspecified: Secondary | ICD-10-CM | POA: Diagnosis not present

## 2020-08-07 DIAGNOSIS — E876 Hypokalemia: Secondary | ICD-10-CM | POA: Diagnosis not present

## 2020-08-07 DIAGNOSIS — I509 Heart failure, unspecified: Secondary | ICD-10-CM | POA: Diagnosis not present

## 2020-08-07 DIAGNOSIS — J9612 Chronic respiratory failure with hypercapnia: Secondary | ICD-10-CM | POA: Diagnosis not present

## 2020-08-07 DIAGNOSIS — M1990 Unspecified osteoarthritis, unspecified site: Secondary | ICD-10-CM | POA: Diagnosis not present

## 2020-08-07 DIAGNOSIS — I110 Hypertensive heart disease with heart failure: Secondary | ICD-10-CM | POA: Diagnosis not present

## 2020-08-07 DIAGNOSIS — I5031 Acute diastolic (congestive) heart failure: Secondary | ICD-10-CM | POA: Diagnosis not present

## 2020-08-07 DIAGNOSIS — J90 Pleural effusion, not elsewhere classified: Secondary | ICD-10-CM | POA: Diagnosis not present

## 2020-08-08 DIAGNOSIS — I4891 Unspecified atrial fibrillation: Secondary | ICD-10-CM | POA: Diagnosis not present

## 2020-08-08 DIAGNOSIS — K219 Gastro-esophageal reflux disease without esophagitis: Secondary | ICD-10-CM | POA: Diagnosis not present

## 2020-08-08 DIAGNOSIS — I10 Essential (primary) hypertension: Secondary | ICD-10-CM | POA: Diagnosis not present

## 2020-08-08 DIAGNOSIS — J449 Chronic obstructive pulmonary disease, unspecified: Secondary | ICD-10-CM | POA: Diagnosis not present

## 2020-08-08 DIAGNOSIS — G894 Chronic pain syndrome: Secondary | ICD-10-CM | POA: Diagnosis not present

## 2020-08-08 DIAGNOSIS — I5031 Acute diastolic (congestive) heart failure: Secondary | ICD-10-CM | POA: Diagnosis not present

## 2020-08-15 DIAGNOSIS — J9601 Acute respiratory failure with hypoxia: Secondary | ICD-10-CM | POA: Diagnosis not present

## 2020-08-15 DIAGNOSIS — R059 Cough, unspecified: Secondary | ICD-10-CM | POA: Diagnosis not present

## 2020-08-15 DIAGNOSIS — J811 Chronic pulmonary edema: Secondary | ICD-10-CM | POA: Diagnosis not present

## 2020-08-16 DIAGNOSIS — I4891 Unspecified atrial fibrillation: Secondary | ICD-10-CM | POA: Diagnosis not present

## 2020-08-16 DIAGNOSIS — I10 Essential (primary) hypertension: Secondary | ICD-10-CM | POA: Diagnosis not present

## 2020-08-16 DIAGNOSIS — J449 Chronic obstructive pulmonary disease, unspecified: Secondary | ICD-10-CM | POA: Diagnosis not present

## 2020-08-16 DIAGNOSIS — I5031 Acute diastolic (congestive) heart failure: Secondary | ICD-10-CM | POA: Diagnosis not present

## 2020-08-16 DIAGNOSIS — G894 Chronic pain syndrome: Secondary | ICD-10-CM | POA: Diagnosis not present

## 2020-08-16 DIAGNOSIS — K219 Gastro-esophageal reflux disease without esophagitis: Secondary | ICD-10-CM | POA: Diagnosis not present

## 2020-08-20 DIAGNOSIS — I110 Hypertensive heart disease with heart failure: Secondary | ICD-10-CM | POA: Diagnosis not present

## 2020-08-20 DIAGNOSIS — I5031 Acute diastolic (congestive) heart failure: Secondary | ICD-10-CM | POA: Diagnosis not present

## 2020-08-20 DIAGNOSIS — I4891 Unspecified atrial fibrillation: Secondary | ICD-10-CM | POA: Diagnosis not present

## 2020-08-20 DIAGNOSIS — J449 Chronic obstructive pulmonary disease, unspecified: Secondary | ICD-10-CM | POA: Diagnosis not present

## 2020-08-20 DIAGNOSIS — J9601 Acute respiratory failure with hypoxia: Secondary | ICD-10-CM | POA: Diagnosis not present

## 2020-08-21 DIAGNOSIS — I5031 Acute diastolic (congestive) heart failure: Secondary | ICD-10-CM | POA: Diagnosis not present

## 2020-08-21 DIAGNOSIS — J449 Chronic obstructive pulmonary disease, unspecified: Secondary | ICD-10-CM | POA: Diagnosis not present

## 2020-08-21 DIAGNOSIS — I4891 Unspecified atrial fibrillation: Secondary | ICD-10-CM | POA: Diagnosis not present

## 2020-08-21 DIAGNOSIS — I110 Hypertensive heart disease with heart failure: Secondary | ICD-10-CM | POA: Diagnosis not present

## 2020-08-21 DIAGNOSIS — J9601 Acute respiratory failure with hypoxia: Secondary | ICD-10-CM | POA: Diagnosis not present

## 2020-08-22 DIAGNOSIS — I4891 Unspecified atrial fibrillation: Secondary | ICD-10-CM | POA: Diagnosis not present

## 2020-08-22 DIAGNOSIS — J449 Chronic obstructive pulmonary disease, unspecified: Secondary | ICD-10-CM | POA: Diagnosis not present

## 2020-08-22 DIAGNOSIS — J9601 Acute respiratory failure with hypoxia: Secondary | ICD-10-CM | POA: Diagnosis not present

## 2020-08-22 DIAGNOSIS — I110 Hypertensive heart disease with heart failure: Secondary | ICD-10-CM | POA: Diagnosis not present

## 2020-08-22 DIAGNOSIS — I5031 Acute diastolic (congestive) heart failure: Secondary | ICD-10-CM | POA: Diagnosis not present

## 2020-08-27 DIAGNOSIS — I110 Hypertensive heart disease with heart failure: Secondary | ICD-10-CM | POA: Diagnosis not present

## 2020-08-27 DIAGNOSIS — I4891 Unspecified atrial fibrillation: Secondary | ICD-10-CM | POA: Diagnosis not present

## 2020-08-27 DIAGNOSIS — J9601 Acute respiratory failure with hypoxia: Secondary | ICD-10-CM | POA: Diagnosis not present

## 2020-08-27 DIAGNOSIS — J449 Chronic obstructive pulmonary disease, unspecified: Secondary | ICD-10-CM | POA: Diagnosis not present

## 2020-08-27 DIAGNOSIS — I5031 Acute diastolic (congestive) heart failure: Secondary | ICD-10-CM | POA: Diagnosis not present

## 2020-08-28 DIAGNOSIS — R531 Weakness: Secondary | ICD-10-CM | POA: Diagnosis not present

## 2020-08-28 DIAGNOSIS — Z86711 Personal history of pulmonary embolism: Secondary | ICD-10-CM | POA: Diagnosis not present

## 2020-08-28 DIAGNOSIS — J439 Emphysema, unspecified: Secondary | ICD-10-CM | POA: Diagnosis not present

## 2020-08-28 DIAGNOSIS — I4891 Unspecified atrial fibrillation: Secondary | ICD-10-CM | POA: Diagnosis not present

## 2020-08-28 DIAGNOSIS — Z902 Acquired absence of lung [part of]: Secondary | ICD-10-CM | POA: Diagnosis not present

## 2020-08-28 DIAGNOSIS — Z20822 Contact with and (suspected) exposure to covid-19: Secondary | ICD-10-CM | POA: Diagnosis not present

## 2020-08-28 DIAGNOSIS — F17210 Nicotine dependence, cigarettes, uncomplicated: Secondary | ICD-10-CM | POA: Diagnosis not present

## 2020-08-28 DIAGNOSIS — H538 Other visual disturbances: Secondary | ICD-10-CM | POA: Diagnosis not present

## 2020-08-28 DIAGNOSIS — J9811 Atelectasis: Secondary | ICD-10-CM | POA: Diagnosis not present

## 2020-08-28 DIAGNOSIS — J9621 Acute and chronic respiratory failure with hypoxia: Secondary | ICD-10-CM | POA: Diagnosis not present

## 2020-08-28 DIAGNOSIS — Z95828 Presence of other vascular implants and grafts: Secondary | ICD-10-CM | POA: Diagnosis not present

## 2020-08-28 DIAGNOSIS — E785 Hyperlipidemia, unspecified: Secondary | ICD-10-CM | POA: Diagnosis not present

## 2020-08-28 DIAGNOSIS — R0602 Shortness of breath: Secondary | ICD-10-CM | POA: Diagnosis not present

## 2020-08-28 DIAGNOSIS — I5032 Chronic diastolic (congestive) heart failure: Secondary | ICD-10-CM | POA: Diagnosis not present

## 2020-08-28 DIAGNOSIS — Z9981 Dependence on supplemental oxygen: Secondary | ICD-10-CM | POA: Diagnosis not present

## 2020-08-28 DIAGNOSIS — Z87440 Personal history of urinary (tract) infections: Secondary | ICD-10-CM | POA: Diagnosis not present

## 2020-08-28 DIAGNOSIS — J9601 Acute respiratory failure with hypoxia: Secondary | ICD-10-CM | POA: Diagnosis not present

## 2020-08-28 DIAGNOSIS — I700 Atherosclerosis of aorta: Secondary | ICD-10-CM | POA: Diagnosis not present

## 2020-08-28 DIAGNOSIS — I5031 Acute diastolic (congestive) heart failure: Secondary | ICD-10-CM | POA: Diagnosis not present

## 2020-08-28 DIAGNOSIS — Z79899 Other long term (current) drug therapy: Secondary | ICD-10-CM | POA: Diagnosis not present

## 2020-08-28 DIAGNOSIS — M1990 Unspecified osteoarthritis, unspecified site: Secondary | ICD-10-CM | POA: Diagnosis not present

## 2020-08-28 DIAGNOSIS — Z7901 Long term (current) use of anticoagulants: Secondary | ICD-10-CM | POA: Diagnosis not present

## 2020-08-28 DIAGNOSIS — Z885 Allergy status to narcotic agent status: Secondary | ICD-10-CM | POA: Diagnosis not present

## 2020-08-28 DIAGNOSIS — F32A Depression, unspecified: Secondary | ICD-10-CM | POA: Diagnosis not present

## 2020-08-28 DIAGNOSIS — Z743 Need for continuous supervision: Secondary | ICD-10-CM | POA: Diagnosis not present

## 2020-08-28 DIAGNOSIS — G894 Chronic pain syndrome: Secondary | ICD-10-CM | POA: Diagnosis not present

## 2020-08-28 DIAGNOSIS — R42 Dizziness and giddiness: Secondary | ICD-10-CM | POA: Diagnosis not present

## 2020-08-28 DIAGNOSIS — I110 Hypertensive heart disease with heart failure: Secondary | ICD-10-CM | POA: Diagnosis not present

## 2020-08-28 DIAGNOSIS — D649 Anemia, unspecified: Secondary | ICD-10-CM | POA: Diagnosis not present

## 2020-08-28 DIAGNOSIS — J449 Chronic obstructive pulmonary disease, unspecified: Secondary | ICD-10-CM | POA: Diagnosis not present

## 2020-08-28 DIAGNOSIS — Z8673 Personal history of transient ischemic attack (TIA), and cerebral infarction without residual deficits: Secondary | ICD-10-CM | POA: Diagnosis not present

## 2020-08-28 DIAGNOSIS — I739 Peripheral vascular disease, unspecified: Secondary | ICD-10-CM | POA: Diagnosis not present

## 2020-08-28 DIAGNOSIS — Z85118 Personal history of other malignant neoplasm of bronchus and lung: Secondary | ICD-10-CM | POA: Diagnosis not present

## 2020-08-31 DIAGNOSIS — I4891 Unspecified atrial fibrillation: Secondary | ICD-10-CM | POA: Diagnosis not present

## 2020-08-31 DIAGNOSIS — I5031 Acute diastolic (congestive) heart failure: Secondary | ICD-10-CM | POA: Diagnosis not present

## 2020-08-31 DIAGNOSIS — I110 Hypertensive heart disease with heart failure: Secondary | ICD-10-CM | POA: Diagnosis not present

## 2020-08-31 DIAGNOSIS — J449 Chronic obstructive pulmonary disease, unspecified: Secondary | ICD-10-CM | POA: Diagnosis not present

## 2020-08-31 DIAGNOSIS — J9601 Acute respiratory failure with hypoxia: Secondary | ICD-10-CM | POA: Diagnosis not present

## 2020-09-02 ENCOUNTER — Other Ambulatory Visit: Payer: Self-pay | Admitting: *Deleted

## 2020-09-02 DIAGNOSIS — G8918 Other acute postprocedural pain: Secondary | ICD-10-CM | POA: Diagnosis not present

## 2020-09-02 DIAGNOSIS — J9601 Acute respiratory failure with hypoxia: Secondary | ICD-10-CM | POA: Diagnosis not present

## 2020-09-02 DIAGNOSIS — M545 Low back pain, unspecified: Secondary | ICD-10-CM | POA: Diagnosis not present

## 2020-09-02 DIAGNOSIS — M5440 Lumbago with sciatica, unspecified side: Secondary | ICD-10-CM | POA: Diagnosis not present

## 2020-09-02 DIAGNOSIS — J449 Chronic obstructive pulmonary disease, unspecified: Secondary | ICD-10-CM | POA: Diagnosis not present

## 2020-09-02 DIAGNOSIS — G8929 Other chronic pain: Secondary | ICD-10-CM | POA: Diagnosis not present

## 2020-09-02 DIAGNOSIS — G894 Chronic pain syndrome: Secondary | ICD-10-CM | POA: Diagnosis not present

## 2020-09-02 DIAGNOSIS — I110 Hypertensive heart disease with heart failure: Secondary | ICD-10-CM | POA: Diagnosis not present

## 2020-09-02 DIAGNOSIS — Z720 Tobacco use: Secondary | ICD-10-CM | POA: Diagnosis not present

## 2020-09-02 DIAGNOSIS — I5031 Acute diastolic (congestive) heart failure: Secondary | ICD-10-CM | POA: Diagnosis not present

## 2020-09-02 DIAGNOSIS — I4891 Unspecified atrial fibrillation: Secondary | ICD-10-CM | POA: Diagnosis not present

## 2020-09-02 DIAGNOSIS — M542 Cervicalgia: Secondary | ICD-10-CM | POA: Diagnosis not present

## 2020-09-02 NOTE — Patient Outreach (Signed)
[facility]) Care Management  09/02/2020  AL GAGEN Nov 15, 1932 [id number]   Transition of care-Unsuccessful [facility]   RN attempted the initial outreach however unsuccessful. RN able to leave a HIPAA approved voice message requesting a call back.  Will schedule another outreach call over the next week for pending services.  [name], RN Care Management Coordinator [place] Office [phone]

## 2020-09-03 DIAGNOSIS — I509 Heart failure, unspecified: Secondary | ICD-10-CM | POA: Diagnosis not present

## 2020-09-03 DIAGNOSIS — I110 Hypertensive heart disease with heart failure: Secondary | ICD-10-CM | POA: Diagnosis not present

## 2020-09-03 DIAGNOSIS — Z9981 Dependence on supplemental oxygen: Secondary | ICD-10-CM | POA: Diagnosis not present

## 2020-09-03 DIAGNOSIS — J449 Chronic obstructive pulmonary disease, unspecified: Secondary | ICD-10-CM | POA: Diagnosis not present

## 2020-09-03 DIAGNOSIS — I10 Essential (primary) hypertension: Secondary | ICD-10-CM | POA: Diagnosis not present

## 2020-09-03 DIAGNOSIS — J9601 Acute respiratory failure with hypoxia: Secondary | ICD-10-CM | POA: Diagnosis not present

## 2020-09-03 DIAGNOSIS — I5031 Acute diastolic (congestive) heart failure: Secondary | ICD-10-CM | POA: Diagnosis not present

## 2020-09-03 DIAGNOSIS — I4891 Unspecified atrial fibrillation: Secondary | ICD-10-CM | POA: Diagnosis not present

## 2020-09-03 DIAGNOSIS — E876 Hypokalemia: Secondary | ICD-10-CM | POA: Diagnosis not present

## 2020-09-05 DIAGNOSIS — I5031 Acute diastolic (congestive) heart failure: Secondary | ICD-10-CM | POA: Diagnosis not present

## 2020-09-05 DIAGNOSIS — I110 Hypertensive heart disease with heart failure: Secondary | ICD-10-CM | POA: Diagnosis not present

## 2020-09-05 DIAGNOSIS — I4891 Unspecified atrial fibrillation: Secondary | ICD-10-CM | POA: Diagnosis not present

## 2020-09-05 DIAGNOSIS — J449 Chronic obstructive pulmonary disease, unspecified: Secondary | ICD-10-CM | POA: Diagnosis not present

## 2020-09-05 DIAGNOSIS — J9601 Acute respiratory failure with hypoxia: Secondary | ICD-10-CM | POA: Diagnosis not present

## 2020-09-06 ENCOUNTER — Other Ambulatory Visit: Payer: Self-pay | Admitting: *Deleted

## 2020-09-06 DIAGNOSIS — J449 Chronic obstructive pulmonary disease, unspecified: Secondary | ICD-10-CM | POA: Diagnosis not present

## 2020-09-06 DIAGNOSIS — I4891 Unspecified atrial fibrillation: Secondary | ICD-10-CM | POA: Diagnosis not present

## 2020-09-06 DIAGNOSIS — I110 Hypertensive heart disease with heart failure: Secondary | ICD-10-CM | POA: Diagnosis not present

## 2020-09-06 DIAGNOSIS — I5031 Acute diastolic (congestive) heart failure: Secondary | ICD-10-CM | POA: Diagnosis not present

## 2020-09-06 DIAGNOSIS — J9601 Acute respiratory failure with hypoxia: Secondary | ICD-10-CM | POA: Diagnosis not present

## 2020-09-06 NOTE — Patient Outreach (Signed)
[facility]) Care Management  09/06/2020  Courtney Grant 02/15/1933 [id number]   Telephone Assessment-Successful  RN spoke with pt today and explained [facility] services and the purpose for today's call. Pt recent discharged from rehabilitation and discharged home with [facility] services. Verified no issues with her HTN or COPD with 24/7 on 2 liters of home oxygen. Lives with her supportive daughter Courtney Grant who assist with transportation along with all her prescribed medications. No immediate needs at this time.  Will send appropriate letters to pt and provider and follow up in a few weeks on pt's ongoing care. Pt states her [facility] services just arrived and receptive to [facility] services with an outreach in a couple of weeks. Will follow up at that time for ongoing case management services while pending [facility] services.  [name], RN Care Management Coordinator [place] Office [phone]

## 2020-09-08 DIAGNOSIS — I5031 Acute diastolic (congestive) heart failure: Secondary | ICD-10-CM | POA: Diagnosis not present

## 2020-09-08 DIAGNOSIS — J449 Chronic obstructive pulmonary disease, unspecified: Secondary | ICD-10-CM | POA: Diagnosis not present

## 2020-09-08 DIAGNOSIS — I110 Hypertensive heart disease with heart failure: Secondary | ICD-10-CM | POA: Diagnosis not present

## 2020-09-08 DIAGNOSIS — I4891 Unspecified atrial fibrillation: Secondary | ICD-10-CM | POA: Diagnosis not present

## 2020-09-08 DIAGNOSIS — J9601 Acute respiratory failure with hypoxia: Secondary | ICD-10-CM | POA: Diagnosis not present

## 2020-09-09 DIAGNOSIS — I110 Hypertensive heart disease with heart failure: Secondary | ICD-10-CM | POA: Diagnosis not present

## 2020-09-09 DIAGNOSIS — J9601 Acute respiratory failure with hypoxia: Secondary | ICD-10-CM | POA: Diagnosis not present

## 2020-09-09 DIAGNOSIS — J449 Chronic obstructive pulmonary disease, unspecified: Secondary | ICD-10-CM | POA: Diagnosis not present

## 2020-09-09 DIAGNOSIS — I5031 Acute diastolic (congestive) heart failure: Secondary | ICD-10-CM | POA: Diagnosis not present

## 2020-09-09 DIAGNOSIS — I4891 Unspecified atrial fibrillation: Secondary | ICD-10-CM | POA: Diagnosis not present

## 2020-09-11 DIAGNOSIS — J449 Chronic obstructive pulmonary disease, unspecified: Secondary | ICD-10-CM | POA: Diagnosis not present

## 2020-09-11 DIAGNOSIS — I110 Hypertensive heart disease with heart failure: Secondary | ICD-10-CM | POA: Diagnosis not present

## 2020-09-11 DIAGNOSIS — I5031 Acute diastolic (congestive) heart failure: Secondary | ICD-10-CM | POA: Diagnosis not present

## 2020-09-11 DIAGNOSIS — J9601 Acute respiratory failure with hypoxia: Secondary | ICD-10-CM | POA: Diagnosis not present

## 2020-09-11 DIAGNOSIS — I4891 Unspecified atrial fibrillation: Secondary | ICD-10-CM | POA: Diagnosis not present

## 2020-09-12 DIAGNOSIS — J449 Chronic obstructive pulmonary disease, unspecified: Secondary | ICD-10-CM | POA: Diagnosis not present

## 2020-09-12 DIAGNOSIS — I110 Hypertensive heart disease with heart failure: Secondary | ICD-10-CM | POA: Diagnosis not present

## 2020-09-12 DIAGNOSIS — J9601 Acute respiratory failure with hypoxia: Secondary | ICD-10-CM | POA: Diagnosis not present

## 2020-09-12 DIAGNOSIS — I4891 Unspecified atrial fibrillation: Secondary | ICD-10-CM | POA: Diagnosis not present

## 2020-09-12 DIAGNOSIS — I5031 Acute diastolic (congestive) heart failure: Secondary | ICD-10-CM | POA: Diagnosis not present

## 2020-09-13 ENCOUNTER — Encounter: Payer: Self-pay | Admitting: *Deleted

## 2020-09-13 ENCOUNTER — Encounter: Payer: Self-pay | Admitting: Cardiology

## 2020-09-13 DIAGNOSIS — I2699 Other pulmonary embolism without acute cor pulmonale: Secondary | ICD-10-CM | POA: Insufficient documentation

## 2020-09-13 HISTORY — DX: Other pulmonary embolism without acute cor pulmonale: I26.99

## 2020-09-17 DIAGNOSIS — Z86711 Personal history of pulmonary embolism: Secondary | ICD-10-CM | POA: Diagnosis not present

## 2020-09-17 DIAGNOSIS — Z85118 Personal history of other malignant neoplasm of bronchus and lung: Secondary | ICD-10-CM | POA: Diagnosis not present

## 2020-09-17 DIAGNOSIS — Z7901 Long term (current) use of anticoagulants: Secondary | ICD-10-CM | POA: Diagnosis not present

## 2020-09-17 DIAGNOSIS — F32A Depression, unspecified: Secondary | ICD-10-CM | POA: Diagnosis not present

## 2020-09-17 DIAGNOSIS — G894 Chronic pain syndrome: Secondary | ICD-10-CM | POA: Diagnosis not present

## 2020-09-17 DIAGNOSIS — J9601 Acute respiratory failure with hypoxia: Secondary | ICD-10-CM | POA: Diagnosis not present

## 2020-09-17 DIAGNOSIS — I5031 Acute diastolic (congestive) heart failure: Secondary | ICD-10-CM | POA: Diagnosis not present

## 2020-09-17 DIAGNOSIS — M5416 Radiculopathy, lumbar region: Secondary | ICD-10-CM | POA: Diagnosis not present

## 2020-09-17 DIAGNOSIS — Z9181 History of falling: Secondary | ICD-10-CM | POA: Diagnosis not present

## 2020-09-17 DIAGNOSIS — I110 Hypertensive heart disease with heart failure: Secondary | ICD-10-CM | POA: Diagnosis not present

## 2020-09-17 DIAGNOSIS — K219 Gastro-esophageal reflux disease without esophagitis: Secondary | ICD-10-CM | POA: Diagnosis not present

## 2020-09-17 DIAGNOSIS — Z95828 Presence of other vascular implants and grafts: Secondary | ICD-10-CM | POA: Diagnosis not present

## 2020-09-17 DIAGNOSIS — J449 Chronic obstructive pulmonary disease, unspecified: Secondary | ICD-10-CM | POA: Diagnosis not present

## 2020-09-17 DIAGNOSIS — Z9981 Dependence on supplemental oxygen: Secondary | ICD-10-CM | POA: Diagnosis not present

## 2020-09-17 DIAGNOSIS — Z902 Acquired absence of lung [part of]: Secondary | ICD-10-CM | POA: Diagnosis not present

## 2020-09-17 DIAGNOSIS — Z8673 Personal history of transient ischemic attack (TIA), and cerebral infarction without residual deficits: Secondary | ICD-10-CM | POA: Diagnosis not present

## 2020-09-17 DIAGNOSIS — I4891 Unspecified atrial fibrillation: Secondary | ICD-10-CM | POA: Diagnosis not present

## 2020-09-18 DIAGNOSIS — J449 Chronic obstructive pulmonary disease, unspecified: Secondary | ICD-10-CM | POA: Diagnosis not present

## 2020-09-19 DIAGNOSIS — Z9981 Dependence on supplemental oxygen: Secondary | ICD-10-CM | POA: Diagnosis not present

## 2020-09-19 DIAGNOSIS — Z86711 Personal history of pulmonary embolism: Secondary | ICD-10-CM | POA: Diagnosis not present

## 2020-09-19 DIAGNOSIS — J449 Chronic obstructive pulmonary disease, unspecified: Secondary | ICD-10-CM | POA: Diagnosis not present

## 2020-09-19 DIAGNOSIS — Z8673 Personal history of transient ischemic attack (TIA), and cerebral infarction without residual deficits: Secondary | ICD-10-CM | POA: Diagnosis not present

## 2020-09-19 DIAGNOSIS — Z85118 Personal history of other malignant neoplasm of bronchus and lung: Secondary | ICD-10-CM | POA: Diagnosis not present

## 2020-09-19 DIAGNOSIS — Z95828 Presence of other vascular implants and grafts: Secondary | ICD-10-CM | POA: Diagnosis not present

## 2020-09-19 DIAGNOSIS — G894 Chronic pain syndrome: Secondary | ICD-10-CM | POA: Diagnosis not present

## 2020-09-19 DIAGNOSIS — J9601 Acute respiratory failure with hypoxia: Secondary | ICD-10-CM | POA: Diagnosis not present

## 2020-09-19 DIAGNOSIS — F32A Depression, unspecified: Secondary | ICD-10-CM | POA: Diagnosis not present

## 2020-09-19 DIAGNOSIS — K219 Gastro-esophageal reflux disease without esophagitis: Secondary | ICD-10-CM | POA: Diagnosis not present

## 2020-09-19 DIAGNOSIS — I110 Hypertensive heart disease with heart failure: Secondary | ICD-10-CM | POA: Diagnosis not present

## 2020-09-19 DIAGNOSIS — Z7901 Long term (current) use of anticoagulants: Secondary | ICD-10-CM | POA: Diagnosis not present

## 2020-09-19 DIAGNOSIS — M5416 Radiculopathy, lumbar region: Secondary | ICD-10-CM | POA: Diagnosis not present

## 2020-09-19 DIAGNOSIS — Z9181 History of falling: Secondary | ICD-10-CM | POA: Diagnosis not present

## 2020-09-19 DIAGNOSIS — I5031 Acute diastolic (congestive) heart failure: Secondary | ICD-10-CM | POA: Diagnosis not present

## 2020-09-19 DIAGNOSIS — Z902 Acquired absence of lung [part of]: Secondary | ICD-10-CM | POA: Diagnosis not present

## 2020-09-19 DIAGNOSIS — I4891 Unspecified atrial fibrillation: Secondary | ICD-10-CM | POA: Diagnosis not present

## 2020-09-23 DIAGNOSIS — Z86711 Personal history of pulmonary embolism: Secondary | ICD-10-CM | POA: Diagnosis not present

## 2020-09-23 DIAGNOSIS — J9601 Acute respiratory failure with hypoxia: Secondary | ICD-10-CM | POA: Diagnosis not present

## 2020-09-23 DIAGNOSIS — Z8673 Personal history of transient ischemic attack (TIA), and cerebral infarction without residual deficits: Secondary | ICD-10-CM | POA: Diagnosis not present

## 2020-09-23 DIAGNOSIS — Z9181 History of falling: Secondary | ICD-10-CM | POA: Diagnosis not present

## 2020-09-23 DIAGNOSIS — M5416 Radiculopathy, lumbar region: Secondary | ICD-10-CM | POA: Diagnosis not present

## 2020-09-23 DIAGNOSIS — J449 Chronic obstructive pulmonary disease, unspecified: Secondary | ICD-10-CM | POA: Diagnosis not present

## 2020-09-23 DIAGNOSIS — Z85118 Personal history of other malignant neoplasm of bronchus and lung: Secondary | ICD-10-CM | POA: Diagnosis not present

## 2020-09-23 DIAGNOSIS — I5031 Acute diastolic (congestive) heart failure: Secondary | ICD-10-CM | POA: Diagnosis not present

## 2020-09-23 DIAGNOSIS — Z9981 Dependence on supplemental oxygen: Secondary | ICD-10-CM | POA: Diagnosis not present

## 2020-09-23 DIAGNOSIS — Z902 Acquired absence of lung [part of]: Secondary | ICD-10-CM | POA: Diagnosis not present

## 2020-09-23 DIAGNOSIS — Z95828 Presence of other vascular implants and grafts: Secondary | ICD-10-CM | POA: Diagnosis not present

## 2020-09-23 DIAGNOSIS — K219 Gastro-esophageal reflux disease without esophagitis: Secondary | ICD-10-CM | POA: Diagnosis not present

## 2020-09-23 DIAGNOSIS — G894 Chronic pain syndrome: Secondary | ICD-10-CM | POA: Diagnosis not present

## 2020-09-23 DIAGNOSIS — I4891 Unspecified atrial fibrillation: Secondary | ICD-10-CM | POA: Diagnosis not present

## 2020-09-23 DIAGNOSIS — Z7901 Long term (current) use of anticoagulants: Secondary | ICD-10-CM | POA: Diagnosis not present

## 2020-09-23 DIAGNOSIS — I110 Hypertensive heart disease with heart failure: Secondary | ICD-10-CM | POA: Diagnosis not present

## 2020-09-23 DIAGNOSIS — F32A Depression, unspecified: Secondary | ICD-10-CM | POA: Diagnosis not present

## 2020-09-25 DIAGNOSIS — M5416 Radiculopathy, lumbar region: Secondary | ICD-10-CM | POA: Diagnosis not present

## 2020-09-25 DIAGNOSIS — Z85118 Personal history of other malignant neoplasm of bronchus and lung: Secondary | ICD-10-CM | POA: Diagnosis not present

## 2020-09-25 DIAGNOSIS — Z902 Acquired absence of lung [part of]: Secondary | ICD-10-CM | POA: Diagnosis not present

## 2020-09-25 DIAGNOSIS — J449 Chronic obstructive pulmonary disease, unspecified: Secondary | ICD-10-CM | POA: Diagnosis not present

## 2020-09-25 DIAGNOSIS — Z7901 Long term (current) use of anticoagulants: Secondary | ICD-10-CM | POA: Diagnosis not present

## 2020-09-25 DIAGNOSIS — Z86711 Personal history of pulmonary embolism: Secondary | ICD-10-CM | POA: Diagnosis not present

## 2020-09-25 DIAGNOSIS — F32A Depression, unspecified: Secondary | ICD-10-CM | POA: Diagnosis not present

## 2020-09-25 DIAGNOSIS — Z95828 Presence of other vascular implants and grafts: Secondary | ICD-10-CM | POA: Diagnosis not present

## 2020-09-25 DIAGNOSIS — I5031 Acute diastolic (congestive) heart failure: Secondary | ICD-10-CM | POA: Diagnosis not present

## 2020-09-25 DIAGNOSIS — G894 Chronic pain syndrome: Secondary | ICD-10-CM | POA: Diagnosis not present

## 2020-09-25 DIAGNOSIS — I4891 Unspecified atrial fibrillation: Secondary | ICD-10-CM | POA: Diagnosis not present

## 2020-09-25 DIAGNOSIS — J9601 Acute respiratory failure with hypoxia: Secondary | ICD-10-CM | POA: Diagnosis not present

## 2020-09-25 DIAGNOSIS — Z9181 History of falling: Secondary | ICD-10-CM | POA: Diagnosis not present

## 2020-09-25 DIAGNOSIS — I110 Hypertensive heart disease with heart failure: Secondary | ICD-10-CM | POA: Diagnosis not present

## 2020-09-25 DIAGNOSIS — Z9981 Dependence on supplemental oxygen: Secondary | ICD-10-CM | POA: Diagnosis not present

## 2020-09-25 DIAGNOSIS — K219 Gastro-esophageal reflux disease without esophagitis: Secondary | ICD-10-CM | POA: Diagnosis not present

## 2020-09-25 DIAGNOSIS — Z8673 Personal history of transient ischemic attack (TIA), and cerebral infarction without residual deficits: Secondary | ICD-10-CM | POA: Diagnosis not present

## 2020-10-04 ENCOUNTER — Other Ambulatory Visit: Payer: Self-pay | Admitting: *Deleted

## 2020-10-04 ENCOUNTER — Ambulatory Visit: Payer: Medicare Other | Admitting: *Deleted

## 2020-10-04 NOTE — Patient Outreach (Signed)
[facility]) Care Management  10/04/2020  MARGURIETE WOOTAN 1932-08-04 [id number]   Telephone Assessment-Successful-COPD Pt did not wish to completed the full assessment. Note pt is with Dr. Reesa Chew [facility])  RN spoke with pt and daughter [name]) today. Pt reports she continues to use her home O2 on 2 liters however her recent [facility] services has ended with no additional visits. Pt continue to do well with no additional falls or injuries and verified she has sufficient transport services for any pending appointments and all her ongoing medications. Pt requested RN to contact her daughter [name] to alert if pt needs [facility] services and how to contact us directly. Also reports she has her son-in-law when her daughter is at work for supportive care.  Verified pt received [facility] packet and agreed to an ongoing follow up call next month for possible needs. Continue to encouraged pt on use of her home O2 and COPD medications as prescribed. Pt remains in the GREEN zone today with no acute issues encountered.  [name], RN Care Management Coordinator [place] Office [phone]

## 2020-10-14 DIAGNOSIS — M542 Cervicalgia: Secondary | ICD-10-CM | POA: Diagnosis not present

## 2020-10-14 DIAGNOSIS — G8918 Other acute postprocedural pain: Secondary | ICD-10-CM | POA: Diagnosis not present

## 2020-10-14 DIAGNOSIS — Z720 Tobacco use: Secondary | ICD-10-CM | POA: Diagnosis not present

## 2020-10-14 DIAGNOSIS — M545 Low back pain, unspecified: Secondary | ICD-10-CM | POA: Diagnosis not present

## 2020-10-14 DIAGNOSIS — G894 Chronic pain syndrome: Secondary | ICD-10-CM | POA: Diagnosis not present

## 2020-10-15 ENCOUNTER — Other Ambulatory Visit: Payer: Self-pay

## 2020-10-15 DIAGNOSIS — E785 Hyperlipidemia, unspecified: Secondary | ICD-10-CM | POA: Insufficient documentation

## 2020-10-15 DIAGNOSIS — I509 Heart failure, unspecified: Secondary | ICD-10-CM | POA: Insufficient documentation

## 2020-10-15 DIAGNOSIS — Z79891 Long term (current) use of opiate analgesic: Secondary | ICD-10-CM | POA: Diagnosis not present

## 2020-10-15 DIAGNOSIS — E876 Hypokalemia: Secondary | ICD-10-CM | POA: Insufficient documentation

## 2020-10-15 DIAGNOSIS — I4891 Unspecified atrial fibrillation: Secondary | ICD-10-CM | POA: Insufficient documentation

## 2020-10-15 DIAGNOSIS — T8859XA Other complications of anesthesia, initial encounter: Secondary | ICD-10-CM | POA: Insufficient documentation

## 2020-10-15 DIAGNOSIS — K2270 Barrett's esophagus without dysplasia: Secondary | ICD-10-CM | POA: Insufficient documentation

## 2020-10-16 ENCOUNTER — Ambulatory Visit: Payer: Medicare Other | Admitting: Cardiology

## 2020-10-19 DIAGNOSIS — J449 Chronic obstructive pulmonary disease, unspecified: Secondary | ICD-10-CM | POA: Diagnosis not present

## 2020-11-01 ENCOUNTER — Other Ambulatory Visit: Payer: Self-pay | Admitting: *Deleted

## 2020-11-01 ENCOUNTER — Encounter: Payer: Self-pay | Admitting: *Deleted

## 2020-11-01 NOTE — Patient Outreach (Signed)
[facility]) Care Management  11/01/2020  Courtney Grant [birth year] [id number]  Telephone Assessment-Successful-COPD  Spoke with daughter Courtney Grant today who agreed to a program for pt to be followed by [facility] for monthly case management services. Initial pt decline however was able to further discussed and explain to daughter for further information on enrollment. Discussed the plan of care along with goals/interventions and inquired on any changes over the last month. Daughter states pt is not on Cardizem and feeling much better with no additional symptoms. Pt is in the GREEN zone with her COPD with no flare ups or exacerbated symptoms. Addressed all inquires and continue to introduce other [facility] services that are available to the pt if needed.  Will continue to encourage adherence to the plan of care and follow up next month with ongoing care management needs that maybe presented. Will continue to communicate with the provider on today's assessment and follow up quarterly on ongoing care management services provided to this pt.   Goals Addressed             This Visit's Progress    [place] My Quality of Life       Timeframe:  Long-Range Goal Priority:  Medium Start Date:   11/01/2020                          Expected End Date:  02/12/2021                     Follow Up Date 12/02/2020  Barriers: Health Behaviors      - complete a living will - do one enjoyable thing every day - make shared treatment decisions with doctor - spend time with a child every day, borrow one if I have to    Why is this important?   Having a long-term illness can be scary.  It can also be stressful for you and your caregiver.  These steps may help.    Notes:  8/19-Discuss with caregiver pt's routine and daily quality of life. Pt has a good support system with live in daughter and son-in-law who provides pt with transportation when needed.     Track and Manage My Symptoms-COPD        Timeframe:  Short-Term Goal Priority:  Medium Start Date: 11/01/2020                            Expected End Date:   12/13/2020                    Follow Up Date 12/02/2020    - begin a symptom diary - bring symptom diary to all visits - develop a rescue plan - eliminate symptom triggers at home - follow rescue plan if symptoms flare-up - keep follow-up appointments Barriers: Health Behaviors Knowledge     Why is this important?   Tracking your symptoms and other information about your health helps your doctor plan your care.  Write down the symptoms, the time of day, what you were doing and what medicine you are taking.  You will soon learn how to manage your symptoms.     Notes:  8/19-Verified pt remains in the GREEN zone and continues to have needed resources to assist with any flare up or encountered symptoms.           [name], RN Care Management Coordinator [place]  [facility] [phone]

## 2020-11-13 ENCOUNTER — Encounter: Payer: Self-pay | Admitting: *Deleted

## 2020-11-19 DIAGNOSIS — J449 Chronic obstructive pulmonary disease, unspecified: Secondary | ICD-10-CM | POA: Diagnosis not present

## 2020-11-20 DIAGNOSIS — M79669 Pain in unspecified lower leg: Secondary | ICD-10-CM | POA: Diagnosis not present

## 2020-11-20 DIAGNOSIS — M542 Cervicalgia: Secondary | ICD-10-CM | POA: Diagnosis not present

## 2020-11-20 DIAGNOSIS — G8918 Other acute postprocedural pain: Secondary | ICD-10-CM | POA: Diagnosis not present

## 2020-11-20 DIAGNOSIS — G894 Chronic pain syndrome: Secondary | ICD-10-CM | POA: Diagnosis not present

## 2020-11-20 DIAGNOSIS — M79661 Pain in right lower leg: Secondary | ICD-10-CM | POA: Diagnosis not present

## 2020-11-20 DIAGNOSIS — M545 Low back pain, unspecified: Secondary | ICD-10-CM | POA: Diagnosis not present

## 2020-11-20 DIAGNOSIS — Z720 Tobacco use: Secondary | ICD-10-CM | POA: Diagnosis not present

## 2020-11-20 DIAGNOSIS — M179 Osteoarthritis of knee, unspecified: Secondary | ICD-10-CM | POA: Diagnosis not present

## 2020-11-20 DIAGNOSIS — J439 Emphysema, unspecified: Secondary | ICD-10-CM | POA: Diagnosis not present

## 2020-11-20 DIAGNOSIS — M169 Osteoarthritis of hip, unspecified: Secondary | ICD-10-CM | POA: Diagnosis not present

## 2020-12-02 ENCOUNTER — Other Ambulatory Visit: Payer: Self-pay | Admitting: *Deleted

## 2020-12-02 ENCOUNTER — Encounter: Payer: Self-pay | Admitting: Cardiology

## 2020-12-02 ENCOUNTER — Ambulatory Visit (INDEPENDENT_AMBULATORY_CARE_PROVIDER_SITE_OTHER): Payer: Medicare Other | Admitting: Cardiology

## 2020-12-02 ENCOUNTER — Other Ambulatory Visit: Payer: Self-pay

## 2020-12-02 VITALS — BP 120/76 | HR 129 | Ht 63.0 in | Wt 152.4 lb

## 2020-12-02 DIAGNOSIS — F17210 Nicotine dependence, cigarettes, uncomplicated: Secondary | ICD-10-CM | POA: Diagnosis not present

## 2020-12-02 DIAGNOSIS — I4891 Unspecified atrial fibrillation: Secondary | ICD-10-CM | POA: Diagnosis not present

## 2020-12-02 DIAGNOSIS — E782 Mixed hyperlipidemia: Secondary | ICD-10-CM | POA: Diagnosis not present

## 2020-12-02 DIAGNOSIS — I10 Essential (primary) hypertension: Secondary | ICD-10-CM | POA: Diagnosis not present

## 2020-12-02 DIAGNOSIS — Z720 Tobacco use: Secondary | ICD-10-CM

## 2020-12-02 MED ORDER — DIGOXIN 125 MCG PO TABS: 0.1250 mg | ORAL_TABLET | Freq: Every day | ORAL | 3 refills | Status: DC

## 2020-12-02 MED ORDER — APIXABAN 5 MG PO TABS
5.0000 mg | ORAL_TABLET | Freq: Two times a day (BID) | ORAL | 3 refills | Status: DC
Start: 2020-12-02 — End: 2022-08-12

## 2020-12-02 NOTE — Progress Notes (Signed)
Cardiology Office Note:    Date:  12/02/2020   ID:  [name], DOB 1932-08-23, MRN [id number]  PCP:  Garwin Brothers, MD  Cardiologist:  Jenean Lindau, MD   Referring MD: Jaclynn Major, NP    ASSESSMENT:    1. Atrial fibrillation, unspecified type ([place])   2. Tobacco abuse   3. Primary hypertension   4. Mixed hyperlipidemia    PLAN:    In order of problems listed above:  Atherosclerotic vascular disease: Secondary prevention stressed with the patient.  Importance of compliance with diet medication stressed and she vocalized understanding. Cigarette smoker: I spent 5 minutes with the patient discussing solely about smoking. Smoking cessation was counseled. I suggested to the patient also different medications and pharmacological interventions. Patient is keen to try stopping on its own at this time. He will get back to me if he needs any further assistance in this matter. Atrial fibrillation with elevated ventricular rate: I think in part this is causing her symptoms and therefore I have added digoxin 0.125 mg daily to her regimen.  I will check this level when she comes for follow-up in 3 to 4 weeks.  She was advised to keep a track of her heart rate and blood pressures.  I think a large amount of her problem is because of significant pulmonary pathology affecting her heart.I discussed with the patient atrial fibrillation, disease process. Management and therapy including rate and rhythm control, anticoagulation benefits and potential risks were discussed extensively with the patient. Patient had multiple questions which were answered to patient's satisfaction.  She will have CBC and Chem-7 today and stool for occult blood testing in a week.  I initiated her on Eliquis 5 mg twice daily.  She was told to watch for bleeding.  She has had no issues in the past with bleeding she has no contraindications for anticoagulation based on my history evaluation today. COPD on oxygen: Again she  has had lung cancer and continues to smoke unfortunately. Her history of pleural effusion and reduced sounds at the right base makes me want to get a chest x-ray on her and I will send her to [facility] [facility] for the same. Patient will be seen in follow-up appointment in 3 to 4 weeks or earlier if the patient has any concerns    Medication Adjustments/Labs and Tests Ordered: Current medicines are reviewed at length with the patient today.  Concerns regarding medicines are outlined above.  Orders Placed This Encounter  Procedures   Fecal occult blood, imunochemical([facility])   Basic metabolic panel   CBC   EKG 12-Lead   Meds ordered this encounter  Medications   apixaban (ELIQUIS) 5 MG TABS tablet    Sig: Take 1 tablet (5 mg total) by mouth 2 (two) times daily.    Dispense:  180 tablet    Refill:  3   digoxin (LANOXIN) 0.125 MG tablet    Sig: Take 1 tablet (0.125 mg total) by mouth daily.    Dispense:  90 tablet    Refill:  3     History of Present Illness:    Courtney Grant is a 87 y.o. female who is being seen today for the evaluation of atrial fibrillation with elevated ventricular rate at the request of Jaclynn Major, NP.  Patient is a 87 year old female.  She has past medical history of atherosclerotic vascular disease, atrial fibrillation, lung cancer and active cigarette smoking.  She is referred here for atrial fibrillation with rapid  ventricular rate.  She has come in a wheelchair and is oxygen.  Her daughter accompanies her for this visit.  She mentions to me that anytime she tries to do something her heart rate goes high and this is a concern.  At the time of my evaluation, the patient is alert awake oriented and in no distress.  Past Medical History:  Diagnosis Date   Abnormal transaminases    Arthritis 01/29/2016   Overview:  Generalized   Atherosclerosis of native artery of both lower extremities with intermittent claudication ([place]) 10/13/2016    Formatting of this note might be different from the original. Added automatically from request for surgery [phone]   Atrial fibrillation ([place])    Bacteremia due to methicillin susceptible Staphylococcus aureus (MSSA) 10/30/2017   Barrett's esophagus    Benign paroxysmal positional vertigo 10/25/2015   Biliary dyskinesia 01/29/2016   Cancer of lung ([place])    ADENOCARINOMA RUL  Overview:  Overview:  ADENOCARINOMA RUL   Chronic back pain 10/30/2017   Chronic CHF (congestive heart failure) (HCC)    Complication of anesthesia    COPD (chronic obstructive pulmonary disease) ([place])    Depression 01/29/2016   Fibromyalgia 01/29/2016   Hiatal hernia with GERD 01/29/2016   History of bleeding peptic ulcer 10/30/2017   History of pulmonary embolism 01/29/2016   Overview:  x2   Hyperlipidemia    Hypertension 01/29/2016   Hypokalemia    Normocytic anemia 10/30/2017   O2 dependent    Occlusion of right femoral-popliteal bypass graft ([place]) 10/30/2017   PONV (postoperative nausea and vomiting)    Pulmonary embolism ([place]) 09/13/2020   Formatting of this note might be different from the original. x2   Septic arthritis of knee, right ([place]) 10/30/2017   Stroke ([place]) 01/29/2016   Overview:  Date of stroke is unknown.  "cerebellum"   Tobacco abuse    Urgency incontinence 01/29/2016    Past Surgical History:  Procedure Laterality Date   ABDOMINAL AORTOGRAM W/LOWER EXTREMITY Right 11/01/2017   Ultrasound-guided cannulation left common femoral artery   ABDOMINAL AORTOGRAM W/LOWER EXTREMITY N/A 11/01/2017   Procedure: ABDOMINAL AORTOGRAM W/LOWER EXTREMITY;  Surgeon: Waynetta Sandy, MD;  Location: [facility];  Service: Cardiovascular;  Laterality: N/A;   ABDOMINAL HYSTERECTOMY     FOOT SURGERY Bilateral    HEMORROIDECTOMY     LUNG LOBECTOMY  02/23/2006   DR.BURNEY   REVISION TOTAL HIP ARTHROPLASTY     TEE WITHOUT CARDIOVERSION N/A 11/10/2017   Procedure: TRANSESOPHAGEAL  ECHOCARDIOGRAM (TEE);  Surgeon: Acie Fredrickson Wonda Cheng, MD;  Location: [facility];  Service: Cardiovascular;  Laterality: N/A;   THORACOTOMY  02/23/2006   DR.BURNEY   TIBIA FRACTURE SURGERY      Current Medications: Current Meds  Medication Sig   albuterol (VENTOLIN HFA) 108 (90 Base) MCG/ACT inhaler Inhale 2 puffs into the lungs every 6 (six) hours as needed for wheezing or shortness of breath.   apixaban (ELIQUIS) 5 MG TABS tablet Take 1 tablet (5 mg total) by mouth 2 (two) times daily.   atorvastatin (LIPITOR) 20 MG tablet Take 20 mg by mouth at bedtime.   busPIRone (BUSPAR) 7.5 MG tablet Take 7.5 mg by mouth 2 (two) times daily.   Calcium Carbonate (CALCIUM 500 PO) Take 500 mg by mouth daily.   clopidogrel (PLAVIX) 75 MG tablet Take 75 mg by mouth daily.   Cyanocobalamin (B-12 PO) Take 1 capsule by mouth daily.   dicyclomine (BENTYL) 20 MG tablet Take 20 mg by  mouth 3 (three) times daily as needed for spasms.    digoxin (LANOXIN) 0.125 MG tablet Take 1 tablet (0.125 mg total) by mouth daily.   diltiazem (CARDIZEM CD) 120 MG 24 hr capsule Take 120 mg by mouth daily.   diltiazem (CARDIZEM) 30 MG tablet Take 30 mg by mouth as needed (heart rate above 120).   fluticasone (FLONASE) 50 MCG/ACT nasal spray Place 1 spray into both nostrils 2 (two) times daily.   furosemide (LASIX) 40 MG tablet Take 40 mg by mouth daily.   gabapentin (NEURONTIN) 300 MG capsule Take 300 mg by mouth 2 (two) times daily.   ipratropium-albuterol (DUONEB) 0.5-2.5 (3) MG/3ML SOLN Take 3 mLs by nebulization 4 (four) times daily as needed (shortness of breath).   LORazepam (ATIVAN) 0.5 MG tablet Take 0.5 mg by mouth daily as needed for anxiety.   meclizine (ANTIVERT) 25 MG tablet Take 25 mg by mouth 2 (two) times daily as needed for dizziness.   metoprolol tartrate (LOPRESSOR) 50 MG tablet Take 75 mg by mouth every morning. And takes 50 mg in the evening   Multiple Vitamins-Minerals (ICAPS AREDS 2 PO) Take 2 tablets by  mouth daily.   ondansetron (ZOFRAN) 4 MG tablet Take 4 mg by mouth 4 (four) times daily as needed for nausea.   OXYCONTIN 15 MG 12 hr tablet Take 15 mg by mouth 3 (three) times daily.   pantoprazole (PROTONIX) 40 MG tablet Take 40 mg by mouth daily.    Potassium Chloride ER 20 MEQ TBCR Take 1 tablet by mouth daily.   sucralfate (CARAFATE) 1 g tablet Take 1 g by mouth 4 (four) times daily.     Allergies:   Ambien [zolpidem], Aspirin, Codeine, Erythromycin, Ibuprofen, Phenergan [promethazine], and Penicillins   Social History   Socioeconomic History   Marital status: Divorced    Spouse name: Not on file   Number of children: 1   Years of education: 10   Highest education level: Not on file  Occupational History   Occupation: N/A  Tobacco Use   Smoking status: Every Day    Packs/day: 0.50    Types: Cigarettes   Smokeless tobacco: Never   Tobacco comments:    smoking sinceshewas 16  Vaping Use   Vaping Use: Never used  Substance and Sexual Activity   Alcohol use: Yes    Comment: Couple of drinks per day   Drug use: No   Sexual activity: Not on file  Other Topics Concern   Not on file  Social History Narrative   Lives at home w/ her daughter   Right-handed   Caffeine: 1 cup of coffee   Social Determinants of Health   Financial Resource Strain: Not on file  Food Insecurity: No Food Insecurity   Worried About Charity fundraiser in the Last Year: Never true   [place] in the Last Year: Never true  Transportation Needs: No Transportation Needs   Lack of Transportation (Medical): No   Lack of Transportation (Non-Medical): No  Physical Activity: Not on file  Stress: Not on file  Social Connections: Not on file     Family History: The patient's family history includes Liver cancer in her mother; Lung cancer in her father.  ROS:   Please see the history of present illness.    All other systems reviewed and are negative.  EKGs/Labs/Other Studies Reviewed:     The following studies were reviewed today:  EKG reveals atrial fibrillation with elevated  ventricular rate. Findings: Her aorta and iliac segments are heavily calcified although there is no flow-limiting stenosis.  On the right side she has flush occlusion of her SFA reconstitutes and above-knee popliteal where she has runoff via posterior tibial is the dominant vessel.  On the left side there are no occlusions however she does have at least a couple 50% stenosis in the left SFA.  No intervention was undertaken.   Recent Labs: No results found for requested labs within last 8760 hours.  Recent Lipid Panel No results found for: CHOL, TRIG, HDL, CHOLHDL, VLDL, LDLCALC, LDLDIRECT  Physical Exam:    VS:  BP 120/76   Pulse (!) 129   Ht 5\' 3"  (1.6 m)   Wt 152 lb 6.4 oz (69.1 kg)   SpO2 98%   BMI 27.00 kg/m     Wt Readings from Last 3 Encounters:  12/02/20 152 lb 6.4 oz (69.1 kg)  09/03/20 161 lb (73 kg)  04/27/18 151 lb (68.5 kg)     GEN: Patient is in no acute distress HEENT: Normal NECK: No JVD; No carotid bruits LYMPHATICS: No lymphadenopathy CARDIAC: S1 S2 regular, 2/6 systolic murmur at the apex. RESPIRATORY:  Clear to auscultation without rales, wheezing or rhonchi  ABDOMEN: Soft, non-tender, non-distended MUSCULOSKELETAL:  No edema; No deformity  SKIN: Warm and dry NEUROLOGIC:  Alert and oriented x 3 PSYCHIATRIC:  Normal affect    Signed, Jenean Lindau, MD  12/02/2020 5:09 PM    [place]

## 2020-12-02 NOTE — Patient Instructions (Signed)
Medication Instructions:  Your physician has recommended you make the following change in your medication:  START: Digoxin 0.125 mg take one tablet by mouth daily.  START: Eliquis 5 mg take one tablet by mouth twice daily.   *If you need a refill on your cardiac medications before your next appointment, please call your pharmacy*   Lab Work: Your physician recommends that you return for lab work in: 1 WEEK Stool test If you have labs (blood work) drawn today and your tests are completely normal, you will receive your results only by: [place] (if you have MyChart) OR A paper copy in the mail If you have any lab test that is abnormal or we need to change your treatment, we will call you to review the results.   Testing/Procedures: We have given you the order form to have a chest x-ray completed. You can go to [facility] to have this done at any time.    Follow-Up: At [facility], you and your health needs are our priority.  As part of our continuing mission to provide you with exceptional heart care, we have created designated Provider Care Teams.  These Care Teams include your primary Cardiologist (physician) and Advanced Practice Providers (APPs -  Physician Assistants and Nurse Practitioners) who all work together to provide you with the care you need, when you need it.  We recommend signing up for the patient portal called "MyChart".  Sign up information is provided on this After Visit Summary.  MyChart is used to connect with patients for Virtual Visits (Telemedicine).  Patients are able to view lab/test results, encounter notes, upcoming appointments, etc.  Non-urgent messages can be sent to your provider as well.   To learn more about what you can do with MyChart, go to [email or web address].    Your next appointment:   4 week(s)  The format for your next appointment:   In Person  Provider:   Jyl Heinz, MD   Other Instructions

## 2020-12-02 NOTE — Patient Outreach (Signed)
[facility]) Care Management  12/02/2020  Courtney Grant 11-Apr-1932 [id number]  Telephone Assessment-Successful.COPD  RN spoke with pt and her daughter concerning any issues and updated plan of care. Pt remains on track with no acute issues related to her COPD. Pending CAD appointment this month however continues to be independent with her ADLs.   Will encourage only adherence with plan of care and follow up accordingly over the next month.   Goals Addressed             This Visit's Progress    THN-Mat[place]ain My Quality of Life   On track    Timeframe:  Long-Range Goal Priority:  Medium Start Date:   11/01/2020                          Expected End Date:  02/12/2021                     Follow Up Date 12/30/2020  Barriers: Health Behaviors      - complete a living will - do one enjoyable thing every day - make shared treatment decisions with doctor - spend time with a child every day, borrow one if I have to    Why is this important?   Having a long-term illness can be scary.  It can also be stressful for you and your caregiver.  These steps may help.    Notes:  8/19-Discuss with caregiver pt's routine and daily quality of life. Pt has a good support system with live in daughter and son-in-law who provides pt with transportation when needed.     Track and Manage My Symptoms-COPD   On track    Timeframe:  Short-Term Goal Priority:  Medium Start Date: 11/01/2020                            Expected End Date:   01/13/2021                    Follow Up Date 12/30/2020    - begin a symptom diary - bring symptom diary to all visits - develop a rescue plan - eliminate symptom triggers at home - follow rescue plan if symptoms flare-up - keep follow-up appointments Barriers: Health Behaviors Knowledge     Why is this important?   Tracking your symptoms and other information about your health helps your doctor plan your care.  Write down the symptoms,  the time of day, what you were doing and what medicine you are taking.  You will soon learn how to manage your symptoms.     Notes:  9/19-Spoke with daughter [name] reports pt is doing well with no acute issues.  8/19-Verified pt remains in the GREEN zone and continues to have needed resources to assist with any flare up or encountered symptoms.           [name], RN Care Management Coordinator [place] Office [phone]

## 2020-12-03 LAB — CBC
Hematocrit: 29.9 % — ABNORMAL LOW (ref 34.0–46.6)
Hemoglobin: 9.3 g/dL — ABNORMAL LOW (ref 11.1–15.9)
MCH: 25 pg — ABNORMAL LOW (ref 26.6–33.0)
MCHC: 31.1 g/dL — ABNORMAL LOW (ref 31.5–35.7)
MCV: 80 fL (ref 79–97)
Platelets: 268 10*3/uL (ref 150–450)
RBC: 3.72 x10E6/uL — ABNORMAL LOW (ref 3.77–5.28)
RDW: 14.4 % (ref 11.7–15.4)
WBC: 10.8 10*3/uL (ref 3.4–10.8)

## 2020-12-03 LAB — BASIC METABOLIC PANEL
BUN/Creatinine Ratio: 17 (ref 12–28)
BUN: 18 mg/dL (ref 8–27)
CO2: 26 mmol/L (ref 20–29)
Calcium: 9.3 mg/dL (ref 8.7–10.3)
Chloride: 93 mmol/L — ABNORMAL LOW (ref 96–106)
Creatinine, Ser: 1.06 mg/dL — ABNORMAL HIGH (ref 0.57–1.00)
Glucose: 108 mg/dL — ABNORMAL HIGH (ref 65–99)
Potassium: 4.6 mmol/L (ref 3.5–5.2)
Sodium: 137 mmol/L (ref 134–144)
eGFR: 51 mL/min/{1.73_m2} — ABNORMAL LOW (ref >59)

## 2020-12-09 DIAGNOSIS — R7303 Prediabetes: Secondary | ICD-10-CM | POA: Diagnosis not present

## 2020-12-09 DIAGNOSIS — J984 Other disorders of lung: Secondary | ICD-10-CM | POA: Diagnosis not present

## 2020-12-09 DIAGNOSIS — R0600 Dyspnea, unspecified: Secondary | ICD-10-CM | POA: Diagnosis not present

## 2020-12-09 DIAGNOSIS — I10 Essential (primary) hypertension: Secondary | ICD-10-CM | POA: Diagnosis not present

## 2020-12-09 DIAGNOSIS — J90 Pleural effusion, not elsewhere classified: Secondary | ICD-10-CM | POA: Diagnosis not present

## 2020-12-09 DIAGNOSIS — E782 Mixed hyperlipidemia: Secondary | ICD-10-CM | POA: Diagnosis not present

## 2020-12-09 DIAGNOSIS — R0602 Shortness of breath: Secondary | ICD-10-CM | POA: Diagnosis not present

## 2020-12-09 DIAGNOSIS — D649 Anemia, unspecified: Secondary | ICD-10-CM | POA: Diagnosis not present

## 2020-12-09 DIAGNOSIS — Z23 Encounter for immunization: Secondary | ICD-10-CM | POA: Diagnosis not present

## 2020-12-18 ENCOUNTER — Telehealth: Payer: Self-pay | Admitting: Cardiology

## 2020-12-18 NOTE — Telephone Encounter (Signed)
pts daughter on the line reaching back out in regards to lab results and has additional questions... please advise

## 2020-12-18 NOTE — Telephone Encounter (Signed)
Spoke to the patients daughter just now and she let me know that she contacted the patients PCP and has not gotten a real answer from them in regards to this. She states she has called them several times but has not gotten a definite answer from them. I told her that she needs to get this addressed with them and she states that she will do so.

## 2020-12-19 ENCOUNTER — Telehealth: Payer: Self-pay

## 2020-12-19 DIAGNOSIS — J449 Chronic obstructive pulmonary disease, unspecified: Secondary | ICD-10-CM | POA: Diagnosis not present

## 2020-12-20 NOTE — Telephone Encounter (Signed)
Left a detailed message that pt's chest xray was stable, she has some scarring but no changes.

## 2020-12-30 ENCOUNTER — Other Ambulatory Visit: Payer: Self-pay | Admitting: *Deleted

## 2020-12-30 NOTE — Patient Outreach (Signed)
[facility]) Care Management  12/30/2020  Courtney Grant [birth year] [id number]  TELEPHONE ASSESSMENT-SUCCESSFUL-COPD  RN spoke with pt and received an update on her ongoing plan of care.  All information reviewed and discussed accordingly with noted updates. Pt continue to use her home O2 and medication accordingly as prescribed. Pt continues to attends all medial appointments with use of her ongoing support system however today [t having issues with her GI indicating this is a history and she has taken her medication for this illness but no feeling to go today. RN offered to contact her provider however pt states no need as she is aware of what to do and currently it is tolerable however pt would like for me to call her daughter Courtney Grant) and provider my contact for future needs. RN contacted Courtney Grant and provided my contact number if pt develops sny needs for [facility] to address.  Offered to follow up in a few weeks with pt's ongoing progress concerning her ongoing management of care (receptive). Will scheduled the appointment and follow through.  Goals Addressed             This Visit's Progress    THN-Matintain My Quality of Life   On track    Timeframe:  Long-Range Goal Priority:  Medium Start Date:   11/01/2020                          Expected End Date:  02/12/2021                     Follow Up Date 01/29/2021  Barriers: Health Behaviors      - complete a living will - do one enjoyable thing every day - make shared treatment decisions with doctor - spend time with a child every day, borrow one if I have to    Why is this important?   Having a long-term illness can be scary.  It can also be stressful for you and your caregiver.  These steps may help.    Notes:  8/19-Discuss with caregiver pt's routine and daily quality of life. Pt has a good support system with live in daughter and son-in-law who provides pt with transportation when needed.     THN-Track  and Manage My Symptoms-COPD   On track    Timeframe:  Short-Term Goal Priority:  Medium Start Date: 11/01/2020                            Expected End Date:   02/12/2021                    Follow Up Date 11/162022    - begin a symptom diary - bring symptom diary to all visits - develop a rescue plan - eliminate symptom triggers at home - follow rescue plan if symptoms flare-up - keep follow-up appointments Barriers: Health Behaviors Knowledge     Why is this important?   Tracking your symptoms and other information about your health helps your doctor plan your care.  Write down the symptoms, the time of day, what you were doing and what medicine you are taking.  You will soon learn how to manage your symptoms.     Notes:  10/17-Pt indicates she continue to breath "okay" with no acute issues using her ongoing home O2. Remains in the GREEN zone. Denies any flare up with  ongoing medication usage.  9/19-Spoke with daughter Courtney Grant reports pt is doing well with no acute issues.  8/19-Verified pt remains in the GREEN zone and continues to have needed resources to assist with any flare up or encountered symptoms.           Courtney Mina, RN Care Management Coordinator [place] Office [phone]

## 2021-01-06 DIAGNOSIS — I10 Essential (primary) hypertension: Secondary | ICD-10-CM | POA: Diagnosis not present

## 2021-01-06 DIAGNOSIS — E782 Mixed hyperlipidemia: Secondary | ICD-10-CM | POA: Diagnosis not present

## 2021-01-06 DIAGNOSIS — R7303 Prediabetes: Secondary | ICD-10-CM | POA: Diagnosis not present

## 2021-01-06 DIAGNOSIS — D649 Anemia, unspecified: Secondary | ICD-10-CM | POA: Diagnosis not present

## 2021-01-06 DIAGNOSIS — D519 Vitamin B12 deficiency anemia, unspecified: Secondary | ICD-10-CM | POA: Diagnosis not present

## 2021-01-10 ENCOUNTER — Other Ambulatory Visit: Payer: Self-pay

## 2021-01-10 ENCOUNTER — Encounter: Payer: Self-pay | Admitting: Cardiology

## 2021-01-10 ENCOUNTER — Ambulatory Visit (INDEPENDENT_AMBULATORY_CARE_PROVIDER_SITE_OTHER): Payer: Medicare Other | Admitting: Cardiology

## 2021-01-10 VITALS — BP 138/50 | HR 68 | Ht 63.0 in | Wt 141.4 lb

## 2021-01-10 DIAGNOSIS — J431 Panlobular emphysema: Secondary | ICD-10-CM | POA: Diagnosis not present

## 2021-01-10 DIAGNOSIS — E782 Mixed hyperlipidemia: Secondary | ICD-10-CM | POA: Diagnosis not present

## 2021-01-10 DIAGNOSIS — I4891 Unspecified atrial fibrillation: Secondary | ICD-10-CM | POA: Diagnosis not present

## 2021-01-10 DIAGNOSIS — Z720 Tobacco use: Secondary | ICD-10-CM | POA: Diagnosis not present

## 2021-01-10 NOTE — Patient Instructions (Addendum)
Medication Instructions:  Your physician has recommended you make the following change in your medication:   Stop digoxin.  *If you need a refill on your cardiac medications before your next appointment, please call your pharmacy*   Lab Work: None ordered If you have labs (blood work) drawn today and your tests are completely normal, you will receive your results only by: [place] (if you have MyChart) OR A paper copy in the mail If you have any lab test that is abnormal or we need to change your treatment, we will call you to review the results.   Testing/Procedures: None ordered   Follow-Up: At [facility], you and your health needs are our priority.  As part of our continuing mission to provide you with exceptional heart care, we have created designated Provider Care Teams.  These Care Teams include your primary Cardiologist (physician) and Advanced Practice Providers (APPs -  Physician Assistants and Nurse Practitioners) who all work together to provide you with the care you need, when you need it.  We recommend signing up for the patient portal called "MyChart".  Sign up information is provided on this After Visit Summary.  MyChart is used to connect with patients for Virtual Visits (Telemedicine).  Patients are able to view lab/test results, encounter notes, upcoming appointments, etc.  Non-urgent messages can be sent to your provider as well.   To learn more about what you can do with MyChart, go to [email or web address].    Your next appointment:   1 month(s)  The format for your next appointment:   In Person  Provider:   Jyl Heinz, MD   Other Instructions NA

## 2021-01-10 NOTE — Progress Notes (Signed)
Cardiology Office Note:    Date:  01/10/2021   ID:  Courtney Grant, DOB [birth year], MRN [id number]  PCP:  Garwin Brothers, MD  Cardiologist:  Jenean Lindau, MD   Referring MD: Garwin Brothers, MD    ASSESSMENT:    1. Mixed hyperlipidemia   2. Atrial fibrillation, unspecified type ([place])   3. Tobacco abuse   4. Panlobular emphysema ([place])    PLAN:    In order of problems listed above:  Atherosclerotic vascular disease: Secondary prevention stressed.  Importance of compliance with diet medication stressed. Cigarette smoker: Counseling advised again and she promises to do better. Atrial fibrillation: Persistent: She has issues with nausea and I would like to stop her digoxin.  Her nausea is not severe.  Is only occasional and sporadic but the daughter mentions to me that it is caused an issue with her with her diet.  She is not eating much.  She has been losing weight over the past several months and they are concerned about it so we will stop her digoxin.  I told her that her symptoms if from digoxin she would improve over the next 3 to 4 days or after the weekend.  If they do not get better she needs to address this with her primary care. COPD on oxygen managed by primary care.  Chest x-ray was done and no pleural effusion was noted and I discussed this finding with the patient.   Medication Adjustments/Labs and Tests Ordered: Current medicines are reviewed at length with the patient today.  Concerns regarding medicines are outlined above.  No orders of the defined types were placed in this encounter.  No orders of the defined types were placed in this encounter.    No chief complaint on file.    History of Present Illness:    Courtney Grant is a 87 y.o. female.  Patient has past medical history of atherosclerotic vascular disease, persistent atrial fibrillation, cigarette smoking and advanced COPD.  Her daughter brings in on wheelchair.  Patient mentions to me that she has issues  with nausea.  No vomiting or any such issues.  No changes in mental orientation.  Patient is able to eat but not much.  Past Medical History:  Diagnosis Date   Abnormal transaminases    Arthritis 01/29/2016   Overview:  Generalized   Atherosclerosis of native artery of both lower extremities with intermittent claudication ([place]) 10/13/2016   Formatting of this note might be different from the original. Added automatically from request for surgery [phone]   Atrial fibrillation ([place])    Bacteremia due to methicillin susceptible Staphylococcus aureus (MSSA) 10/30/2017   Barrett's esophagus    Benign paroxysmal positional vertigo 10/25/2015   Biliary dyskinesia 01/29/2016   Cancer of lung ([place])    ADENOCARINOMA RUL  Overview:  Overview:  ADENOCARINOMA RUL   Chronic back pain 10/30/2017   Chronic CHF (congestive heart failure) (HCC)    Complication of anesthesia    COPD (chronic obstructive pulmonary disease) ([place])    Depression 01/29/2016   Fibromyalgia 01/29/2016   Hiatal hernia with GERD 01/29/2016   History of bleeding peptic ulcer 10/30/2017   History of pulmonary embolism 01/29/2016   Overview:  x2   Hyperlipidemia    Hypertension 01/29/2016   Hypokalemia    Normocytic anemia 10/30/2017   O2 dependent    Occlusion of right femoral-popliteal bypass graft ([place]) 10/30/2017   PONV (postoperative nausea and vomiting)    Pulmonary embolism ([place]) 09/13/2020  Formatting of this note might be different from the original. x2   Septic arthritis of knee, right ([place]) 10/30/2017   Stroke ([place]) 01/29/2016   Overview:  Date of stroke is unknown.  "cerebellum"   Tobacco abuse    Urgency incontinence 01/29/2016    Past Surgical History:  Procedure Laterality Date   ABDOMINAL AORTOGRAM W/LOWER EXTREMITY Right 11/01/2017   Ultrasound-guided cannulation left common femoral artery   ABDOMINAL AORTOGRAM W/LOWER EXTREMITY N/A 11/01/2017   Procedure: ABDOMINAL AORTOGRAM W/LOWER EXTREMITY;   Surgeon: Waynetta Sandy, MD;  Location: [facility];  Service: Cardiovascular;  Laterality: N/A;   ABDOMINAL HYSTERECTOMY     FOOT SURGERY Bilateral    HEMORROIDECTOMY     LUNG LOBECTOMY  02/23/2006   DR.BURNEY   REVISION TOTAL HIP ARTHROPLASTY     TEE WITHOUT CARDIOVERSION N/A 11/10/2017   Procedure: TRANSESOPHAGEAL ECHOCARDIOGRAM (TEE);  Surgeon: Acie Fredrickson Wonda Cheng, MD;  Location: [facility];  Service: Cardiovascular;  Laterality: N/A;   THORACOTOMY  02/23/2006   DR.BURNEY   TIBIA FRACTURE SURGERY      Current Medications: Current Meds  Medication Sig   albuterol (VENTOLIN HFA) 108 (90 Base) MCG/ACT inhaler Inhale 2 puffs into the lungs every 6 (six) hours as needed for wheezing or shortness of breath.   apixaban (ELIQUIS) 5 MG TABS tablet Take 1 tablet (5 mg total) by mouth 2 (two) times daily.   atorvastatin (LIPITOR) 20 MG tablet Take 20 mg by mouth at bedtime.   busPIRone (BUSPAR) 7.5 MG tablet Take 7.5 mg by mouth 2 (two) times daily.   Calcium Carbonate (CALCIUM 500 PO) Take 500 mg by mouth daily.   clopidogrel (PLAVIX) 75 MG tablet Take 75 mg by mouth daily.   Cyanocobalamin (B-12 PO) Take 1 capsule by mouth daily.   dicyclomine (BENTYL) 20 MG tablet Take 20 mg by mouth 3 (three) times daily as needed for spasms.    digoxin (LANOXIN) 0.125 MG tablet Take 1 tablet (0.125 mg total) by mouth daily.   diltiazem (CARDIZEM CD) 120 MG 24 hr capsule Take 120 mg by mouth daily.   diltiazem (CARDIZEM) 30 MG tablet Take 30 mg by mouth as needed (heart rate above 120).   Ferrous Sulfate (IRON PO) Take 1 tablet by mouth daily.   fluticasone (FLONASE) 50 MCG/ACT nasal spray Place 1 spray into both nostrils 2 (two) times daily.   furosemide (LASIX) 40 MG tablet Take 40 mg by mouth daily.   gabapentin (NEURONTIN) 300 MG capsule Take 300 mg by mouth 2 (two) times daily.   ipratropium-albuterol (DUONEB) 0.5-2.5 (3) MG/3ML SOLN Take 3 mLs by nebulization 4 (four) times daily  as needed (shortness of breath).   LORazepam (ATIVAN) 0.5 MG tablet Take 0.5 mg by mouth daily as needed for anxiety.   meclizine (ANTIVERT) 25 MG tablet Take 25 mg by mouth 2 (two) times daily as needed for dizziness.   metoprolol tartrate (LOPRESSOR) 50 MG tablet Take 75 mg by mouth every morning. And takes 50 mg in the evening   Multiple Vitamins-Minerals (ICAPS AREDS 2 PO) Take 2 tablets by mouth daily.   ondansetron (ZOFRAN) 4 MG tablet Take 4 mg by mouth 4 (four) times daily as needed for nausea.   OXYCONTIN 15 MG 12 hr tablet Take 15 mg by mouth 3 (three) times daily.   pantoprazole (PROTONIX) 40 MG tablet Take 40 mg by mouth daily.    Potassium Chloride ER 20 MEQ TBCR Take 1 tablet by mouth daily.  sucralfate (CARAFATE) 1 g tablet Take 1 g by mouth 4 (four) times daily.     Allergies:   Ambien [zolpidem], Aspirin, Codeine, Erythromycin, Ibuprofen, Phenergan [promethazine], and Penicillins   Social History   Socioeconomic History   Marital status: Divorced    Spouse name: Not on file   Number of children: 1   Years of education: 10   Highest education level: Not on file  Occupational History   Occupation: N/A  Tobacco Use   Smoking status: Every Day    Packs/day: 0.50    Types: Cigarettes   Smokeless tobacco: Never   Tobacco comments:    smoking sinceshewas 16  Vaping Use   Vaping Use: Never used  Substance and Sexual Activity   Alcohol use: Yes    Comment: Couple of drinks per day   Drug use: No   Sexual activity: Not on file  Other Topics Concern   Not on file  Social History Narrative   Lives at home w/ her daughter   Right-handed   Caffeine: 1 cup of coffee   Social Determinants of Health   Financial Resource Strain: Not on file  Food Insecurity: No Food Insecurity   Worried About Charity fundraiser in the Last Year: Never true   [place] in the Last Year: Never true  Transportation Needs: No Transportation Needs   Lack of Transportation  (Medical): No   Lack of Transportation (Non-Medical): No  Physical Activity: Not on file  Stress: Not on file  Social Connections: Not on file     Family History: The patient's family history includes Liver cancer in her mother; Lung cancer in her father.  ROS:   Please see the history of present illness.    All other systems reviewed and are negative.  EKGs/Labs/Other Studies Reviewed:    The following studies were reviewed today: I discussed my findings with the patient at length.   Recent Labs: 12/02/2020: BUN 18; Creatinine, Ser 1.06; Hemoglobin 9.3; Platelets 268; Potassium 4.6; Sodium 137  Recent Lipid Panel No results found for: CHOL, TRIG, HDL, CHOLHDL, VLDL, LDLCALC, LDLDIRECT  Physical Exam:    VS:  BP (!) 138/50   Pulse 68   Ht 5\' 3"  (1.6 m)   Wt 141 lb 6.4 oz (64.1 kg)   SpO2 99%   BMI 25.05 kg/m     Wt Readings from Last 3 Encounters:  01/10/21 141 lb 6.4 oz (64.1 kg)  12/02/20 152 lb 6.4 oz (69.1 kg)  09/03/20 161 lb (73 kg)     GEN: Patient is in no acute distress HEENT: Normal NECK: No JVD; No carotid bruits LYMPHATICS: No lymphadenopathy CARDIAC: Hear sounds regular, 2/6 systolic murmur at the apex. RESPIRATORY:  Clear to auscultation without rales, wheezing or rhonchi  ABDOMEN: Soft, non-tender, non-distended MUSCULOSKELETAL:  No edema; No deformity  SKIN: Warm and dry NEUROLOGIC:  Alert and oriented x 3 PSYCHIATRIC:  Normal affect   Signed, Jenean Lindau, MD  01/10/2021 3:25 PM    [facility]

## 2021-01-19 DIAGNOSIS — J449 Chronic obstructive pulmonary disease, unspecified: Secondary | ICD-10-CM | POA: Diagnosis not present

## 2021-01-22 DIAGNOSIS — G8929 Other chronic pain: Secondary | ICD-10-CM | POA: Diagnosis not present

## 2021-01-22 DIAGNOSIS — M79661 Pain in right lower leg: Secondary | ICD-10-CM | POA: Diagnosis not present

## 2021-01-22 DIAGNOSIS — G894 Chronic pain syndrome: Secondary | ICD-10-CM | POA: Diagnosis not present

## 2021-01-22 DIAGNOSIS — M169 Osteoarthritis of hip, unspecified: Secondary | ICD-10-CM | POA: Diagnosis not present

## 2021-01-22 DIAGNOSIS — Z72823 Risk of suffocation (smothering) under another while sleeping: Secondary | ICD-10-CM | POA: Diagnosis not present

## 2021-01-22 DIAGNOSIS — M542 Cervicalgia: Secondary | ICD-10-CM | POA: Diagnosis not present

## 2021-01-22 DIAGNOSIS — G8918 Other acute postprocedural pain: Secondary | ICD-10-CM | POA: Diagnosis not present

## 2021-01-22 DIAGNOSIS — M545 Low back pain, unspecified: Secondary | ICD-10-CM | POA: Diagnosis not present

## 2021-01-22 DIAGNOSIS — J439 Emphysema, unspecified: Secondary | ICD-10-CM | POA: Diagnosis not present

## 2021-01-22 DIAGNOSIS — M79669 Pain in unspecified lower leg: Secondary | ICD-10-CM | POA: Diagnosis not present

## 2021-01-29 ENCOUNTER — Other Ambulatory Visit: Payer: Self-pay | Admitting: *Deleted

## 2021-01-29 NOTE — Patient Outreach (Signed)
[facility]) Care Management  01/29/2021  Courtney Grant 04-Jan-1933 [id number]   Telephone assessment-Successful-COPD  RN spoke with the pt's daughter Courtney Grant) and review/discussed the pt's plan of care along with all interventions and goals. Updates noted within the plan of care. No further needs to address since the last call as pt continue to smoke using precautions with her ongoing home O2. Request made for help in the home however decline resources from care-guides. No other issues presented at this time.   Will follow up next month with ongoing case management services and available resources.    Goals Addressed             This Visit's Progress    THN-[place] My Quality of Life   On track    Timeframe:  Long-Range Goal Priority:  Medium Start Date:   11/01/2020                          Expected End Date:  05/13/2021                     Follow Up Date 02/24/2021  Barriers: Health Behaviors      - complete a living will - do one enjoyable thing every day - make shared treatment decisions with doctor - spend time with a child every day, borrow one if I have to    Why is this important?   Having a long-term illness can be scary.  It can also be stressful for you and your caregiver.  These steps may help.    Notes:  11/16-Verified pt's quality of life surrounds her smoking on the back porch noting wanting to cease smoking at this time. Will continue to offer smoking cessation program if pt is willing to participate.Will follow up once another on this topic next follow up. 8/19-Discuss with caregiver pt's routine and daily quality of life. Pt has a good support system with live in daughter and son-in-law who provides pt with transportation when needed.     THN-Track and Manage My Symptoms-COPD   On track    Timeframe:  Short-Term Goal Priority:  Medium Start Date: 11/01/2020                            Expected End Date:   03/14/2021                     Follow Up Date 02/24/2021    - begin a symptom diary - bring symptom diary to all visits - develop a rescue plan - eliminate symptom triggers at home - follow rescue plan if symptoms flare-up - keep follow-up appointments Barriers: Health Behaviors Knowledge     Why is this important?   Tracking your symptoms and other information about your health helps your doctor plan your care.  Write down the symptoms, the time of day, what you were doing and what medicine you are taking.  You will soon learn how to manage your symptoms.     Notes:  11/16- Caregiver daughter denies any acute events for this pt with no needs presented at this time to address. States pt may need some help in the home but not willing to pay out of pocket expense and declined information related to or or the next level of care.  Pt remains in the GREEN zone however continue to smoke and use home  O2 on 2 1/2 liters. Aware not to smoke when smoking and pt does not wish to stop at this time.Will verified use of inhalers (has not been filled). Strongly encouraged renewal of pt's inhaler to prevent acute exacerbations and control her wheezing and SOB. 10/17-Pt indicates she continue to breath "okay" with no acute issues using her ongoing home O2. Remains in the GREEN zone. Denies any flare up with ongoing medication usage.  9/19-Spoke with daughter Courtney Grant reports pt is doing well with no acute issues.  8/19-Verified pt remains in the GREEN zone and continues to have needed resources to assist with any flare up or encountered symptoms.           Courtney Mina, RN Care Management Coordinator [place] Office [phone]

## 2021-02-11 ENCOUNTER — Ambulatory Visit (INDEPENDENT_AMBULATORY_CARE_PROVIDER_SITE_OTHER): Payer: Medicare Other | Admitting: Cardiology

## 2021-02-11 ENCOUNTER — Encounter: Payer: Self-pay | Admitting: Cardiology

## 2021-02-11 ENCOUNTER — Ambulatory Visit (INDEPENDENT_AMBULATORY_CARE_PROVIDER_SITE_OTHER): Payer: Medicare Other

## 2021-02-11 ENCOUNTER — Other Ambulatory Visit: Payer: Self-pay

## 2021-02-11 VITALS — BP 134/70 | HR 118 | Ht 64.0 in | Wt 145.8 lb

## 2021-02-11 DIAGNOSIS — Z720 Tobacco use: Secondary | ICD-10-CM

## 2021-02-11 DIAGNOSIS — I4891 Unspecified atrial fibrillation: Secondary | ICD-10-CM

## 2021-02-11 DIAGNOSIS — E782 Mixed hyperlipidemia: Secondary | ICD-10-CM

## 2021-02-11 DIAGNOSIS — I10 Essential (primary) hypertension: Secondary | ICD-10-CM

## 2021-02-11 NOTE — Patient Instructions (Signed)
Medication Instructions:  No medication changes. *If you need a refill on your cardiac medications before your next appointment, please call your pharmacy*   Lab Work: None ordered If you have labs (blood work) drawn today and your tests are completely normal, you will receive your results only by: [place] (if you have MyChart) OR A paper copy in the mail If you have any lab test that is abnormal or we need to change your treatment, we will call you to review the results.   Testing/Procedures:  WHY IS MY DOCTOR PRESCRIBING ZIO? The Zio system is proven and trusted by physicians to detect and diagnose irregular heart rhythms -- and has been prescribed to hundreds of thousands of patients.  The FDA has cleared the Zio system to monitor for many different kinds of irregular heart rhythms. In a study, physicians were able to reach a diagnosis 90% of the time with the Zio system1.  You can wear the Zio monitor -- a small, discreet, comfortable patch -- during your normal day-to-day activity, including while you sleep, shower, and exercise, while it records every single heartbeat for analysis.  1Barrett, P., et al. Comparison of 24 Hour Holter Monitoring Versus 14 Day Novel Adhesive Patch Electrocardiographic Monitoring. [place], 2014.  ZIO VS. HOLTER MONITORING The Zio monitor can be comfortably worn for up to 14 days. Holter monitors can be worn for 24 to 48 hours, limiting the time to record any irregular heart rhythms you may have. Zio is able to capture data for the 51% of patients who have their first symptom-triggered arrhythmia after 48 hours.1  LIVE WITHOUT RESTRICTIONS The Zio ambulatory cardiac monitor is a small, unobtrusive, and water-resistant patch--you might even forget you're wearing it. The Zio monitor records and stores every beat of your heart, whether you're sleeping, working out, or showering. Wear the monitor for 14 days, remove  02/25/21   Follow-Up: At [facility], you and your health needs are our priority.  As part of our continuing mission to provide you with exceptional heart care, we have created designated Provider Care Teams.  These Care Teams include your primary Cardiologist (physician) and Advanced Practice Providers (APPs -  Physician Assistants and Nurse Practitioners) who all work together to provide you with the care you need, when you need it.  We recommend signing up for the patient portal called "MyChart".  Sign up information is provided on this After Visit Summary.  MyChart is used to connect with patients for Virtual Visits (Telemedicine).  Patients are able to view lab/test results, encounter notes, upcoming appointments, etc.  Non-urgent messages can be sent to your provider as well.   To learn more about what you can do with MyChart, go to [email or web address].    Your next appointment:   2 month(s)  The format for your next appointment:   In Person  Provider:   Jyl Heinz, MD   Other Instructions NA

## 2021-02-11 NOTE — Progress Notes (Signed)
Cardiology Office Note:    Date:  02/11/2021   ID:  Courtney Grant, DOB Apr 24, 1932, MRN [id number]  PCP:  Courtney Brothers, MD  Cardiologist:  Courtney Lindau, MD   Referring MD: Courtney Brothers, MD    ASSESSMENT:    1. Atrial fibrillation, unspecified type ([place])   2. Mixed hyperlipidemia   3. Primary hypertension   4. Tobacco abuse    PLAN:    In order of problems listed above:  Atherosclerotic vascular disease: Secondary prevention stressed to the patient.  Importance of compliance with diet medication stressed and she vocalized understanding. Atrial fibrillation: Persistent: Heart rates are elevated.  I will do a 2-week monitor to understand her baseline heart rate and intervene as necessary.  Discussed this with her at length and she understands.  She has quit digoxin because of nausea and such issues and they have resolved completely now. COPD on oxygen: Managed by primary care.  She is feeling stable with this condition.  She uses a cigar at times and I counseled her against doing this. Patient will be seen in follow-up appointment in 2 months or earlier if the patient has any concerns    Medication Adjustments/Labs and Tests Ordered: Current medicines are reviewed at length with the patient today.  Concerns regarding medicines are outlined above.  Orders Placed This Encounter  Procedures   LONG TERM MONITOR (3-14 DAYS)   No orders of the defined types were placed in this encounter.    No chief complaint on file.    History of Present Illness:    Courtney Grant is a 87 y.o. female.  Patient has past medical history of atrial fibrillation, mixed dyslipidemia and tobacco abuse.  She has history of COPD.  She denies any problems at this time and takes care of activities of daily living.  No chest pain orthopnea or PND.  At the time of my evaluation, the patient is alert awake oriented and in no distress.  Past Medical History:  Diagnosis Date   Abnormal transaminases     Arthritis 01/29/2016   Overview:  Generalized   Atherosclerosis of native artery of both lower extremities with intermittent claudication ([place]) 10/13/2016   Formatting of this note might be different from the original. Added automatically from request for surgery [phone]   Atrial fibrillation ([place])    Bacteremia due to methicillin susceptible Staphylococcus aureus (MSSA) 10/30/2017   Barrett's esophagus    Benign paroxysmal positional vertigo 10/25/2015   Biliary dyskinesia 01/29/2016   Cancer of lung ([place])    ADENOCARINOMA RUL  Overview:  Overview:  ADENOCARINOMA RUL   Chronic back pain 10/30/2017   Chronic CHF (congestive heart failure) (HCC)    Complication of anesthesia    COPD (chronic obstructive pulmonary disease) ([place])    Depression 01/29/2016   Fibromyalgia 01/29/2016   Hiatal hernia with GERD 01/29/2016   History of bleeding peptic ulcer 10/30/2017   History of pulmonary embolism 01/29/2016   Overview:  x2   Hyperlipidemia    Hypertension 01/29/2016   Hypokalemia    Normocytic anemia 10/30/2017   O2 dependent    Occlusion of right femoral-popliteal bypass graft ([place]) 10/30/2017   PONV (postoperative nausea and vomiting)    Pulmonary embolism ([place]) 09/13/2020   Formatting of this note might be different from the original. x2   Septic arthritis of knee, right ([place]) 10/30/2017   Stroke ([place]) 01/29/2016   Overview:  Date of stroke is unknown.  "cerebellum"   Tobacco abuse  Urgency incontinence 01/29/2016    Past Surgical History:  Procedure Laterality Date   ABDOMINAL AORTOGRAM W/LOWER EXTREMITY Right 11/01/2017   Ultrasound-guided cannulation left common femoral artery   ABDOMINAL AORTOGRAM W/LOWER EXTREMITY N/A 11/01/2017   Procedure: ABDOMINAL AORTOGRAM W/LOWER EXTREMITY;  Surgeon: Waynetta Sandy, MD;  Location: [facility];  Service: Cardiovascular;  Laterality: N/A;   ABDOMINAL HYSTERECTOMY     FOOT SURGERY Bilateral    HEMORROIDECTOMY      LUNG LOBECTOMY  02/23/2006   DR.BURNEY   REVISION TOTAL HIP ARTHROPLASTY     TEE WITHOUT CARDIOVERSION N/A 11/10/2017   Procedure: TRANSESOPHAGEAL ECHOCARDIOGRAM (TEE);  Surgeon: Acie Fredrickson Wonda Cheng, MD;  Location: [facility];  Service: Cardiovascular;  Laterality: N/A;   THORACOTOMY  02/23/2006   DR.BURNEY   TIBIA FRACTURE SURGERY      Current Medications: Current Meds  Medication Sig   albuterol (VENTOLIN HFA) 108 (90 Base) MCG/ACT inhaler Inhale 2 puffs into the lungs every 6 (six) hours as needed for wheezing or shortness of breath.   apixaban (ELIQUIS) 5 MG TABS tablet Take 1 tablet (5 mg total) by mouth 2 (two) times daily.   atorvastatin (LIPITOR) 20 MG tablet Take 20 mg by mouth at bedtime.   busPIRone (BUSPAR) 7.5 MG tablet Take 7.5 mg by mouth 2 (two) times daily.   Calcium Carbonate (CALCIUM 500 PO) Take 500 mg by mouth daily.   clopidogrel (PLAVIX) 75 MG tablet Take 75 mg by mouth daily.   Cyanocobalamin (B-12 PO) Take 1 capsule by mouth daily.   dicyclomine (BENTYL) 20 MG tablet Take 20 mg by mouth 3 (three) times daily as needed for spasms.    diltiazem (CARDIZEM CD) 120 MG 24 hr capsule Take 120 mg by mouth daily.   diltiazem (CARDIZEM) 30 MG tablet Take 30 mg by mouth as needed (heart rate above 120).   Ferrous Sulfate (IRON PO) Take 1 tablet by mouth daily.   fluticasone (FLONASE) 50 MCG/ACT nasal spray Place 1 spray into both nostrils 2 (two) times daily.   furosemide (LASIX) 40 MG tablet Take 40 mg by mouth daily.   gabapentin (NEURONTIN) 300 MG capsule Take 300 mg by mouth 2 (two) times daily.   ipratropium-albuterol (DUONEB) 0.5-2.5 (3) MG/3ML SOLN Take 3 mLs by nebulization 4 (four) times daily as needed (shortness of breath).   LORazepam (ATIVAN) 0.5 MG tablet Take 0.5 mg by mouth daily as needed for anxiety.   meclizine (ANTIVERT) 25 MG tablet Take 25 mg by mouth 2 (two) times daily as needed for dizziness.   metoprolol tartrate (LOPRESSOR) 50 MG tablet Take 75 mg  by mouth every morning. And takes 50 mg in the evening   mirtazapine (REMERON) 15 MG tablet Take 15 mg by mouth at bedtime.   Multiple Vitamins-Minerals (ICAPS AREDS 2 PO) Take 2 tablets by mouth daily.   ondansetron (ZOFRAN) 4 MG tablet Take 4 mg by mouth 4 (four) times daily as needed for nausea.   OXYCONTIN 15 MG 12 hr tablet Take 15 mg by mouth 3 (three) times daily.   pantoprazole (PROTONIX) 40 MG tablet Take 40 mg by mouth daily.    Potassium Chloride ER 20 MEQ TBCR Take 1 tablet by mouth daily.   sucralfate (CARAFATE) 1 g tablet Take 1 g by mouth 4 (four) times daily.     Allergies:   Ambien [zolpidem], Aspirin, Codeine, Erythromycin, Ibuprofen, and Penicillins   Social History   Socioeconomic History   Marital status: Divorced  Spouse name: Not on file   Number of children: 1   Years of education: 10   Highest education level: Not on file  Occupational History   Occupation: N/A  Tobacco Use   Smoking status: Every Day    Packs/day: 0.50    Types: Cigarettes   Smokeless tobacco: Never   Tobacco comments:    smoking sinceshewas 16  Vaping Use   Vaping Use: Never used  Substance and Sexual Activity   Alcohol use: Yes    Comment: Couple of drinks per day   Drug use: No   Sexual activity: Not on file  Other Topics Concern   Not on file  Social History Narrative   Lives at home w/ her daughter   Right-handed   Caffeine: 1 cup of coffee   Social Determinants of Health   Financial Resource Strain: Not on file  Food Insecurity: No Food Insecurity   Worried About Charity fundraiser in the Last Year: Never true   [place] in the Last Year: Never true  Transportation Needs: No Transportation Needs   Lack of Transportation (Medical): No   Lack of Transportation (Non-Medical): No  Physical Activity: Not on file  Stress: Not on file  Social Connections: Not on file     Family History: The patient's family history includes Liver cancer in her mother; Lung  cancer in her father.  ROS:   Please see the history of present illness.    All other systems reviewed and are negative.  EKGs/Labs/Other Studies Reviewed:    The following studies were reviewed today: I discussed my findings with the patient at length.   Recent Labs: 12/02/2020: BUN 18; Creatinine, Ser 1.06; Hemoglobin 9.3; Platelets 268; Potassium 4.6; Sodium 137  Recent Lipid Panel No results found for: CHOL, TRIG, HDL, CHOLHDL, VLDL, LDLCALC, LDLDIRECT  Physical Exam:    VS:  BP 134/70   Pulse (!) 118   Ht 5\' 4"  (1.626 m)   Wt 145 lb 12.8 oz (66.1 kg)   SpO2 98%   BMI 25.03 kg/m     Wt Readings from Last 3 Encounters:  02/11/21 145 lb 12.8 oz (66.1 kg)  01/10/21 141 lb 6.4 oz (64.1 kg)  12/02/20 152 lb 6.4 oz (69.1 kg)     GEN: Patient is in no acute distress HEENT: Normal NECK: No JVD; No carotid bruits LYMPHATICS: No lymphadenopathy CARDIAC: Hear sounds regular, 2/6 systolic murmur at the apex. RESPIRATORY:  Clear to auscultation without rales, wheezing or rhonchi  ABDOMEN: Soft, non-tender, non-distended MUSCULOSKELETAL:  No edema; No deformity  SKIN: Warm and dry NEUROLOGIC:  Alert and oriented x 3 PSYCHIATRIC:  Normal affect   Signed, Courtney Lindau, MD  02/11/2021 4:53 PM    [facility]

## 2021-02-18 DIAGNOSIS — J449 Chronic obstructive pulmonary disease, unspecified: Secondary | ICD-10-CM | POA: Diagnosis not present

## 2021-02-25 DIAGNOSIS — J449 Chronic obstructive pulmonary disease, unspecified: Secondary | ICD-10-CM | POA: Diagnosis not present

## 2021-02-25 DIAGNOSIS — F17210 Nicotine dependence, cigarettes, uncomplicated: Secondary | ICD-10-CM | POA: Diagnosis not present

## 2021-02-25 DIAGNOSIS — R110 Nausea: Secondary | ICD-10-CM | POA: Diagnosis not present

## 2021-02-25 DIAGNOSIS — Z9981 Dependence on supplemental oxygen: Secondary | ICD-10-CM | POA: Diagnosis not present

## 2021-02-25 DIAGNOSIS — J90 Pleural effusion, not elsewhere classified: Secondary | ICD-10-CM | POA: Diagnosis not present

## 2021-02-25 DIAGNOSIS — R040 Epistaxis: Secondary | ICD-10-CM | POA: Diagnosis not present

## 2021-02-25 DIAGNOSIS — R58 Hemorrhage, not elsewhere classified: Secondary | ICD-10-CM | POA: Diagnosis not present

## 2021-02-25 DIAGNOSIS — J9811 Atelectasis: Secondary | ICD-10-CM | POA: Diagnosis not present

## 2021-02-25 DIAGNOSIS — J9611 Chronic respiratory failure with hypoxia: Secondary | ICD-10-CM | POA: Diagnosis not present

## 2021-02-25 DIAGNOSIS — R404 Transient alteration of awareness: Secondary | ICD-10-CM | POA: Diagnosis not present

## 2021-02-25 DIAGNOSIS — I517 Cardiomegaly: Secondary | ICD-10-CM | POA: Diagnosis not present

## 2021-02-25 DIAGNOSIS — Z743 Need for continuous supervision: Secondary | ICD-10-CM | POA: Diagnosis not present

## 2021-02-25 DIAGNOSIS — D649 Anemia, unspecified: Secondary | ICD-10-CM | POA: Diagnosis not present

## 2021-02-25 DIAGNOSIS — R0602 Shortness of breath: Secondary | ICD-10-CM | POA: Diagnosis not present

## 2021-02-25 DIAGNOSIS — I4891 Unspecified atrial fibrillation: Secondary | ICD-10-CM | POA: Diagnosis not present

## 2021-02-26 ENCOUNTER — Other Ambulatory Visit: Payer: Medicare Other | Admitting: *Deleted

## 2021-02-26 NOTE — Patient Outreach (Signed)
[facility]) Care Management  02/26/2021  Courtney Grant May 15, 1932 [id number]   Telephone Assessment-Successful  RN spoke with [name] (Daughter) who reports pt continues to do well however ED visit on yesterday due to a nose bleed. Pt was released home with no acute interventions other then for pt to call her 02 supplier to attach an humidifier to the concentrator. Daughter states she will contact the agency to apply the humidifier.   Plan of care reviewed and discussed as pt continues to be adherence to the ongoing goals and interventions.   Goals Addressed             This Visit's Progress    THN-[place]ain My Quality of Life   On track    Timeframe:  Long-Range Goal Priority:  Medium Start Date:   11/01/2020                          Expected End Date:  05/13/2021                     Follow Up Date 03/31/2021 Barriers: Health Behaviors      - complete a living will - do one enjoyable thing every day - make shared treatment decisions with doctor - spend time with a child every day, borrow one if I have to    Why is this important?   Having a long-term illness can be scary.  It can also be stressful for you and your caregiver.  These steps may help.    Notes:  11/16-Verified pt's quality of life surrounds her smoking on the back porch noting wanting to cease smoking at this time. Will continue to offer smoking cessation program if pt is willing to participate.Will follow up once another on this topic next follow up. 8/19-Discuss with caregiver pt's routine and daily quality of life. Pt has a good support system with live in daughter and son-in-law who provides pt with transportation when needed.     THN-Track and Manage My Symptoms-COPD   On track    Timeframe:  Short-Term Goal Priority:  Medium Start Date: 11/01/2020                            Expected End Date:   04/15/2021                   Follow Up Date 03/25/2021    - begin a symptom  diary - bring symptom diary to all visits - develop a rescue plan - eliminate symptom triggers at home - follow rescue plan if symptoms flare-up - keep follow-up appointments Barriers: Health Behaviors Knowledge     Why is this important?   Tracking your symptoms and other information about your health helps your doctor plan your care.  Write down the symptoms, the time of day, what you were doing and what medicine you are taking.  You will soon learn how to manage your symptoms.     Notes:  12/14- Daughter reports pt had a recent ED visit for nose bleed however easily resolved with no acute interventions. Will encourage humidifier with the [place]. 11/16- Caregiver daughter denies any acute events for this pt with no needs presented at this time to address. States pt may need some help in the home but not willing to pay out of pocket expense and declined information related to or or  the next level of care.  Pt remains in the GREEN zone however continue to smoke and use home O2 on 2 1/2 liters. Aware not to smoke when smoking and pt does not wish to stop at this time.Will verified use of inhalers (has not been filled). Strongly encouraged renewal of pt's inhaler to prevent acute exacerbations and control her wheezing and SOB. 10/17-Pt indicates she continue to breath "okay" with no acute issues using her ongoing home O2. Remains in the GREEN zone. Denies any flare up with ongoing medication usage.  9/19-Spoke with daughter [name] reports pt is doing well with no acute issues.  8/19-Verified pt remains in the GREEN zone and continues to have needed resources to assist with any flare up or encountered symptoms.           [name], RN Care Management Coordinator [place] Office [phone]

## 2021-03-03 DIAGNOSIS — G894 Chronic pain syndrome: Secondary | ICD-10-CM | POA: Diagnosis not present

## 2021-03-03 DIAGNOSIS — I4891 Unspecified atrial fibrillation: Secondary | ICD-10-CM | POA: Diagnosis not present

## 2021-03-11 ENCOUNTER — Telehealth: Payer: Self-pay | Admitting: Cardiology

## 2021-03-11 NOTE — Telephone Encounter (Signed)
Patient's daughter was calling to get the results from the patient's monitor

## 2021-03-19 DIAGNOSIS — G8918 Other acute postprocedural pain: Secondary | ICD-10-CM | POA: Diagnosis not present

## 2021-03-19 DIAGNOSIS — M79661 Pain in right lower leg: Secondary | ICD-10-CM | POA: Diagnosis not present

## 2021-03-19 DIAGNOSIS — M5440 Lumbago with sciatica, unspecified side: Secondary | ICD-10-CM | POA: Diagnosis not present

## 2021-03-19 DIAGNOSIS — F17210 Nicotine dependence, cigarettes, uncomplicated: Secondary | ICD-10-CM | POA: Diagnosis not present

## 2021-03-19 DIAGNOSIS — G890 Central pain syndrome: Secondary | ICD-10-CM | POA: Diagnosis not present

## 2021-03-19 DIAGNOSIS — M542 Cervicalgia: Secondary | ICD-10-CM | POA: Diagnosis not present

## 2021-03-19 DIAGNOSIS — M545 Low back pain, unspecified: Secondary | ICD-10-CM | POA: Diagnosis not present

## 2021-03-19 DIAGNOSIS — M169 Osteoarthritis of hip, unspecified: Secondary | ICD-10-CM | POA: Diagnosis not present

## 2021-03-19 DIAGNOSIS — Z720 Tobacco use: Secondary | ICD-10-CM | POA: Diagnosis not present

## 2021-03-19 DIAGNOSIS — M79669 Pain in unspecified lower leg: Secondary | ICD-10-CM | POA: Diagnosis not present

## 2021-03-19 DIAGNOSIS — G894 Chronic pain syndrome: Secondary | ICD-10-CM | POA: Diagnosis not present

## 2021-03-21 DIAGNOSIS — J449 Chronic obstructive pulmonary disease, unspecified: Secondary | ICD-10-CM | POA: Diagnosis not present

## 2021-03-31 ENCOUNTER — Other Ambulatory Visit: Payer: Self-pay | Admitting: *Deleted

## 2021-03-31 NOTE — Patient Outreach (Signed)
[facility]) Care Management  03/31/2021  Courtney Grant February 06, 1933 [id number]   Telephone Assessment-Successful  RN spoke with the pt's daughter Courtney Grant who indicates pt continues to do well however recent atrial fibrillation runs resulting in pt wearing a heart monitoring device. Daughter monitoring this and reporting to the provider when needed. Pt is coping well and remains on-track related to her COPD.  Will follow up next month and update provider on pt's disposition with [facility] services.   Goals Addressed             This Visit's Progress    THN-Matintain My Quality of Life   On track    Timeframe:  Long-Range Goal Priority:  Medium Start Date:   11/01/2020                          Expected End Date:  09/12/2021                     Follow Up Date 05/01/2021 Barriers: Health Behaviors      - complete a living will - do one enjoyable thing every day - make shared treatment decisions with doctor - spend time with a child every day, borrow one if I have to    Why is this important?   Having a long-term illness can be scary.  It can also be stressful for you and your caregiver.  These steps may help.    Notes:  1/16- Spoke with daughter who indicated pt is doing well however recent atrial fibrillation resulting in heart monitor device. Daughter watching and report any related activities.  11/16-Verified pt's quality of life surrounds her smoking on the back porch noting wanting to cease smoking at this time. Will continue to offer smoking cessation program if pt is willing to participate.Will follow up once another on this topic next follow up. 8/19-Discuss with caregiver pt's routine and daily quality of life. Pt has a good support system with live in daughter and son-in-law who provides pt with transportation when needed.     THN-Track and Manage My Symptoms-COPD   On track    Timeframe:  Short-Term Goal Priority:  Medium Start Date: 11/01/2020                             Expected End Date:   05/13/2021                   Follow Up Date 05/01/2021    - begin a symptom diary - bring symptom diary to all visits - develop a rescue plan - eliminate symptom triggers at home - follow rescue plan if symptoms flare-up - keep follow-up appointments Barriers: Health Behaviors Knowledge     Why is this important?   Tracking your symptoms and other information about your health helps your doctor plan your care.  Write down the symptoms, the time of day, what you were doing and what medicine you are taking.  You will soon learn how to manage your symptoms.     Notes:  1/16- Daughter states pt is doing well with her COPD with related issues however atrial fibrillation now with heart monitoring and pending possible intervention.  12/14- Daughter reports pt had a recent ED visit for nose bleed however easily resolved with no acute interventions. Will encourage humidifier with the [place]. 11/16- Caregiver daughter denies any acute  events for this pt with no needs presented at this time to address. States pt may need some help in the home but not willing to pay out of pocket expense and declined information related to or or the next level of care.  Pt remains in the GREEN zone however continue to smoke and use home O2 on 2 1/2 liters. Aware not to smoke when smoking and pt does not wish to stop at this time.Will verified use of inhalers (has not been filled). Strongly encouraged renewal of pt's inhaler to prevent acute exacerbations and control her wheezing and SOB. 10/17-Pt indicates she continue to breath "okay" with no acute issues using her ongoing home O2. Remains in the GREEN zone. Denies any flare up with ongoing medication usage.  9/19-Spoke with daughter Courtney Grant reports pt is doing well with no acute issues.  8/19-Verified pt remains in the GREEN zone and continues to have needed resources to assist with any flare up or encountered symptoms.            [name], RN Care Management Coordinator [place] Office [phone]

## 2021-04-05 DIAGNOSIS — D649 Anemia, unspecified: Secondary | ICD-10-CM | POA: Diagnosis not present

## 2021-04-05 DIAGNOSIS — Z9049 Acquired absence of other specified parts of digestive tract: Secondary | ICD-10-CM | POA: Diagnosis not present

## 2021-04-05 DIAGNOSIS — K4090 Unilateral inguinal hernia, without obstruction or gangrene, not specified as recurrent: Secondary | ICD-10-CM | POA: Diagnosis not present

## 2021-04-05 DIAGNOSIS — R5383 Other fatigue: Secondary | ICD-10-CM | POA: Diagnosis not present

## 2021-04-05 DIAGNOSIS — R069 Unspecified abnormalities of breathing: Secondary | ICD-10-CM | POA: Diagnosis not present

## 2021-04-05 DIAGNOSIS — R110 Nausea: Secondary | ICD-10-CM | POA: Diagnosis not present

## 2021-04-05 DIAGNOSIS — R911 Solitary pulmonary nodule: Secondary | ICD-10-CM | POA: Diagnosis not present

## 2021-04-05 DIAGNOSIS — J9811 Atelectasis: Secondary | ICD-10-CM | POA: Diagnosis not present

## 2021-04-05 DIAGNOSIS — Z743 Need for continuous supervision: Secondary | ICD-10-CM | POA: Diagnosis not present

## 2021-04-05 DIAGNOSIS — J9610 Chronic respiratory failure, unspecified whether with hypoxia or hypercapnia: Secondary | ICD-10-CM | POA: Diagnosis not present

## 2021-04-05 DIAGNOSIS — R079 Chest pain, unspecified: Secondary | ICD-10-CM | POA: Diagnosis not present

## 2021-04-05 DIAGNOSIS — I482 Chronic atrial fibrillation, unspecified: Secondary | ICD-10-CM | POA: Diagnosis not present

## 2021-04-05 DIAGNOSIS — K838 Other specified diseases of biliary tract: Secondary | ICD-10-CM | POA: Diagnosis not present

## 2021-04-05 DIAGNOSIS — J441 Chronic obstructive pulmonary disease with (acute) exacerbation: Secondary | ICD-10-CM | POA: Diagnosis not present

## 2021-04-05 DIAGNOSIS — J90 Pleural effusion, not elsewhere classified: Secondary | ICD-10-CM | POA: Diagnosis not present

## 2021-04-05 DIAGNOSIS — K8689 Other specified diseases of pancreas: Secondary | ICD-10-CM | POA: Diagnosis not present

## 2021-04-05 DIAGNOSIS — R109 Unspecified abdominal pain: Secondary | ICD-10-CM | POA: Diagnosis not present

## 2021-04-05 DIAGNOSIS — R6889 Other general symptoms and signs: Secondary | ICD-10-CM | POA: Diagnosis not present

## 2021-04-05 DIAGNOSIS — R739 Hyperglycemia, unspecified: Secondary | ICD-10-CM | POA: Diagnosis not present

## 2021-04-05 DIAGNOSIS — R0689 Other abnormalities of breathing: Secondary | ICD-10-CM | POA: Diagnosis not present

## 2021-04-05 DIAGNOSIS — I5032 Chronic diastolic (congestive) heart failure: Secondary | ICD-10-CM | POA: Diagnosis not present

## 2021-04-05 DIAGNOSIS — I4811 Longstanding persistent atrial fibrillation: Secondary | ICD-10-CM | POA: Diagnosis not present

## 2021-04-05 DIAGNOSIS — N281 Cyst of kidney, acquired: Secondary | ICD-10-CM | POA: Diagnosis not present

## 2021-04-06 DIAGNOSIS — Z902 Acquired absence of lung [part of]: Secondary | ICD-10-CM | POA: Diagnosis not present

## 2021-04-06 DIAGNOSIS — M545 Low back pain, unspecified: Secondary | ICD-10-CM | POA: Diagnosis not present

## 2021-04-06 DIAGNOSIS — Z7952 Long term (current) use of systemic steroids: Secondary | ICD-10-CM | POA: Diagnosis not present

## 2021-04-06 DIAGNOSIS — R110 Nausea: Secondary | ICD-10-CM | POA: Diagnosis not present

## 2021-04-06 DIAGNOSIS — Z9049 Acquired absence of other specified parts of digestive tract: Secondary | ICD-10-CM | POA: Diagnosis not present

## 2021-04-06 DIAGNOSIS — R911 Solitary pulmonary nodule: Secondary | ICD-10-CM | POA: Diagnosis not present

## 2021-04-06 DIAGNOSIS — I110 Hypertensive heart disease with heart failure: Secondary | ICD-10-CM | POA: Diagnosis not present

## 2021-04-06 DIAGNOSIS — Z7901 Long term (current) use of anticoagulants: Secondary | ICD-10-CM | POA: Diagnosis not present

## 2021-04-06 DIAGNOSIS — M1990 Unspecified osteoarthritis, unspecified site: Secondary | ICD-10-CM | POA: Diagnosis not present

## 2021-04-06 DIAGNOSIS — Z87440 Personal history of urinary (tract) infections: Secondary | ICD-10-CM | POA: Diagnosis not present

## 2021-04-06 DIAGNOSIS — J9621 Acute and chronic respiratory failure with hypoxia: Secondary | ICD-10-CM | POA: Diagnosis not present

## 2021-04-06 DIAGNOSIS — G894 Chronic pain syndrome: Secondary | ICD-10-CM | POA: Diagnosis not present

## 2021-04-06 DIAGNOSIS — Z79899 Other long term (current) drug therapy: Secondary | ICD-10-CM | POA: Diagnosis not present

## 2021-04-06 DIAGNOSIS — R42 Dizziness and giddiness: Secondary | ICD-10-CM | POA: Diagnosis not present

## 2021-04-06 DIAGNOSIS — R5383 Other fatigue: Secondary | ICD-10-CM | POA: Diagnosis not present

## 2021-04-06 DIAGNOSIS — J90 Pleural effusion, not elsewhere classified: Secondary | ICD-10-CM | POA: Diagnosis not present

## 2021-04-06 DIAGNOSIS — E876 Hypokalemia: Secondary | ICD-10-CM | POA: Diagnosis not present

## 2021-04-06 DIAGNOSIS — R079 Chest pain, unspecified: Secondary | ICD-10-CM | POA: Diagnosis not present

## 2021-04-06 DIAGNOSIS — J9610 Chronic respiratory failure, unspecified whether with hypoxia or hypercapnia: Secondary | ICD-10-CM | POA: Diagnosis not present

## 2021-04-06 DIAGNOSIS — K8689 Other specified diseases of pancreas: Secondary | ICD-10-CM | POA: Diagnosis not present

## 2021-04-06 DIAGNOSIS — J441 Chronic obstructive pulmonary disease with (acute) exacerbation: Secondary | ICD-10-CM | POA: Diagnosis not present

## 2021-04-06 DIAGNOSIS — R279 Unspecified lack of coordination: Secondary | ICD-10-CM | POA: Diagnosis not present

## 2021-04-06 DIAGNOSIS — E875 Hyperkalemia: Secondary | ICD-10-CM | POA: Diagnosis not present

## 2021-04-06 DIAGNOSIS — R1084 Generalized abdominal pain: Secondary | ICD-10-CM | POA: Diagnosis not present

## 2021-04-06 DIAGNOSIS — Z743 Need for continuous supervision: Secondary | ICD-10-CM | POA: Diagnosis not present

## 2021-04-06 DIAGNOSIS — Z8673 Personal history of transient ischemic attack (TIA), and cerebral infarction without residual deficits: Secondary | ICD-10-CM | POA: Diagnosis not present

## 2021-04-06 DIAGNOSIS — I4891 Unspecified atrial fibrillation: Secondary | ICD-10-CM | POA: Diagnosis not present

## 2021-04-06 DIAGNOSIS — I5032 Chronic diastolic (congestive) heart failure: Secondary | ICD-10-CM | POA: Diagnosis not present

## 2021-04-06 DIAGNOSIS — I482 Chronic atrial fibrillation, unspecified: Secondary | ICD-10-CM | POA: Diagnosis not present

## 2021-04-06 DIAGNOSIS — Z9981 Dependence on supplemental oxygen: Secondary | ICD-10-CM | POA: Diagnosis not present

## 2021-04-06 DIAGNOSIS — M5116 Intervertebral disc disorders with radiculopathy, lumbar region: Secondary | ICD-10-CM | POA: Diagnosis not present

## 2021-04-06 DIAGNOSIS — G8929 Other chronic pain: Secondary | ICD-10-CM | POA: Diagnosis not present

## 2021-04-06 DIAGNOSIS — Z86711 Personal history of pulmonary embolism: Secondary | ICD-10-CM | POA: Diagnosis not present

## 2021-04-06 DIAGNOSIS — K4090 Unilateral inguinal hernia, without obstruction or gangrene, not specified as recurrent: Secondary | ICD-10-CM | POA: Diagnosis not present

## 2021-04-06 DIAGNOSIS — Z7902 Long term (current) use of antithrombotics/antiplatelets: Secondary | ICD-10-CM | POA: Diagnosis not present

## 2021-04-06 DIAGNOSIS — K838 Other specified diseases of biliary tract: Secondary | ICD-10-CM | POA: Diagnosis not present

## 2021-04-06 DIAGNOSIS — Z20822 Contact with and (suspected) exposure to covid-19: Secondary | ICD-10-CM | POA: Diagnosis not present

## 2021-04-06 DIAGNOSIS — I4811 Longstanding persistent atrial fibrillation: Secondary | ICD-10-CM | POA: Diagnosis not present

## 2021-04-06 DIAGNOSIS — I10 Essential (primary) hypertension: Secondary | ICD-10-CM | POA: Diagnosis not present

## 2021-04-06 DIAGNOSIS — E785 Hyperlipidemia, unspecified: Secondary | ICD-10-CM | POA: Diagnosis not present

## 2021-04-06 DIAGNOSIS — D649 Anemia, unspecified: Secondary | ICD-10-CM | POA: Diagnosis not present

## 2021-04-06 DIAGNOSIS — F32A Depression, unspecified: Secondary | ICD-10-CM | POA: Diagnosis not present

## 2021-04-06 DIAGNOSIS — J99 Respiratory disorders in diseases classified elsewhere: Secondary | ICD-10-CM | POA: Diagnosis not present

## 2021-04-06 DIAGNOSIS — R5381 Other malaise: Secondary | ICD-10-CM | POA: Diagnosis not present

## 2021-04-06 DIAGNOSIS — J9811 Atelectasis: Secondary | ICD-10-CM | POA: Diagnosis not present

## 2021-04-06 DIAGNOSIS — F17210 Nicotine dependence, cigarettes, uncomplicated: Secondary | ICD-10-CM | POA: Diagnosis not present

## 2021-04-06 DIAGNOSIS — N281 Cyst of kidney, acquired: Secondary | ICD-10-CM | POA: Diagnosis not present

## 2021-04-06 DIAGNOSIS — R109 Unspecified abdominal pain: Secondary | ICD-10-CM | POA: Diagnosis not present

## 2021-04-07 DIAGNOSIS — I482 Chronic atrial fibrillation, unspecified: Secondary | ICD-10-CM | POA: Diagnosis not present

## 2021-04-07 DIAGNOSIS — J441 Chronic obstructive pulmonary disease with (acute) exacerbation: Secondary | ICD-10-CM | POA: Diagnosis not present

## 2021-04-07 DIAGNOSIS — I5032 Chronic diastolic (congestive) heart failure: Secondary | ICD-10-CM | POA: Diagnosis not present

## 2021-04-08 ENCOUNTER — Telehealth: Payer: Self-pay

## 2021-04-08 DIAGNOSIS — I5032 Chronic diastolic (congestive) heart failure: Secondary | ICD-10-CM | POA: Diagnosis not present

## 2021-04-08 DIAGNOSIS — J441 Chronic obstructive pulmonary disease with (acute) exacerbation: Secondary | ICD-10-CM | POA: Diagnosis not present

## 2021-04-08 DIAGNOSIS — I482 Chronic atrial fibrillation, unspecified: Secondary | ICD-10-CM | POA: Diagnosis not present

## 2021-04-08 NOTE — Telephone Encounter (Signed)
Spoke with [name] pt's daughter (per [facility]) who states that the pt has been in the hospital since 04/05/21. She states that the pts heart rate has been increased and she is concerned. She states that they are sending her to a facility and would like you to see her in the hospital as she does not want her mom to go to a facility without her heart rate being under control. [facility] records has been printed for review. We increased her Metoprolol 12/27 after getting her Zio results. How do you advise?

## 2021-04-09 DIAGNOSIS — F17210 Nicotine dependence, cigarettes, uncomplicated: Secondary | ICD-10-CM | POA: Diagnosis not present

## 2021-04-09 DIAGNOSIS — I5031 Acute diastolic (congestive) heart failure: Secondary | ICD-10-CM | POA: Diagnosis not present

## 2021-04-09 DIAGNOSIS — G894 Chronic pain syndrome: Secondary | ICD-10-CM | POA: Diagnosis not present

## 2021-04-09 DIAGNOSIS — Z86711 Personal history of pulmonary embolism: Secondary | ICD-10-CM | POA: Diagnosis not present

## 2021-04-09 DIAGNOSIS — J441 Chronic obstructive pulmonary disease with (acute) exacerbation: Secondary | ICD-10-CM | POA: Diagnosis not present

## 2021-04-09 DIAGNOSIS — I10 Essential (primary) hypertension: Secondary | ICD-10-CM | POA: Diagnosis not present

## 2021-04-09 DIAGNOSIS — J449 Chronic obstructive pulmonary disease, unspecified: Secondary | ICD-10-CM | POA: Diagnosis not present

## 2021-04-09 DIAGNOSIS — R5381 Other malaise: Secondary | ICD-10-CM | POA: Diagnosis not present

## 2021-04-09 DIAGNOSIS — J90 Pleural effusion, not elsewhere classified: Secondary | ICD-10-CM | POA: Diagnosis not present

## 2021-04-09 DIAGNOSIS — I4891 Unspecified atrial fibrillation: Secondary | ICD-10-CM | POA: Diagnosis not present

## 2021-04-09 DIAGNOSIS — M545 Low back pain, unspecified: Secondary | ICD-10-CM | POA: Diagnosis not present

## 2021-04-09 DIAGNOSIS — Z902 Acquired absence of lung [part of]: Secondary | ICD-10-CM | POA: Diagnosis not present

## 2021-04-09 DIAGNOSIS — M6281 Muscle weakness (generalized): Secondary | ICD-10-CM | POA: Diagnosis not present

## 2021-04-09 DIAGNOSIS — M5116 Intervertebral disc disorders with radiculopathy, lumbar region: Secondary | ICD-10-CM | POA: Diagnosis not present

## 2021-04-09 DIAGNOSIS — R279 Unspecified lack of coordination: Secondary | ICD-10-CM | POA: Diagnosis not present

## 2021-04-09 DIAGNOSIS — J9621 Acute and chronic respiratory failure with hypoxia: Secondary | ICD-10-CM | POA: Diagnosis not present

## 2021-04-09 DIAGNOSIS — I639 Cerebral infarction, unspecified: Secondary | ICD-10-CM | POA: Diagnosis not present

## 2021-04-09 DIAGNOSIS — I482 Chronic atrial fibrillation, unspecified: Secondary | ICD-10-CM | POA: Diagnosis not present

## 2021-04-09 DIAGNOSIS — J99 Respiratory disorders in diseases classified elsewhere: Secondary | ICD-10-CM | POA: Diagnosis not present

## 2021-04-09 DIAGNOSIS — E785 Hyperlipidemia, unspecified: Secondary | ICD-10-CM | POA: Diagnosis not present

## 2021-04-09 DIAGNOSIS — I5032 Chronic diastolic (congestive) heart failure: Secondary | ICD-10-CM | POA: Diagnosis not present

## 2021-04-09 DIAGNOSIS — E875 Hyperkalemia: Secondary | ICD-10-CM | POA: Diagnosis not present

## 2021-04-09 DIAGNOSIS — Z743 Need for continuous supervision: Secondary | ICD-10-CM | POA: Diagnosis not present

## 2021-04-09 DIAGNOSIS — G8929 Other chronic pain: Secondary | ICD-10-CM | POA: Diagnosis not present

## 2021-04-09 DIAGNOSIS — R1084 Generalized abdominal pain: Secondary | ICD-10-CM | POA: Diagnosis not present

## 2021-04-09 NOTE — Telephone Encounter (Signed)
Recommendations reviewed with [name] per DPR as per Dr. Julien Nordmann note.  [name] verbalized understanding and had no additional questions.

## 2021-04-10 ENCOUNTER — Ambulatory Visit: Payer: Medicare Other | Admitting: Cardiology

## 2021-04-10 DIAGNOSIS — I4891 Unspecified atrial fibrillation: Secondary | ICD-10-CM | POA: Diagnosis not present

## 2021-04-10 DIAGNOSIS — I639 Cerebral infarction, unspecified: Secondary | ICD-10-CM | POA: Diagnosis not present

## 2021-04-10 DIAGNOSIS — I5031 Acute diastolic (congestive) heart failure: Secondary | ICD-10-CM | POA: Diagnosis not present

## 2021-04-10 DIAGNOSIS — J449 Chronic obstructive pulmonary disease, unspecified: Secondary | ICD-10-CM | POA: Diagnosis not present

## 2021-04-10 DIAGNOSIS — I10 Essential (primary) hypertension: Secondary | ICD-10-CM | POA: Diagnosis not present

## 2021-04-10 DIAGNOSIS — J90 Pleural effusion, not elsewhere classified: Secondary | ICD-10-CM | POA: Diagnosis not present

## 2021-04-10 DIAGNOSIS — G894 Chronic pain syndrome: Secondary | ICD-10-CM | POA: Diagnosis not present

## 2021-04-14 DIAGNOSIS — J90 Pleural effusion, not elsewhere classified: Secondary | ICD-10-CM | POA: Diagnosis not present

## 2021-04-14 DIAGNOSIS — M6281 Muscle weakness (generalized): Secondary | ICD-10-CM | POA: Diagnosis not present

## 2021-04-14 DIAGNOSIS — I5031 Acute diastolic (congestive) heart failure: Secondary | ICD-10-CM | POA: Diagnosis not present

## 2021-04-14 DIAGNOSIS — G894 Chronic pain syndrome: Secondary | ICD-10-CM | POA: Diagnosis not present

## 2021-04-14 DIAGNOSIS — I639 Cerebral infarction, unspecified: Secondary | ICD-10-CM | POA: Diagnosis not present

## 2021-04-14 DIAGNOSIS — I10 Essential (primary) hypertension: Secondary | ICD-10-CM | POA: Diagnosis not present

## 2021-04-14 DIAGNOSIS — J449 Chronic obstructive pulmonary disease, unspecified: Secondary | ICD-10-CM | POA: Diagnosis not present

## 2021-04-16 DIAGNOSIS — J449 Chronic obstructive pulmonary disease, unspecified: Secondary | ICD-10-CM | POA: Diagnosis not present

## 2021-04-16 DIAGNOSIS — J441 Chronic obstructive pulmonary disease with (acute) exacerbation: Secondary | ICD-10-CM | POA: Diagnosis not present

## 2021-04-16 DIAGNOSIS — J9621 Acute and chronic respiratory failure with hypoxia: Secondary | ICD-10-CM | POA: Diagnosis not present

## 2021-04-16 DIAGNOSIS — J99 Respiratory disorders in diseases classified elsewhere: Secondary | ICD-10-CM | POA: Diagnosis not present

## 2021-04-16 DIAGNOSIS — Z902 Acquired absence of lung [part of]: Secondary | ICD-10-CM | POA: Diagnosis not present

## 2021-04-16 DIAGNOSIS — G894 Chronic pain syndrome: Secondary | ICD-10-CM | POA: Diagnosis not present

## 2021-04-16 DIAGNOSIS — R1084 Generalized abdominal pain: Secondary | ICD-10-CM | POA: Diagnosis not present

## 2021-04-16 DIAGNOSIS — I10 Essential (primary) hypertension: Secondary | ICD-10-CM | POA: Diagnosis not present

## 2021-04-16 DIAGNOSIS — F17210 Nicotine dependence, cigarettes, uncomplicated: Secondary | ICD-10-CM | POA: Diagnosis not present

## 2021-04-16 DIAGNOSIS — I5032 Chronic diastolic (congestive) heart failure: Secondary | ICD-10-CM | POA: Diagnosis not present

## 2021-04-16 DIAGNOSIS — E875 Hyperkalemia: Secondary | ICD-10-CM | POA: Diagnosis not present

## 2021-04-16 DIAGNOSIS — G8929 Other chronic pain: Secondary | ICD-10-CM | POA: Diagnosis not present

## 2021-04-16 DIAGNOSIS — Z86711 Personal history of pulmonary embolism: Secondary | ICD-10-CM | POA: Diagnosis not present

## 2021-04-16 DIAGNOSIS — E785 Hyperlipidemia, unspecified: Secondary | ICD-10-CM | POA: Diagnosis not present

## 2021-04-16 DIAGNOSIS — I4891 Unspecified atrial fibrillation: Secondary | ICD-10-CM | POA: Diagnosis not present

## 2021-04-16 DIAGNOSIS — I5031 Acute diastolic (congestive) heart failure: Secondary | ICD-10-CM | POA: Diagnosis not present

## 2021-04-16 DIAGNOSIS — J90 Pleural effusion, not elsewhere classified: Secondary | ICD-10-CM | POA: Diagnosis not present

## 2021-04-16 DIAGNOSIS — M545 Low back pain, unspecified: Secondary | ICD-10-CM | POA: Diagnosis not present

## 2021-04-16 DIAGNOSIS — M5116 Intervertebral disc disorders with radiculopathy, lumbar region: Secondary | ICD-10-CM | POA: Diagnosis not present

## 2021-05-01 ENCOUNTER — Other Ambulatory Visit: Payer: Self-pay | Admitting: *Deleted

## 2021-05-01 NOTE — Patient Outreach (Signed)
[facility]) Care Management Telephonic RN Care Manager Note   05/01/2021 Name:  Courtney Grant MRN:  [id number] DOB:  [birth year]  Summary: Pt with recent hospitalization and post op SNF discharged 2/13 as informed by the pt's daughter [name] for viral respiratory infection. States pt is doing well recovering with no additional symptoms and managing her COPD in the GREEN zone today. Hospital follow up appointment scheduled for Monday 2/20 with Dr. Reesa Chew. Caregiver daughter does not wish to proceed with week transition of care call and continue to be receptive to ongoing monthly follow up calls. Offered to review medications however daughter states only one new medication was provider and she is aware with no needed education. Would like to go over medication with provider on Monday and declined review today.  Recommendations/Changes made from today's visit: TOoreview the COPD zones and monitor pt for any residual symptoms that may need to be reported to the provider for early interventions to prevent readmissions.  Subjective: Courtney Grant is an 88 y.o. year old female who is a primary patient of Garwin Brothers, MD. The care management team was consulted for assistance with care management and/or care coordination needs.    Telephonic RN Care Manager completed Telephone Visit today.  Objective:   Medications Reviewed Today     Reviewed by Lowella Grip, CMA (Certified Medical Assistant) on 02/11/21 at 4  Med List Status: <None>   Medication Order Taking? Sig Documenting Provider Last Dose Status Informant  albuterol (VENTOLIN HFA) 108 (90 Base) MCG/ACT inhaler [id number]  Inhale 2 puffs into the lungs every 6 (six) hours as needed for wheezing or shortness of breath. [provider]  Active   apixaban (ELIQUIS) 5 MG TABS tablet [id number]  Take 1 tablet (5 mg total) by mouth 2 (two) times daily. Revankar, Reita Cliche, MD  Active   atorvastatin (LIPITOR) 20 MG  tablet [id number]  Take 20 mg by mouth at bedtime. [provider]  Active Family Member  busPIRone (BUSPAR) 7.5 MG tablet [id number]  Take 7.5 mg by mouth 2 (two) times daily. [provider]  Active Family Member  Calcium Carbonate (CALCIUM 500 PO) [id number]  Take 500 mg by mouth daily. [provider]  Active   clopidogrel (PLAVIX) 75 MG tablet [id number]  Take 75 mg by mouth daily. [provider]  Active   Cyanocobalamin (B-12 PO) [id number]  Take 1 capsule by mouth daily. [provider]  Active   dicyclomine (BENTYL) 20 MG tablet [id number]  Take 20 mg by mouth 3 (three) times daily as needed for spasms.  [provider]  Active Family Member           Med Note Reather Littler, SEAN   Sun Oct 31, 2017 11:16 AM)    diltiazem (CARDIZEM CD) 120 MG 24 hr capsule [id number]  Take 120 mg by mouth daily. [provider]  Active   diltiazem (CARDIZEM) 30 MG tablet [id number]  Take 30 mg by mouth as needed (heart rate above 120). [provider]  Active   Ferrous Sulfate (IRON PO) [id number]  Take 1 tablet by mouth daily. [provider]  Active   fluticasone (FLONASE) 50 MCG/ACT nasal spray [id number]  Place 1 spray into both nostrils 2 (two) times daily. [provider]  Active   furosemide (LASIX) 40 MG tablet [id number]  Take 40 mg by mouth daily. [provider]  Active   gabapentin (NEURONTIN) 300 MG capsule [id number]  Take  300 mg by mouth 2 (two) times daily. [provider]  Active   ipratropium-albuterol (DUONEB) 0.5-2.5 (3) MG/3ML SOLN [id number]  Take 3 mLs by nebulization 4 (four) times daily as needed (shortness of breath). [provider]  Active   LORazepam (ATIVAN) 0.5 MG tablet [id number]  Take 0.5 mg by mouth daily as needed for anxiety. [provider]  Active   meclizine (ANTIVERT) 25 MG tablet [id number]  Take 25 mg by mouth 2 (two) times daily as needed for dizziness.  [provider]  Active   metoprolol tartrate (LOPRESSOR) 50 MG tablet [id number]  Take 75 mg by mouth every morning. And takes 50 mg in the evening [provider]  Active   mirtazapine (REMERON) 15 MG tablet [id number]  Take 15 mg by mouth at bedtime. [provider]  Active   Multiple Vitamins-Minerals (ICAPS AREDS 2 PO) [id number]  Take 2 tablets by mouth daily. [provider]  Active Family Member  ondansetron (ZOFRAN) 4 MG tablet [id number]  Take 4 mg by mouth 4 (four) times daily as needed for nausea. [provider]  Active   OXYCONTIN 15 MG 12 hr tablet [id number]  Take 15 mg by mouth 3 (three) times daily. [provider]  Active   pantoprazole (PROTONIX) 40 MG tablet [id number]  Take 40 mg by mouth daily.  [provider]  Active Family Member           Med Note Eddie North Oct 31, 2017 11:23 AM)    Potassium Chloride ER 20 MEQ TBCR [id number]  Take 1 tablet by mouth daily. [provider]  Active   sucralfate (CARAFATE) 1 g tablet [id number]  Take 1 g by mouth 4 (four) times daily. [provider]  Active              SDOH:  (Social Determinants of Health) assessments and interventions performed:     Care Plan  Review of patient past medical history, allergies, medications, health status, including review of consultants reports, laboratory and other test data, was performed as part of comprehensive evaluation for care management services.   Care Plan : General Plan of Care (Adult)  Updates made by Tobi Bastos, RN since 05/01/2021 12:00 AM     Problem: Coping Skills (General Plan of Care)      Goal: Coping Skills Enhanced Completed 05/01/2021  Start Date: 11/01/2020  Expected End Date: 05/13/2021  Recent Progress: On track  Priority: Medium  Note:   Evidence-based guidance:  Acknowledge, normalize and validate difficulty of making life-long lifestyle changes.  Identify current  effective and ineffective coping strategies.  Encourage patient and caregiver participation in care to increase self-esteem, confidence and feelings of control.  Consider alternative and complementary therapy approaches such as meditation, mindfulness or yoga.  Encourage participation in cognitive behavioral therapy to foster a positive identity, increase self-awareness, as well as bolster self-esteem, confidence and self-efficacy.  Discuss spirituality; be present as concerns are identified; encourage journaling, prayer, worship services, meditation or pastoral counseling.  Encourage participation in pleasurable group activities such as hobbies, singing, sports or volunteering).  Encourage the use of mindfulness; refer for training or intensive intervention.  Consider the use of meditative movement therapy such as tai chi, yoga or qigong.  Promote a regular daily exercise program based on tolerance, ability and patient choice to support positive thinking about disease or aging.   Notes:  Resolving due to duplicate goal 3/00/7622  Task: Support Psychosocial Response to Risk or Actual Health Condition Completed 05/01/2021  Due Date: 05/13/2021  Priority: Routine  Note:   Care Management Activities:    - active listening utilized - caregiver stress acknowledged - counseling provided - current coping strategies identified - healthy lifestyle promoted - problem-solving facilitated    Notes:  1/16-Daughter reports pt is coping well with the current heart monitoring device as she is watching the device and communicating with the provider.  12/14-Pt continue to respond quickly to any related health issues. Pt continue to manager her ongoing medical well and aware when to seek medical attention. 11/16-Verified pt has not experienced any acute issues and continue to manage her care independently with no risk factors identified. 10/17-Verified pt continues to have a strong support system with her  son-in-law and daughter [name]. Pt reminded of ongoing services offered by [facility] for LCSW and/or pharmacy with any pharmaceutical needs. 9/19-Pt continue to have a good support system with her daughter [name] and reports her upcoming appointments pending her CAD provider. 8/19-Topic discussed with counseling and offer [facility] assistance with CSW for additional counseling if needed. Pt has a good support system.    Problem: Quality of Life (General Plan of Care)   Priority: Medium     Long-Range Goal: Quality of Life Maintained Completed 05/01/2021  Start Date: 11/01/2020  Expected End Date: 09/12/2021  Recent Progress: On track  Priority: Medium  Note:   Evidence-based guidance:  Assess patient's thoughts about quality of life, goals and expectations, and dissatisfaction or desire to improve.  Identify issues of primary importance such as mental health, illness, exercise tolerance, pain, sexual function and intimacy, cognitive change, social isolation, finances and relationships.  Assess and monitor for signs/symptoms of psychosocial concerns, especially depression or ideations regarding harm to others or self; provide or refer for mental health services as needed.  Identify sensory issues that impact quality of life such as hearing loss, vision deficit; strategize ways to maintain or improve hearing, vision.  Promote access to services in the community to support independence such as support groups, home visiting programs, financial assistance, handicapped parking tags, durable medical equipment and emergency responder.  Promote activities to decrease social isolation such as group support or social, leisure and recreational activities, employment, use of social media; consider safety concerns about being out of home for activities.  Provide patient an opportunity to share by storytelling or a "life review" to give positive meaning to life and to assist with coping and negative experiences.  Encourage  patient to tap into hope to improve sense of self.  Counsel based on prognosis and as early as possible about end-of-life and palliative care; consider referral to palliative care provider.  Advocate for the development of palliative care plan that may include avoidance of unnecessary testing and intervention, symptom control, discontinuation of medications, hospice and organ donation.  Counsel as early as possible those with life-limiting chronic disease about palliative care; consider referral to palliative care provider.  Advocate for the development of palliative care plan.   Notes:  Resolving due to duplicate goal 3/71/6967    Task: Support and Maintain Acceptable Degree of Health, Comfort and Happiness Completed 05/01/2021  Due Date: 09/12/2021  Priority: Routine  Note:   Care Management Activities:    - community involvement promoted - independence in all possible areas promoted - patient strengths promoted - self-expression encouraged    Notes:  1/16- Pt doing well with recent issues with Atrial Fibrillation and currently wearing  a heart monitoring. Daughter states she is watching this and reporting accordingly to the provider.  No other changes over the last two months. 11/16- Pt remains independent in managing her ongoing care with no acute needs presented today. Will continue to promote and encouraged caregiver to focus on pt's strengths and promote healthy habits for pt. 8/19-Discussed the above strategies for promoting ongoing healthy habits.    Care Plan : RN Care Manager Plan of Care  Updates made by Tobi Bastos, RN since 05/01/2021 12:00 AM     Problem: Knowledge Deficit Related to COPD management   Priority: High     Long-Range Goal: Development of Plan of Care for managing COPD   Start Date: 05/01/2021  Expected End Date: 10/13/2021  Priority: High  Note:   Current Barriers:  Knowledge Deficits related to plan of care for management of COPD   RNCM Clinical  Goal(s):  Patient will verbalize understanding of plan for management of COPD as evidenced by self/caregiver reporting verbalize basic understanding of  COPD disease process and self health management plan as evidenced by self/caregiver reporting take all medications exactly as prescribed and will call provider for medication related questions as evidenced by caregiver verification of pt's adherence with medication administration and through collaboration with RN Care manager, provider, and care team.   Interventions: Inter-disciplinary care team collaboration (see longitudinal plan of care) Evaluation of current treatment plan related to  self management and patient's adherence to plan as established by provider   COPD Interventions:  (Status:  New goal.) Long Term Goal Provided patient with basic written and verbal COPD education on self care/management/and exacerbation prevention Advised patient to track and manage COPD triggers Provided instruction about proper use of medications used for management of COPD including inhalers Advised patient to self assesses COPD action plan zone and make appointment with provider if in the yellow zone for 48 hours without improvement Advised patient to engage in light exercise as tolerated 3-5 days a week to aid in the the management of COPD Provided education about and advised patient to utilize infection prevention strategies to reduce risk of respiratory infection Screening for signs and symptoms of depression related to chronic disease state  Assessed social determinant of health barriers  Patient Goals/Self-Care Activities: Take all medications as prescribed Attend all scheduled provider appointments Call pharmacy for medication refills 3-7 days in advance of running out of medications Attend church or other social activities Perform all self care activities independently  Perform IADL's (shopping, preparing meals, housekeeping, managing finances)  independently Call provider office for new concerns or questions  eliminate smoking in my home identify and avoid work-related triggers identify and remove indoor air pollutants develop a rescue plan eliminate symptom triggers at home keep follow-up appointments: as discussed today with PCP on Monday use an extra pillow to sleep don't eat or exercise right before bedtime eat healthy/prescribed diet: Heart Healthy get at least 7 to 8 hours of sleep at night use devices that will help like a cane, sock-puller or reacher practice relaxation or meditation daily do breathing exercises every day do breathing exercises at least 2 times each day do exercises in a comfortable position that makes breathing as easy as possible  Follow Up Plan:  Telephone follow up appointment with care management team member scheduled for:  March 2023 The patient has been provided with contact information for the care management team and has been advised to call with any health related questions or concerns.  [name], RN Care Management Coordinator [employer] [facility] [phone]

## 2021-05-01 NOTE — Patient Instructions (Signed)
Visit Information  Thank you for taking time to visit with me today. Please don't hesitate to contact me if I can be of assistance to you before our next scheduled telephone appointment.  Following are the goals we discussed today:  Take all medications as prescribed Attend all scheduled provider appointments Call pharmacy for medication refills 3-7 days in advance of running out of medications Attend church or other social activities Perform all self care activities independently  Perform IADL's (shopping, preparing meals, housekeeping, managing finances) independently Call provider office for new concerns or questions  eliminate smoking in my home identify and avoid work-related triggers identify and remove indoor air pollutants develop a rescue plan eliminate symptom triggers at home keep follow-up appointments: as discussed today with PCP on Monday use an extra pillow to sleep don't eat or exercise right before bedtime eat healthy/prescribed diet: Heart Healthy get at least 7 to 8 hours of sleep at night use devices that will help like a cane, sock-puller or reacher practice relaxation or meditation daily do breathing exercises every day do breathing exercises at least 2 times each day do exercises in a comfortable position that makes breathing as easy as possible

## 2021-05-07 DIAGNOSIS — F17210 Nicotine dependence, cigarettes, uncomplicated: Secondary | ICD-10-CM | POA: Diagnosis not present

## 2021-05-07 DIAGNOSIS — M79661 Pain in right lower leg: Secondary | ICD-10-CM | POA: Diagnosis not present

## 2021-05-07 DIAGNOSIS — G8918 Other acute postprocedural pain: Secondary | ICD-10-CM | POA: Diagnosis not present

## 2021-05-07 DIAGNOSIS — M545 Low back pain, unspecified: Secondary | ICD-10-CM | POA: Diagnosis not present

## 2021-05-07 DIAGNOSIS — G894 Chronic pain syndrome: Secondary | ICD-10-CM | POA: Diagnosis not present

## 2021-05-07 DIAGNOSIS — M179 Osteoarthritis of knee, unspecified: Secondary | ICD-10-CM | POA: Diagnosis not present

## 2021-05-07 DIAGNOSIS — M169 Osteoarthritis of hip, unspecified: Secondary | ICD-10-CM | POA: Diagnosis not present

## 2021-05-07 DIAGNOSIS — M79669 Pain in unspecified lower leg: Secondary | ICD-10-CM | POA: Diagnosis not present

## 2021-05-07 DIAGNOSIS — J439 Emphysema, unspecified: Secondary | ICD-10-CM | POA: Diagnosis not present

## 2021-05-07 DIAGNOSIS — Z720 Tobacco use: Secondary | ICD-10-CM | POA: Diagnosis not present

## 2021-05-07 DIAGNOSIS — G8929 Other chronic pain: Secondary | ICD-10-CM | POA: Diagnosis not present

## 2021-05-16 DIAGNOSIS — J9621 Acute and chronic respiratory failure with hypoxia: Secondary | ICD-10-CM | POA: Diagnosis not present

## 2021-05-16 DIAGNOSIS — J449 Chronic obstructive pulmonary disease, unspecified: Secondary | ICD-10-CM | POA: Diagnosis not present

## 2021-05-16 DIAGNOSIS — I5033 Acute on chronic diastolic (congestive) heart failure: Secondary | ICD-10-CM | POA: Diagnosis not present

## 2021-05-16 DIAGNOSIS — R7303 Prediabetes: Secondary | ICD-10-CM | POA: Diagnosis not present

## 2021-05-16 DIAGNOSIS — Z720 Tobacco use: Secondary | ICD-10-CM | POA: Diagnosis not present

## 2021-05-16 DIAGNOSIS — I480 Paroxysmal atrial fibrillation: Secondary | ICD-10-CM | POA: Diagnosis not present

## 2021-05-19 DIAGNOSIS — J449 Chronic obstructive pulmonary disease, unspecified: Secondary | ICD-10-CM | POA: Diagnosis not present

## 2021-05-28 DIAGNOSIS — M542 Cervicalgia: Secondary | ICD-10-CM | POA: Diagnosis not present

## 2021-05-28 DIAGNOSIS — G8929 Other chronic pain: Secondary | ICD-10-CM | POA: Diagnosis not present

## 2021-05-28 DIAGNOSIS — Z720 Tobacco use: Secondary | ICD-10-CM | POA: Diagnosis not present

## 2021-05-28 DIAGNOSIS — M169 Osteoarthritis of hip, unspecified: Secondary | ICD-10-CM | POA: Diagnosis not present

## 2021-05-28 DIAGNOSIS — F17210 Nicotine dependence, cigarettes, uncomplicated: Secondary | ICD-10-CM | POA: Diagnosis not present

## 2021-05-28 DIAGNOSIS — M179 Osteoarthritis of knee, unspecified: Secondary | ICD-10-CM | POA: Diagnosis not present

## 2021-05-28 DIAGNOSIS — M545 Low back pain, unspecified: Secondary | ICD-10-CM | POA: Diagnosis not present

## 2021-05-28 DIAGNOSIS — G894 Chronic pain syndrome: Secondary | ICD-10-CM | POA: Diagnosis not present

## 2021-05-28 DIAGNOSIS — J439 Emphysema, unspecified: Secondary | ICD-10-CM | POA: Diagnosis not present

## 2021-05-28 DIAGNOSIS — G8918 Other acute postprocedural pain: Secondary | ICD-10-CM | POA: Diagnosis not present

## 2021-05-29 ENCOUNTER — Other Ambulatory Visit: Payer: Self-pay | Admitting: *Deleted

## 2021-05-29 NOTE — Patient Outreach (Signed)
?[facility]) Care Management ?Telephonic RN Care Manager Note ? ? ?05/29/2021 ?Name:  Courtney Grant MRN:  [id number] DOB:  03-Aug-1932 ? ?Summary: ?Pt continues to do well with a recent hospitalization and post op rehabilitation over the last month. Pt continue to recover well with no acute needs at this time however continues to have a history of nose bleeds with her ongoing O2.  ? ?Recommendations/Changes made from today's visit: ?RN has requested pt discuss this with her daughter and request O2 agency to possibly supply humidification to her concentrator to assist with her ongoing dryness. ? ?Subjective: ?Courtney Grant is an 88 y.o. year old female who is a primary patient of Garwin Brothers, MD. The care management team was consulted for assistance with care management and/or care coordination needs.   ? ?Telephonic RN Care Manager completed Telephone Visit today. ? ?Objective:  ? ?Medications Reviewed Today   ? ? Reviewed by Lowella Grip, CMA (Certified Medical Assistant) on 02/11/21 at 64  Med List Status: <None>  ? ?Medication Order Taking? Sig Documenting Provider Last Dose Status Informant  ?albuterol (VENTOLIN HFA) 108 (90 Base) MCG/ACT inhaler [id number]  Inhale 2 puffs into the lungs every 6 (six) hours as needed for wheezing or shortness of breath. [provider]  Active   ?apixaban (ELIQUIS) 5 MG TABS tablet [id number]  Take 1 tablet (5 mg total) by mouth 2 (two) times daily. Revankar, Reita Cliche, MD  Active   ?atorvastatin (LIPITOR) 20 MG tablet [id number]  Take 20 mg by mouth at bedtime. [provider]  Active Family Member  ?busPIRone (BUSPAR) 7.5 MG tablet [id number]  Take 7.5 mg by mouth 2 (two) times daily. [provider]  Active Family Member  ?Calcium Carbonate (CALCIUM 500 PO) [id number]  Take 500 mg by mouth daily. [provider]  Active   ?clopidogrel (PLAVIX) 75 MG tablet [id number]  Take 75 mg by mouth daily. [provider]   Active   ?Cyanocobalamin (B-12 PO) [id number]  Take 1 capsule by mouth daily. [provider]  Active   ?dicyclomine (BENTYL) 20 MG tablet [id number]  Take 20 mg by mouth 3 (three) times daily as needed for spasms.  [provider]  Active Family Member  ?         ?Med Note Lonna Cobb   Sun Oct 31, 2017 11:16 AM)    ?diltiazem (CARDIZEM CD) 120 MG 24 hr capsule [id number]  Take 120 mg by mouth daily. [provider]  Active   ?diltiazem (CARDIZEM) 30 MG tablet [id number]  Take 30 mg by mouth as needed (heart rate above 120). [provider]  Active   ?Ferrous Sulfate (IRON PO) [id number]  Take 1 tablet by mouth daily. [provider]  Active   ?fluticasone (FLONASE) 50 MCG/ACT nasal spray [id number]  Place 1 spray into both nostrils 2 (two) times daily. [provider]  Active   ?furosemide (LASIX) 40 MG tablet [id number]  Take 40 mg by mouth daily. [provider]  Active   ?gabapentin (NEURONTIN) 300 MG capsule [id number]  Take 300 mg by mouth 2 (two) times daily. [provider]  Active   ?ipratropium-albuterol (DUONEB) 0.5-2.5 (3) MG/3ML SOLN [id number]  Take 3 mLs by nebulization 4 (four) times daily as needed (shortness of breath). [provider]  Active   ?LORazepam (ATIVAN) 0.5 MG tablet [id number]  Take 0.5 mg by mouth daily as needed for anxiety. [provider]  Active   ?  meclizine (ANTIVERT) 25 MG tablet [id number]  Take 25 mg by mouth 2 (two) times daily as needed for dizziness. [provider]  Active   ?metoprolol tartrate (LOPRESSOR) 50 MG tablet [id number]  Take 75 mg by mouth every morning. And takes 50 mg in the evening [provider]  Active   ?mirtazapine (REMERON) 15 MG tablet [id number]  Take 15 mg by mouth at bedtime. [provider]  Active   ?Multiple Vitamins-Minerals (ICAPS AREDS 2 PO) [id number]  Take 2 tablets by mouth daily. [provider]  Active Family Member   ?ondansetron (ZOFRAN) 4 MG tablet [id number]  Take 4 mg by mouth 4 (four) times daily as needed for nausea. [provider]  Active   ?OXYCONTIN 15 MG 12 hr tablet [id number]  Take 15 mg by mouth 3 (three) times daily. [provider]  Active   ?pantoprazole (PROTONIX) 40 MG tablet [id number]  Take 40 mg by mouth daily.  [provider]  Active Family Member  ?         ?Med Note Lonna Cobb   Sun Oct 31, 2017 11:23 AM)    ?Potassium Chloride ER 20 MEQ TBCR [id number]  Take 1 tablet by mouth daily. [provider]  Active   ?sucralfate (CARAFATE) 1 g tablet [id number]  Take 1 g by mouth 4 (four) times daily. [provider]  Active   ? ?  ?  ? ?  ? ? ? ?SDOH:  (Social Determinants of Health) assessments and interventions performed:  ? ? ? ?Care Plan ? ?Review of patient past medical history, allergies, medications, health status, including review of consultants reports, laboratory and other test data, was performed as part of comprehensive evaluation for care management services.  ? ?Care Plan : COPD (Adult)  ?Updates made by Tobi Bastos, RN since 05/29/2021 12:00 AM  ?  ? ?Problem: Symptom Exacerbation (COPD)   ?Priority: Medium  ?  ? ?Long-Range Goal: Symptom Exacerbation Prevented or Minimized   ?Start Date: 11/01/2020  ?Expected End Date: 11/13/2021  ?This Visit's Progress: On track  ?Recent Progress: On track  ?Priority: Medium  ?Note:   ?Evidence-based guidance:  ?Monitor for signs of respiratory infection, including changes in sputum color, volume and thickness, as well as fever.  ?Encourage infection prevention strategies that may include prophylactic antibiotic therapy for patients with history of frequent exacerbations or antibiotic administration during exacerbation based on presentation, risk and benefit.  ?Encourage receipt of influenza and pneumococcal vaccine.  ?Prepare patient for use of home long-term oxygen therapy in presence of sever resting  hypoxemia.  ?Prepare patients for laboratory studies or diagnostic exams, such as spirometry, pulse oximetry and arterial blood gas based on current symptoms, risk factors and presentation.  ?Assess barriers and manage adherence, including inhaler technique and persistent trigger exposure; encourage adherence, even when symptoms are controlled or infrequent.  ?Assess and monitor for signs/symptoms of psychosocial concerns, such as shortness of breath-anxiety cycle or depression that may impact stability of symptoms.  ?Identify economic resources, sociocultural beliefs, social factors and health literacy that may interfere with adherence.  ?Promote lifestyle changes when needed, including regular physical activity based on tolerance, weight loss, healthy eating and stress management.  ?Consider referral to nurse or community health worker or home-visiting program for intensive support and education (disease-management program).  ?Increase frequency of follow-up following exacerbation or hospitalization; consider transition of care interventions, such as hospital visit, home visit, telephone follow-up, review of discharge summary and resource referrals.   ?  Notes:  ?  ? ?Care Plan : RN Care Manager Plan of Care  ?Updates made by Tobi Bastos, RN since 05/29/2021 12:00 AM  ?  ? ?Problem: Knowledge Deficit Related to COPD management   ?Priority: High  ?  ? ?Long-Range Goal: Development of Plan of Care for managing COPD   ?Start Date: 05/01/2021  ?Expected End Date: 10/13/2021  ?This Visit's Progress: On track  ?Priority: High  ?Note:   ?Current Barriers:  ?Knowledge Deficits related to plan of care for management of COPD  ? ?RNCM Clinical Goal(s):  ?Patient will verbalize understanding of plan for management of COPD as evidenced by self/caregiver reporting ?verbalize basic understanding of  COPD disease process and self health management plan as evidenced by self/caregiver reporting ?take all medications exactly as  prescribed and will call provider for medication related questions as evidenced by caregiver verification of pt's adherence with medication administration and through collaboration with RN Care manager, provider, and care t

## 2021-06-19 DIAGNOSIS — J449 Chronic obstructive pulmonary disease, unspecified: Secondary | ICD-10-CM | POA: Diagnosis not present

## 2021-06-26 DIAGNOSIS — G894 Chronic pain syndrome: Secondary | ICD-10-CM | POA: Diagnosis not present

## 2021-06-27 ENCOUNTER — Other Ambulatory Visit: Payer: Self-pay | Admitting: *Deleted

## 2021-06-27 NOTE — Patient Outreach (Signed)
[facility]) Care Management Telephonic RN Care Manager Note   06/27/2021 Name:  Courtney Grant MRN:  [id number] DOB:  1933-01-30  Summary: Pt continue to do well with no acute incidents or needs. Denies any falls utilizing a new wheelchair.  No exacerbation episodes over the last month with ongoing use of home O2 on 2 1/2 liters. Pt discussed returning to [facility] for LTC facility and will further discuss wit her daughter for possible placement. Pt aware of the process with FL2 form from her provider as pt has entered this facility three times for placement in the past.  Recommendations/Changes made from today's visit: Issues with Eliquis (receptive to [facility] pharmacy) to assist. Will continue to discussed the current plan of care and follow up next month on pt's possible progress with placement.   Subjective: SHU Courtney Grant is an 88 y.o. year old female who is a primary patient of Eloisa Northern, MD. The care management team was consulted for assistance with care management and/or care coordination needs.    Telephonic RN Care Manager completed Telephone Visit today.  Objective:   Medications Reviewed Today     Reviewed by Dione Housekeeper, CMA (Certified Medical Assistant) on 02/11/21 at 1540  Med List Status: <None>   Medication Order Taking? Sig Documenting Provider Last Dose Status Informant  albuterol (VENTOLIN HFA) 108 (90 Base) MCG/ACT inhaler [id number]  Inhale 2 puffs into the lungs every 6 (six) hours as needed for wheezing or shortness of breath. [provider]  Active   apixaban (ELIQUIS) 5 MG TABS tablet [id number]  Take 1 tablet (5 mg total) by mouth 2 (two) times daily. Revankar, Aundra Dubin, MD  Active   atorvastatin (LIPITOR) 20 MG tablet [id number]  Take 20 mg by mouth at bedtime. [provider]  Active Family Member  busPIRone (BUSPAR) 7.5 MG tablet [id number]  Take 7.5 mg by mouth 2 (two) times daily. [provider]  Active Family  Member  Calcium Carbonate (CALCIUM 500 PO) [id number]  Take 500 mg by mouth daily. [provider]  Active   clopidogrel (PLAVIX) 75 MG tablet [id number]  Take 75 mg by mouth daily. [provider]  Active   Cyanocobalamin (B-12 PO) [id number]  Take 1 capsule by mouth daily. [provider]  Active   dicyclomine (BENTYL) 20 MG tablet [id number]  Take 20 mg by mouth 3 (three) times daily as needed for spasms.  [provider]  Active Family Member           Med Note Gertie Gowda, SEAN   Sun Oct 31, 2017 11:16 AM)    diltiazem (CARDIZEM CD) 120 MG 24 hr capsule [id number]  Take 120 mg by mouth daily. [provider]  Active   diltiazem (CARDIZEM) 30 MG tablet [id number]  Take 30 mg by mouth as needed (heart rate above 120). [provider]  Active   Ferrous Sulfate (IRON PO) [id number]  Take 1 tablet by mouth daily. [provider]  Active   fluticasone (FLONASE) 50 MCG/ACT nasal spray [id number]  Place 1 spray into both nostrils 2 (two) times daily. [provider]  Active   furosemide (LASIX) 40 MG tablet [id number]  Take 40 mg by mouth daily. [provider]  Active   gabapentin (NEURONTIN) 300 MG capsule [id number]  Take 300 mg by mouth 2 (two) times daily. [provider]  Active   ipratropium-albuterol (DUONEB) 0.5-2.5 (3) MG/3ML SOLN [id number]  Take 3 mLs by nebulization 4 (  four) times daily as needed (shortness of breath). [provider]  Active   LORazepam (ATIVAN) 0.5 MG tablet [id number]  Take 0.5 mg by mouth daily as needed for anxiety. [provider]  Active   meclizine (ANTIVERT) 25 MG tablet [id number]  Take 25 mg by mouth 2 (two) times daily as needed for dizziness. [provider]  Active   metoprolol tartrate (LOPRESSOR) 50 MG tablet [id number]  Take 75 mg by mouth every morning. And takes 50 mg in the evening [provider]  Active   mirtazapine (REMERON) 15 MG tablet  [id number]  Take 15 mg by mouth at bedtime. [provider]  Active   Multiple Vitamins-Minerals (ICAPS AREDS 2 PO) [id number]  Take 2 tablets by mouth daily. [provider]  Active Family Member  ondansetron (ZOFRAN) 4 MG tablet [id number]  Take 4 mg by mouth 4 (four) times daily as needed for nausea. [provider]  Active   OXYCONTIN 15 MG 12 hr tablet [id number]  Take 15 mg by mouth 3 (three) times daily. [provider]  Active   pantoprazole (PROTONIX) 40 MG tablet [id number]  Take 40 mg by mouth daily.  [provider]  Active Family Member           Med Note Armando Reichert Oct 31, 2017 11:23 AM)    Potassium Chloride ER 20 MEQ TBCR [id number]  Take 1 tablet by mouth daily. [provider]  Active   sucralfate (CARAFATE) 1 g tablet [id number]  Take 1 g by mouth 4 (four) times daily. [provider]  Active              SDOH:  (Social Determinants of Health) assessments and interventions performed:     Care Plan  Review of patient past medical history, allergies, medications, health status, including review of consultants reports, laboratory and other test data, was performed as part of comprehensive evaluation for care management services.   Care Plan : RN Care Manager Plan of Care  Updates made by Alejandro Mulling, RN since 06/27/2021 12:00 AM     Problem: Knowledge Deficit Related to COPD management   Priority: High     Long-Range Goal: Development of Plan of Care for managing COPD   Start Date: 05/01/2021  Expected End Date: 10/13/2021  This Visit's Progress: On track  Recent Progress: On track  Priority: High  Note:   Current Barriers:  Knowledge Deficits related to plan of care for management of COPD   RNCM Clinical Goal(s):  Patient will verbalize understanding of plan for management of COPD as evidenced by self/caregiver reporting verbalize basic understanding of  COPD disease process and self  health management plan as evidenced by self/caregiver reporting take all medications exactly as prescribed and will call provider for medication related questions as evidenced by caregiver verification of pt's adherence with medication administration and through collaboration with RN Care manager, provider, and care team.   Interventions: Inter-disciplinary care team collaboration (see longitudinal plan of care) Evaluation of current treatment plan related to  self management and patient's adherence to plan as established by provider   COPD Interventions:  (Status:  Goal on track:  Yes.) Long Term Goal Provided patient with basic written and verbal COPD education on self care/management/and exacerbation prevention Advised patient to track and manage COPD triggers Provided instruction about proper use of medications used for management of COPD including inhalers Advised patient to self assesses COPD action plan zone  and make appointment with provider if in the yellow zone for 48 hours without improvement Advised patient to engage in light exercise as tolerated 3-5 days a week to aid in the the management of COPD Provided education about and advised patient to utilize infection prevention strategies to reduce risk of respiratory infection Screening for signs and symptoms of depression related to chronic disease state  Assessed social determinant of health barriers  3/16 Update: Pt reports she is doing well with no acute issues with her breathing. Reports ongoing use of her inhalers and nebulizer therapy. Pt remains on 2 liters however has issues nose bleeds due to the oxygen. Pt not sure what agency supplies her O2. RN encouraged pt to speak with her daughter to request possible humidification to the concentrator to assist with increase moisture to her nasal to possible assist with her dry nostrils.also lubrication will also assist. Pt remains in the GREEN zone with no acute issues or problems  mentioned. No needs presented at this time.   06/27/2021 Update: Pt continues to do well with no acute issues. Pt states she continues to struggle with nose bleeds however resolved fairly quickly with applied pressures (provided aware). Pt remains on 2 1/2 liters home O2 and now uses a indoors wheelchair for mobility in the home. Denies any falls or injuries and pt continue to adherence to the discussed plan of care. Pt now uses a humidifier as discussed and recommended from last conversation to assist with added moisture to the room. Pt has expressed possible placement in long term care facility and reports she has lives at [facility] three times and wishes to return to this facility. States her daughter works and not available until 5 pm. Encouraged pt to discuss her thoughts with her family and reach out the the facility of choice for possible beds that maybe available. Discussed provider completing an FL2 as pt familiar with this form for placement. Other issues related to te price of Eliquis, offered assist via [facility] pharmacy for possible available coupons (receptive). Will follow up next month on pt's progress with possible placement to LTC.  Patient Goals/Self-Care Activities: Take all medications as prescribed Call pharmacy for medication refills 3-7 days in advance of running out of medications Attend church or other social activities Perform all self care activities independently  Perform IADL's (shopping, preparing meals, housekeeping, managing finances) independently Call provider office for new concerns or questions  eliminate smoking in my home identify and avoid work-related triggers identify and remove indoor air pollutants develop a rescue plan eliminate symptom triggers at home use an extra pillow to sleep don't eat or exercise right before bedtime eat healthy/prescribed diet: Heart Healthy get at least 7 to 8 hours of sleep at night use devices that will help like a cane, sock-puller  or reacher practice relaxation or meditation daily do breathing exercises every day do breathing exercises at least 2 times each day do exercises in a comfortable position that makes breathing as easy as possible  Follow Up Plan:  Telephone follow up appointment with care management team member scheduled for:  May 2023 The patient has been provided with contact information for the care management team and has been advised to call with any health related questions or concerns.        Elliot Cousin, RN Care Management Coordinator [facility] Main Office [phone]

## 2021-06-27 NOTE — Patient Outreach (Signed)
[facility]) Care Management ? ?06/27/2021 ? ?Courtney Grant ?05-24-1932 ?[id number] ? ? ? ?Referral from [name], RN for medication assistance sent to [facility] via [email or web address] for follow up. ? ?Ina Homes ?[facility]-Care Management Assistant ?[phone] ?

## 2021-07-03 DIAGNOSIS — J80 Acute respiratory distress syndrome: Secondary | ICD-10-CM | POA: Diagnosis not present

## 2021-07-03 DIAGNOSIS — R6889 Other general symptoms and signs: Secondary | ICD-10-CM | POA: Diagnosis not present

## 2021-07-03 DIAGNOSIS — G8929 Other chronic pain: Secondary | ICD-10-CM | POA: Diagnosis not present

## 2021-07-03 DIAGNOSIS — R000 Tachycardia, unspecified: Secondary | ICD-10-CM | POA: Diagnosis not present

## 2021-07-03 DIAGNOSIS — L304 Erythema intertrigo: Secondary | ICD-10-CM | POA: Diagnosis not present

## 2021-07-03 DIAGNOSIS — I4891 Unspecified atrial fibrillation: Secondary | ICD-10-CM | POA: Diagnosis not present

## 2021-07-03 DIAGNOSIS — Z79899 Other long term (current) drug therapy: Secondary | ICD-10-CM | POA: Diagnosis not present

## 2021-07-03 DIAGNOSIS — R109 Unspecified abdominal pain: Secondary | ICD-10-CM | POA: Diagnosis not present

## 2021-07-03 DIAGNOSIS — Z7902 Long term (current) use of antithrombotics/antiplatelets: Secondary | ICD-10-CM | POA: Diagnosis not present

## 2021-07-03 DIAGNOSIS — J441 Chronic obstructive pulmonary disease with (acute) exacerbation: Secondary | ICD-10-CM | POA: Diagnosis not present

## 2021-07-03 DIAGNOSIS — I482 Chronic atrial fibrillation, unspecified: Secondary | ICD-10-CM | POA: Diagnosis not present

## 2021-07-03 DIAGNOSIS — Z743 Need for continuous supervision: Secondary | ICD-10-CM | POA: Diagnosis not present

## 2021-07-03 DIAGNOSIS — J90 Pleural effusion, not elsewhere classified: Secondary | ICD-10-CM | POA: Diagnosis not present

## 2021-07-03 DIAGNOSIS — R0602 Shortness of breath: Secondary | ICD-10-CM | POA: Diagnosis not present

## 2021-07-03 DIAGNOSIS — Z20822 Contact with and (suspected) exposure to covid-19: Secondary | ICD-10-CM | POA: Diagnosis not present

## 2021-07-03 DIAGNOSIS — I499 Cardiac arrhythmia, unspecified: Secondary | ICD-10-CM | POA: Diagnosis not present

## 2021-07-07 DIAGNOSIS — Z9981 Dependence on supplemental oxygen: Secondary | ICD-10-CM | POA: Diagnosis not present

## 2021-07-07 DIAGNOSIS — J310 Chronic rhinitis: Secondary | ICD-10-CM | POA: Diagnosis not present

## 2021-07-07 DIAGNOSIS — K219 Gastro-esophageal reflux disease without esophagitis: Secondary | ICD-10-CM | POA: Diagnosis not present

## 2021-07-07 DIAGNOSIS — K117 Disturbances of salivary secretion: Secondary | ICD-10-CM | POA: Diagnosis not present

## 2021-07-07 DIAGNOSIS — I509 Heart failure, unspecified: Secondary | ICD-10-CM | POA: Diagnosis not present

## 2021-07-07 DIAGNOSIS — M545 Low back pain, unspecified: Secondary | ICD-10-CM | POA: Diagnosis not present

## 2021-07-07 DIAGNOSIS — Z7401 Bed confinement status: Secondary | ICD-10-CM | POA: Diagnosis not present

## 2021-07-07 DIAGNOSIS — R040 Epistaxis: Secondary | ICD-10-CM | POA: Diagnosis not present

## 2021-07-07 DIAGNOSIS — M1990 Unspecified osteoarthritis, unspecified site: Secondary | ICD-10-CM | POA: Diagnosis not present

## 2021-07-07 DIAGNOSIS — Z95828 Presence of other vascular implants and grafts: Secondary | ICD-10-CM | POA: Diagnosis not present

## 2021-07-07 DIAGNOSIS — Z79899 Other long term (current) drug therapy: Secondary | ICD-10-CM | POA: Diagnosis not present

## 2021-07-07 DIAGNOSIS — M6259 Muscle wasting and atrophy, not elsewhere classified, multiple sites: Secondary | ICD-10-CM | POA: Diagnosis not present

## 2021-07-07 DIAGNOSIS — Z7952 Long term (current) use of systemic steroids: Secondary | ICD-10-CM | POA: Diagnosis not present

## 2021-07-07 DIAGNOSIS — Z743 Need for continuous supervision: Secondary | ICD-10-CM | POA: Diagnosis not present

## 2021-07-07 DIAGNOSIS — J9621 Acute and chronic respiratory failure with hypoxia: Secondary | ICD-10-CM | POA: Diagnosis not present

## 2021-07-07 DIAGNOSIS — I10 Essential (primary) hypertension: Secondary | ICD-10-CM | POA: Diagnosis not present

## 2021-07-07 DIAGNOSIS — R2689 Other abnormalities of gait and mobility: Secondary | ICD-10-CM | POA: Diagnosis not present

## 2021-07-07 DIAGNOSIS — Z902 Acquired absence of lung [part of]: Secondary | ICD-10-CM | POA: Diagnosis not present

## 2021-07-07 DIAGNOSIS — R278 Other lack of coordination: Secondary | ICD-10-CM | POA: Diagnosis not present

## 2021-07-07 DIAGNOSIS — D649 Anemia, unspecified: Secondary | ICD-10-CM | POA: Diagnosis not present

## 2021-07-07 DIAGNOSIS — R58 Hemorrhage, not elsewhere classified: Secondary | ICD-10-CM | POA: Diagnosis not present

## 2021-07-07 DIAGNOSIS — I5033 Acute on chronic diastolic (congestive) heart failure: Secondary | ICD-10-CM | POA: Diagnosis not present

## 2021-07-07 DIAGNOSIS — D631 Anemia in chronic kidney disease: Secondary | ICD-10-CM | POA: Diagnosis not present

## 2021-07-07 DIAGNOSIS — R1084 Generalized abdominal pain: Secondary | ICD-10-CM | POA: Diagnosis not present

## 2021-07-07 DIAGNOSIS — I130 Hypertensive heart and chronic kidney disease with heart failure and stage 1 through stage 4 chronic kidney disease, or unspecified chronic kidney disease: Secondary | ICD-10-CM | POA: Diagnosis not present

## 2021-07-07 DIAGNOSIS — R6889 Other general symptoms and signs: Secondary | ICD-10-CM | POA: Diagnosis not present

## 2021-07-07 DIAGNOSIS — M5116 Intervertebral disc disorders with radiculopathy, lumbar region: Secondary | ICD-10-CM | POA: Diagnosis not present

## 2021-07-07 DIAGNOSIS — Z7901 Long term (current) use of anticoagulants: Secondary | ICD-10-CM | POA: Diagnosis not present

## 2021-07-07 DIAGNOSIS — R531 Weakness: Secondary | ICD-10-CM | POA: Diagnosis not present

## 2021-07-07 DIAGNOSIS — I5032 Chronic diastolic (congestive) heart failure: Secondary | ICD-10-CM | POA: Diagnosis not present

## 2021-07-07 DIAGNOSIS — J9610 Chronic respiratory failure, unspecified whether with hypoxia or hypercapnia: Secondary | ICD-10-CM | POA: Diagnosis not present

## 2021-07-07 DIAGNOSIS — Z792 Long term (current) use of antibiotics: Secondary | ICD-10-CM | POA: Diagnosis not present

## 2021-07-07 DIAGNOSIS — F17210 Nicotine dependence, cigarettes, uncomplicated: Secondary | ICD-10-CM | POA: Diagnosis not present

## 2021-07-07 DIAGNOSIS — I4891 Unspecified atrial fibrillation: Secondary | ICD-10-CM | POA: Diagnosis not present

## 2021-07-07 DIAGNOSIS — Z20822 Contact with and (suspected) exposure to covid-19: Secondary | ICD-10-CM | POA: Diagnosis not present

## 2021-07-07 DIAGNOSIS — E785 Hyperlipidemia, unspecified: Secondary | ICD-10-CM | POA: Diagnosis not present

## 2021-07-07 DIAGNOSIS — F32A Depression, unspecified: Secondary | ICD-10-CM | POA: Diagnosis not present

## 2021-07-07 DIAGNOSIS — I4819 Other persistent atrial fibrillation: Secondary | ICD-10-CM | POA: Diagnosis not present

## 2021-07-07 DIAGNOSIS — J3489 Other specified disorders of nose and nasal sinuses: Secondary | ICD-10-CM | POA: Diagnosis not present

## 2021-07-07 DIAGNOSIS — D72829 Elevated white blood cell count, unspecified: Secondary | ICD-10-CM | POA: Diagnosis not present

## 2021-07-07 DIAGNOSIS — J99 Respiratory disorders in diseases classified elsewhere: Secondary | ICD-10-CM | POA: Diagnosis not present

## 2021-07-07 DIAGNOSIS — G8929 Other chronic pain: Secondary | ICD-10-CM | POA: Diagnosis not present

## 2021-07-07 DIAGNOSIS — J90 Pleural effusion, not elsewhere classified: Secondary | ICD-10-CM | POA: Diagnosis not present

## 2021-07-07 DIAGNOSIS — G894 Chronic pain syndrome: Secondary | ICD-10-CM | POA: Diagnosis not present

## 2021-07-07 DIAGNOSIS — I110 Hypertensive heart disease with heart failure: Secondary | ICD-10-CM | POA: Diagnosis not present

## 2021-07-07 DIAGNOSIS — E876 Hypokalemia: Secondary | ICD-10-CM | POA: Diagnosis not present

## 2021-07-07 DIAGNOSIS — J449 Chronic obstructive pulmonary disease, unspecified: Secondary | ICD-10-CM | POA: Diagnosis not present

## 2021-07-07 DIAGNOSIS — Z86711 Personal history of pulmonary embolism: Secondary | ICD-10-CM | POA: Diagnosis not present

## 2021-07-11 DIAGNOSIS — E785 Hyperlipidemia, unspecified: Secondary | ICD-10-CM | POA: Diagnosis not present

## 2021-07-11 DIAGNOSIS — J3489 Other specified disorders of nose and nasal sinuses: Secondary | ICD-10-CM | POA: Diagnosis not present

## 2021-07-11 DIAGNOSIS — J9610 Chronic respiratory failure, unspecified whether with hypoxia or hypercapnia: Secondary | ICD-10-CM | POA: Diagnosis not present

## 2021-07-11 DIAGNOSIS — K219 Gastro-esophageal reflux disease without esophagitis: Secondary | ICD-10-CM | POA: Diagnosis not present

## 2021-07-11 DIAGNOSIS — I130 Hypertensive heart and chronic kidney disease with heart failure and stage 1 through stage 4 chronic kidney disease, or unspecified chronic kidney disease: Secondary | ICD-10-CM | POA: Diagnosis not present

## 2021-07-11 DIAGNOSIS — D649 Anemia, unspecified: Secondary | ICD-10-CM | POA: Diagnosis not present

## 2021-07-11 DIAGNOSIS — F17210 Nicotine dependence, cigarettes, uncomplicated: Secondary | ICD-10-CM | POA: Diagnosis not present

## 2021-07-11 DIAGNOSIS — J90 Pleural effusion, not elsewhere classified: Secondary | ICD-10-CM | POA: Diagnosis not present

## 2021-07-11 DIAGNOSIS — I10 Essential (primary) hypertension: Secondary | ICD-10-CM | POA: Diagnosis not present

## 2021-07-11 DIAGNOSIS — M5116 Intervertebral disc disorders with radiculopathy, lumbar region: Secondary | ICD-10-CM | POA: Diagnosis not present

## 2021-07-11 DIAGNOSIS — I2699 Other pulmonary embolism without acute cor pulmonale: Secondary | ICD-10-CM | POA: Diagnosis not present

## 2021-07-11 DIAGNOSIS — Z743 Need for continuous supervision: Secondary | ICD-10-CM | POA: Diagnosis not present

## 2021-07-11 DIAGNOSIS — I4891 Unspecified atrial fibrillation: Secondary | ICD-10-CM | POA: Diagnosis not present

## 2021-07-11 DIAGNOSIS — M6259 Muscle wasting and atrophy, not elsewhere classified, multiple sites: Secondary | ICD-10-CM | POA: Diagnosis not present

## 2021-07-11 DIAGNOSIS — R2689 Other abnormalities of gait and mobility: Secondary | ICD-10-CM | POA: Diagnosis not present

## 2021-07-11 DIAGNOSIS — G8929 Other chronic pain: Secondary | ICD-10-CM | POA: Diagnosis not present

## 2021-07-11 DIAGNOSIS — R278 Other lack of coordination: Secondary | ICD-10-CM | POA: Diagnosis not present

## 2021-07-11 DIAGNOSIS — J9621 Acute and chronic respiratory failure with hypoxia: Secondary | ICD-10-CM | POA: Diagnosis not present

## 2021-07-11 DIAGNOSIS — I5031 Acute diastolic (congestive) heart failure: Secondary | ICD-10-CM | POA: Diagnosis not present

## 2021-07-11 DIAGNOSIS — G894 Chronic pain syndrome: Secondary | ICD-10-CM | POA: Diagnosis not present

## 2021-07-11 DIAGNOSIS — E782 Mixed hyperlipidemia: Secondary | ICD-10-CM | POA: Diagnosis not present

## 2021-07-11 DIAGNOSIS — J99 Respiratory disorders in diseases classified elsewhere: Secondary | ICD-10-CM | POA: Diagnosis not present

## 2021-07-11 DIAGNOSIS — J310 Chronic rhinitis: Secondary | ICD-10-CM | POA: Diagnosis not present

## 2021-07-11 DIAGNOSIS — I5032 Chronic diastolic (congestive) heart failure: Secondary | ICD-10-CM | POA: Diagnosis not present

## 2021-07-11 DIAGNOSIS — M545 Low back pain, unspecified: Secondary | ICD-10-CM | POA: Diagnosis not present

## 2021-07-11 DIAGNOSIS — J449 Chronic obstructive pulmonary disease, unspecified: Secondary | ICD-10-CM | POA: Diagnosis not present

## 2021-07-11 DIAGNOSIS — R531 Weakness: Secondary | ICD-10-CM | POA: Diagnosis not present

## 2021-07-11 DIAGNOSIS — Z86711 Personal history of pulmonary embolism: Secondary | ICD-10-CM | POA: Diagnosis not present

## 2021-07-11 DIAGNOSIS — Z7401 Bed confinement status: Secondary | ICD-10-CM | POA: Diagnosis not present

## 2021-07-11 DIAGNOSIS — R1084 Generalized abdominal pain: Secondary | ICD-10-CM | POA: Diagnosis not present

## 2021-07-11 DIAGNOSIS — R040 Epistaxis: Secondary | ICD-10-CM | POA: Diagnosis not present

## 2021-07-11 DIAGNOSIS — H9201 Otalgia, right ear: Secondary | ICD-10-CM | POA: Diagnosis not present

## 2021-07-11 DIAGNOSIS — Z789 Other specified health status: Secondary | ICD-10-CM | POA: Diagnosis not present

## 2021-07-11 DIAGNOSIS — Z902 Acquired absence of lung [part of]: Secondary | ICD-10-CM | POA: Diagnosis not present

## 2021-07-11 DIAGNOSIS — I5033 Acute on chronic diastolic (congestive) heart failure: Secondary | ICD-10-CM | POA: Diagnosis not present

## 2021-07-14 DIAGNOSIS — I5031 Acute diastolic (congestive) heart failure: Secondary | ICD-10-CM | POA: Diagnosis not present

## 2021-07-14 DIAGNOSIS — R040 Epistaxis: Secondary | ICD-10-CM | POA: Diagnosis not present

## 2021-07-14 DIAGNOSIS — J449 Chronic obstructive pulmonary disease, unspecified: Secondary | ICD-10-CM | POA: Diagnosis not present

## 2021-07-14 DIAGNOSIS — J90 Pleural effusion, not elsewhere classified: Secondary | ICD-10-CM | POA: Diagnosis not present

## 2021-07-15 DIAGNOSIS — I2699 Other pulmonary embolism without acute cor pulmonale: Secondary | ICD-10-CM | POA: Diagnosis not present

## 2021-07-15 DIAGNOSIS — J310 Chronic rhinitis: Secondary | ICD-10-CM | POA: Diagnosis not present

## 2021-07-15 DIAGNOSIS — I4891 Unspecified atrial fibrillation: Secondary | ICD-10-CM | POA: Diagnosis not present

## 2021-07-15 DIAGNOSIS — Z789 Other specified health status: Secondary | ICD-10-CM | POA: Diagnosis not present

## 2021-07-15 DIAGNOSIS — J449 Chronic obstructive pulmonary disease, unspecified: Secondary | ICD-10-CM | POA: Diagnosis not present

## 2021-07-15 DIAGNOSIS — R040 Epistaxis: Secondary | ICD-10-CM | POA: Diagnosis not present

## 2021-07-15 DIAGNOSIS — I5031 Acute diastolic (congestive) heart failure: Secondary | ICD-10-CM | POA: Diagnosis not present

## 2021-07-15 DIAGNOSIS — H9201 Otalgia, right ear: Secondary | ICD-10-CM | POA: Diagnosis not present

## 2021-07-15 DIAGNOSIS — J9610 Chronic respiratory failure, unspecified whether with hypoxia or hypercapnia: Secondary | ICD-10-CM | POA: Diagnosis not present

## 2021-07-15 DIAGNOSIS — J3489 Other specified disorders of nose and nasal sinuses: Secondary | ICD-10-CM | POA: Diagnosis not present

## 2021-07-17 ENCOUNTER — Other Ambulatory Visit: Payer: Self-pay

## 2021-07-21 ENCOUNTER — Ambulatory Visit: Payer: Medicare Other | Admitting: Cardiology

## 2021-07-21 ENCOUNTER — Encounter: Payer: Self-pay | Admitting: Cardiology

## 2021-07-21 VITALS — BP 124/60 | HR 86 | Ht 63.0 in | Wt 143.6 lb

## 2021-07-21 DIAGNOSIS — R040 Epistaxis: Secondary | ICD-10-CM

## 2021-07-21 DIAGNOSIS — I10 Essential (primary) hypertension: Secondary | ICD-10-CM | POA: Diagnosis not present

## 2021-07-21 DIAGNOSIS — E782 Mixed hyperlipidemia: Secondary | ICD-10-CM | POA: Diagnosis not present

## 2021-07-21 DIAGNOSIS — I4891 Unspecified atrial fibrillation: Secondary | ICD-10-CM | POA: Diagnosis not present

## 2021-07-21 NOTE — Patient Instructions (Addendum)
Medication Instructions:  ?Your physician has recommended you make the following change in your medication:  ? ?Stop Plavix and restart Eliquis 5 mg twice daily per Dr. Gaylyn Cheers and Dr. Geraldo Pitter. ? ?*If you need a refill on your cardiac medications before your next appointment, please call your pharmacy* ? ? ?Lab Work: ?Your physician recommends that you have a BMET and CBC today in the office. ? ?If you have labs (blood work) drawn today and your tests are completely normal, you will receive your results only by: ?MyChart Message (if you have MyChart) OR ?A paper copy in the mail ?If you have any lab test that is abnormal or we need to change your treatment, we will call you to review the results. ? ? ?Testing/Procedures: ?None ordered ? ? ?Follow-Up: ?At [facility], you and your health needs are our priority.  As part of our continuing mission to provide you with exceptional heart care, we have created designated Provider Care Teams.  These Care Teams include your primary Cardiologist (physician) and Advanced Practice Providers (APPs -  Physician Assistants and Nurse Practitioners) who all work together to provide you with the care you need, when you need it. ? ?We recommend signing up for the patient portal called "MyChart".  Sign up information is provided on this After Visit Summary.  MyChart is used to connect with patients for Virtual Visits (Telemedicine).  Patients are able to view lab/test results, encounter notes, upcoming appointments, etc.  Non-urgent messages can be sent to your provider as well.   ?To learn more about what you can do with MyChart, go to [email or web address].   ? ?Your next appointment:   ?6 month(s) ? ?The format for your next appointment:   ?In Person ? ?Provider:   ?Jyl Heinz, MD ? ? ?Other Instructions ?NA ? ?

## 2021-07-21 NOTE — Progress Notes (Signed)
?Cardiology Office Note:   ? ?Date:  07/21/2021  ? ?ID:  Courtney Grant, DOB 1932-05-02, MRN [id number] ? ?PCP:  Garwin Brothers, MD  ?Cardiologist:  Jenean Lindau, MD  ? ?Referring MD: Garwin Brothers, MD  ? ? ?ASSESSMENT:   ? ?1. Primary hypertension   ?2. Atrial fibrillation, unspecified type ([place])   ?3. Mixed dyslipidemia   ?4. Essential hypertension   ?5. Epistaxis   ? ?PLAN:   ? ?In order of problems listed above: ? ?Primary prevention stressed with the patient.  Importance of compliance with diet medication stressed and she vocalized understanding ?Essential hypertension: Blood pressure stable and diet was emphasized. ?Persistent atrial fibrillation:I discussed with the patient atrial fibrillation, disease process. Management and therapy including rate and rhythm control, anticoagulation benefits and potential risks were discussed extensively with the patient. Patient had multiple questions which were answered to patient's satisfaction.  I think it is a good idea to start anticoagulation with Eliquis if it is acceptable to my ENT colleague.  I would first like his opinion and I wrote that on the sheet of paper that the patient came in with.  Also I am unsure as to why the patient is on Plavix and in my opinion from a cardiovascular standpoint it can be withheld.  The reason I am recommending Eliquis to be restarted if there is an acceptable risk is because stroke can be devastating in this lady in the absence of anticoagulation. ?COPD on supplemental oxygen: Managed by primary care. ?Epistaxis: Resolved and followed by ENT. ?Patient will be seen in follow-up appointment in 6 months or earlier if the patient has any concerns ? ? ? ?Medication Adjustments/Labs and Tests Ordered: ?Current medicines are reviewed at length with the patient today.  Concerns regarding medicines are outlined above.  ?Orders Placed This Encounter  ?Procedures  ? CBC with Differential/Platelet  ? Basic metabolic panel  ? ?No orders of the  defined types were placed in this encounter. ? ? ? ?No chief complaint on file. ?  ? ?History of Present Illness:   ? ?Courtney Grant is a 88 y.o. female.  Patient has past medical history of persistent atrial fibrillation, essential hypertension, dyslipidemia, COPD on oxygen supplemental.  Patient received treatment for significant epistaxis and Eliquis and Plavix were discontinued.  I am not sure why the patient is on Plavix therapy.  She denies any problems at this time.  She essentially is wheelchair-bound and assisted living facility.  Her daughter accompanies her for this visit. ? ?Past Medical History:  ?Diagnosis Date  ? Abnormal transaminases   ? Arthritis 01/29/2016  ? Overview:  Generalized  ? Atherosclerosis of native artery of both lower extremities with intermittent claudication ([place]) 10/13/2016  ? Formatting of this note might be different from the original. Added automatically from request for surgery [id number]  ? Atrial fibrillation ([place])   ? Bacteremia due to methicillin susceptible Staphylococcus aureus (MSSA) 10/30/2017  ? Barrett's esophagus   ? Benign paroxysmal positional vertigo 10/25/2015  ? Biliary dyskinesia 01/29/2016  ? Cancer of lung ([place])   ? ADENOCARINOMA RUL  Overview:  Overview:  ADENOCARINOMA RUL  ? Chronic back pain 10/30/2017  ? Chronic CHF (congestive heart failure) (HCC)   ? Complication of anesthesia   ? COPD (chronic obstructive pulmonary disease) ([place])   ? Depression 01/29/2016  ? Fibromyalgia 01/29/2016  ? Hiatal hernia with GERD 01/29/2016  ? History of bleeding peptic ulcer 10/30/2017  ? History of pulmonary embolism 01/29/2016  ?  Overview:  x2  ? Hyperlipidemia   ? Hypertension 01/29/2016  ? Hypokalemia   ? Normocytic anemia 10/30/2017  ? O2 dependent   ? Occlusion of right femoral-popliteal bypass graft ([place]) 10/30/2017  ? PONV (postoperative nausea and vomiting)   ? Pulmonary embolism ([place]) 09/13/2020  ? Formatting of this note might be different from the original. x2  ?  Septic arthritis of knee, right ([place]) 10/30/2017  ? Stroke ([place]) 01/29/2016  ? Overview:  Date of stroke is unknown.  "cerebellum"  ? Tobacco abuse   ? Urgency incontinence 01/29/2016  ? ? ?Past Surgical History:  ?Procedure Laterality Date  ? ABDOMINAL AORTOGRAM W/LOWER EXTREMITY Right 11/01/2017  ? Ultrasound-guided cannulation left common femoral artery  ? ABDOMINAL AORTOGRAM W/LOWER EXTREMITY N/A 11/01/2017  ? Procedure: ABDOMINAL AORTOGRAM W/LOWER EXTREMITY;  Surgeon: Waynetta Sandy, MD;  Location: [facility];  Service: Cardiovascular;  Laterality: N/A;  ? ABDOMINAL HYSTERECTOMY    ? FOOT SURGERY Bilateral   ? HEMORROIDECTOMY    ? LUNG LOBECTOMY  02/23/2006  ? DR.BURNEY  ? REVISION TOTAL HIP ARTHROPLASTY    ? TEE WITHOUT CARDIOVERSION N/A 11/10/2017  ? Procedure: TRANSESOPHAGEAL ECHOCARDIOGRAM (TEE);  Surgeon: Acie Fredrickson Wonda Cheng, MD;  Location: [facility] ENDOSCOPY;  Service: Cardiovascular;  Laterality: N/A;  ? THORACOTOMY  02/23/2006  ? DR.BURNEY  ? TIBIA FRACTURE SURGERY    ? ? ?Current Medications: ?Current Meds  ?Medication Sig  ? albuterol (VENTOLIN HFA) 108 (90 Base) MCG/ACT inhaler Inhale 2 puffs into the lungs every 6 (six) hours as needed for wheezing or shortness of breath.  ? apixaban (ELIQUIS) 5 MG TABS tablet Take 1 tablet (5 mg total) by mouth 2 (two) times daily.  ? atorvastatin (LIPITOR) 20 MG tablet Take 20 mg by mouth at bedtime.  ? budesonide (PULMICORT) 0.5 MG/2ML nebulizer solution Take 0.5 mg by nebulization as needed for wheezing or shortness of breath.  ? busPIRone (BUSPAR) 7.5 MG tablet Take 7.5 mg by mouth 2 (two) times daily.  ? Calcium Carbonate (CALCIUM 500 PO) Take 500 mg by mouth daily.  ? clopidogrel (PLAVIX) 75 MG tablet Take 75 mg by mouth daily.  ? Cyanocobalamin (B-12 PO) Take 1 capsule by mouth daily.  ? dicyclomine (BENTYL) 20 MG tablet Take 20 mg by mouth 3 (three) times daily as needed for spasms.   ? diltiazem (CARDIZEM) 120 MG tablet Take 120 mg by mouth  daily.  ? diltiazem (CARDIZEM) 30 MG tablet Take 30 mg by mouth as needed (heart rate above 120).  ? Ferrous Sulfate (IRON PO) Take 1 tablet by mouth daily.  ? fluticasone (FLONASE) 50 MCG/ACT nasal spray Place 1 spray into both nostrils 2 (two) times daily.  ? furosemide (LASIX) 40 MG tablet Take 40 mg by mouth daily.  ? gabapentin (NEURONTIN) 300 MG capsule Take 300 mg by mouth 2 (two) times daily.  ? ipratropium-albuterol (DUONEB) 0.5-2.5 (3) MG/3ML SOLN Take 3 mLs by nebulization 4 (four) times daily as needed (shortness of breath).  ? LORazepam (ATIVAN) 0.5 MG tablet Take 0.5 mg by mouth daily as needed for anxiety.  ? meclizine (ANTIVERT) 25 MG tablet Take 25 mg by mouth 2 (two) times daily as needed for dizziness.  ? metoprolol tartrate (LOPRESSOR) 50 MG tablet Take 75 mg by mouth in the morning and at bedtime. And takes 50 mg in the evening  ? mirtazapine (REMERON) 15 MG tablet Take 15 mg by mouth at bedtime.  ? montelukast (SINGULAIR) 10 MG tablet Take  10 mg by mouth daily.  ? Multiple Vitamins-Minerals (PRESERVISION AREDS) CAPS Take 1 capsule by mouth daily.  ? ondansetron (ZOFRAN) 4 MG tablet Take 4 mg by mouth 4 (four) times daily as needed for nausea.  ? OXYCONTIN 15 MG 12 hr tablet Take 15 mg by mouth 3 (three) times daily.  ? pantoprazole (PROTONIX) 40 MG tablet Take 40 mg by mouth daily.   ? potassium chloride SA (KLOR-CON M) 20 MEQ tablet Take 20 mEq by mouth daily.  ? sucralfate (CARAFATE) 1 g tablet Take 1 g by mouth 4 (four) times daily.  ?  ? ?Allergies:   Ambien [zolpidem], Aspirin, Codeine, Erythromycin, Ibuprofen, and Penicillins  ? ?Social History  ? ?Socioeconomic History  ? Marital status: Divorced  ?  Spouse name: Not on file  ? Number of children: 1  ? Years of education: 10  ? Highest education level: Not on file  ?Occupational History  ? Occupation: N/A  ?Tobacco Use  ? Smoking status: Every Day  ?  Packs/day: 0.50  ?  Types: Cigarettes  ? Smokeless tobacco: Never  ? Tobacco comments:   ?  smoking sinceshewas 16  ?Vaping Use  ? Vaping Use: Never used  ?Substance and Sexual Activity  ? Alcohol use: Yes  ?  Comment: Couple of drinks per day  ? Drug use: No  ? Sexual activity: Not on file  ?Other

## 2021-07-21 NOTE — Addendum Note (Signed)
Addended by: Truddie Hidden on: 07/21/2021 04:07 PM ? ? Modules accepted: Orders ? ?

## 2021-07-22 DIAGNOSIS — I5031 Acute diastolic (congestive) heart failure: Secondary | ICD-10-CM | POA: Diagnosis not present

## 2021-07-22 DIAGNOSIS — J449 Chronic obstructive pulmonary disease, unspecified: Secondary | ICD-10-CM | POA: Diagnosis not present

## 2021-07-22 DIAGNOSIS — J90 Pleural effusion, not elsewhere classified: Secondary | ICD-10-CM | POA: Diagnosis not present

## 2021-07-22 DIAGNOSIS — R040 Epistaxis: Secondary | ICD-10-CM | POA: Diagnosis not present

## 2021-07-22 LAB — CBC WITH DIFFERENTIAL/PLATELET
Basophils Absolute: 0 10*3/uL (ref 0.0–0.2)
Basos: 0 %
EOS (ABSOLUTE): 0.1 10*3/uL (ref 0.0–0.4)
Eos: 1 %
Hematocrit: 34 % (ref 34.0–46.6)
Hemoglobin: 10.5 g/dL — ABNORMAL LOW (ref 11.1–15.9)
Immature Grans (Abs): 0 10*3/uL (ref 0.0–0.1)
Immature Granulocytes: 0 %
Lymphocytes Absolute: 1.3 10*3/uL (ref 0.7–3.1)
Lymphs: 14 %
MCH: 28.9 pg (ref 26.6–33.0)
MCHC: 30.9 g/dL — ABNORMAL LOW (ref 31.5–35.7)
MCV: 94 fL (ref 79–97)
Monocytes Absolute: 0.5 10*3/uL (ref 0.1–0.9)
Monocytes: 6 %
Neutrophils Absolute: 7.4 10*3/uL — ABNORMAL HIGH (ref 1.4–7.0)
Neutrophils: 79 %
Platelets: 264 10*3/uL (ref 150–450)
RBC: 3.63 x10E6/uL — ABNORMAL LOW (ref 3.77–5.28)
RDW: 12.7 % (ref 11.7–15.4)
WBC: 9.5 10*3/uL (ref 3.4–10.8)

## 2021-07-22 LAB — BASIC METABOLIC PANEL
BUN/Creatinine Ratio: 14 (ref 12–28)
BUN: 12 mg/dL (ref 8–27)
CO2: 32 mmol/L — ABNORMAL HIGH (ref 20–29)
Calcium: 9.3 mg/dL (ref 8.7–10.3)
Chloride: 97 mmol/L (ref 96–106)
Creatinine, Ser: 0.86 mg/dL (ref 0.57–1.00)
Glucose: 86 mg/dL (ref 70–99)
Potassium: 3.9 mmol/L (ref 3.5–5.2)
Sodium: 141 mmol/L (ref 134–144)
eGFR: 66 mL/min/{1.73_m2} (ref >59)

## 2021-07-29 ENCOUNTER — Other Ambulatory Visit: Payer: Self-pay | Admitting: *Deleted

## 2021-07-29 DIAGNOSIS — G9341 Metabolic encephalopathy: Secondary | ICD-10-CM | POA: Diagnosis not present

## 2021-07-29 DIAGNOSIS — I110 Hypertensive heart disease with heart failure: Secondary | ICD-10-CM | POA: Diagnosis not present

## 2021-07-29 DIAGNOSIS — J90 Pleural effusion, not elsewhere classified: Secondary | ICD-10-CM | POA: Diagnosis not present

## 2021-07-29 DIAGNOSIS — R0689 Other abnormalities of breathing: Secondary | ICD-10-CM | POA: Diagnosis not present

## 2021-07-29 DIAGNOSIS — Z888 Allergy status to other drugs, medicaments and biological substances status: Secondary | ICD-10-CM | POA: Diagnosis not present

## 2021-07-29 DIAGNOSIS — Z8673 Personal history of transient ischemic attack (TIA), and cerebral infarction without residual deficits: Secondary | ICD-10-CM | POA: Diagnosis not present

## 2021-07-29 DIAGNOSIS — J9690 Respiratory failure, unspecified, unspecified whether with hypoxia or hypercapnia: Secondary | ICD-10-CM | POA: Diagnosis not present

## 2021-07-29 DIAGNOSIS — E876 Hypokalemia: Secondary | ICD-10-CM | POA: Diagnosis not present

## 2021-07-29 DIAGNOSIS — Z7952 Long term (current) use of systemic steroids: Secondary | ICD-10-CM | POA: Diagnosis not present

## 2021-07-29 DIAGNOSIS — M1990 Unspecified osteoarthritis, unspecified site: Secondary | ICD-10-CM | POA: Diagnosis not present

## 2021-07-29 DIAGNOSIS — R42 Dizziness and giddiness: Secondary | ICD-10-CM | POA: Diagnosis not present

## 2021-07-29 DIAGNOSIS — F32A Depression, unspecified: Secondary | ICD-10-CM | POA: Diagnosis not present

## 2021-07-29 DIAGNOSIS — Z885 Allergy status to narcotic agent status: Secondary | ICD-10-CM | POA: Diagnosis not present

## 2021-07-29 DIAGNOSIS — Z79899 Other long term (current) drug therapy: Secondary | ICD-10-CM | POA: Diagnosis not present

## 2021-07-29 DIAGNOSIS — Z881 Allergy status to other antibiotic agents status: Secondary | ICD-10-CM | POA: Diagnosis not present

## 2021-07-29 DIAGNOSIS — Z86711 Personal history of pulmonary embolism: Secondary | ICD-10-CM | POA: Diagnosis not present

## 2021-07-29 DIAGNOSIS — R0602 Shortness of breath: Secondary | ICD-10-CM | POA: Diagnosis not present

## 2021-07-29 DIAGNOSIS — Z9981 Dependence on supplemental oxygen: Secondary | ICD-10-CM | POA: Diagnosis not present

## 2021-07-29 DIAGNOSIS — G894 Chronic pain syndrome: Secondary | ICD-10-CM | POA: Diagnosis not present

## 2021-07-29 DIAGNOSIS — Z7902 Long term (current) use of antithrombotics/antiplatelets: Secondary | ICD-10-CM | POA: Diagnosis not present

## 2021-07-29 DIAGNOSIS — R0902 Hypoxemia: Secondary | ICD-10-CM | POA: Diagnosis not present

## 2021-07-29 DIAGNOSIS — J9601 Acute respiratory failure with hypoxia: Secondary | ICD-10-CM | POA: Diagnosis not present

## 2021-07-29 DIAGNOSIS — F17210 Nicotine dependence, cigarettes, uncomplicated: Secondary | ICD-10-CM | POA: Diagnosis not present

## 2021-07-29 DIAGNOSIS — J9621 Acute and chronic respiratory failure with hypoxia: Secondary | ICD-10-CM | POA: Diagnosis not present

## 2021-07-29 DIAGNOSIS — J441 Chronic obstructive pulmonary disease with (acute) exacerbation: Secondary | ICD-10-CM | POA: Diagnosis not present

## 2021-07-29 DIAGNOSIS — I700 Atherosclerosis of aorta: Secondary | ICD-10-CM | POA: Diagnosis not present

## 2021-07-29 DIAGNOSIS — I4891 Unspecified atrial fibrillation: Secondary | ICD-10-CM | POA: Diagnosis not present

## 2021-07-29 DIAGNOSIS — I5032 Chronic diastolic (congestive) heart failure: Secondary | ICD-10-CM | POA: Diagnosis not present

## 2021-07-29 DIAGNOSIS — J9811 Atelectasis: Secondary | ICD-10-CM | POA: Diagnosis not present

## 2021-07-29 DIAGNOSIS — R000 Tachycardia, unspecified: Secondary | ICD-10-CM | POA: Diagnosis not present

## 2021-07-29 DIAGNOSIS — E785 Hyperlipidemia, unspecified: Secondary | ICD-10-CM | POA: Diagnosis not present

## 2021-07-29 DIAGNOSIS — J80 Acute respiratory distress syndrome: Secondary | ICD-10-CM | POA: Diagnosis not present

## 2021-07-29 DIAGNOSIS — R7401 Elevation of levels of liver transaminase levels: Secondary | ICD-10-CM | POA: Diagnosis not present

## 2021-07-29 NOTE — Patient Outreach (Signed)
[facility]) Care Management ? ?07/29/2021 ? ?Courtney Grant ?03-May-1932 ?[id number] ? ? ?Telephone Assessment ? ?Pending from last conversation in April. Pt pending possible LTC at [facility]. ? ?RN spoke with the pt's daughter today Courtney Grant who reports pt is currently in the ED at [facility] and will be admitted. States pt has been at a facility for the last 2 weeks and she is not sure pt will return to the facility or not.  ? ?RN will follow up accordingly concerning pt's discharge disposition for ongoing [facility] services. ? ?[name], RN ?Care Management Coordinator ?[facility] ?Main Office [phone]  ?

## 2021-07-30 DIAGNOSIS — I4891 Unspecified atrial fibrillation: Secondary | ICD-10-CM | POA: Diagnosis not present

## 2021-08-13 DIAGNOSIS — R609 Edema, unspecified: Secondary | ICD-10-CM | POA: Diagnosis not present

## 2021-08-13 DIAGNOSIS — I110 Hypertensive heart disease with heart failure: Secondary | ICD-10-CM | POA: Diagnosis not present

## 2021-08-13 DIAGNOSIS — J440 Chronic obstructive pulmonary disease with acute lower respiratory infection: Secondary | ICD-10-CM | POA: Diagnosis not present

## 2021-08-13 DIAGNOSIS — R000 Tachycardia, unspecified: Secondary | ICD-10-CM | POA: Diagnosis not present

## 2021-08-13 DIAGNOSIS — Z7901 Long term (current) use of anticoagulants: Secondary | ICD-10-CM | POA: Diagnosis not present

## 2021-08-13 DIAGNOSIS — M549 Dorsalgia, unspecified: Secondary | ICD-10-CM | POA: Diagnosis not present

## 2021-08-13 DIAGNOSIS — J449 Chronic obstructive pulmonary disease, unspecified: Secondary | ICD-10-CM | POA: Diagnosis not present

## 2021-08-13 DIAGNOSIS — I5032 Chronic diastolic (congestive) heart failure: Secondary | ICD-10-CM | POA: Diagnosis not present

## 2021-08-13 DIAGNOSIS — R269 Unspecified abnormalities of gait and mobility: Secondary | ICD-10-CM | POA: Diagnosis not present

## 2021-08-13 DIAGNOSIS — R079 Chest pain, unspecified: Secondary | ICD-10-CM | POA: Diagnosis not present

## 2021-08-13 DIAGNOSIS — I482 Chronic atrial fibrillation, unspecified: Secondary | ICD-10-CM | POA: Diagnosis not present

## 2021-08-13 DIAGNOSIS — I4891 Unspecified atrial fibrillation: Secondary | ICD-10-CM | POA: Diagnosis not present

## 2021-08-13 DIAGNOSIS — J90 Pleural effusion, not elsewhere classified: Secondary | ICD-10-CM | POA: Diagnosis not present

## 2021-08-13 DIAGNOSIS — I499 Cardiac arrhythmia, unspecified: Secondary | ICD-10-CM | POA: Diagnosis not present

## 2021-08-13 DIAGNOSIS — M797 Fibromyalgia: Secondary | ICD-10-CM | POA: Diagnosis not present

## 2021-08-13 DIAGNOSIS — G894 Chronic pain syndrome: Secondary | ICD-10-CM | POA: Diagnosis not present

## 2021-08-13 DIAGNOSIS — R0600 Dyspnea, unspecified: Secondary | ICD-10-CM | POA: Diagnosis not present

## 2021-08-13 DIAGNOSIS — J9611 Chronic respiratory failure with hypoxia: Secondary | ICD-10-CM | POA: Diagnosis not present

## 2021-08-13 DIAGNOSIS — F17210 Nicotine dependence, cigarettes, uncomplicated: Secondary | ICD-10-CM | POA: Diagnosis not present

## 2021-08-13 DIAGNOSIS — R58 Hemorrhage, not elsewhere classified: Secondary | ICD-10-CM | POA: Diagnosis not present

## 2021-08-13 DIAGNOSIS — Z85118 Personal history of other malignant neoplasm of bronchus and lung: Secondary | ICD-10-CM | POA: Diagnosis not present

## 2021-08-13 DIAGNOSIS — K2270 Barrett's esophagus without dysplasia: Secondary | ICD-10-CM | POA: Diagnosis not present

## 2021-08-13 DIAGNOSIS — R0902 Hypoxemia: Secondary | ICD-10-CM | POA: Diagnosis not present

## 2021-08-13 DIAGNOSIS — Z9981 Dependence on supplemental oxygen: Secondary | ICD-10-CM | POA: Diagnosis not present

## 2021-08-13 DIAGNOSIS — Z86711 Personal history of pulmonary embolism: Secondary | ICD-10-CM | POA: Diagnosis not present

## 2021-08-13 DIAGNOSIS — E785 Hyperlipidemia, unspecified: Secondary | ICD-10-CM | POA: Diagnosis not present

## 2021-08-14 DIAGNOSIS — K219 Gastro-esophageal reflux disease without esophagitis: Secondary | ICD-10-CM | POA: Diagnosis not present

## 2021-08-14 DIAGNOSIS — M1990 Unspecified osteoarthritis, unspecified site: Secondary | ICD-10-CM | POA: Diagnosis not present

## 2021-08-14 DIAGNOSIS — J9611 Chronic respiratory failure with hypoxia: Secondary | ICD-10-CM | POA: Diagnosis not present

## 2021-08-14 DIAGNOSIS — E785 Hyperlipidemia, unspecified: Secondary | ICD-10-CM | POA: Diagnosis not present

## 2021-08-14 DIAGNOSIS — G8929 Other chronic pain: Secondary | ICD-10-CM | POA: Diagnosis not present

## 2021-08-14 DIAGNOSIS — Z87440 Personal history of urinary (tract) infections: Secondary | ICD-10-CM | POA: Diagnosis not present

## 2021-08-14 DIAGNOSIS — J90 Pleural effusion, not elsewhere classified: Secondary | ICD-10-CM | POA: Diagnosis not present

## 2021-08-14 DIAGNOSIS — Z792 Long term (current) use of antibiotics: Secondary | ICD-10-CM | POA: Diagnosis not present

## 2021-08-14 DIAGNOSIS — R0902 Hypoxemia: Secondary | ICD-10-CM | POA: Diagnosis not present

## 2021-08-14 DIAGNOSIS — F17210 Nicotine dependence, cigarettes, uncomplicated: Secondary | ICD-10-CM | POA: Diagnosis not present

## 2021-08-14 DIAGNOSIS — Z885 Allergy status to narcotic agent status: Secondary | ICD-10-CM | POA: Diagnosis not present

## 2021-08-14 DIAGNOSIS — Z86711 Personal history of pulmonary embolism: Secondary | ICD-10-CM | POA: Diagnosis not present

## 2021-08-14 DIAGNOSIS — J449 Chronic obstructive pulmonary disease, unspecified: Secondary | ICD-10-CM | POA: Diagnosis not present

## 2021-08-14 DIAGNOSIS — Z8673 Personal history of transient ischemic attack (TIA), and cerebral infarction without residual deficits: Secondary | ICD-10-CM | POA: Diagnosis not present

## 2021-08-14 DIAGNOSIS — I5032 Chronic diastolic (congestive) heart failure: Secondary | ICD-10-CM | POA: Diagnosis not present

## 2021-08-14 DIAGNOSIS — E876 Hypokalemia: Secondary | ICD-10-CM | POA: Diagnosis not present

## 2021-08-14 DIAGNOSIS — F32A Depression, unspecified: Secondary | ICD-10-CM | POA: Diagnosis not present

## 2021-08-14 DIAGNOSIS — R0600 Dyspnea, unspecified: Secondary | ICD-10-CM | POA: Diagnosis not present

## 2021-08-14 DIAGNOSIS — J441 Chronic obstructive pulmonary disease with (acute) exacerbation: Secondary | ICD-10-CM | POA: Diagnosis not present

## 2021-08-14 DIAGNOSIS — I110 Hypertensive heart disease with heart failure: Secondary | ICD-10-CM | POA: Diagnosis not present

## 2021-08-14 DIAGNOSIS — Z7901 Long term (current) use of anticoagulants: Secondary | ICD-10-CM | POA: Diagnosis not present

## 2021-08-14 DIAGNOSIS — Z9889 Other specified postprocedural states: Secondary | ICD-10-CM | POA: Diagnosis not present

## 2021-08-14 DIAGNOSIS — Z7952 Long term (current) use of systemic steroids: Secondary | ICD-10-CM | POA: Diagnosis not present

## 2021-08-14 DIAGNOSIS — Z886 Allergy status to analgesic agent status: Secondary | ICD-10-CM | POA: Diagnosis not present

## 2021-08-14 DIAGNOSIS — R079 Chest pain, unspecified: Secondary | ICD-10-CM | POA: Diagnosis not present

## 2021-08-14 DIAGNOSIS — I4819 Other persistent atrial fibrillation: Secondary | ICD-10-CM | POA: Diagnosis not present

## 2021-08-14 DIAGNOSIS — N179 Acute kidney failure, unspecified: Secondary | ICD-10-CM | POA: Diagnosis not present

## 2021-08-14 DIAGNOSIS — E871 Hypo-osmolality and hyponatremia: Secondary | ICD-10-CM | POA: Diagnosis not present

## 2021-08-14 DIAGNOSIS — I482 Chronic atrial fibrillation, unspecified: Secondary | ICD-10-CM | POA: Diagnosis not present

## 2021-08-14 DIAGNOSIS — J440 Chronic obstructive pulmonary disease with acute lower respiratory infection: Secondary | ICD-10-CM | POA: Diagnosis not present

## 2021-08-31 DIAGNOSIS — J9611 Chronic respiratory failure with hypoxia: Secondary | ICD-10-CM | POA: Diagnosis not present

## 2021-08-31 DIAGNOSIS — R262 Difficulty in walking, not elsewhere classified: Secondary | ICD-10-CM | POA: Diagnosis not present

## 2021-08-31 DIAGNOSIS — I5032 Chronic diastolic (congestive) heart failure: Secondary | ICD-10-CM | POA: Diagnosis not present

## 2021-09-03 ENCOUNTER — Other Ambulatory Visit: Payer: Self-pay | Admitting: *Deleted

## 2021-09-03 NOTE — Patient Outreach (Signed)
[place] [facility]) Care Management Telephonic RN Care Manager Note   09/03/2021 Name:  Courtney Grant MRN:  [id number] DOB:  1933-01-19  Summary: Case will be closed due to external practice available for case management needs.  Recommendations/Changes made from today's visit: Referral information provided to practice coordinator.  Subjective: Courtney Grant is an 88 y.o. year old female who is a primary patient of Garwin Brothers, MD. The care management team was consulted for assistance with care management and/or care coordination needs.    Telephonic RN Care Manager completed Telephone Visit today.  Objective:   Medications Reviewed Today     Reviewed by Toni Arthurs, CMA (Certified Medical Assistant) on 07/21/21 at 1541  Med List Status: <None>   Medication Order Taking? Sig Documenting Provider Last Dose Status Informant  albuterol (VENTOLIN HFA) 108 (90 Base) MCG/ACT inhaler [id number] Yes Inhale 2 puffs into the lungs every 6 (six) hours as needed for wheezing or shortness of breath. [provider] Taking Active   apixaban (ELIQUIS) 5 MG TABS tablet [id number] Yes Take 1 tablet (5 mg total) by mouth 2 (two) times daily. Revankar, Reita Cliche, MD Taking Active   atorvastatin (LIPITOR) 20 MG tablet [id number] Yes Take 20 mg by mouth at bedtime. [provider] Taking Active Family Member  budesonide (PULMICORT) 0.5 MG/2ML nebulizer solution [id number] Yes Take 0.5 mg by nebulization as needed for wheezing or shortness of breath. [provider] Taking Active   busPIRone (BUSPAR) 7.5 MG tablet [id number] Yes Take 7.5 mg by mouth 2 (two) times daily. [provider] Taking Active Family Member  Calcium Carbonate (CALCIUM 500 PO) [id number] Yes Take 500 mg by mouth daily. [provider] Taking Active   clopidogrel (PLAVIX) 75 MG tablet [id number] Yes Take 75 mg by mouth daily. [provider] Taking Active   Cyanocobalamin (B-12  PO) [id number] Yes Take 1 capsule by mouth daily. [provider] Taking Active   dicyclomine (BENTYL) 20 MG tablet [id number] Yes Take 20 mg by mouth 3 (three) times daily as needed for spasms.  [provider] Taking Active Family Member           Med Note Lonna Cobb   Sun Oct 31, 2017 11:16 AM)    diltiazem (CARDIZEM) 120 MG tablet [id number] Yes Take 120 mg by mouth daily. [provider] Taking Active   diltiazem (CARDIZEM) 30 MG tablet [id number] Yes Take 30 mg by mouth as needed (heart rate above 120). [provider] Taking Active   Ferrous Sulfate (IRON PO) [id number] Yes Take 1 tablet by mouth daily. [provider] Taking Active   fluticasone (FLONASE) 50 MCG/ACT nasal spray [id number] Yes Place 1 spray into both nostrils 2 (two) times daily. [provider] Taking Active   furosemide (LASIX) 40 MG tablet [id number] Yes Take 40 mg by mouth daily. [provider] Taking Active   gabapentin (NEURONTIN) 300 MG capsule [id number] Yes Take 300 mg by mouth 2 (two) times daily. [provider] Taking Active   ipratropium-albuterol (DUONEB) 0.5-2.5 (3) MG/3ML SOLN [id number] Yes Take 3 mLs by nebulization 4 (four) times daily as needed (shortness of breath). [provider] Taking Active   LORazepam (ATIVAN) 0.5 MG tablet [id number] Yes Take 0.5 mg by mouth daily as needed for anxiety. [provider] Taking Active   meclizine (ANTIVERT) 25 MG tablet [id number] Yes Take 25 mg by mouth 2 (two) times daily as needed for dizziness. [provider] Taking Active  metoprolol tartrate (LOPRESSOR) 50 MG tablet [id number] Yes Take 75 mg by mouth in the morning and at bedtime. And takes 50 mg in the evening [provider] Taking Active   mirtazapine (REMERON) 15 MG tablet [id number] Yes Take 15 mg by mouth at bedtime. [provider] Taking Active   montelukast (SINGULAIR) 10 MG tablet [id number]  Yes Take 10 mg by mouth daily. [provider] Taking Active   Multiple Vitamins-Minerals (PRESERVISION AREDS) CAPS [id number] Yes Take 1 capsule by mouth daily. [provider] Taking Active   ondansetron (ZOFRAN) 4 MG tablet [id number] Yes Take 4 mg by mouth 4 (four) times daily as needed for nausea. [provider] Taking Active   OXYCONTIN 15 MG 12 hr tablet [id number] Yes Take 15 mg by mouth 3 (three) times daily. [provider] Taking Active   pantoprazole (PROTONIX) 40 MG tablet [id number] Yes Take 40 mg by mouth daily.  [provider] Taking Active Family Member           Med Note Eddie North Oct 31, 2017 11:23 AM)    potassium chloride SA (KLOR-CON M) 20 MEQ tablet [id number] Yes Take 20 mEq by mouth daily. [provider] Taking Active   sucralfate (CARAFATE) 1 g tablet [id number] Yes Take 1 g by mouth 4 (four) times daily. [provider] Taking Active              SDOH:  (Social Determinants of Health) assessments and interventions performed:     Care Plan  Review of patient past medical history, allergies, medications, health status, including review of consultants reports, laboratory and other test data, was performed as part of comprehensive evaluation for care management services.   Care Plan : RN Care Manager Plan of Care  Updates made by Tobi Bastos, RN since 09/03/2021 12:00 AM     Problem: Knowledge Deficit Related to COPD management   Priority: High     Long-Range Goal: Development of Plan of Care for managing COPD Completed 09/03/2021  Start Date: 05/01/2021  Expected End Date: 10/13/2021  Recent Progress: On track  Priority: High  Note:   Current Barriers:  Knowledge Deficits related to plan of care for management of COPD   RNCM Clinical Goal(s):  Patient will verbalize understanding of plan for management of COPD as evidenced by self/caregiver reporting verbalize basic  understanding of  COPD disease process and self health management plan as evidenced by self/caregiver reporting take all medications exactly as prescribed and will call provider for medication related questions as evidenced by caregiver verification of pt's adherence with medication administration and through collaboration with RN Care manager, provider, and care team.   Interventions: Inter-disciplinary care team collaboration (see longitudinal plan of care) Evaluation of current treatment plan related to  self management and patient's adherence to plan as established by provider   COPD Interventions:  (Status:  Goal on track:  Yes.) Long Term Goal Provided patient with basic written and verbal COPD education on self care/management/and exacerbation prevention Advised patient to track and manage COPD triggers Provided instruction about proper use of medications used for management of COPD including inhalers Advised patient to self assesses COPD action plan zone and make appointment with provider if in the yellow zone for 48 hours without improvement Advised patient to engage in light exercise as tolerated 3-5 days a week to aid in the the management of COPD Provided education about and advised patient to utilize infection prevention strategies  to reduce risk of respiratory infection Screening for signs and symptoms of depression related to chronic disease state  Assessed social determinant of health barriers  3/16 Update: Pt reports she is doing well with no acute issues with her breathing. Reports ongoing use of her inhalers and nebulizer therapy. Pt remains on 2 liters however has issues nose bleeds due to the oxygen. Pt not sure what agency supplies her O2. RN encouraged pt to speak with her daughter to request possible humidification to the concentrator to assist with increase moisture to her nasal to possible assist with her dry nostrils.also lubrication will also assist. Pt remains in the  GREEN zone with no acute issues or problems mentioned. No needs presented at this time.   06/27/2021 Update: Pt continues to do well with no acute issues. Pt states she continues to struggle with nose bleeds however resolved fairly quickly with applied pressures (provided aware). Pt remains on 2 1/2 liters home O2 and now uses a indoors wheelchair for mobility in the home. Denies any falls or injuries and pt continue to adherence to the discussed plan of care. Pt now uses a humidifier as discussed and recommended from last conversation to assist with added moisture to the room. Pt has expressed possible placement in long term care facility and reports she has lives at [facility] three times and wishes to return to this facility. States her daughter works and not available until 5 pm. Encouraged pt to discuss her thoughts with her family and reach out the the facility of choice for possible beds that maybe available. Discussed provider completing an FL2 as pt familiar with this form for placement. Other issues related to te price of Eliquis, offered assist via [employer] for possible available coupons (receptive). Will follow up next month on pt's progress with possible placement to LTC.  Patient Goals/Self-Care Activities: Take all medications as prescribed Call pharmacy for medication refills 3-7 days in advance of running out of medications Attend church or other social activities Perform all self care activities independently  Perform IADL's (shopping, preparing meals, housekeeping, managing finances) independently Call provider office for new concerns or questions  eliminate smoking in my home identify and avoid work-related triggers identify and remove indoor air pollutants develop a rescue plan eliminate symptom triggers at home use an extra pillow to sleep don't eat or exercise right before bedtime eat healthy/prescribed diet: Heart Healthy get at least 7 to 8 hours of sleep at night use  devices that will help like a cane, sock-puller or reacher practice relaxation or meditation daily do breathing exercises every day do breathing exercises at least 2 times each day do exercises in a comfortable position that makes breathing as easy as possible  Follow Up Plan:  Telephone follow up appointment with care management team member scheduled for:  May 2023 The patient has been provided with contact information for the care management team and has been advised to call with any health related questions or concerns.    Case closure with another case management program     [name], RN Care Management Coordinator [place] Office [phone]

## 2021-09-18 DIAGNOSIS — J449 Chronic obstructive pulmonary disease, unspecified: Secondary | ICD-10-CM | POA: Diagnosis not present

## 2021-10-19 DIAGNOSIS — J449 Chronic obstructive pulmonary disease, unspecified: Secondary | ICD-10-CM | POA: Diagnosis not present

## 2021-10-24 ENCOUNTER — Other Ambulatory Visit: Payer: Self-pay

## 2021-10-24 NOTE — Patient Outreach (Signed)
  Care Coordination   Outreach  Visit Note   10/24/2021 Name: LASHAWNNA LAMBRECHT MRN: [id number] DOB: 1933/01/17  LINNELL SWORDS is a 88 y.o. year old female who sees Garwin Brothers, MD for primary care. I spoke with  [name] by phone today  What matters to the patients health and wellness today?  Placed call to patient to offer [facility] care coordination program.  Review benefits of nurse case manager, social worker and pharmacist. Patient reports that she is living with her daughter and son in law. Reports she has nurses coming to her home. Reminded patient of the need for an AWV.  Patient reports she is unsure if she needs this service. Request a call back in the afternoons to speak with daught.    SDOH assessments and interventions completed:       Care Coordination Interventions Activated:  No  Care Coordination Interventions:  No, not indicated   Follow up plan: will reach out another day to speak with daughter  Encounter Outcome:  Pt. Request to Call Back   Tomasa Rand, RN, BSN, CEN [place] Coordinator [phone]

## 2021-11-06 DIAGNOSIS — Z85118 Personal history of other malignant neoplasm of bronchus and lung: Secondary | ICD-10-CM | POA: Diagnosis not present

## 2021-11-06 DIAGNOSIS — Z87440 Personal history of urinary (tract) infections: Secondary | ICD-10-CM | POA: Diagnosis not present

## 2021-11-06 DIAGNOSIS — I499 Cardiac arrhythmia, unspecified: Secondary | ICD-10-CM | POA: Diagnosis not present

## 2021-11-06 DIAGNOSIS — Z9981 Dependence on supplemental oxygen: Secondary | ICD-10-CM | POA: Diagnosis not present

## 2021-11-06 DIAGNOSIS — R0602 Shortness of breath: Secondary | ICD-10-CM | POA: Diagnosis not present

## 2021-11-06 DIAGNOSIS — J9811 Atelectasis: Secondary | ICD-10-CM | POA: Diagnosis not present

## 2021-11-06 DIAGNOSIS — E119 Type 2 diabetes mellitus without complications: Secondary | ICD-10-CM | POA: Diagnosis not present

## 2021-11-06 DIAGNOSIS — I10 Essential (primary) hypertension: Secondary | ICD-10-CM | POA: Diagnosis not present

## 2021-11-06 DIAGNOSIS — D649 Anemia, unspecified: Secondary | ICD-10-CM | POA: Diagnosis not present

## 2021-11-06 DIAGNOSIS — G894 Chronic pain syndrome: Secondary | ICD-10-CM | POA: Diagnosis not present

## 2021-11-06 DIAGNOSIS — I110 Hypertensive heart disease with heart failure: Secondary | ICD-10-CM | POA: Diagnosis not present

## 2021-11-06 DIAGNOSIS — J918 Pleural effusion in other conditions classified elsewhere: Secondary | ICD-10-CM | POA: Diagnosis not present

## 2021-11-06 DIAGNOSIS — J441 Chronic obstructive pulmonary disease with (acute) exacerbation: Secondary | ICD-10-CM | POA: Diagnosis not present

## 2021-11-06 DIAGNOSIS — I700 Atherosclerosis of aorta: Secondary | ICD-10-CM | POA: Diagnosis not present

## 2021-11-06 DIAGNOSIS — I4891 Unspecified atrial fibrillation: Secondary | ICD-10-CM | POA: Diagnosis not present

## 2021-11-06 DIAGNOSIS — Z743 Need for continuous supervision: Secondary | ICD-10-CM | POA: Diagnosis not present

## 2021-11-06 DIAGNOSIS — R188 Other ascites: Secondary | ICD-10-CM | POA: Diagnosis not present

## 2021-11-06 DIAGNOSIS — J9621 Acute and chronic respiratory failure with hypoxia: Secondary | ICD-10-CM | POA: Diagnosis not present

## 2021-11-06 DIAGNOSIS — R000 Tachycardia, unspecified: Secondary | ICD-10-CM | POA: Diagnosis not present

## 2021-11-06 DIAGNOSIS — I509 Heart failure, unspecified: Secondary | ICD-10-CM | POA: Diagnosis not present

## 2021-11-06 DIAGNOSIS — J90 Pleural effusion, not elsewhere classified: Secondary | ICD-10-CM | POA: Diagnosis not present

## 2021-11-06 DIAGNOSIS — R531 Weakness: Secondary | ICD-10-CM | POA: Diagnosis not present

## 2021-11-06 DIAGNOSIS — Z7901 Long term (current) use of anticoagulants: Secondary | ICD-10-CM | POA: Diagnosis not present

## 2021-11-06 DIAGNOSIS — Z86711 Personal history of pulmonary embolism: Secondary | ICD-10-CM | POA: Diagnosis not present

## 2021-11-06 DIAGNOSIS — F32A Depression, unspecified: Secondary | ICD-10-CM | POA: Diagnosis not present

## 2021-11-06 DIAGNOSIS — I5033 Acute on chronic diastolic (congestive) heart failure: Secondary | ICD-10-CM | POA: Diagnosis not present

## 2021-11-06 DIAGNOSIS — Z66 Do not resuscitate: Secondary | ICD-10-CM | POA: Diagnosis not present

## 2021-11-06 DIAGNOSIS — F17210 Nicotine dependence, cigarettes, uncomplicated: Secondary | ICD-10-CM | POA: Diagnosis not present

## 2021-11-06 DIAGNOSIS — J449 Chronic obstructive pulmonary disease, unspecified: Secondary | ICD-10-CM | POA: Diagnosis not present

## 2021-11-06 DIAGNOSIS — M1990 Unspecified osteoarthritis, unspecified site: Secondary | ICD-10-CM | POA: Diagnosis not present

## 2021-11-06 DIAGNOSIS — Z8673 Personal history of transient ischemic attack (TIA), and cerebral infarction without residual deficits: Secondary | ICD-10-CM | POA: Diagnosis not present

## 2021-11-06 DIAGNOSIS — Z79891 Long term (current) use of opiate analgesic: Secondary | ICD-10-CM | POA: Diagnosis not present

## 2021-11-06 DIAGNOSIS — R6889 Other general symptoms and signs: Secondary | ICD-10-CM | POA: Diagnosis not present

## 2021-11-06 DIAGNOSIS — Z79899 Other long term (current) drug therapy: Secondary | ICD-10-CM | POA: Diagnosis not present

## 2021-11-08 DIAGNOSIS — I4891 Unspecified atrial fibrillation: Secondary | ICD-10-CM | POA: Diagnosis not present

## 2021-11-08 DIAGNOSIS — I509 Heart failure, unspecified: Secondary | ICD-10-CM | POA: Diagnosis not present

## 2021-11-08 DIAGNOSIS — J441 Chronic obstructive pulmonary disease with (acute) exacerbation: Secondary | ICD-10-CM | POA: Diagnosis not present

## 2021-11-08 DIAGNOSIS — I110 Hypertensive heart disease with heart failure: Secondary | ICD-10-CM | POA: Diagnosis not present

## 2022-01-28 DIAGNOSIS — Z86711 Personal history of pulmonary embolism: Secondary | ICD-10-CM | POA: Diagnosis not present

## 2022-01-28 DIAGNOSIS — J90 Pleural effusion, not elsewhere classified: Secondary | ICD-10-CM | POA: Diagnosis not present

## 2022-01-28 DIAGNOSIS — I5033 Acute on chronic diastolic (congestive) heart failure: Secondary | ICD-10-CM | POA: Diagnosis not present

## 2022-01-28 DIAGNOSIS — I509 Heart failure, unspecified: Secondary | ICD-10-CM | POA: Diagnosis not present

## 2022-01-28 DIAGNOSIS — J9621 Acute and chronic respiratory failure with hypoxia: Secondary | ICD-10-CM | POA: Diagnosis not present

## 2022-01-28 DIAGNOSIS — Z85118 Personal history of other malignant neoplasm of bronchus and lung: Secondary | ICD-10-CM | POA: Diagnosis not present

## 2022-01-28 DIAGNOSIS — R000 Tachycardia, unspecified: Secondary | ICD-10-CM | POA: Diagnosis not present

## 2022-01-28 DIAGNOSIS — D649 Anemia, unspecified: Secondary | ICD-10-CM | POA: Diagnosis not present

## 2022-01-28 DIAGNOSIS — E119 Type 2 diabetes mellitus without complications: Secondary | ICD-10-CM | POA: Diagnosis not present

## 2022-01-28 DIAGNOSIS — J441 Chronic obstructive pulmonary disease with (acute) exacerbation: Secondary | ICD-10-CM | POA: Diagnosis not present

## 2022-01-28 DIAGNOSIS — Z9981 Dependence on supplemental oxygen: Secondary | ICD-10-CM | POA: Diagnosis not present

## 2022-01-28 DIAGNOSIS — Z888 Allergy status to other drugs, medicaments and biological substances status: Secondary | ICD-10-CM | POA: Diagnosis not present

## 2022-01-28 DIAGNOSIS — G894 Chronic pain syndrome: Secondary | ICD-10-CM | POA: Diagnosis not present

## 2022-01-28 DIAGNOSIS — R0602 Shortness of breath: Secondary | ICD-10-CM | POA: Diagnosis not present

## 2022-01-28 DIAGNOSIS — Z886 Allergy status to analgesic agent status: Secondary | ICD-10-CM | POA: Diagnosis not present

## 2022-01-28 DIAGNOSIS — Z87440 Personal history of urinary (tract) infections: Secondary | ICD-10-CM | POA: Diagnosis not present

## 2022-01-28 DIAGNOSIS — J449 Chronic obstructive pulmonary disease, unspecified: Secondary | ICD-10-CM | POA: Diagnosis not present

## 2022-01-28 DIAGNOSIS — I4891 Unspecified atrial fibrillation: Secondary | ICD-10-CM | POA: Diagnosis not present

## 2022-01-28 DIAGNOSIS — Z881 Allergy status to other antibiotic agents status: Secondary | ICD-10-CM | POA: Diagnosis not present

## 2022-01-28 DIAGNOSIS — M1990 Unspecified osteoarthritis, unspecified site: Secondary | ICD-10-CM | POA: Diagnosis not present

## 2022-01-28 DIAGNOSIS — I110 Hypertensive heart disease with heart failure: Secondary | ICD-10-CM | POA: Diagnosis not present

## 2022-01-28 DIAGNOSIS — R5381 Other malaise: Secondary | ICD-10-CM | POA: Diagnosis not present

## 2022-01-28 DIAGNOSIS — Z8673 Personal history of transient ischemic attack (TIA), and cerebral infarction without residual deficits: Secondary | ICD-10-CM | POA: Diagnosis not present

## 2022-01-28 DIAGNOSIS — F17210 Nicotine dependence, cigarettes, uncomplicated: Secondary | ICD-10-CM | POA: Diagnosis not present

## 2022-01-28 DIAGNOSIS — E785 Hyperlipidemia, unspecified: Secondary | ICD-10-CM | POA: Diagnosis not present

## 2022-01-28 DIAGNOSIS — F32A Depression, unspecified: Secondary | ICD-10-CM | POA: Diagnosis not present

## 2022-01-28 DIAGNOSIS — Z885 Allergy status to narcotic agent status: Secondary | ICD-10-CM | POA: Diagnosis not present

## 2022-01-28 DIAGNOSIS — R079 Chest pain, unspecified: Secondary | ICD-10-CM | POA: Diagnosis not present

## 2022-01-28 DIAGNOSIS — I639 Cerebral infarction, unspecified: Secondary | ICD-10-CM | POA: Diagnosis not present

## 2022-01-28 DIAGNOSIS — C3490 Malignant neoplasm of unspecified part of unspecified bronchus or lung: Secondary | ICD-10-CM | POA: Diagnosis not present

## 2022-02-15 DIAGNOSIS — I482 Chronic atrial fibrillation, unspecified: Secondary | ICD-10-CM | POA: Diagnosis not present

## 2022-02-15 DIAGNOSIS — R0902 Hypoxemia: Secondary | ICD-10-CM | POA: Diagnosis not present

## 2022-02-15 DIAGNOSIS — J9621 Acute and chronic respiratory failure with hypoxia: Secondary | ICD-10-CM | POA: Diagnosis not present

## 2022-02-15 DIAGNOSIS — J910 Malignant pleural effusion: Secondary | ICD-10-CM | POA: Diagnosis not present

## 2022-02-15 DIAGNOSIS — G8929 Other chronic pain: Secondary | ICD-10-CM | POA: Diagnosis not present

## 2022-02-15 DIAGNOSIS — Z743 Need for continuous supervision: Secondary | ICD-10-CM | POA: Diagnosis not present

## 2022-02-15 DIAGNOSIS — J918 Pleural effusion in other conditions classified elsewhere: Secondary | ICD-10-CM | POA: Diagnosis not present

## 2022-02-15 DIAGNOSIS — G9341 Metabolic encephalopathy: Secondary | ICD-10-CM | POA: Diagnosis not present

## 2022-02-15 DIAGNOSIS — F17210 Nicotine dependence, cigarettes, uncomplicated: Secondary | ICD-10-CM | POA: Diagnosis not present

## 2022-02-15 DIAGNOSIS — R197 Diarrhea, unspecified: Secondary | ICD-10-CM | POA: Diagnosis not present

## 2022-02-15 DIAGNOSIS — C3490 Malignant neoplasm of unspecified part of unspecified bronchus or lung: Secondary | ICD-10-CM | POA: Diagnosis not present

## 2022-02-15 DIAGNOSIS — R531 Weakness: Secondary | ICD-10-CM | POA: Diagnosis not present

## 2022-02-15 DIAGNOSIS — J90 Pleural effusion, not elsewhere classified: Secondary | ICD-10-CM | POA: Diagnosis not present

## 2022-02-15 DIAGNOSIS — D72829 Elevated white blood cell count, unspecified: Secondary | ICD-10-CM | POA: Diagnosis not present

## 2022-02-15 DIAGNOSIS — E119 Type 2 diabetes mellitus without complications: Secondary | ICD-10-CM | POA: Diagnosis not present

## 2022-02-15 DIAGNOSIS — Z87440 Personal history of urinary (tract) infections: Secondary | ICD-10-CM | POA: Diagnosis not present

## 2022-02-15 DIAGNOSIS — Z86711 Personal history of pulmonary embolism: Secondary | ICD-10-CM | POA: Diagnosis not present

## 2022-02-15 DIAGNOSIS — M1990 Unspecified osteoarthritis, unspecified site: Secondary | ICD-10-CM | POA: Diagnosis not present

## 2022-02-15 DIAGNOSIS — I110 Hypertensive heart disease with heart failure: Secondary | ICD-10-CM | POA: Diagnosis not present

## 2022-02-15 DIAGNOSIS — R188 Other ascites: Secondary | ICD-10-CM | POA: Diagnosis not present

## 2022-02-15 DIAGNOSIS — I5032 Chronic diastolic (congestive) heart failure: Secondary | ICD-10-CM | POA: Diagnosis not present

## 2022-02-15 DIAGNOSIS — Z85118 Personal history of other malignant neoplasm of bronchus and lung: Secondary | ICD-10-CM | POA: Diagnosis not present

## 2022-02-15 DIAGNOSIS — R069 Unspecified abnormalities of breathing: Secondary | ICD-10-CM | POA: Diagnosis not present

## 2022-02-15 DIAGNOSIS — M545 Low back pain, unspecified: Secondary | ICD-10-CM | POA: Diagnosis not present

## 2022-02-15 DIAGNOSIS — D649 Anemia, unspecified: Secondary | ICD-10-CM | POA: Diagnosis not present

## 2022-02-15 DIAGNOSIS — J9811 Atelectasis: Secondary | ICD-10-CM | POA: Diagnosis not present

## 2022-02-15 DIAGNOSIS — Z9981 Dependence on supplemental oxygen: Secondary | ICD-10-CM | POA: Diagnosis not present

## 2022-02-15 DIAGNOSIS — E222 Syndrome of inappropriate secretion of antidiuretic hormone: Secondary | ICD-10-CM | POA: Diagnosis not present

## 2022-02-15 DIAGNOSIS — I499 Cardiac arrhythmia, unspecified: Secondary | ICD-10-CM | POA: Diagnosis not present

## 2022-02-15 DIAGNOSIS — F32A Depression, unspecified: Secondary | ICD-10-CM | POA: Diagnosis not present

## 2022-02-15 DIAGNOSIS — J440 Chronic obstructive pulmonary disease with acute lower respiratory infection: Secondary | ICD-10-CM | POA: Diagnosis not present

## 2022-02-15 DIAGNOSIS — N3289 Other specified disorders of bladder: Secondary | ICD-10-CM | POA: Diagnosis not present

## 2022-02-15 DIAGNOSIS — J441 Chronic obstructive pulmonary disease with (acute) exacerbation: Secondary | ICD-10-CM | POA: Diagnosis not present

## 2022-02-19 ENCOUNTER — Telehealth: Payer: Self-pay

## 2022-02-19 NOTE — Patient Outreach (Signed)
  Care Coordination   02/19/2022 Name: Courtney Grant MRN: [id number] DOB: 01/02/1933   Care Coordination Outreach Attempts:  A second unsuccessful outreach was attempted today to offer the patient with information about available care coordination services as a benefit of their health plan.     Follow Up Plan:  Additional outreach attempts will be made to offer the patient care coordination information and services.   Encounter Outcome:  No Answer   Care Coordination Interventions:  No, not indicated    Tomasa Rand, RN, BSN, [facility] [employer] [phone]

## 2022-02-20 ENCOUNTER — Telehealth: Payer: Self-pay

## 2022-02-20 NOTE — Patient Outreach (Signed)
  Care Coordination   02/20/2022 Name: Courtney Grant MRN: [id number] DOB: 1932/08/30   Care Coordination Outreach Attempts:  A third unsuccessful outreach was attempted today to offer the patient with information about available care coordination services as a benefit of their health plan.   Follow Up Plan:  No further outreach attempts will be made at this time. We have been unable to contact the patient to offer or enroll patient in care coordination services  Encounter Outcome:  No Answer   Care Coordination Interventions:  No, not indicated    Tomasa Rand, RN, BSN, CEN [place] Coordinator [phone]

## 2022-03-16 DIAGNOSIS — I110 Hypertensive heart disease with heart failure: Secondary | ICD-10-CM

## 2022-03-16 DIAGNOSIS — D509 Iron deficiency anemia, unspecified: Secondary | ICD-10-CM | POA: Insufficient documentation

## 2022-03-16 DIAGNOSIS — E039 Hypothyroidism, unspecified: Secondary | ICD-10-CM | POA: Insufficient documentation

## 2022-03-16 HISTORY — DX: Hypothyroidism, unspecified: E03.9

## 2022-03-16 HISTORY — DX: Iron deficiency anemia, unspecified: D50.9

## 2022-03-16 HISTORY — DX: Hypertensive heart disease with heart failure: I11.0

## 2022-06-12 DIAGNOSIS — J9611 Chronic respiratory failure with hypoxia: Secondary | ICD-10-CM | POA: Insufficient documentation

## 2022-06-12 HISTORY — DX: Chronic respiratory failure with hypoxia: J96.11

## 2022-06-15 DIAGNOSIS — Z5181 Encounter for therapeutic drug level monitoring: Secondary | ICD-10-CM | POA: Diagnosis not present

## 2022-06-15 DIAGNOSIS — J9611 Chronic respiratory failure with hypoxia: Secondary | ICD-10-CM | POA: Diagnosis not present

## 2022-06-15 DIAGNOSIS — R1312 Dysphagia, oropharyngeal phase: Secondary | ICD-10-CM | POA: Diagnosis not present

## 2022-06-15 DIAGNOSIS — G4700 Insomnia, unspecified: Secondary | ICD-10-CM | POA: Diagnosis not present

## 2022-06-15 DIAGNOSIS — Z7409 Other reduced mobility: Secondary | ICD-10-CM | POA: Diagnosis not present

## 2022-06-15 DIAGNOSIS — G894 Chronic pain syndrome: Secondary | ICD-10-CM | POA: Diagnosis not present

## 2022-06-15 DIAGNOSIS — J90 Pleural effusion, not elsewhere classified: Secondary | ICD-10-CM | POA: Diagnosis not present

## 2022-06-15 DIAGNOSIS — M19041 Primary osteoarthritis, right hand: Secondary | ICD-10-CM | POA: Diagnosis not present

## 2022-06-15 DIAGNOSIS — E119 Type 2 diabetes mellitus without complications: Secondary | ICD-10-CM | POA: Diagnosis not present

## 2022-06-15 DIAGNOSIS — I509 Heart failure, unspecified: Secondary | ICD-10-CM | POA: Diagnosis not present

## 2022-06-15 DIAGNOSIS — G928 Other toxic encephalopathy: Secondary | ICD-10-CM | POA: Diagnosis not present

## 2022-06-15 DIAGNOSIS — R7989 Other specified abnormal findings of blood chemistry: Secondary | ICD-10-CM | POA: Diagnosis not present

## 2022-06-15 DIAGNOSIS — E785 Hyperlipidemia, unspecified: Secondary | ICD-10-CM | POA: Diagnosis not present

## 2022-06-15 DIAGNOSIS — I482 Chronic atrial fibrillation, unspecified: Secondary | ICD-10-CM | POA: Diagnosis not present

## 2022-06-15 DIAGNOSIS — J9621 Acute and chronic respiratory failure with hypoxia: Secondary | ICD-10-CM | POA: Diagnosis not present

## 2022-06-15 DIAGNOSIS — J449 Chronic obstructive pulmonary disease, unspecified: Secondary | ICD-10-CM | POA: Diagnosis not present

## 2022-06-15 DIAGNOSIS — M6281 Muscle weakness (generalized): Secondary | ICD-10-CM | POA: Diagnosis not present

## 2022-06-15 DIAGNOSIS — Z95828 Presence of other vascular implants and grafts: Secondary | ICD-10-CM | POA: Diagnosis not present

## 2022-06-15 DIAGNOSIS — Z7189 Other specified counseling: Secondary | ICD-10-CM | POA: Diagnosis not present

## 2022-06-15 DIAGNOSIS — I10 Essential (primary) hypertension: Secondary | ICD-10-CM | POA: Diagnosis not present

## 2022-06-15 DIAGNOSIS — R410 Disorientation, unspecified: Secondary | ICD-10-CM | POA: Diagnosis not present

## 2022-06-15 DIAGNOSIS — R10817 Generalized abdominal tenderness: Secondary | ICD-10-CM | POA: Diagnosis not present

## 2022-06-15 DIAGNOSIS — E876 Hypokalemia: Secondary | ICD-10-CM | POA: Diagnosis not present

## 2022-06-15 DIAGNOSIS — J984 Other disorders of lung: Secondary | ICD-10-CM | POA: Diagnosis not present

## 2022-06-15 DIAGNOSIS — I5032 Chronic diastolic (congestive) heart failure: Secondary | ICD-10-CM | POA: Diagnosis not present

## 2022-06-15 DIAGNOSIS — R918 Other nonspecific abnormal finding of lung field: Secondary | ICD-10-CM | POA: Diagnosis not present

## 2022-06-15 DIAGNOSIS — Z741 Need for assistance with personal care: Secondary | ICD-10-CM | POA: Diagnosis not present

## 2022-06-15 DIAGNOSIS — I5020 Unspecified systolic (congestive) heart failure: Secondary | ICD-10-CM | POA: Diagnosis not present

## 2022-06-15 DIAGNOSIS — R2681 Unsteadiness on feet: Secondary | ICD-10-CM | POA: Diagnosis not present

## 2022-06-15 DIAGNOSIS — M1000 Idiopathic gout, unspecified site: Secondary | ICD-10-CM | POA: Diagnosis not present

## 2022-06-15 DIAGNOSIS — K5900 Constipation, unspecified: Secondary | ICD-10-CM | POA: Diagnosis not present

## 2022-06-15 DIAGNOSIS — D509 Iron deficiency anemia, unspecified: Secondary | ICD-10-CM | POA: Diagnosis not present

## 2022-06-15 DIAGNOSIS — E039 Hypothyroidism, unspecified: Secondary | ICD-10-CM | POA: Diagnosis not present

## 2022-06-16 DIAGNOSIS — J90 Pleural effusion, not elsewhere classified: Secondary | ICD-10-CM | POA: Diagnosis not present

## 2022-06-16 DIAGNOSIS — J984 Other disorders of lung: Secondary | ICD-10-CM | POA: Diagnosis not present

## 2022-06-17 DIAGNOSIS — E119 Type 2 diabetes mellitus without complications: Secondary | ICD-10-CM | POA: Diagnosis not present

## 2022-06-18 DIAGNOSIS — Z5181 Encounter for therapeutic drug level monitoring: Secondary | ICD-10-CM | POA: Diagnosis not present

## 2022-06-18 DIAGNOSIS — J90 Pleural effusion, not elsewhere classified: Secondary | ICD-10-CM | POA: Diagnosis not present

## 2022-06-18 DIAGNOSIS — Z7189 Other specified counseling: Secondary | ICD-10-CM | POA: Diagnosis not present

## 2022-06-22 DIAGNOSIS — J9611 Chronic respiratory failure with hypoxia: Secondary | ICD-10-CM | POA: Diagnosis not present

## 2022-06-23 DIAGNOSIS — I10 Essential (primary) hypertension: Secondary | ICD-10-CM | POA: Diagnosis not present

## 2022-06-23 DIAGNOSIS — I509 Heart failure, unspecified: Secondary | ICD-10-CM | POA: Diagnosis not present

## 2022-06-23 DIAGNOSIS — J90 Pleural effusion, not elsewhere classified: Secondary | ICD-10-CM | POA: Diagnosis not present

## 2022-06-23 DIAGNOSIS — E119 Type 2 diabetes mellitus without complications: Secondary | ICD-10-CM | POA: Diagnosis not present

## 2022-06-25 DIAGNOSIS — R10817 Generalized abdominal tenderness: Secondary | ICD-10-CM | POA: Diagnosis not present

## 2022-06-29 DIAGNOSIS — I10 Essential (primary) hypertension: Secondary | ICD-10-CM | POA: Diagnosis not present

## 2022-06-30 DIAGNOSIS — M19041 Primary osteoarthritis, right hand: Secondary | ICD-10-CM | POA: Diagnosis not present

## 2022-06-30 DIAGNOSIS — R410 Disorientation, unspecified: Secondary | ICD-10-CM | POA: Diagnosis not present

## 2022-07-01 DIAGNOSIS — R410 Disorientation, unspecified: Secondary | ICD-10-CM | POA: Diagnosis not present

## 2022-07-01 DIAGNOSIS — J90 Pleural effusion, not elsewhere classified: Secondary | ICD-10-CM | POA: Diagnosis not present

## 2022-07-02 DIAGNOSIS — M1000 Idiopathic gout, unspecified site: Secondary | ICD-10-CM | POA: Diagnosis not present

## 2022-07-08 DIAGNOSIS — J984 Other disorders of lung: Secondary | ICD-10-CM | POA: Diagnosis not present

## 2022-07-08 DIAGNOSIS — J90 Pleural effusion, not elsewhere classified: Secondary | ICD-10-CM | POA: Diagnosis not present

## 2022-07-09 DIAGNOSIS — I10 Essential (primary) hypertension: Secondary | ICD-10-CM | POA: Diagnosis not present

## 2022-07-09 DIAGNOSIS — I5032 Chronic diastolic (congestive) heart failure: Secondary | ICD-10-CM | POA: Diagnosis not present

## 2022-07-13 DIAGNOSIS — R918 Other nonspecific abnormal finding of lung field: Secondary | ICD-10-CM | POA: Diagnosis not present

## 2022-07-14 DIAGNOSIS — I5020 Unspecified systolic (congestive) heart failure: Secondary | ICD-10-CM | POA: Diagnosis not present

## 2022-07-16 DIAGNOSIS — I10 Essential (primary) hypertension: Secondary | ICD-10-CM | POA: Diagnosis not present

## 2022-07-16 DIAGNOSIS — R7989 Other specified abnormal findings of blood chemistry: Secondary | ICD-10-CM | POA: Diagnosis not present

## 2022-07-16 DIAGNOSIS — G928 Other toxic encephalopathy: Secondary | ICD-10-CM | POA: Diagnosis not present

## 2022-07-17 DIAGNOSIS — Z9981 Dependence on supplemental oxygen: Secondary | ICD-10-CM | POA: Diagnosis not present

## 2022-07-17 DIAGNOSIS — Z48813 Encounter for surgical aftercare following surgery on the respiratory system: Secondary | ICD-10-CM | POA: Diagnosis not present

## 2022-07-17 DIAGNOSIS — J90 Pleural effusion, not elsewhere classified: Secondary | ICD-10-CM | POA: Diagnosis not present

## 2022-07-17 DIAGNOSIS — Z7901 Long term (current) use of anticoagulants: Secondary | ICD-10-CM | POA: Diagnosis not present

## 2022-07-17 DIAGNOSIS — E039 Hypothyroidism, unspecified: Secondary | ICD-10-CM | POA: Diagnosis not present

## 2022-07-17 DIAGNOSIS — I110 Hypertensive heart disease with heart failure: Secondary | ICD-10-CM | POA: Diagnosis not present

## 2022-07-17 DIAGNOSIS — J4489 Other specified chronic obstructive pulmonary disease: Secondary | ICD-10-CM | POA: Diagnosis not present

## 2022-07-17 DIAGNOSIS — J9611 Chronic respiratory failure with hypoxia: Secondary | ICD-10-CM | POA: Diagnosis not present

## 2022-07-17 DIAGNOSIS — I482 Chronic atrial fibrillation, unspecified: Secondary | ICD-10-CM | POA: Diagnosis not present

## 2022-07-17 DIAGNOSIS — I5032 Chronic diastolic (congestive) heart failure: Secondary | ICD-10-CM | POA: Diagnosis not present

## 2022-07-17 DIAGNOSIS — D509 Iron deficiency anemia, unspecified: Secondary | ICD-10-CM | POA: Diagnosis not present

## 2022-07-20 ENCOUNTER — Encounter: Payer: Self-pay | Admitting: Internal Medicine

## 2022-07-20 ENCOUNTER — Ambulatory Visit: Payer: Medicare Other | Admitting: Internal Medicine

## 2022-07-20 VITALS — BP 102/60 | Temp 97.4°F | Resp 18 | Ht 63.0 in | Wt 125.0 lb

## 2022-07-20 DIAGNOSIS — K22719 Barrett's esophagus with dysplasia, unspecified: Secondary | ICD-10-CM | POA: Diagnosis not present

## 2022-07-20 DIAGNOSIS — I4891 Unspecified atrial fibrillation: Secondary | ICD-10-CM | POA: Diagnosis not present

## 2022-07-20 DIAGNOSIS — E782 Mixed hyperlipidemia: Secondary | ICD-10-CM | POA: Diagnosis not present

## 2022-07-20 DIAGNOSIS — G8929 Other chronic pain: Secondary | ICD-10-CM | POA: Diagnosis not present

## 2022-07-20 DIAGNOSIS — M545 Low back pain, unspecified: Secondary | ICD-10-CM | POA: Diagnosis not present

## 2022-07-20 DIAGNOSIS — G894 Chronic pain syndrome: Secondary | ICD-10-CM

## 2022-07-20 DIAGNOSIS — J431 Panlobular emphysema: Secondary | ICD-10-CM | POA: Diagnosis not present

## 2022-07-20 NOTE — Progress Notes (Addendum)
Office Visit  Subjective   Patient ID: Courtney Grant   DOB: October 06, 1932   Age: 89 y.o.   MRN: [id number]   Chief Complaint Chief Complaint  Patient presents with   Follow-up    NURSING HOME      History of Present Illness  89 years old female who was admitted to [facility] 5/23 and she was discharge to [facility] from there she went home with hospice but hospice has dropped her. She is at home and her grand son live with her. She is on 3 L oxygen all the time.  She is seeing hospice doctor for pain management.  She walk with walker at home, she says that she can bath herself and shower herself.  She has chronic pain and was getting dilauded and she was given pain medications last week. She is not under hospice care any more. She use to see Dr. Welton Flakes for pain management, I have discussed with grandson and they will call Dr. Welton Flakes office who does telemedicine for pain management.  I have reviewed her home medication list that was given to her from nursing home at the time of discharge.  She has chronic atrial fibrillation and take diltiazem for rate control and Eliquis 2.5 mg twice a day for anticoagulation.  She also take metoprolol 50 mg daily. She has hypothyroidism and she take levothyroxine 25 millimicrograms daily.  I have discussed with her that I will review her medication and then we will reevaluate after that. She also has COPD and pleural effusion for that Pleurx catheter was placed that need to be removed as there is no drainage.  Past Medical History Past Medical History:  Diagnosis Date   Abnormal transaminases    Arthritis 01/29/2016   Overview:  Generalized   Atherosclerosis of native artery of both lower extremities with intermittent claudication (HCC) 10/13/2016   Formatting of this note might be different from the original. Added automatically from request for surgery [phone]   Atrial fibrillation (HCC)    Bacteremia due to methicillin susceptible Staphylococcus  aureus (MSSA) 10/30/2017   Barrett's esophagus    Benign paroxysmal positional vertigo 10/25/2015   Biliary dyskinesia 01/29/2016   Cancer of lung (HCC)    ADENOCARINOMA RUL  Overview:  Overview:  ADENOCARINOMA RUL   Chronic back pain 10/30/2017   Chronic CHF (congestive heart failure) (HCC)    Complication of anesthesia    COPD (chronic obstructive pulmonary disease) (HCC)    Depression 01/29/2016   Fibromyalgia 01/29/2016   Hiatal hernia with GERD 01/29/2016   History of bleeding peptic ulcer 10/30/2017   History of pulmonary embolism 01/29/2016   Overview:  x2   Hyperlipidemia    Hypertension 01/29/2016   Hypokalemia    Normocytic anemia 10/30/2017   O2 dependent    Occlusion of right femoral-popliteal bypass graft (HCC) 10/30/2017   PONV (postoperative nausea and vomiting)    Pulmonary embolism (HCC) 09/13/2020   Formatting of this note might be different from the original. x2   Septic arthritis of knee, right (HCC) 10/30/2017   Stroke (HCC) 01/29/2016   Overview:  Date of stroke is unknown.  "cerebellum"   Tobacco abuse    Urgency incontinence 01/29/2016     Allergies Allergies  Allergen Reactions   Ambien [Zolpidem] Other (See Comments)    GI problems    Aspirin Nausea And Vomiting   Codeine Nausea And Vomiting   Erythromycin Other (See Comments)    Dizziness  Ibuprofen Nausea And Vomiting   Penicillins Rash    Has patient had a PCN reaction causing immediate rash, facial/tongue/throat swelling, SOB or lightheadedness with hypotension: YES Has patient had a PCN reaction causing severe rash involving mucus membranes or skin necrosis: NO Has patient had a PCN reaction that required hospitalization: NO Has patient had a PCN reaction occurring within the last 10 years: YES If all of the above answers are "NO", then may proceed with Cephalosporin use.     Review of Systems Review of Systems  Constitutional: Negative.   HENT: Negative.    Respiratory: Negative.     Cardiovascular: Negative.   Gastrointestinal: Negative.   Neurological:  Positive for weakness.       Objective:    Vitals BP 102/60 (BP Location: Left Arm, Patient Position: Sitting, Cuff Size: Normal)   Temp (!) 97.4 F (36.3 C)   Resp 18   Ht 5\' 3"  (1.6 m)   Wt 125 lb (56.7 kg)   BMI 22.14 kg/m    Physical Examination Physical Exam Constitutional:      Appearance: Normal appearance.  HENT:     Head: Normocephalic and atraumatic.  Cardiovascular:     Rate and Rhythm: Normal rate and regular rhythm.     Heart sounds: Normal heart sounds.  Pulmonary:     Effort: Pulmonary effort is normal.     Breath sounds: Normal breath sounds.  Abdominal:     General: Bowel sounds are normal.     Palpations: Abdomen is soft.  Neurological:     General: No focal deficit present.     Mental Status: She is alert and oriented to person, place, and time.       Assessment & Plan:   Chronic back pain She is on Dilaudid 4 mg and I have discussed with the son that she will follow-up with Dr. Welton Flakes who used to see her before.  Son will call his office to make an appointment.  Hyperlipidemia I will continue with current medication and we will repeat labs on next visit.  Barrett's esophagus She will continue with Protonix 40 mg daily.  COPD (chronic obstructive pulmonary disease) (HCC) Continue with current treatment.  Her Pleurx catheter need to come out.  I will do a chest x-ray and then reevaluate.  Atrial fibrillation (HCC) Her rate is controlled on diltiazem and metoprolol and she is on Eliquis 2.5 mg twice a day.    Return in about 1 month (around 08/20/2022).   Eloisa Northern, MD

## 2022-07-21 DIAGNOSIS — E039 Hypothyroidism, unspecified: Secondary | ICD-10-CM | POA: Diagnosis not present

## 2022-07-21 DIAGNOSIS — Z48813 Encounter for surgical aftercare following surgery on the respiratory system: Secondary | ICD-10-CM | POA: Diagnosis not present

## 2022-07-21 DIAGNOSIS — J4489 Other specified chronic obstructive pulmonary disease: Secondary | ICD-10-CM | POA: Diagnosis not present

## 2022-07-21 DIAGNOSIS — Z9981 Dependence on supplemental oxygen: Secondary | ICD-10-CM | POA: Diagnosis not present

## 2022-07-21 DIAGNOSIS — J90 Pleural effusion, not elsewhere classified: Secondary | ICD-10-CM | POA: Diagnosis not present

## 2022-07-21 DIAGNOSIS — D509 Iron deficiency anemia, unspecified: Secondary | ICD-10-CM | POA: Diagnosis not present

## 2022-07-21 DIAGNOSIS — I5032 Chronic diastolic (congestive) heart failure: Secondary | ICD-10-CM | POA: Diagnosis not present

## 2022-07-21 DIAGNOSIS — Z7901 Long term (current) use of anticoagulants: Secondary | ICD-10-CM | POA: Diagnosis not present

## 2022-07-21 DIAGNOSIS — I482 Chronic atrial fibrillation, unspecified: Secondary | ICD-10-CM | POA: Diagnosis not present

## 2022-07-21 DIAGNOSIS — I110 Hypertensive heart disease with heart failure: Secondary | ICD-10-CM | POA: Diagnosis not present

## 2022-07-21 DIAGNOSIS — J9611 Chronic respiratory failure with hypoxia: Secondary | ICD-10-CM | POA: Diagnosis not present

## 2022-07-23 DIAGNOSIS — I482 Chronic atrial fibrillation, unspecified: Secondary | ICD-10-CM | POA: Diagnosis not present

## 2022-07-23 DIAGNOSIS — Z9981 Dependence on supplemental oxygen: Secondary | ICD-10-CM | POA: Diagnosis not present

## 2022-07-23 DIAGNOSIS — J90 Pleural effusion, not elsewhere classified: Secondary | ICD-10-CM | POA: Diagnosis not present

## 2022-07-23 DIAGNOSIS — J4489 Other specified chronic obstructive pulmonary disease: Secondary | ICD-10-CM | POA: Diagnosis not present

## 2022-07-23 DIAGNOSIS — I110 Hypertensive heart disease with heart failure: Secondary | ICD-10-CM | POA: Diagnosis not present

## 2022-07-23 DIAGNOSIS — D509 Iron deficiency anemia, unspecified: Secondary | ICD-10-CM | POA: Diagnosis not present

## 2022-07-23 DIAGNOSIS — J9611 Chronic respiratory failure with hypoxia: Secondary | ICD-10-CM | POA: Diagnosis not present

## 2022-07-23 DIAGNOSIS — Z7901 Long term (current) use of anticoagulants: Secondary | ICD-10-CM | POA: Diagnosis not present

## 2022-07-23 DIAGNOSIS — E039 Hypothyroidism, unspecified: Secondary | ICD-10-CM | POA: Diagnosis not present

## 2022-07-23 DIAGNOSIS — Z48813 Encounter for surgical aftercare following surgery on the respiratory system: Secondary | ICD-10-CM | POA: Diagnosis not present

## 2022-07-23 DIAGNOSIS — I5032 Chronic diastolic (congestive) heart failure: Secondary | ICD-10-CM | POA: Diagnosis not present

## 2022-07-27 NOTE — Assessment & Plan Note (Signed)
Her rate is controlled on diltiazem and metoprolol and she is on Eliquis 2.5 mg twice a day.

## 2022-07-27 NOTE — Assessment & Plan Note (Signed)
Continue with current treatment.  Her Pleurx catheter need to come out.  I will do a chest x-ray and then reevaluate.

## 2022-07-27 NOTE — Assessment & Plan Note (Signed)
She is on Dilaudid 4 mg and I have discussed with the son that she will follow-up with Dr. Welton Flakes who used to see her before.  Son will call his office to make an appointment.

## 2022-07-27 NOTE — Assessment & Plan Note (Signed)
I will continue with current medication and we will repeat labs on next visit.

## 2022-07-27 NOTE — Assessment & Plan Note (Signed)
She will continue with Protonix 40 mg daily.

## 2022-07-28 DIAGNOSIS — J90 Pleural effusion, not elsewhere classified: Secondary | ICD-10-CM | POA: Diagnosis not present

## 2022-07-28 DIAGNOSIS — J9611 Chronic respiratory failure with hypoxia: Secondary | ICD-10-CM | POA: Diagnosis not present

## 2022-07-28 DIAGNOSIS — D509 Iron deficiency anemia, unspecified: Secondary | ICD-10-CM | POA: Diagnosis not present

## 2022-07-28 DIAGNOSIS — Z9981 Dependence on supplemental oxygen: Secondary | ICD-10-CM | POA: Diagnosis not present

## 2022-07-28 DIAGNOSIS — J9621 Acute and chronic respiratory failure with hypoxia: Secondary | ICD-10-CM | POA: Diagnosis not present

## 2022-07-28 DIAGNOSIS — J4489 Other specified chronic obstructive pulmonary disease: Secondary | ICD-10-CM | POA: Diagnosis not present

## 2022-07-28 DIAGNOSIS — I5032 Chronic diastolic (congestive) heart failure: Secondary | ICD-10-CM | POA: Diagnosis not present

## 2022-07-28 DIAGNOSIS — Z7901 Long term (current) use of anticoagulants: Secondary | ICD-10-CM | POA: Diagnosis not present

## 2022-07-28 DIAGNOSIS — I110 Hypertensive heart disease with heart failure: Secondary | ICD-10-CM | POA: Diagnosis not present

## 2022-07-28 DIAGNOSIS — E039 Hypothyroidism, unspecified: Secondary | ICD-10-CM | POA: Diagnosis not present

## 2022-07-28 DIAGNOSIS — Z48813 Encounter for surgical aftercare following surgery on the respiratory system: Secondary | ICD-10-CM | POA: Diagnosis not present

## 2022-07-28 DIAGNOSIS — I482 Chronic atrial fibrillation, unspecified: Secondary | ICD-10-CM | POA: Diagnosis not present

## 2022-07-30 DIAGNOSIS — E039 Hypothyroidism, unspecified: Secondary | ICD-10-CM | POA: Diagnosis not present

## 2022-07-30 DIAGNOSIS — J90 Pleural effusion, not elsewhere classified: Secondary | ICD-10-CM | POA: Diagnosis not present

## 2022-07-30 DIAGNOSIS — Z48813 Encounter for surgical aftercare following surgery on the respiratory system: Secondary | ICD-10-CM | POA: Diagnosis not present

## 2022-07-30 DIAGNOSIS — D509 Iron deficiency anemia, unspecified: Secondary | ICD-10-CM | POA: Diagnosis not present

## 2022-07-30 DIAGNOSIS — I482 Chronic atrial fibrillation, unspecified: Secondary | ICD-10-CM | POA: Diagnosis not present

## 2022-07-30 DIAGNOSIS — I5032 Chronic diastolic (congestive) heart failure: Secondary | ICD-10-CM | POA: Diagnosis not present

## 2022-07-30 DIAGNOSIS — J9611 Chronic respiratory failure with hypoxia: Secondary | ICD-10-CM | POA: Diagnosis not present

## 2022-07-30 DIAGNOSIS — Z7901 Long term (current) use of anticoagulants: Secondary | ICD-10-CM | POA: Diagnosis not present

## 2022-07-30 DIAGNOSIS — J4489 Other specified chronic obstructive pulmonary disease: Secondary | ICD-10-CM | POA: Diagnosis not present

## 2022-07-30 DIAGNOSIS — Z9981 Dependence on supplemental oxygen: Secondary | ICD-10-CM | POA: Diagnosis not present

## 2022-07-30 DIAGNOSIS — I110 Hypertensive heart disease with heart failure: Secondary | ICD-10-CM | POA: Diagnosis not present

## 2022-08-06 DIAGNOSIS — Z9981 Dependence on supplemental oxygen: Secondary | ICD-10-CM | POA: Diagnosis not present

## 2022-08-06 DIAGNOSIS — I482 Chronic atrial fibrillation, unspecified: Secondary | ICD-10-CM | POA: Diagnosis not present

## 2022-08-06 DIAGNOSIS — I5032 Chronic diastolic (congestive) heart failure: Secondary | ICD-10-CM | POA: Diagnosis not present

## 2022-08-06 DIAGNOSIS — D509 Iron deficiency anemia, unspecified: Secondary | ICD-10-CM | POA: Diagnosis not present

## 2022-08-06 DIAGNOSIS — I110 Hypertensive heart disease with heart failure: Secondary | ICD-10-CM | POA: Diagnosis not present

## 2022-08-06 DIAGNOSIS — Z48813 Encounter for surgical aftercare following surgery on the respiratory system: Secondary | ICD-10-CM | POA: Diagnosis not present

## 2022-08-06 DIAGNOSIS — J90 Pleural effusion, not elsewhere classified: Secondary | ICD-10-CM | POA: Diagnosis not present

## 2022-08-06 DIAGNOSIS — Z7901 Long term (current) use of anticoagulants: Secondary | ICD-10-CM | POA: Diagnosis not present

## 2022-08-06 DIAGNOSIS — J4489 Other specified chronic obstructive pulmonary disease: Secondary | ICD-10-CM | POA: Diagnosis not present

## 2022-08-06 DIAGNOSIS — J9611 Chronic respiratory failure with hypoxia: Secondary | ICD-10-CM | POA: Diagnosis not present

## 2022-08-06 DIAGNOSIS — E039 Hypothyroidism, unspecified: Secondary | ICD-10-CM | POA: Diagnosis not present

## 2022-08-07 ENCOUNTER — Other Ambulatory Visit: Payer: Self-pay

## 2022-08-07 MED ORDER — MIRTAZAPINE 15 MG PO TABS: 15.0000 mg | ORAL_TABLET | Freq: Every day | ORAL | 2 refills | Status: DC

## 2022-08-07 MED ORDER — LEVOTHYROXINE SODIUM 25 MCG PO TABS: 25.0000 ug | ORAL_TABLET | Freq: Every day | ORAL | 2 refills | Status: DC

## 2022-08-07 MED ORDER — GABAPENTIN 300 MG PO CAPS: 300.0000 mg | ORAL_CAPSULE | Freq: Two times a day (BID) | ORAL | 2 refills | Status: DC

## 2022-08-12 ENCOUNTER — Other Ambulatory Visit: Payer: Self-pay

## 2022-08-12 DIAGNOSIS — J9621 Acute and chronic respiratory failure with hypoxia: Secondary | ICD-10-CM | POA: Diagnosis not present

## 2022-08-12 DIAGNOSIS — J449 Chronic obstructive pulmonary disease, unspecified: Secondary | ICD-10-CM | POA: Diagnosis not present

## 2022-08-12 MED ORDER — APIXABAN 5 MG PO TABS: 5.0000 mg | ORAL_TABLET | Freq: Two times a day (BID) | ORAL | 3 refills | Status: DC

## 2022-08-13 DIAGNOSIS — J90 Pleural effusion, not elsewhere classified: Secondary | ICD-10-CM | POA: Diagnosis not present

## 2022-08-13 DIAGNOSIS — J9611 Chronic respiratory failure with hypoxia: Secondary | ICD-10-CM | POA: Diagnosis not present

## 2022-08-13 DIAGNOSIS — J4489 Other specified chronic obstructive pulmonary disease: Secondary | ICD-10-CM | POA: Diagnosis not present

## 2022-08-13 DIAGNOSIS — Z9981 Dependence on supplemental oxygen: Secondary | ICD-10-CM | POA: Diagnosis not present

## 2022-08-13 DIAGNOSIS — E039 Hypothyroidism, unspecified: Secondary | ICD-10-CM | POA: Diagnosis not present

## 2022-08-13 DIAGNOSIS — Z7901 Long term (current) use of anticoagulants: Secondary | ICD-10-CM | POA: Diagnosis not present

## 2022-08-13 DIAGNOSIS — I5032 Chronic diastolic (congestive) heart failure: Secondary | ICD-10-CM | POA: Diagnosis not present

## 2022-08-13 DIAGNOSIS — I482 Chronic atrial fibrillation, unspecified: Secondary | ICD-10-CM | POA: Diagnosis not present

## 2022-08-13 DIAGNOSIS — Z48813 Encounter for surgical aftercare following surgery on the respiratory system: Secondary | ICD-10-CM | POA: Diagnosis not present

## 2022-08-13 DIAGNOSIS — D509 Iron deficiency anemia, unspecified: Secondary | ICD-10-CM | POA: Diagnosis not present

## 2022-08-13 DIAGNOSIS — I110 Hypertensive heart disease with heart failure: Secondary | ICD-10-CM | POA: Diagnosis not present

## 2022-08-14 ENCOUNTER — Other Ambulatory Visit: Payer: Self-pay

## 2022-08-14 MED ORDER — MECLIZINE HCL 25 MG PO TABS: 25.0000 mg | ORAL_TABLET | Freq: Two times a day (BID) | ORAL | 2 refills | Status: DC | PRN

## 2022-08-17 ENCOUNTER — Ambulatory Visit: Payer: Medicare Other | Admitting: Internal Medicine

## 2022-08-18 DIAGNOSIS — J90 Pleural effusion, not elsewhere classified: Secondary | ICD-10-CM | POA: Diagnosis not present

## 2022-08-18 DIAGNOSIS — I509 Heart failure, unspecified: Secondary | ICD-10-CM | POA: Diagnosis not present

## 2022-08-18 DIAGNOSIS — J4489 Other specified chronic obstructive pulmonary disease: Secondary | ICD-10-CM | POA: Diagnosis not present

## 2022-08-18 DIAGNOSIS — I480 Paroxysmal atrial fibrillation: Secondary | ICD-10-CM | POA: Diagnosis not present

## 2022-08-18 DIAGNOSIS — I10 Essential (primary) hypertension: Secondary | ICD-10-CM | POA: Diagnosis not present

## 2022-08-20 ENCOUNTER — Other Ambulatory Visit: Payer: Self-pay

## 2022-08-20 DIAGNOSIS — E039 Hypothyroidism, unspecified: Secondary | ICD-10-CM | POA: Diagnosis not present

## 2022-08-20 DIAGNOSIS — I110 Hypertensive heart disease with heart failure: Secondary | ICD-10-CM | POA: Diagnosis not present

## 2022-08-20 DIAGNOSIS — I5032 Chronic diastolic (congestive) heart failure: Secondary | ICD-10-CM | POA: Diagnosis not present

## 2022-08-20 DIAGNOSIS — J4489 Other specified chronic obstructive pulmonary disease: Secondary | ICD-10-CM | POA: Diagnosis not present

## 2022-08-20 DIAGNOSIS — D509 Iron deficiency anemia, unspecified: Secondary | ICD-10-CM | POA: Diagnosis not present

## 2022-08-20 DIAGNOSIS — I482 Chronic atrial fibrillation, unspecified: Secondary | ICD-10-CM | POA: Diagnosis not present

## 2022-08-20 DIAGNOSIS — J90 Pleural effusion, not elsewhere classified: Secondary | ICD-10-CM | POA: Diagnosis not present

## 2022-08-20 DIAGNOSIS — Z9981 Dependence on supplemental oxygen: Secondary | ICD-10-CM | POA: Diagnosis not present

## 2022-08-20 DIAGNOSIS — Z48813 Encounter for surgical aftercare following surgery on the respiratory system: Secondary | ICD-10-CM | POA: Diagnosis not present

## 2022-08-20 DIAGNOSIS — J9611 Chronic respiratory failure with hypoxia: Secondary | ICD-10-CM | POA: Diagnosis not present

## 2022-08-20 DIAGNOSIS — Z7901 Long term (current) use of anticoagulants: Secondary | ICD-10-CM | POA: Diagnosis not present

## 2022-08-20 MED ORDER — BUSPIRONE HCL 15 MG PO TABS: 15.0000 mg | ORAL_TABLET | Freq: Three times a day (TID) | ORAL | 3 refills | Status: DC

## 2022-08-21 ENCOUNTER — Other Ambulatory Visit: Payer: Self-pay

## 2022-08-25 ENCOUNTER — Other Ambulatory Visit: Payer: Self-pay

## 2022-08-25 MED ORDER — ALLOPURINOL 100 MG PO TABS: 100.0000 mg | ORAL_TABLET | Freq: Every day | ORAL | 0 refills | Status: DC

## 2022-08-27 DIAGNOSIS — Z7901 Long term (current) use of anticoagulants: Secondary | ICD-10-CM | POA: Diagnosis not present

## 2022-08-27 DIAGNOSIS — I110 Hypertensive heart disease with heart failure: Secondary | ICD-10-CM | POA: Diagnosis not present

## 2022-08-27 DIAGNOSIS — D509 Iron deficiency anemia, unspecified: Secondary | ICD-10-CM | POA: Diagnosis not present

## 2022-08-27 DIAGNOSIS — J90 Pleural effusion, not elsewhere classified: Secondary | ICD-10-CM | POA: Diagnosis not present

## 2022-08-27 DIAGNOSIS — I482 Chronic atrial fibrillation, unspecified: Secondary | ICD-10-CM | POA: Diagnosis not present

## 2022-08-27 DIAGNOSIS — J9611 Chronic respiratory failure with hypoxia: Secondary | ICD-10-CM | POA: Diagnosis not present

## 2022-08-27 DIAGNOSIS — Z48813 Encounter for surgical aftercare following surgery on the respiratory system: Secondary | ICD-10-CM | POA: Diagnosis not present

## 2022-08-27 DIAGNOSIS — Z9981 Dependence on supplemental oxygen: Secondary | ICD-10-CM | POA: Diagnosis not present

## 2022-08-27 DIAGNOSIS — J4489 Other specified chronic obstructive pulmonary disease: Secondary | ICD-10-CM | POA: Diagnosis not present

## 2022-08-27 DIAGNOSIS — I5032 Chronic diastolic (congestive) heart failure: Secondary | ICD-10-CM | POA: Diagnosis not present

## 2022-08-27 DIAGNOSIS — E039 Hypothyroidism, unspecified: Secondary | ICD-10-CM | POA: Diagnosis not present

## 2022-08-28 DIAGNOSIS — I5032 Chronic diastolic (congestive) heart failure: Secondary | ICD-10-CM | POA: Diagnosis not present

## 2022-08-28 DIAGNOSIS — J9621 Acute and chronic respiratory failure with hypoxia: Secondary | ICD-10-CM | POA: Diagnosis not present

## 2022-09-02 ENCOUNTER — Other Ambulatory Visit: Payer: Self-pay

## 2022-09-02 DIAGNOSIS — M179 Osteoarthritis of knee, unspecified: Secondary | ICD-10-CM | POA: Diagnosis not present

## 2022-09-02 DIAGNOSIS — M169 Osteoarthritis of hip, unspecified: Secondary | ICD-10-CM | POA: Diagnosis not present

## 2022-09-02 DIAGNOSIS — F17210 Nicotine dependence, cigarettes, uncomplicated: Secondary | ICD-10-CM | POA: Diagnosis not present

## 2022-09-02 DIAGNOSIS — M542 Cervicalgia: Secondary | ICD-10-CM | POA: Diagnosis not present

## 2022-09-02 DIAGNOSIS — G8918 Other acute postprocedural pain: Secondary | ICD-10-CM | POA: Diagnosis not present

## 2022-09-02 DIAGNOSIS — M545 Low back pain, unspecified: Secondary | ICD-10-CM | POA: Diagnosis not present

## 2022-09-02 DIAGNOSIS — Z720 Tobacco use: Secondary | ICD-10-CM | POA: Diagnosis not present

## 2022-09-02 DIAGNOSIS — M5440 Lumbago with sciatica, unspecified side: Secondary | ICD-10-CM | POA: Diagnosis not present

## 2022-09-02 DIAGNOSIS — M79661 Pain in right lower leg: Secondary | ICD-10-CM | POA: Diagnosis not present

## 2022-09-02 DIAGNOSIS — G894 Chronic pain syndrome: Secondary | ICD-10-CM | POA: Diagnosis not present

## 2022-09-02 MED ORDER — BUMETANIDE 2 MG PO TABS: 4.0000 mg | ORAL_TABLET | Freq: Two times a day (BID) | ORAL | 1 refills | Status: DC

## 2022-09-03 ENCOUNTER — Other Ambulatory Visit: Payer: Self-pay | Admitting: Internal Medicine

## 2022-09-03 DIAGNOSIS — I5032 Chronic diastolic (congestive) heart failure: Secondary | ICD-10-CM | POA: Diagnosis not present

## 2022-09-03 DIAGNOSIS — I110 Hypertensive heart disease with heart failure: Secondary | ICD-10-CM | POA: Diagnosis not present

## 2022-09-03 DIAGNOSIS — E039 Hypothyroidism, unspecified: Secondary | ICD-10-CM | POA: Diagnosis not present

## 2022-09-03 DIAGNOSIS — Z9981 Dependence on supplemental oxygen: Secondary | ICD-10-CM | POA: Diagnosis not present

## 2022-09-03 DIAGNOSIS — J90 Pleural effusion, not elsewhere classified: Secondary | ICD-10-CM | POA: Diagnosis not present

## 2022-09-03 DIAGNOSIS — J9611 Chronic respiratory failure with hypoxia: Secondary | ICD-10-CM | POA: Diagnosis not present

## 2022-09-03 DIAGNOSIS — Z48813 Encounter for surgical aftercare following surgery on the respiratory system: Secondary | ICD-10-CM | POA: Diagnosis not present

## 2022-09-03 DIAGNOSIS — I482 Chronic atrial fibrillation, unspecified: Secondary | ICD-10-CM | POA: Diagnosis not present

## 2022-09-03 DIAGNOSIS — Z7901 Long term (current) use of anticoagulants: Secondary | ICD-10-CM | POA: Diagnosis not present

## 2022-09-03 DIAGNOSIS — J4489 Other specified chronic obstructive pulmonary disease: Secondary | ICD-10-CM | POA: Diagnosis not present

## 2022-09-03 DIAGNOSIS — D509 Iron deficiency anemia, unspecified: Secondary | ICD-10-CM | POA: Diagnosis not present

## 2022-09-03 MED ORDER — PRESERVISION AREDS PO CAPS: 1.0000 | ORAL_CAPSULE | Freq: Every day | ORAL | 1 refills | Status: DC

## 2022-09-03 MED ORDER — METOPROLOL TARTRATE 50 MG PO TABS: 75.0000 mg | ORAL_TABLET | Freq: Two times a day (BID) | ORAL | 1 refills | Status: DC

## 2022-09-03 MED ORDER — MIRTAZAPINE 15 MG PO TABS: 15.0000 mg | ORAL_TABLET | Freq: Every day | ORAL | 2 refills | Status: DC

## 2022-09-03 MED ORDER — ALBUTEROL SULFATE HFA 108 (90 BASE) MCG/ACT IN AERS: 2.0000 | INHALATION_SPRAY | Freq: Four times a day (QID) | RESPIRATORY_TRACT | 1 refills | Status: DC | PRN

## 2022-09-03 MED ORDER — ALLOPURINOL 100 MG PO TABS: 100.0000 mg | ORAL_TABLET | Freq: Every day | ORAL | 0 refills | Status: DC

## 2022-09-03 MED ORDER — LEVOTHYROXINE SODIUM 25 MCG PO TABS: 25.0000 ug | ORAL_TABLET | Freq: Every day | ORAL | 2 refills | Status: DC

## 2022-09-03 MED ORDER — MONTELUKAST SODIUM 10 MG PO TABS: 10.0000 mg | ORAL_TABLET | Freq: Every day | ORAL | 1 refills | Status: DC

## 2022-09-03 MED ORDER — POTASSIUM CHLORIDE CRYS ER 20 MEQ PO TBCR: 20.0000 meq | EXTENDED_RELEASE_TABLET | Freq: Every day | ORAL | 1 refills | Status: DC

## 2022-09-03 MED ORDER — DICYCLOMINE HCL 20 MG PO TABS: 20.0000 mg | ORAL_TABLET | Freq: Three times a day (TID) | ORAL | 1 refills | Status: DC | PRN

## 2022-09-03 MED ORDER — BUSPIRONE HCL 15 MG PO TABS: 15.0000 mg | ORAL_TABLET | Freq: Three times a day (TID) | ORAL | 3 refills | Status: DC

## 2022-09-03 MED ORDER — FUROSEMIDE 40 MG PO TABS: 40.0000 mg | ORAL_TABLET | Freq: Every day | ORAL | 1 refills | Status: DC

## 2022-09-03 MED ORDER — PANTOPRAZOLE SODIUM 40 MG PO TBEC: 40.0000 mg | DELAYED_RELEASE_TABLET | Freq: Every day | ORAL | 1 refills | Status: DC

## 2022-09-03 MED ORDER — ONDANSETRON HCL 4 MG PO TABS: 4.0000 mg | ORAL_TABLET | Freq: Four times a day (QID) | ORAL | 1 refills | Status: DC | PRN

## 2022-09-03 MED ORDER — GABAPENTIN 300 MG PO CAPS: 300.0000 mg | ORAL_CAPSULE | Freq: Two times a day (BID) | ORAL | 2 refills | Status: DC

## 2022-09-03 MED ORDER — SUCRALFATE 1 G PO TABS: 1.0000 g | ORAL_TABLET | Freq: Four times a day (QID) | ORAL | 1 refills | Status: DC

## 2022-09-03 MED ORDER — ATORVASTATIN CALCIUM 20 MG PO TABS: 20.0000 mg | ORAL_TABLET | Freq: Every day | ORAL | 1 refills | Status: DC

## 2022-09-03 MED ORDER — MECLIZINE HCL 25 MG PO TABS: 25.0000 mg | ORAL_TABLET | Freq: Two times a day (BID) | ORAL | 2 refills | Status: DC | PRN

## 2022-09-03 MED ORDER — APIXABAN 5 MG PO TABS: 5.0000 mg | ORAL_TABLET | Freq: Two times a day (BID) | ORAL | 3 refills | Status: DC

## 2022-09-04 DIAGNOSIS — W19XXXA Unspecified fall, initial encounter: Secondary | ICD-10-CM | POA: Diagnosis not present

## 2022-09-04 DIAGNOSIS — K219 Gastro-esophageal reflux disease without esophagitis: Secondary | ICD-10-CM | POA: Diagnosis not present

## 2022-09-04 DIAGNOSIS — R0789 Other chest pain: Secondary | ICD-10-CM | POA: Diagnosis not present

## 2022-09-04 DIAGNOSIS — J449 Chronic obstructive pulmonary disease, unspecified: Secondary | ICD-10-CM | POA: Diagnosis not present

## 2022-09-04 DIAGNOSIS — I10 Essential (primary) hypertension: Secondary | ICD-10-CM | POA: Diagnosis not present

## 2022-09-04 DIAGNOSIS — R9431 Abnormal electrocardiogram [ECG] [EKG]: Secondary | ICD-10-CM | POA: Diagnosis not present

## 2022-09-04 DIAGNOSIS — Z95828 Presence of other vascular implants and grafts: Secondary | ICD-10-CM | POA: Diagnosis not present

## 2022-09-04 DIAGNOSIS — G894 Chronic pain syndrome: Secondary | ICD-10-CM | POA: Diagnosis not present

## 2022-09-04 DIAGNOSIS — R000 Tachycardia, unspecified: Secondary | ICD-10-CM | POA: Diagnosis not present

## 2022-09-04 DIAGNOSIS — J9612 Chronic respiratory failure with hypercapnia: Secondary | ICD-10-CM | POA: Diagnosis not present

## 2022-09-04 DIAGNOSIS — R079 Chest pain, unspecified: Secondary | ICD-10-CM | POA: Diagnosis not present

## 2022-09-04 DIAGNOSIS — S0990XA Unspecified injury of head, initial encounter: Secondary | ICD-10-CM | POA: Diagnosis not present

## 2022-09-04 DIAGNOSIS — J9611 Chronic respiratory failure with hypoxia: Secondary | ICD-10-CM | POA: Diagnosis not present

## 2022-09-04 DIAGNOSIS — Z86711 Personal history of pulmonary embolism: Secondary | ICD-10-CM | POA: Diagnosis not present

## 2022-09-04 DIAGNOSIS — E039 Hypothyroidism, unspecified: Secondary | ICD-10-CM | POA: Diagnosis not present

## 2022-09-04 DIAGNOSIS — S3991XA Unspecified injury of abdomen, initial encounter: Secondary | ICD-10-CM | POA: Diagnosis not present

## 2022-09-04 DIAGNOSIS — M542 Cervicalgia: Secondary | ICD-10-CM | POA: Diagnosis not present

## 2022-09-04 DIAGNOSIS — Z79899 Other long term (current) drug therapy: Secondary | ICD-10-CM | POA: Diagnosis not present

## 2022-09-04 DIAGNOSIS — I110 Hypertensive heart disease with heart failure: Secondary | ICD-10-CM | POA: Diagnosis not present

## 2022-09-04 DIAGNOSIS — I4891 Unspecified atrial fibrillation: Secondary | ICD-10-CM | POA: Diagnosis not present

## 2022-09-04 DIAGNOSIS — J90 Pleural effusion, not elsewhere classified: Secondary | ICD-10-CM | POA: Diagnosis not present

## 2022-09-04 DIAGNOSIS — Z993 Dependence on wheelchair: Secondary | ICD-10-CM | POA: Diagnosis not present

## 2022-09-04 DIAGNOSIS — Z79891 Long term (current) use of opiate analgesic: Secondary | ICD-10-CM | POA: Diagnosis not present

## 2022-09-04 DIAGNOSIS — I639 Cerebral infarction, unspecified: Secondary | ICD-10-CM | POA: Diagnosis not present

## 2022-09-04 DIAGNOSIS — S3993XA Unspecified injury of pelvis, initial encounter: Secondary | ICD-10-CM | POA: Diagnosis not present

## 2022-09-04 DIAGNOSIS — I5032 Chronic diastolic (congestive) heart failure: Secondary | ICD-10-CM | POA: Diagnosis not present

## 2022-09-04 DIAGNOSIS — I510 Cardiac septal defect, acquired: Secondary | ICD-10-CM | POA: Diagnosis not present

## 2022-09-04 DIAGNOSIS — Z7901 Long term (current) use of anticoagulants: Secondary | ICD-10-CM | POA: Diagnosis not present

## 2022-09-05 DIAGNOSIS — I4891 Unspecified atrial fibrillation: Secondary | ICD-10-CM | POA: Diagnosis not present

## 2022-09-05 DIAGNOSIS — J9612 Chronic respiratory failure with hypercapnia: Secondary | ICD-10-CM | POA: Diagnosis not present

## 2022-09-05 DIAGNOSIS — J9611 Chronic respiratory failure with hypoxia: Secondary | ICD-10-CM | POA: Diagnosis not present

## 2022-09-06 DIAGNOSIS — I4891 Unspecified atrial fibrillation: Secondary | ICD-10-CM | POA: Diagnosis not present

## 2022-09-06 DIAGNOSIS — J9611 Chronic respiratory failure with hypoxia: Secondary | ICD-10-CM | POA: Diagnosis not present

## 2022-09-06 DIAGNOSIS — J9612 Chronic respiratory failure with hypercapnia: Secondary | ICD-10-CM | POA: Diagnosis not present

## 2022-09-09 ENCOUNTER — Ambulatory Visit: Payer: Medicare Other | Admitting: Internal Medicine

## 2022-09-09 ENCOUNTER — Encounter: Payer: Self-pay | Admitting: Internal Medicine

## 2022-09-09 VITALS — BP 110/70 | HR 80 | Temp 98.0°F | Resp 20 | Ht 63.0 in | Wt 125.1 lb

## 2022-09-09 DIAGNOSIS — J9690 Respiratory failure, unspecified, unspecified whether with hypoxia or hypercapnia: Secondary | ICD-10-CM | POA: Insufficient documentation

## 2022-09-09 DIAGNOSIS — J9611 Chronic respiratory failure with hypoxia: Secondary | ICD-10-CM

## 2022-09-09 DIAGNOSIS — I480 Paroxysmal atrial fibrillation: Secondary | ICD-10-CM

## 2022-09-09 DIAGNOSIS — R262 Difficulty in walking, not elsewhere classified: Secondary | ICD-10-CM

## 2022-09-09 DIAGNOSIS — F419 Anxiety disorder, unspecified: Secondary | ICD-10-CM

## 2022-09-09 DIAGNOSIS — I5032 Chronic diastolic (congestive) heart failure: Secondary | ICD-10-CM

## 2022-09-09 MED ORDER — LORAZEPAM 0.5 MG PO TABS: 0.5000 mg | ORAL_TABLET | Freq: Every day | ORAL | 1 refills | Status: DC | PRN

## 2022-09-09 NOTE — Assessment & Plan Note (Signed)
She is on bumetanide 4 mg twice a day and lasix 40 mg daily

## 2022-09-09 NOTE — Assessment & Plan Note (Signed)
She is on 2 L oxygen vis nasal canula.

## 2022-09-09 NOTE — Assessment & Plan Note (Signed)
She will get PT for balance and strength

## 2022-09-09 NOTE — Progress Notes (Signed)
Office Visit  Subjective   Patient ID: Courtney Grant   DOB: 09-24-1932   Age: 89 y.o.   MRN: [id number]   Chief Complaint Chief Complaint  Patient presents with   Follow-up    Hospital follow up     History of Present Illness 89 years old female who was admitted to [facility] on 6/21 with increased SOB due to AF with RVR, she was started on Cardizem IN and then change to Cardizem 180 mg daily and metoprolol was decreased to 25 mg daily. She was also treated for [facility] with IV lasix. She was started on lasix PO. I have reviewed her discharge summary and hospital notes. I have reviewed her medications with her grandson.  She was evaluated by PT who recommended out patient PT. She was discharge home on 6/23.   She has chronic back and she follows with cardiac Welton Flakes and have tele health with him.  Per grand son that her nerves get shot and she started shaking, she use to take alprazolam and that did help her a lot, grand son says he only give her 1/2 the tablet 0.25 mg and that does help.   She is on 2 L oxygen via nasal canula and she has home oxygen concentrator.    Past Medical History Past Medical History:  Diagnosis Date   Abnormal transaminases    Arthritis 01/29/2016   Overview:  Generalized   Atherosclerosis of native artery of both lower extremities with intermittent claudication (HCC) 10/13/2016   Formatting of this note might be different from the original. Added automatically from request for surgery [phone]   Atrial fibrillation (HCC)    Bacteremia due to methicillin susceptible Staphylococcus aureus (MSSA) 10/30/2017   Barrett's esophagus    Benign paroxysmal positional vertigo 10/25/2015   Biliary dyskinesia 01/29/2016   Cancer of lung (HCC)    ADENOCARINOMA RUL  Overview:  Overview:  ADENOCARINOMA RUL   Chronic back pain 10/30/2017   Chronic CHF (congestive heart failure) (HCC)    Complication of anesthesia    COPD (chronic obstructive pulmonary disease) (HCC)     Depression 01/29/2016   Fibromyalgia 01/29/2016   Hiatal hernia with GERD 01/29/2016   History of bleeding peptic ulcer 10/30/2017   History of pulmonary embolism 01/29/2016   Overview:  x2   Hyperlipidemia    Hypertension 01/29/2016   Hypokalemia    Normocytic anemia 10/30/2017   O2 dependent    Occlusion of right femoral-popliteal bypass graft (HCC) 10/30/2017   PONV (postoperative nausea and vomiting)    Pulmonary embolism (HCC) 09/13/2020   Formatting of this note might be different from the original. x2   Septic arthritis of knee, right (HCC) 10/30/2017   Stroke (HCC) 01/29/2016   Overview:  Date of stroke is unknown.  "cerebellum"   Tobacco abuse    Urgency incontinence 01/29/2016     Allergies Allergies  Allergen Reactions   Ambien [Zolpidem] Other (See Comments)    GI problems    Aspirin Nausea And Vomiting   Codeine Nausea And Vomiting   Erythromycin Other (See Comments)    Dizziness   Ibuprofen Nausea And Vomiting   Penicillins Rash    Has patient had a PCN reaction causing immediate rash, facial/tongue/throat swelling, SOB or lightheadedness with hypotension: YES Has patient had a PCN reaction causing severe rash involving mucus membranes or skin necrosis: NO Has patient had a PCN reaction that required hospitalization: NO Has patient had a PCN reaction occurring within the  last 10 years: YES If all of the above answers are "NO", then may proceed with Cephalosporin use.     Review of Systems Review of Systems  Constitutional: Negative.   HENT: Negative.    Respiratory:  Positive for shortness of breath.   Cardiovascular: Negative.   Gastrointestinal: Negative.   Neurological: Negative.   Psychiatric/Behavioral:  The patient is nervous/anxious.        Objective:    Vitals BP 110/70 (BP Location: Left Arm, Patient Position: Sitting, Cuff Size: Normal)   Pulse 80   Temp 98 F (36.7 C)   Resp 20   Ht 5\' 3"  (1.6 m)   Wt 125 lb 2 oz (56.8 kg)    SpO2 95%   BMI 22.16 kg/m    Physical Examination Physical Exam Constitutional:      Appearance: Normal appearance.  HENT:     Head: Normocephalic and atraumatic.  Cardiovascular:     Rate and Rhythm: Normal rate and regular rhythm.     Heart sounds: Normal heart sounds.  Pulmonary:     Effort: Pulmonary effort is normal.     Breath sounds: Normal breath sounds.  Abdominal:     General: Bowel sounds are normal.     Palpations: Abdomen is soft.  Neurological:     General: No focal deficit present.     Mental Status: She is alert and oriented to person, place, and time.        Assessment & Plan:   Chronic CHF (congestive heart failure) (HCC) She is on bumetanide 4 mg twice a day and lasix 40 mg daily  Atrial fibrillation (HCC) She take cardizem and metoprolol and eliquis as anticoagulation.  Respiratory failure (HCC) She is on 2 L oxygen vis nasal canula.   Anxiety She was suppose to take remeron 30 mg at night and busperone 15 mg three time a day, garndson says sometime she need something extra.   Ambulatory dysfunction She will get PT for balance and strength  This is a transition of care visit, I have reviewed medications with grandson.   Return in about 3 months (around 12/10/2022).   Eloisa Northern, MD

## 2022-09-09 NOTE — Assessment & Plan Note (Signed)
She take cardizem and metoprolol and eliquis as anticoagulation.

## 2022-09-09 NOTE — Assessment & Plan Note (Signed)
She was suppose to take remeron 30 mg at night and busperone 15 mg three time a day, garndson says sometime she need something extra.

## 2022-09-10 DIAGNOSIS — K449 Diaphragmatic hernia without obstruction or gangrene: Secondary | ICD-10-CM | POA: Diagnosis not present

## 2022-09-10 DIAGNOSIS — M797 Fibromyalgia: Secondary | ICD-10-CM | POA: Diagnosis not present

## 2022-09-10 DIAGNOSIS — M545 Low back pain, unspecified: Secondary | ICD-10-CM | POA: Diagnosis not present

## 2022-09-10 DIAGNOSIS — J9611 Chronic respiratory failure with hypoxia: Secondary | ICD-10-CM | POA: Diagnosis not present

## 2022-09-10 DIAGNOSIS — I70213 Atherosclerosis of native arteries of extremities with intermittent claudication, bilateral legs: Secondary | ICD-10-CM | POA: Diagnosis not present

## 2022-09-10 DIAGNOSIS — J431 Panlobular emphysema: Secondary | ICD-10-CM | POA: Diagnosis not present

## 2022-09-10 DIAGNOSIS — M1990 Unspecified osteoarthritis, unspecified site: Secondary | ICD-10-CM | POA: Diagnosis not present

## 2022-09-10 DIAGNOSIS — J9612 Chronic respiratory failure with hypercapnia: Secondary | ICD-10-CM | POA: Diagnosis not present

## 2022-09-10 DIAGNOSIS — K219 Gastro-esophageal reflux disease without esophagitis: Secondary | ICD-10-CM | POA: Diagnosis not present

## 2022-09-10 DIAGNOSIS — K22719 Barrett's esophagus with dysplasia, unspecified: Secondary | ICD-10-CM | POA: Diagnosis not present

## 2022-09-10 DIAGNOSIS — G894 Chronic pain syndrome: Secondary | ICD-10-CM | POA: Diagnosis not present

## 2022-09-10 DIAGNOSIS — I110 Hypertensive heart disease with heart failure: Secondary | ICD-10-CM | POA: Diagnosis not present

## 2022-09-10 DIAGNOSIS — E782 Mixed hyperlipidemia: Secondary | ICD-10-CM | POA: Diagnosis not present

## 2022-09-10 DIAGNOSIS — E039 Hypothyroidism, unspecified: Secondary | ICD-10-CM | POA: Diagnosis not present

## 2022-09-10 DIAGNOSIS — H8110 Benign paroxysmal vertigo, unspecified ear: Secondary | ICD-10-CM | POA: Diagnosis not present

## 2022-09-10 DIAGNOSIS — Z7901 Long term (current) use of anticoagulants: Secondary | ICD-10-CM | POA: Diagnosis not present

## 2022-09-10 DIAGNOSIS — F17210 Nicotine dependence, cigarettes, uncomplicated: Secondary | ICD-10-CM | POA: Diagnosis not present

## 2022-09-10 DIAGNOSIS — I5032 Chronic diastolic (congestive) heart failure: Secondary | ICD-10-CM | POA: Diagnosis not present

## 2022-09-10 DIAGNOSIS — I482 Chronic atrial fibrillation, unspecified: Secondary | ICD-10-CM | POA: Diagnosis not present

## 2022-09-10 DIAGNOSIS — Z9981 Dependence on supplemental oxygen: Secondary | ICD-10-CM | POA: Diagnosis not present

## 2022-09-10 DIAGNOSIS — F0284 Dementia in other diseases classified elsewhere, unspecified severity, with anxiety: Secondary | ICD-10-CM | POA: Diagnosis not present

## 2022-09-10 DIAGNOSIS — D649 Anemia, unspecified: Secondary | ICD-10-CM | POA: Diagnosis not present

## 2022-09-10 DIAGNOSIS — F0283 Dementia in other diseases classified elsewhere, unspecified severity, with mood disturbance: Secondary | ICD-10-CM | POA: Diagnosis not present

## 2022-09-10 DIAGNOSIS — K828 Other specified diseases of gallbladder: Secondary | ICD-10-CM | POA: Diagnosis not present

## 2022-09-12 DIAGNOSIS — J9621 Acute and chronic respiratory failure with hypoxia: Secondary | ICD-10-CM | POA: Diagnosis not present

## 2022-09-12 DIAGNOSIS — J449 Chronic obstructive pulmonary disease, unspecified: Secondary | ICD-10-CM | POA: Diagnosis not present

## 2022-09-14 DIAGNOSIS — I482 Chronic atrial fibrillation, unspecified: Secondary | ICD-10-CM | POA: Diagnosis not present

## 2022-09-14 DIAGNOSIS — M1990 Unspecified osteoarthritis, unspecified site: Secondary | ICD-10-CM | POA: Diagnosis not present

## 2022-09-14 DIAGNOSIS — J9612 Chronic respiratory failure with hypercapnia: Secondary | ICD-10-CM | POA: Diagnosis not present

## 2022-09-14 DIAGNOSIS — J431 Panlobular emphysema: Secondary | ICD-10-CM | POA: Diagnosis not present

## 2022-09-14 DIAGNOSIS — Z9981 Dependence on supplemental oxygen: Secondary | ICD-10-CM | POA: Diagnosis not present

## 2022-09-14 DIAGNOSIS — I70213 Atherosclerosis of native arteries of extremities with intermittent claudication, bilateral legs: Secondary | ICD-10-CM | POA: Diagnosis not present

## 2022-09-14 DIAGNOSIS — K449 Diaphragmatic hernia without obstruction or gangrene: Secondary | ICD-10-CM | POA: Diagnosis not present

## 2022-09-14 DIAGNOSIS — J9611 Chronic respiratory failure with hypoxia: Secondary | ICD-10-CM | POA: Diagnosis not present

## 2022-09-14 DIAGNOSIS — I110 Hypertensive heart disease with heart failure: Secondary | ICD-10-CM | POA: Diagnosis not present

## 2022-09-14 DIAGNOSIS — K22719 Barrett's esophagus with dysplasia, unspecified: Secondary | ICD-10-CM | POA: Diagnosis not present

## 2022-09-14 DIAGNOSIS — F17210 Nicotine dependence, cigarettes, uncomplicated: Secondary | ICD-10-CM | POA: Diagnosis not present

## 2022-09-14 DIAGNOSIS — E039 Hypothyroidism, unspecified: Secondary | ICD-10-CM | POA: Diagnosis not present

## 2022-09-14 DIAGNOSIS — Z7901 Long term (current) use of anticoagulants: Secondary | ICD-10-CM | POA: Diagnosis not present

## 2022-09-14 DIAGNOSIS — G894 Chronic pain syndrome: Secondary | ICD-10-CM | POA: Diagnosis not present

## 2022-09-14 DIAGNOSIS — F0284 Dementia in other diseases classified elsewhere, unspecified severity, with anxiety: Secondary | ICD-10-CM | POA: Diagnosis not present

## 2022-09-14 DIAGNOSIS — M797 Fibromyalgia: Secondary | ICD-10-CM | POA: Diagnosis not present

## 2022-09-14 DIAGNOSIS — K828 Other specified diseases of gallbladder: Secondary | ICD-10-CM | POA: Diagnosis not present

## 2022-09-14 DIAGNOSIS — F0283 Dementia in other diseases classified elsewhere, unspecified severity, with mood disturbance: Secondary | ICD-10-CM | POA: Diagnosis not present

## 2022-09-14 DIAGNOSIS — E782 Mixed hyperlipidemia: Secondary | ICD-10-CM | POA: Diagnosis not present

## 2022-09-14 DIAGNOSIS — H8110 Benign paroxysmal vertigo, unspecified ear: Secondary | ICD-10-CM | POA: Diagnosis not present

## 2022-09-14 DIAGNOSIS — M545 Low back pain, unspecified: Secondary | ICD-10-CM | POA: Diagnosis not present

## 2022-09-14 DIAGNOSIS — I5032 Chronic diastolic (congestive) heart failure: Secondary | ICD-10-CM | POA: Diagnosis not present

## 2022-09-14 DIAGNOSIS — D649 Anemia, unspecified: Secondary | ICD-10-CM | POA: Diagnosis not present

## 2022-09-14 DIAGNOSIS — K219 Gastro-esophageal reflux disease without esophagitis: Secondary | ICD-10-CM | POA: Diagnosis not present

## 2022-09-15 DIAGNOSIS — Z9981 Dependence on supplemental oxygen: Secondary | ICD-10-CM | POA: Diagnosis not present

## 2022-09-15 DIAGNOSIS — M797 Fibromyalgia: Secondary | ICD-10-CM | POA: Diagnosis not present

## 2022-09-15 DIAGNOSIS — J9611 Chronic respiratory failure with hypoxia: Secondary | ICD-10-CM | POA: Diagnosis not present

## 2022-09-15 DIAGNOSIS — J431 Panlobular emphysema: Secondary | ICD-10-CM | POA: Diagnosis not present

## 2022-09-15 DIAGNOSIS — I70213 Atherosclerosis of native arteries of extremities with intermittent claudication, bilateral legs: Secondary | ICD-10-CM | POA: Diagnosis not present

## 2022-09-15 DIAGNOSIS — E782 Mixed hyperlipidemia: Secondary | ICD-10-CM | POA: Diagnosis not present

## 2022-09-15 DIAGNOSIS — J9612 Chronic respiratory failure with hypercapnia: Secondary | ICD-10-CM | POA: Diagnosis not present

## 2022-09-15 DIAGNOSIS — M1990 Unspecified osteoarthritis, unspecified site: Secondary | ICD-10-CM | POA: Diagnosis not present

## 2022-09-15 DIAGNOSIS — D649 Anemia, unspecified: Secondary | ICD-10-CM | POA: Diagnosis not present

## 2022-09-15 DIAGNOSIS — I5032 Chronic diastolic (congestive) heart failure: Secondary | ICD-10-CM | POA: Diagnosis not present

## 2022-09-15 DIAGNOSIS — K828 Other specified diseases of gallbladder: Secondary | ICD-10-CM | POA: Diagnosis not present

## 2022-09-15 DIAGNOSIS — F0284 Dementia in other diseases classified elsewhere, unspecified severity, with anxiety: Secondary | ICD-10-CM | POA: Diagnosis not present

## 2022-09-15 DIAGNOSIS — K449 Diaphragmatic hernia without obstruction or gangrene: Secondary | ICD-10-CM | POA: Diagnosis not present

## 2022-09-15 DIAGNOSIS — E039 Hypothyroidism, unspecified: Secondary | ICD-10-CM | POA: Diagnosis not present

## 2022-09-15 DIAGNOSIS — M545 Low back pain, unspecified: Secondary | ICD-10-CM | POA: Diagnosis not present

## 2022-09-15 DIAGNOSIS — F0283 Dementia in other diseases classified elsewhere, unspecified severity, with mood disturbance: Secondary | ICD-10-CM | POA: Diagnosis not present

## 2022-09-15 DIAGNOSIS — Z7901 Long term (current) use of anticoagulants: Secondary | ICD-10-CM | POA: Diagnosis not present

## 2022-09-15 DIAGNOSIS — H8110 Benign paroxysmal vertigo, unspecified ear: Secondary | ICD-10-CM | POA: Diagnosis not present

## 2022-09-15 DIAGNOSIS — K219 Gastro-esophageal reflux disease without esophagitis: Secondary | ICD-10-CM | POA: Diagnosis not present

## 2022-09-15 DIAGNOSIS — G894 Chronic pain syndrome: Secondary | ICD-10-CM | POA: Diagnosis not present

## 2022-09-15 DIAGNOSIS — I110 Hypertensive heart disease with heart failure: Secondary | ICD-10-CM | POA: Diagnosis not present

## 2022-09-15 DIAGNOSIS — I482 Chronic atrial fibrillation, unspecified: Secondary | ICD-10-CM | POA: Diagnosis not present

## 2022-09-15 DIAGNOSIS — K22719 Barrett's esophagus with dysplasia, unspecified: Secondary | ICD-10-CM | POA: Diagnosis not present

## 2022-09-15 DIAGNOSIS — F17210 Nicotine dependence, cigarettes, uncomplicated: Secondary | ICD-10-CM | POA: Diagnosis not present

## 2022-09-16 DIAGNOSIS — J431 Panlobular emphysema: Secondary | ICD-10-CM | POA: Diagnosis not present

## 2022-09-16 DIAGNOSIS — K828 Other specified diseases of gallbladder: Secondary | ICD-10-CM | POA: Diagnosis not present

## 2022-09-16 DIAGNOSIS — I110 Hypertensive heart disease with heart failure: Secondary | ICD-10-CM | POA: Diagnosis not present

## 2022-09-16 DIAGNOSIS — M1990 Unspecified osteoarthritis, unspecified site: Secondary | ICD-10-CM | POA: Diagnosis not present

## 2022-09-16 DIAGNOSIS — I70213 Atherosclerosis of native arteries of extremities with intermittent claudication, bilateral legs: Secondary | ICD-10-CM | POA: Diagnosis not present

## 2022-09-16 DIAGNOSIS — G894 Chronic pain syndrome: Secondary | ICD-10-CM | POA: Diagnosis not present

## 2022-09-16 DIAGNOSIS — E782 Mixed hyperlipidemia: Secondary | ICD-10-CM | POA: Diagnosis not present

## 2022-09-16 DIAGNOSIS — F0283 Dementia in other diseases classified elsewhere, unspecified severity, with mood disturbance: Secondary | ICD-10-CM | POA: Diagnosis not present

## 2022-09-16 DIAGNOSIS — M797 Fibromyalgia: Secondary | ICD-10-CM | POA: Diagnosis not present

## 2022-09-16 DIAGNOSIS — J9612 Chronic respiratory failure with hypercapnia: Secondary | ICD-10-CM | POA: Diagnosis not present

## 2022-09-16 DIAGNOSIS — J9611 Chronic respiratory failure with hypoxia: Secondary | ICD-10-CM | POA: Diagnosis not present

## 2022-09-16 DIAGNOSIS — F0284 Dementia in other diseases classified elsewhere, unspecified severity, with anxiety: Secondary | ICD-10-CM | POA: Diagnosis not present

## 2022-09-16 DIAGNOSIS — Z7901 Long term (current) use of anticoagulants: Secondary | ICD-10-CM | POA: Diagnosis not present

## 2022-09-16 DIAGNOSIS — I5032 Chronic diastolic (congestive) heart failure: Secondary | ICD-10-CM | POA: Diagnosis not present

## 2022-09-16 DIAGNOSIS — F17210 Nicotine dependence, cigarettes, uncomplicated: Secondary | ICD-10-CM | POA: Diagnosis not present

## 2022-09-16 DIAGNOSIS — I482 Chronic atrial fibrillation, unspecified: Secondary | ICD-10-CM | POA: Diagnosis not present

## 2022-09-16 DIAGNOSIS — K219 Gastro-esophageal reflux disease without esophagitis: Secondary | ICD-10-CM | POA: Diagnosis not present

## 2022-09-16 DIAGNOSIS — K22719 Barrett's esophagus with dysplasia, unspecified: Secondary | ICD-10-CM | POA: Diagnosis not present

## 2022-09-16 DIAGNOSIS — D649 Anemia, unspecified: Secondary | ICD-10-CM | POA: Diagnosis not present

## 2022-09-16 DIAGNOSIS — M545 Low back pain, unspecified: Secondary | ICD-10-CM | POA: Diagnosis not present

## 2022-09-16 DIAGNOSIS — H8110 Benign paroxysmal vertigo, unspecified ear: Secondary | ICD-10-CM | POA: Diagnosis not present

## 2022-09-16 DIAGNOSIS — Z9981 Dependence on supplemental oxygen: Secondary | ICD-10-CM | POA: Diagnosis not present

## 2022-09-16 DIAGNOSIS — E039 Hypothyroidism, unspecified: Secondary | ICD-10-CM | POA: Diagnosis not present

## 2022-09-16 DIAGNOSIS — K449 Diaphragmatic hernia without obstruction or gangrene: Secondary | ICD-10-CM | POA: Diagnosis not present

## 2022-09-18 ENCOUNTER — Ambulatory Visit: Payer: Medicare Other | Admitting: Cardiology

## 2022-09-21 ENCOUNTER — Other Ambulatory Visit: Payer: Self-pay | Admitting: Internal Medicine

## 2022-09-21 DIAGNOSIS — F0284 Dementia in other diseases classified elsewhere, unspecified severity, with anxiety: Secondary | ICD-10-CM | POA: Diagnosis not present

## 2022-09-21 DIAGNOSIS — M545 Low back pain, unspecified: Secondary | ICD-10-CM | POA: Diagnosis not present

## 2022-09-21 DIAGNOSIS — E039 Hypothyroidism, unspecified: Secondary | ICD-10-CM | POA: Diagnosis not present

## 2022-09-21 DIAGNOSIS — M797 Fibromyalgia: Secondary | ICD-10-CM | POA: Diagnosis not present

## 2022-09-21 DIAGNOSIS — J9611 Chronic respiratory failure with hypoxia: Secondary | ICD-10-CM | POA: Diagnosis not present

## 2022-09-21 DIAGNOSIS — F0283 Dementia in other diseases classified elsewhere, unspecified severity, with mood disturbance: Secondary | ICD-10-CM | POA: Diagnosis not present

## 2022-09-21 DIAGNOSIS — K219 Gastro-esophageal reflux disease without esophagitis: Secondary | ICD-10-CM | POA: Diagnosis not present

## 2022-09-21 DIAGNOSIS — E782 Mixed hyperlipidemia: Secondary | ICD-10-CM | POA: Diagnosis not present

## 2022-09-21 DIAGNOSIS — Z9981 Dependence on supplemental oxygen: Secondary | ICD-10-CM | POA: Diagnosis not present

## 2022-09-21 DIAGNOSIS — G894 Chronic pain syndrome: Secondary | ICD-10-CM | POA: Diagnosis not present

## 2022-09-21 DIAGNOSIS — F17210 Nicotine dependence, cigarettes, uncomplicated: Secondary | ICD-10-CM | POA: Diagnosis not present

## 2022-09-21 DIAGNOSIS — I70213 Atherosclerosis of native arteries of extremities with intermittent claudication, bilateral legs: Secondary | ICD-10-CM | POA: Diagnosis not present

## 2022-09-21 DIAGNOSIS — H8110 Benign paroxysmal vertigo, unspecified ear: Secondary | ICD-10-CM | POA: Diagnosis not present

## 2022-09-21 DIAGNOSIS — J9612 Chronic respiratory failure with hypercapnia: Secondary | ICD-10-CM | POA: Diagnosis not present

## 2022-09-21 DIAGNOSIS — I5032 Chronic diastolic (congestive) heart failure: Secondary | ICD-10-CM | POA: Diagnosis not present

## 2022-09-21 DIAGNOSIS — M1990 Unspecified osteoarthritis, unspecified site: Secondary | ICD-10-CM | POA: Diagnosis not present

## 2022-09-21 DIAGNOSIS — J431 Panlobular emphysema: Secondary | ICD-10-CM | POA: Diagnosis not present

## 2022-09-21 DIAGNOSIS — I482 Chronic atrial fibrillation, unspecified: Secondary | ICD-10-CM | POA: Diagnosis not present

## 2022-09-21 DIAGNOSIS — I110 Hypertensive heart disease with heart failure: Secondary | ICD-10-CM | POA: Diagnosis not present

## 2022-09-21 DIAGNOSIS — D649 Anemia, unspecified: Secondary | ICD-10-CM | POA: Diagnosis not present

## 2022-09-21 DIAGNOSIS — Z7901 Long term (current) use of anticoagulants: Secondary | ICD-10-CM | POA: Diagnosis not present

## 2022-09-21 DIAGNOSIS — K449 Diaphragmatic hernia without obstruction or gangrene: Secondary | ICD-10-CM | POA: Diagnosis not present

## 2022-09-21 DIAGNOSIS — K828 Other specified diseases of gallbladder: Secondary | ICD-10-CM | POA: Diagnosis not present

## 2022-09-21 DIAGNOSIS — K22719 Barrett's esophagus with dysplasia, unspecified: Secondary | ICD-10-CM | POA: Diagnosis not present

## 2022-09-22 ENCOUNTER — Encounter: Payer: Self-pay | Admitting: Cardiology

## 2022-09-22 ENCOUNTER — Ambulatory Visit: Payer: Medicare Other | Attending: Cardiology | Admitting: Cardiology

## 2022-09-22 VITALS — BP 118/80 | HR 67 | Ht 60.0 in | Wt 128.8 lb

## 2022-09-22 DIAGNOSIS — M1990 Unspecified osteoarthritis, unspecified site: Secondary | ICD-10-CM | POA: Diagnosis not present

## 2022-09-22 DIAGNOSIS — J431 Panlobular emphysema: Secondary | ICD-10-CM | POA: Diagnosis not present

## 2022-09-22 DIAGNOSIS — Z9981 Dependence on supplemental oxygen: Secondary | ICD-10-CM | POA: Diagnosis not present

## 2022-09-22 DIAGNOSIS — K219 Gastro-esophageal reflux disease without esophagitis: Secondary | ICD-10-CM | POA: Diagnosis not present

## 2022-09-22 DIAGNOSIS — I10 Essential (primary) hypertension: Secondary | ICD-10-CM | POA: Diagnosis not present

## 2022-09-22 DIAGNOSIS — D6859 Other primary thrombophilia: Secondary | ICD-10-CM | POA: Diagnosis not present

## 2022-09-22 DIAGNOSIS — K22719 Barrett's esophagus with dysplasia, unspecified: Secondary | ICD-10-CM | POA: Diagnosis not present

## 2022-09-22 DIAGNOSIS — M797 Fibromyalgia: Secondary | ICD-10-CM | POA: Diagnosis not present

## 2022-09-22 DIAGNOSIS — Z79899 Other long term (current) drug therapy: Secondary | ICD-10-CM | POA: Diagnosis not present

## 2022-09-22 DIAGNOSIS — G894 Chronic pain syndrome: Secondary | ICD-10-CM | POA: Diagnosis not present

## 2022-09-22 DIAGNOSIS — F0283 Dementia in other diseases classified elsewhere, unspecified severity, with mood disturbance: Secondary | ICD-10-CM | POA: Diagnosis not present

## 2022-09-22 DIAGNOSIS — E782 Mixed hyperlipidemia: Secondary | ICD-10-CM | POA: Diagnosis not present

## 2022-09-22 DIAGNOSIS — F17210 Nicotine dependence, cigarettes, uncomplicated: Secondary | ICD-10-CM | POA: Diagnosis not present

## 2022-09-22 DIAGNOSIS — E039 Hypothyroidism, unspecified: Secondary | ICD-10-CM | POA: Diagnosis not present

## 2022-09-22 DIAGNOSIS — Z7901 Long term (current) use of anticoagulants: Secondary | ICD-10-CM | POA: Diagnosis not present

## 2022-09-22 DIAGNOSIS — I482 Chronic atrial fibrillation, unspecified: Secondary | ICD-10-CM | POA: Diagnosis not present

## 2022-09-22 DIAGNOSIS — F0284 Dementia in other diseases classified elsewhere, unspecified severity, with anxiety: Secondary | ICD-10-CM | POA: Diagnosis not present

## 2022-09-22 DIAGNOSIS — D649 Anemia, unspecified: Secondary | ICD-10-CM | POA: Diagnosis not present

## 2022-09-22 DIAGNOSIS — H8110 Benign paroxysmal vertigo, unspecified ear: Secondary | ICD-10-CM | POA: Diagnosis not present

## 2022-09-22 DIAGNOSIS — I4891 Unspecified atrial fibrillation: Secondary | ICD-10-CM

## 2022-09-22 DIAGNOSIS — I5032 Chronic diastolic (congestive) heart failure: Secondary | ICD-10-CM | POA: Diagnosis not present

## 2022-09-22 DIAGNOSIS — J9612 Chronic respiratory failure with hypercapnia: Secondary | ICD-10-CM | POA: Diagnosis not present

## 2022-09-22 DIAGNOSIS — K449 Diaphragmatic hernia without obstruction or gangrene: Secondary | ICD-10-CM | POA: Diagnosis not present

## 2022-09-22 DIAGNOSIS — M545 Low back pain, unspecified: Secondary | ICD-10-CM | POA: Diagnosis not present

## 2022-09-22 DIAGNOSIS — I70213 Atherosclerosis of native arteries of extremities with intermittent claudication, bilateral legs: Secondary | ICD-10-CM | POA: Diagnosis not present

## 2022-09-22 DIAGNOSIS — K828 Other specified diseases of gallbladder: Secondary | ICD-10-CM | POA: Diagnosis not present

## 2022-09-22 DIAGNOSIS — J9611 Chronic respiratory failure with hypoxia: Secondary | ICD-10-CM | POA: Diagnosis not present

## 2022-09-22 DIAGNOSIS — I110 Hypertensive heart disease with heart failure: Secondary | ICD-10-CM | POA: Diagnosis not present

## 2022-09-22 NOTE — Patient Instructions (Signed)
Medication Instructions:  Your physician recommends that you continue on your current medications as directed. Please refer to the Current Medication list given to you today.  *If you need a refill on your cardiac medications before your next appointment, please call your pharmacy*   Lab Work:Your physician recommends that you return for lab work in: Today for BMP and CBC If you have labs (blood work) drawn today and your tests are completely normal, you will receive your results only by: MyChart Message (if you have MyChart) OR A paper copy in the mail If you have any lab test that is abnormal or we need to change your treatment, we will call you to review the results.   Testing/Procedures: NONE   Follow-Up: At [facility], you and your health needs are our priority.  As part of our continuing mission to provide you with exceptional heart care, we have created designated Provider Care Teams.  These Care Teams include your primary Cardiologist (physician) and Advanced Practice Providers (APPs -  Physician Assistants and Nurse Practitioners) who all work together to provide you with the care you need, when you need it.  We recommend signing up for the patient portal called "MyChart".  Sign up information is provided on this After Visit Summary.  MyChart is used to connect with patients for Virtual Visits (Telemedicine).  Patients are able to view lab/test results, encounter notes, upcoming appointments, etc.  Non-urgent messages can be sent to your provider as well.   To learn more about what you can do with MyChart, go to [email or web address].    Your next appointment:   3 month(s)  Provider:   Belva Crome, MD    Other Instructions

## 2022-09-22 NOTE — Progress Notes (Signed)
Cardiology Office Note:  .   Date:  09/22/2022  ID:  Courtney Grant, DOB 1932-10-06, MRN [id number] PCP: Eloisa Northern, MD  [facility] Providers Cardiologist:  None    History of Present Illness: .   Courtney Grant is a 89 y.o. female with a past medical history of atrial fibrillation, hyperlipidemia, hypertension, tobacco abuse, COPD oxygen dependent.  Recent admitted to [facility] on 09/03/2021 after suffering a fall at home, she was noted to be in A-fib RVR.  She was started on Cardizem drip and her heart rate was eventually controlled.  Labs on discharge revealed hemoglobin 9, hematocrit 28, creatinine 0.90, potassium 3.4.  Metoprolol was decreased as her blood pressure was soft.  She presents today accompanied by her son-in-law for follow-up after recent hospitalization she states she is not sure why she is here today and she is feeling overall that she typically does.  She continues to smoke approximately a pack a day.  She complains of dizziness that has been ongoing and persistent for many years.  She is most bothered by her inability to sleep, and anxiety.  We discussed that she is on BuSpar and Ativan from a cardiac perspective we would not add anything to this. She denies chest pain, palpitations, dyspnea, pnd, orthopnea, n, v, dizziness, syncope, edema, weight gain, or early satiety.   ROS: Review of Systems  Constitutional: Negative.   HENT: Negative.    Eyes: Negative.   Respiratory:  Positive for shortness of breath and wheezing.   Cardiovascular: Negative.   Gastrointestinal: Negative.   Genitourinary: Negative.   Musculoskeletal:  Positive for neck pain.  Skin: Negative.   Neurological:  Positive for dizziness.  Endo/Heme/Allergies: Negative.   Psychiatric/Behavioral:  Positive for memory loss.      Studies Reviewed: .        Cardiac Studies & Procedures       ECHOCARDIOGRAM  ECHOCARDIOGRAM COMPLETE 11/06/2017  Narrative *[place]* *[facility]* [address] [place], [place] [address] [phone]  ------------------------------------------------------------------- Transthoracic Echocardiography  Patient:    Courtney, Grant MR #:       [id number] Study Date: 11/06/2017 Gender:     F Age:        73 Height:     160 cm Weight:     74.5 kg BSA:        1.84 m^2 Pt. Status: Room:       4E26C  ORDERING     Jud, Theadora Rama D REFERRING    Elease Etienne PERFORMING   [facility], Inpatient ADMITTING    Briscoe Deutscher SONOGRAPHER  [facility] ATTENDING    Imogene Burn, [place]  cc:  ------------------------------------------------------------------- LV EF: 60% -   65%  ------------------------------------------------------------------- Indications:      Bacteremia 790.7.  ------------------------------------------------------------------- History:   PMH:   Chronic obstructive pulmonary disease.  Risk factors:  History of pulmonary embolism Cancer of the lung Current tobacco use. Hypertension.  ------------------------------------------------------------------- Study Conclusions  - Left ventricle: The cavity size was normal. Wall thickness was normal. Systolic function was normal. The estimated ejection fraction was in the range of 60% to 65%. Indeterminant diastolic function. - Aortic valve: Severely calcified annulus. Severely thickened leaflets. There was mild stenosis. There was mild regurgitation. Mean gradient (S): 13 mm Hg. Valve area (VTI): 1.73 cm^2. Valve area (Vmax): 1.61 cm^2. Valve area (Vmean): 1.74 cm^2. - Mitral valve: Mildly to moderately calcified annulus. Mildly thickened leaflets . - Atrial septum: No defect  or patent foramen ovale was identified. - Technically difficult study.  ------------------------------------------------------------------- Study data:  No prior study was available for comparison.  Study status:  Routine.  Procedure:  Technically difficult parasternal views due  to chest wall. The patient reported no pain pre or post test. Transthoracic echocardiography. Image quality was adequate. Study completion:  There were no complications. Transthoracic echocardiography.  M-mode, complete 2D, spectral Doppler, and color Doppler.  Birthdate:  Patient birthdate: June 18, 1932.  Age:  Patient is 89 yr old.  Sex:  Gender: female. BMI: 29.1 kg/m^2.  Blood pressure:     126/63  Patient status: Inpatient.  Study date:  Study date: 11/06/2017. Study time: 02:04 PM.  Location:  Bedside.  -------------------------------------------------------------------  ------------------------------------------------------------------- Left ventricle:  The cavity size was normal. Wall thickness was normal. Systolic function was normal. The estimated ejection fraction was in the range of 60% to 65%. Indeterminant diastolic function. Images were inadequate for LV wall motion assessment.  ------------------------------------------------------------------- Aortic valve:   Severely calcified annulus. Severely thickened leaflets.  Doppler:   There was mild stenosis.   There was mild regurgitation.    VTI ratio of LVOT to aortic valve: 0.68. Valve area (VTI): 1.73 cm^2. Indexed valve area (VTI): 0.94 cm^2/m^2. Peak velocity ratio of LVOT to aortic valve: 0.63. Valve area (Vmax): 1.61 cm^2. Indexed valve area (Vmax): 0.88 cm^2/m^2. Mean velocity ratio of LVOT to aortic valve: 0.68. Valve area (Vmean): 1.74 cm^2. Indexed valve area (Vmean): 0.94 cm^2/m^2.    Mean gradient (S): 13 mm Hg. Peak gradient (S): 24 mm Hg.  ------------------------------------------------------------------- Aorta:  Aortic root: The aortic root was normal in size.  ------------------------------------------------------------------- Mitral valve:   Mildly to moderately calcified annulus. Mildly thickened leaflets .  Doppler:   There was no evidence for stenosis.   There was no significant regurgitation.     Valve area by pressure half-time: 3.28 cm^2. Indexed valve area by pressure half-time: 1.78 cm^2/m^2.    Peak gradient (D): 5 mm Hg.  ------------------------------------------------------------------- Left atrium:  The atrium was normal in size.  ------------------------------------------------------------------- Atrial septum:  No defect or patent foramen ovale was identified.  ------------------------------------------------------------------- Right ventricle:  The cavity size was normal. Wall thickness was normal. Systolic function was normal.  ------------------------------------------------------------------- Pulmonic valve:   Not well visualized.  Doppler:   There was no evidence for stenosis.   There was no significant regurgitation.  ------------------------------------------------------------------- Tricuspid valve:   Normal thickness leaflets.  Doppler:   There was no evidence for stenosis.   There was trivial regurgitation.  ------------------------------------------------------------------- Pulmonary artery:    Systolic pressure could not be accurately estimated.   Inadequate TR jet.  ------------------------------------------------------------------- Right atrium:  The atrium was normal in size.  ------------------------------------------------------------------- Pericardium:  There was no pericardial effusion.  ------------------------------------------------------------------- Systemic veins: Inferior vena cava: The vessel was normal in size. The respirophasic diameter changes were in the normal range (>= 50%), consistent with normal central venous pressure.  ------------------------------------------------------------------- Measurements  Left ventricle                           Value          Reference LV ID, ED, PLAX chordal                  45    mm       43 - 52 LV ID, ES, PLAX chordal  28    mm       23 - 38 LV fx shortening, PLAX  chordal           38    %        >=29 LV PW thickness, ED                      8     mm       ---------- IVS/LV PW ratio, ED                      1              <=1.3 LV e&', lateral                           10.4  cm/s     ---------- LV E/e&', lateral                         11.25          ---------- LV e&', medial                            7.62  cm/s     ---------- LV E/e&', medial                          15.35          ---------- LV e&', average                           9.01  cm/s     ---------- LV E/e&', average                         12.99          ----------  Ventricular septum                       Value          Reference IVS thickness, ED                        8     mm       ----------  LVOT                                     Value          Reference LVOT ID, S                               18    mm       ---------- LVOT area                                2.54  cm^2     ---------- LVOT peak velocity, S                    156   cm/s     ---------- LVOT mean velocity, S  114   cm/s     ---------- LVOT VTI, S                              32.3  cm       ---------- LVOT peak gradient, S                    10    mm Hg    ----------  Aortic valve                             Value          Reference Aortic valve peak velocity, S            246   cm/s     ---------- Aortic valve mean velocity, S            167   cm/s     ---------- Aortic valve VTI, S                      47.6  cm       ---------- Aortic mean gradient, S                  13    mm Hg    ---------- Aortic peak gradient, S                  24    mm Hg    ---------- VTI ratio, LVOT/AV                       0.68           ---------- Aortic valve area, VTI                   1.73  cm^2     ---------- Aortic valve area/bsa, VTI               0.94  cm^2/m^2 ---------- Velocity ratio, peak, LVOT/AV            0.63           ---------- Aortic valve area, peak velocity         1.61  cm^2      ---------- Aortic valve area/bsa, peak              0.88  cm^2/m^2 ---------- velocity Velocity ratio, mean, LVOT/AV            0.68           ---------- Aortic valve area, mean velocity         1.74  cm^2     ---------- Aortic valve area/bsa, mean              0.94  cm^2/m^2 ---------- velocity  Aorta                                    Value          Reference Aortic root ID, ED                       26    mm       ----------  Left atrium  Value          Reference LA volume, S                             53.2  ml       ---------- LA volume/bsa, S                         28.9  ml/m^2   ---------- LA volume, ES, 1-p A4C                   45.8  ml       ---------- LA volume/bsa, ES, 1-p A4C               24.8  ml/m^2   ---------- LA volume, ES, 1-p A2C                   61.6  ml       ---------- LA volume/bsa, ES, 1-p A2C               33.4  ml/m^2   ----------  Mitral valve                             Value          Reference Mitral E-wave peak velocity              117   cm/s     ---------- Mitral A-wave peak velocity              136   cm/s     ---------- Mitral deceleration time                 230   ms       150 - 230 Mitral pressure half-time                67    ms       ---------- Mitral peak gradient, D                  5     mm Hg    ---------- Mitral E/A ratio, peak                   0.9            ---------- Mitral valve area, PHT, DP               3.28  cm^2     ---------- Mitral valve area/bsa, PHT, DP           1.78  cm^2/m^2 ----------  Right atrium                             Value          Reference RA ID, S-I, ES, A4C                      47.6  mm       34 - 49 RA area, ES, A4C                         13.9  cm^2     8.3 - 19.5 RA volume, ES, A/L  33.9  ml       ---------- RA volume/bsa, ES, A/L                   18.4  ml/m^2   ----------  Systemic veins                           Value          Reference Estimated  CVP                            3     mm Hg    ----------  Right ventricle                          Value          Reference TAPSE                                    18    mm       ---------- RV s&', lateral, S                        14.7  cm/s     ----------  Legend: (L)  and  (H)  mark values outside specified reference range.  ------------------------------------------------------------------- Prepared and Electronically Authenticated by  Patrick Jupiter, M.D. 2019-08-24T16:22:33   TEE  ECHO TEE 11/10/2017  Narrative *[place]* *[facility]* [address] [place], [place] [address] [phone]  ------------------------------------------------------------------- Transesophageal Echocardiography  Patient:    Ellody, Weltzin MR #:       [id number] Study Date: 11/10/2017 Gender:     F Age:        88 Height:     160 cm Weight:     70.9 kg BSA:        1.79 m^2 Pt. Status: Room:       4E26C  PERFORMING   Kristeen Miss, M.D. ATTENDING    Elgergawy, Dawood S ADMITTING    Opyd, Wendie Agreste SONOGRAPHER  Sinda Du, RDCS  cc:  ------------------------------------------------------------------- LV EF: 55% -   60%  ------------------------------------------------------------------- Indications:      Bacteremia 790.7.  ------------------------------------------------------------------- History:   PMH:   Stroke.  Bacteremia.  Chronic obstructive pulmonary disease.  Risk factors:  Current tobacco use. Hypertension.  ------------------------------------------------------------------- Study Conclusions  - Left ventricle: Systolic function was normal. The estimated ejection fraction was in the range of 55% to 60%. - Aortic valve: Cusp separation was moderately reduced. There was trivial regurgitation. - Mitral valve: There was mild regurgitation. - Left atrium: No evidence of thrombus in  the atrial cavity or appendage. - Tricuspid valve: No evidence of vegetation.  ------------------------------------------------------------------- Study data:   Study status:  Routine.  Consent:  The risks, benefits, and alternatives to the procedure were explained to the patient and informed consent was obtained.  Procedure:  Initial setup. The patient was brought to the laboratory. Surface ECG leads were monitored. Sedation. Conscious sedation was administered by cardiology staff. Transesophageal echocardiography. Topical anesthesia was obtained using viscous lidocaine. An adult multiplane transesophageal probe was inserted by the attending cardiologistwithout difficulty. Image quality was adequate.  Study completion:  The patient tolerated the  procedure well. There were no complications.  Administered medications:   Fentanyl, , IV. Midazolam, 5mg , IV.          Diagnostic transesophageal echocardiography.  2D and color Doppler.  Birthdate:  Patient birthdate: [birth year].  Age:  Patient is 89 yr old.  Sex:  Gender: female.    BMI: 27.7 kg/m^2.  Blood pressure:     145/64  Patient status:  Inpatient.  Study date:  Study date: 11/10/2017. Study time: 02:31 PM.  Location:  Endoscopy.  -------------------------------------------------------------------  ------------------------------------------------------------------- Left ventricle:  Systolic function was normal. The estimated ejection fraction was in the range of 55% to 60%.  ------------------------------------------------------------------- Aortic valve:   Mildly thickened, mildly calcified leaflets. Cusp separation was moderately reduced.  Doppler:  There was trivial regurgitation.  ------------------------------------------------------------------- Aorta:  The aorta was mildly calcified and moderately diseased.  ------------------------------------------------------------------- Mitral valve:   Mildly thickened  leaflets .  Doppler:  There was mild regurgitation.  ------------------------------------------------------------------- Left atrium:   No evidence of thrombus in the atrial cavity or appendage.  ------------------------------------------------------------------- Tricuspid valve:   Structurally normal valve.   Leaflet separation was normal.  No evidence of vegetation.  Doppler:  There was trivial regurgitation.  ------------------------------------------------------------------- Post procedure conclusions Ascending Aorta:  - The aorta was mildly calcified and moderately diseased.  ------------------------------------------------------------------- Prepared and Electronically Authenticated by  Kristeen Miss, M.D. 2019-08-28T17:31:11   MONITORS  LONG TERM MONITOR (3-14 DAYS) 03/11/2021  Narrative Patch Wear Time:  12 days and 16 hours (2022-11-29T16:22:19-0500 to 2022-12-12T09:07:16-0500)  Atrial Fibrillation occurred continuously (100% burden), ranging from 67-172 bpm (avg of 97 bpm). Isolated VEs were rare (<1.0%), and no VE Couplets or VE Triplets were present.  Impression: Atrial fibrillation.  Patient was in this rhythm all the time.  Average heart rate was 97/min.           Risk Assessment/Calculations:    CHA2DS2-VASc Score = 4   This indicates a 4.8% annual risk of stroke. The patient's score is based upon: CHF History: 0 HTN History: 1 Diabetes History: 0 Stroke History: 0 Vascular Disease History: 0 Age Score: 2 Gender Score: 1            Physical Exam:   VS:  BP 118/80 (BP Location: Left Arm, Patient Position: Sitting, Cuff Size: Normal)   Pulse 67   Ht 5' (1.524 m)   Wt 128 lb 12.8 oz (58.4 kg)   SpO2 98%   BMI 25.15 kg/m    Wt Readings from Last 3 Encounters:  09/22/22 128 lb 12.8 oz (58.4 kg)  09/09/22 125 lb 2 oz (56.8 kg)  07/20/22 125 lb (56.7 kg)    GEN: Well nourished, well developed in no acute distress NECK: No JVD; No carotid  bruits CARDIAC: irregularly irregular, no murmurs, rubs, gallops RESPIRATORY: Expiratory wheezing noted throughout ABDOMEN: Soft, non-tender, non-distended EXTREMITIES:  No edema; No deformity   ASSESSMENT AND PLAN: .   Atrial fibrillation/hypercoagulable state-rate is controlled today, she is in atrial fibrillation, CHA2DS2-VASc score of 4.  Will reduce her Eliquis to 2.5 mg daily (89 yo, wt 128 lbs).  Continue Cardizem 180 mg daily, continue Toprol 25 mg daily. Repeat CBC, BMET.   Hypertension-blood pressure is well-controlled today at 118/80, continue Cardizem 180 mg daily, continue Toprol 25 mg daily.  COPD-on continuous oxygen at 3.5 L/min, followed by pulmonology.  She continues to smoke 1 PPD, has no desire to quit, we did discuss safety precautions including not smoking while she is wearing her oxygen.  Anxiety - continued to express she is anxious, she is on Ativan and Buspar per our medication record.        Dispo: CBC, BMET, return in 3 months.   Signed, Flossie Dibble, NP

## 2022-09-23 ENCOUNTER — Other Ambulatory Visit: Payer: Self-pay | Admitting: Internal Medicine

## 2022-09-23 ENCOUNTER — Telehealth: Payer: Self-pay

## 2022-09-23 ENCOUNTER — Other Ambulatory Visit: Payer: Self-pay

## 2022-09-23 LAB — BASIC METABOLIC PANEL WITH GFR
BUN/Creatinine Ratio: 24 (ref 12–28)
BUN: 26 mg/dL (ref 8–27)
CO2: 34 mmol/L — ABNORMAL HIGH (ref 20–29)
Calcium: 9.8 mg/dL (ref 8.7–10.3)
Chloride: 92 mmol/L — ABNORMAL LOW (ref 96–106)
Creatinine, Ser: 1.07 mg/dL — ABNORMAL HIGH (ref 0.57–1.00)
Glucose: 85 mg/dL (ref 70–99)
Potassium: 4.1 mmol/L (ref 3.5–5.2)
Sodium: 145 mmol/L — ABNORMAL HIGH (ref 134–144)
eGFR: 50 mL/min/1.73 — ABNORMAL LOW

## 2022-09-23 LAB — CBC WITH DIFFERENTIAL/PLATELET
Basophils Absolute: 0 x10E3/uL (ref 0.0–0.2)
Basos: 0 %
EOS (ABSOLUTE): 0.1 x10E3/uL (ref 0.0–0.4)
Eos: 1 %
Hematocrit: 33.1 % — ABNORMAL LOW (ref 34.0–46.6)
Hemoglobin: 10.6 g/dL — ABNORMAL LOW (ref 11.1–15.9)
Immature Grans (Abs): 0 x10E3/uL (ref 0.0–0.1)
Immature Granulocytes: 0 %
Lymphocytes Absolute: 1.3 x10E3/uL (ref 0.7–3.1)
Lymphs: 17 %
MCH: 29.4 pg (ref 26.6–33.0)
MCHC: 32 g/dL (ref 31.5–35.7)
MCV: 92 fL (ref 79–97)
Monocytes Absolute: 0.6 x10E3/uL (ref 0.1–0.9)
Monocytes: 8 %
Neutrophils Absolute: 5.4 x10E3/uL (ref 1.4–7.0)
Neutrophils: 74 %
Platelets: 226 x10E3/uL (ref 150–450)
RBC: 3.61 x10E6/uL — ABNORMAL LOW (ref 3.77–5.28)
RDW: 14.2 % (ref 11.7–15.4)
WBC: 7.4 x10E3/uL (ref 3.4–10.8)

## 2022-09-23 MED ORDER — DILTIAZEM HCL ER COATED BEADS 120 MG PO CP24: 120.0000 mg | ORAL_CAPSULE | Freq: Every day | ORAL | 3 refills | Status: DC

## 2022-09-23 MED ORDER — APIXABAN 2.5 MG PO TABS: 2.5000 mg | ORAL_TABLET | Freq: Two times a day (BID) | ORAL | 3 refills | Status: DC

## 2022-09-23 MED ORDER — IPRATROPIUM-ALBUTEROL 0.5-2.5 (3) MG/3ML IN SOLN: 3.0000 mL | Freq: Four times a day (QID) | RESPIRATORY_TRACT | 6 refills | Status: DC | PRN

## 2022-09-23 NOTE — Telephone Encounter (Signed)
Called the patients daughter and informed her of the results of the patients blood work. The daughter stated that the patients blood pressure has been running low. The latest blood pressures are: 100/50, 105/55, 110/60. The daughter was asking if there was any medication that could be decreased or removed that would help the patients blood pressure to be higher? I explained that I would forward her message to Wallis Bamberg NP for guidance.

## 2022-09-23 NOTE — Telephone Encounter (Signed)
Called Courtney Grant and informed her of Wallis Bamberg NP's recommendation below:  Decrease Cardizem to 120 mg daily.   Courtney Grant verbalized understanding and had no further questions at this time.

## 2022-09-23 NOTE — Telephone Encounter (Signed)
Left message for Lisa to call back. 

## 2022-09-24 DIAGNOSIS — K449 Diaphragmatic hernia without obstruction or gangrene: Secondary | ICD-10-CM | POA: Diagnosis not present

## 2022-09-24 DIAGNOSIS — M797 Fibromyalgia: Secondary | ICD-10-CM | POA: Diagnosis not present

## 2022-09-24 DIAGNOSIS — K22719 Barrett's esophagus with dysplasia, unspecified: Secondary | ICD-10-CM | POA: Diagnosis not present

## 2022-09-24 DIAGNOSIS — Z9981 Dependence on supplemental oxygen: Secondary | ICD-10-CM | POA: Diagnosis not present

## 2022-09-24 DIAGNOSIS — J431 Panlobular emphysema: Secondary | ICD-10-CM | POA: Diagnosis not present

## 2022-09-24 DIAGNOSIS — J9612 Chronic respiratory failure with hypercapnia: Secondary | ICD-10-CM | POA: Diagnosis not present

## 2022-09-24 DIAGNOSIS — E039 Hypothyroidism, unspecified: Secondary | ICD-10-CM | POA: Diagnosis not present

## 2022-09-24 DIAGNOSIS — K828 Other specified diseases of gallbladder: Secondary | ICD-10-CM | POA: Diagnosis not present

## 2022-09-24 DIAGNOSIS — I70213 Atherosclerosis of native arteries of extremities with intermittent claudication, bilateral legs: Secondary | ICD-10-CM | POA: Diagnosis not present

## 2022-09-24 DIAGNOSIS — I482 Chronic atrial fibrillation, unspecified: Secondary | ICD-10-CM | POA: Diagnosis not present

## 2022-09-24 DIAGNOSIS — M1990 Unspecified osteoarthritis, unspecified site: Secondary | ICD-10-CM | POA: Diagnosis not present

## 2022-09-24 DIAGNOSIS — H8110 Benign paroxysmal vertigo, unspecified ear: Secondary | ICD-10-CM | POA: Diagnosis not present

## 2022-09-24 DIAGNOSIS — K219 Gastro-esophageal reflux disease without esophagitis: Secondary | ICD-10-CM | POA: Diagnosis not present

## 2022-09-24 DIAGNOSIS — F0284 Dementia in other diseases classified elsewhere, unspecified severity, with anxiety: Secondary | ICD-10-CM | POA: Diagnosis not present

## 2022-09-24 DIAGNOSIS — G894 Chronic pain syndrome: Secondary | ICD-10-CM | POA: Diagnosis not present

## 2022-09-24 DIAGNOSIS — F0283 Dementia in other diseases classified elsewhere, unspecified severity, with mood disturbance: Secondary | ICD-10-CM | POA: Diagnosis not present

## 2022-09-24 DIAGNOSIS — J9611 Chronic respiratory failure with hypoxia: Secondary | ICD-10-CM | POA: Diagnosis not present

## 2022-09-24 DIAGNOSIS — M545 Low back pain, unspecified: Secondary | ICD-10-CM | POA: Diagnosis not present

## 2022-09-24 DIAGNOSIS — E782 Mixed hyperlipidemia: Secondary | ICD-10-CM | POA: Diagnosis not present

## 2022-09-24 DIAGNOSIS — I5032 Chronic diastolic (congestive) heart failure: Secondary | ICD-10-CM | POA: Diagnosis not present

## 2022-09-24 DIAGNOSIS — F17210 Nicotine dependence, cigarettes, uncomplicated: Secondary | ICD-10-CM | POA: Diagnosis not present

## 2022-09-24 DIAGNOSIS — Z7901 Long term (current) use of anticoagulants: Secondary | ICD-10-CM | POA: Diagnosis not present

## 2022-09-24 DIAGNOSIS — I110 Hypertensive heart disease with heart failure: Secondary | ICD-10-CM | POA: Diagnosis not present

## 2022-09-24 DIAGNOSIS — D649 Anemia, unspecified: Secondary | ICD-10-CM | POA: Diagnosis not present

## 2022-09-25 DIAGNOSIS — H8110 Benign paroxysmal vertigo, unspecified ear: Secondary | ICD-10-CM | POA: Diagnosis not present

## 2022-09-25 DIAGNOSIS — K449 Diaphragmatic hernia without obstruction or gangrene: Secondary | ICD-10-CM | POA: Diagnosis not present

## 2022-09-25 DIAGNOSIS — M545 Low back pain, unspecified: Secondary | ICD-10-CM | POA: Diagnosis not present

## 2022-09-25 DIAGNOSIS — F0283 Dementia in other diseases classified elsewhere, unspecified severity, with mood disturbance: Secondary | ICD-10-CM | POA: Diagnosis not present

## 2022-09-25 DIAGNOSIS — I5032 Chronic diastolic (congestive) heart failure: Secondary | ICD-10-CM | POA: Diagnosis not present

## 2022-09-25 DIAGNOSIS — G894 Chronic pain syndrome: Secondary | ICD-10-CM | POA: Diagnosis not present

## 2022-09-25 DIAGNOSIS — M797 Fibromyalgia: Secondary | ICD-10-CM | POA: Diagnosis not present

## 2022-09-25 DIAGNOSIS — J431 Panlobular emphysema: Secondary | ICD-10-CM | POA: Diagnosis not present

## 2022-09-25 DIAGNOSIS — D649 Anemia, unspecified: Secondary | ICD-10-CM | POA: Diagnosis not present

## 2022-09-25 DIAGNOSIS — J9611 Chronic respiratory failure with hypoxia: Secondary | ICD-10-CM | POA: Diagnosis not present

## 2022-09-25 DIAGNOSIS — I110 Hypertensive heart disease with heart failure: Secondary | ICD-10-CM | POA: Diagnosis not present

## 2022-09-25 DIAGNOSIS — K828 Other specified diseases of gallbladder: Secondary | ICD-10-CM | POA: Diagnosis not present

## 2022-09-25 DIAGNOSIS — E039 Hypothyroidism, unspecified: Secondary | ICD-10-CM | POA: Diagnosis not present

## 2022-09-25 DIAGNOSIS — I482 Chronic atrial fibrillation, unspecified: Secondary | ICD-10-CM | POA: Diagnosis not present

## 2022-09-25 DIAGNOSIS — F0284 Dementia in other diseases classified elsewhere, unspecified severity, with anxiety: Secondary | ICD-10-CM | POA: Diagnosis not present

## 2022-09-25 DIAGNOSIS — K22719 Barrett's esophagus with dysplasia, unspecified: Secondary | ICD-10-CM | POA: Diagnosis not present

## 2022-09-25 DIAGNOSIS — I70213 Atherosclerosis of native arteries of extremities with intermittent claudication, bilateral legs: Secondary | ICD-10-CM | POA: Diagnosis not present

## 2022-09-25 DIAGNOSIS — F17210 Nicotine dependence, cigarettes, uncomplicated: Secondary | ICD-10-CM | POA: Diagnosis not present

## 2022-09-25 DIAGNOSIS — M1990 Unspecified osteoarthritis, unspecified site: Secondary | ICD-10-CM | POA: Diagnosis not present

## 2022-09-25 DIAGNOSIS — E782 Mixed hyperlipidemia: Secondary | ICD-10-CM | POA: Diagnosis not present

## 2022-09-25 DIAGNOSIS — K219 Gastro-esophageal reflux disease without esophagitis: Secondary | ICD-10-CM | POA: Diagnosis not present

## 2022-09-25 DIAGNOSIS — Z9981 Dependence on supplemental oxygen: Secondary | ICD-10-CM | POA: Diagnosis not present

## 2022-09-25 DIAGNOSIS — Z7901 Long term (current) use of anticoagulants: Secondary | ICD-10-CM | POA: Diagnosis not present

## 2022-09-25 DIAGNOSIS — J9612 Chronic respiratory failure with hypercapnia: Secondary | ICD-10-CM | POA: Diagnosis not present

## 2022-09-27 DIAGNOSIS — I5032 Chronic diastolic (congestive) heart failure: Secondary | ICD-10-CM | POA: Diagnosis not present

## 2022-09-27 DIAGNOSIS — J9621 Acute and chronic respiratory failure with hypoxia: Secondary | ICD-10-CM | POA: Diagnosis not present

## 2022-09-28 ENCOUNTER — Other Ambulatory Visit: Payer: Self-pay

## 2022-09-28 DIAGNOSIS — D649 Anemia, unspecified: Secondary | ICD-10-CM | POA: Diagnosis not present

## 2022-09-28 DIAGNOSIS — M545 Low back pain, unspecified: Secondary | ICD-10-CM | POA: Diagnosis not present

## 2022-09-28 DIAGNOSIS — G894 Chronic pain syndrome: Secondary | ICD-10-CM | POA: Diagnosis not present

## 2022-09-28 DIAGNOSIS — I110 Hypertensive heart disease with heart failure: Secondary | ICD-10-CM | POA: Diagnosis not present

## 2022-09-28 DIAGNOSIS — I482 Chronic atrial fibrillation, unspecified: Secondary | ICD-10-CM | POA: Diagnosis not present

## 2022-09-28 DIAGNOSIS — F0284 Dementia in other diseases classified elsewhere, unspecified severity, with anxiety: Secondary | ICD-10-CM | POA: Diagnosis not present

## 2022-09-28 DIAGNOSIS — K22719 Barrett's esophagus with dysplasia, unspecified: Secondary | ICD-10-CM | POA: Diagnosis not present

## 2022-09-28 DIAGNOSIS — J431 Panlobular emphysema: Secondary | ICD-10-CM | POA: Diagnosis not present

## 2022-09-28 DIAGNOSIS — Z9981 Dependence on supplemental oxygen: Secondary | ICD-10-CM | POA: Diagnosis not present

## 2022-09-28 DIAGNOSIS — F0283 Dementia in other diseases classified elsewhere, unspecified severity, with mood disturbance: Secondary | ICD-10-CM | POA: Diagnosis not present

## 2022-09-28 DIAGNOSIS — I5032 Chronic diastolic (congestive) heart failure: Secondary | ICD-10-CM | POA: Diagnosis not present

## 2022-09-28 DIAGNOSIS — F17210 Nicotine dependence, cigarettes, uncomplicated: Secondary | ICD-10-CM | POA: Diagnosis not present

## 2022-09-28 DIAGNOSIS — I70213 Atherosclerosis of native arteries of extremities with intermittent claudication, bilateral legs: Secondary | ICD-10-CM | POA: Diagnosis not present

## 2022-09-28 DIAGNOSIS — K828 Other specified diseases of gallbladder: Secondary | ICD-10-CM | POA: Diagnosis not present

## 2022-09-28 DIAGNOSIS — M797 Fibromyalgia: Secondary | ICD-10-CM | POA: Diagnosis not present

## 2022-09-28 DIAGNOSIS — K449 Diaphragmatic hernia without obstruction or gangrene: Secondary | ICD-10-CM | POA: Diagnosis not present

## 2022-09-28 DIAGNOSIS — M1990 Unspecified osteoarthritis, unspecified site: Secondary | ICD-10-CM | POA: Diagnosis not present

## 2022-09-28 DIAGNOSIS — E039 Hypothyroidism, unspecified: Secondary | ICD-10-CM | POA: Diagnosis not present

## 2022-09-28 DIAGNOSIS — J9611 Chronic respiratory failure with hypoxia: Secondary | ICD-10-CM | POA: Diagnosis not present

## 2022-09-28 DIAGNOSIS — Z7901 Long term (current) use of anticoagulants: Secondary | ICD-10-CM | POA: Diagnosis not present

## 2022-09-28 DIAGNOSIS — K219 Gastro-esophageal reflux disease without esophagitis: Secondary | ICD-10-CM | POA: Diagnosis not present

## 2022-09-28 DIAGNOSIS — H8110 Benign paroxysmal vertigo, unspecified ear: Secondary | ICD-10-CM | POA: Diagnosis not present

## 2022-09-28 DIAGNOSIS — E782 Mixed hyperlipidemia: Secondary | ICD-10-CM | POA: Diagnosis not present

## 2022-09-28 DIAGNOSIS — J9612 Chronic respiratory failure with hypercapnia: Secondary | ICD-10-CM | POA: Diagnosis not present

## 2022-09-28 MED ORDER — SUCRALFATE 1 G PO TABS: 1.0000 g | ORAL_TABLET | Freq: Four times a day (QID) | ORAL | 1 refills | Status: DC

## 2022-09-28 MED ORDER — DICYCLOMINE HCL 20 MG PO TABS: 20.0000 mg | ORAL_TABLET | Freq: Three times a day (TID) | ORAL | 1 refills | Status: DC | PRN

## 2022-09-29 DIAGNOSIS — I482 Chronic atrial fibrillation, unspecified: Secondary | ICD-10-CM | POA: Diagnosis not present

## 2022-09-29 DIAGNOSIS — K828 Other specified diseases of gallbladder: Secondary | ICD-10-CM | POA: Diagnosis not present

## 2022-09-29 DIAGNOSIS — M545 Low back pain, unspecified: Secondary | ICD-10-CM | POA: Diagnosis not present

## 2022-09-29 DIAGNOSIS — F0284 Dementia in other diseases classified elsewhere, unspecified severity, with anxiety: Secondary | ICD-10-CM | POA: Diagnosis not present

## 2022-09-29 DIAGNOSIS — F0283 Dementia in other diseases classified elsewhere, unspecified severity, with mood disturbance: Secondary | ICD-10-CM | POA: Diagnosis not present

## 2022-09-29 DIAGNOSIS — K449 Diaphragmatic hernia without obstruction or gangrene: Secondary | ICD-10-CM | POA: Diagnosis not present

## 2022-09-29 DIAGNOSIS — J431 Panlobular emphysema: Secondary | ICD-10-CM | POA: Diagnosis not present

## 2022-09-29 DIAGNOSIS — D649 Anemia, unspecified: Secondary | ICD-10-CM | POA: Diagnosis not present

## 2022-09-29 DIAGNOSIS — M797 Fibromyalgia: Secondary | ICD-10-CM | POA: Diagnosis not present

## 2022-09-29 DIAGNOSIS — H8110 Benign paroxysmal vertigo, unspecified ear: Secondary | ICD-10-CM | POA: Diagnosis not present

## 2022-09-29 DIAGNOSIS — G894 Chronic pain syndrome: Secondary | ICD-10-CM | POA: Diagnosis not present

## 2022-09-29 DIAGNOSIS — I5032 Chronic diastolic (congestive) heart failure: Secondary | ICD-10-CM | POA: Diagnosis not present

## 2022-09-29 DIAGNOSIS — I70213 Atherosclerosis of native arteries of extremities with intermittent claudication, bilateral legs: Secondary | ICD-10-CM | POA: Diagnosis not present

## 2022-09-29 DIAGNOSIS — J9611 Chronic respiratory failure with hypoxia: Secondary | ICD-10-CM | POA: Diagnosis not present

## 2022-09-29 DIAGNOSIS — Z7901 Long term (current) use of anticoagulants: Secondary | ICD-10-CM | POA: Diagnosis not present

## 2022-09-29 DIAGNOSIS — E782 Mixed hyperlipidemia: Secondary | ICD-10-CM | POA: Diagnosis not present

## 2022-09-29 DIAGNOSIS — I110 Hypertensive heart disease with heart failure: Secondary | ICD-10-CM | POA: Diagnosis not present

## 2022-09-29 DIAGNOSIS — K22719 Barrett's esophagus with dysplasia, unspecified: Secondary | ICD-10-CM | POA: Diagnosis not present

## 2022-09-29 DIAGNOSIS — J9612 Chronic respiratory failure with hypercapnia: Secondary | ICD-10-CM | POA: Diagnosis not present

## 2022-09-29 DIAGNOSIS — M1990 Unspecified osteoarthritis, unspecified site: Secondary | ICD-10-CM | POA: Diagnosis not present

## 2022-09-29 DIAGNOSIS — E039 Hypothyroidism, unspecified: Secondary | ICD-10-CM | POA: Diagnosis not present

## 2022-09-29 DIAGNOSIS — F17210 Nicotine dependence, cigarettes, uncomplicated: Secondary | ICD-10-CM | POA: Diagnosis not present

## 2022-09-29 DIAGNOSIS — K219 Gastro-esophageal reflux disease without esophagitis: Secondary | ICD-10-CM | POA: Diagnosis not present

## 2022-09-29 DIAGNOSIS — Z9981 Dependence on supplemental oxygen: Secondary | ICD-10-CM | POA: Diagnosis not present

## 2022-09-30 DIAGNOSIS — K449 Diaphragmatic hernia without obstruction or gangrene: Secondary | ICD-10-CM | POA: Diagnosis not present

## 2022-09-30 DIAGNOSIS — E782 Mixed hyperlipidemia: Secondary | ICD-10-CM | POA: Diagnosis not present

## 2022-09-30 DIAGNOSIS — E039 Hypothyroidism, unspecified: Secondary | ICD-10-CM | POA: Diagnosis not present

## 2022-09-30 DIAGNOSIS — H8110 Benign paroxysmal vertigo, unspecified ear: Secondary | ICD-10-CM | POA: Diagnosis not present

## 2022-09-30 DIAGNOSIS — J9611 Chronic respiratory failure with hypoxia: Secondary | ICD-10-CM | POA: Diagnosis not present

## 2022-09-30 DIAGNOSIS — M797 Fibromyalgia: Secondary | ICD-10-CM | POA: Diagnosis not present

## 2022-09-30 DIAGNOSIS — I110 Hypertensive heart disease with heart failure: Secondary | ICD-10-CM | POA: Diagnosis not present

## 2022-09-30 DIAGNOSIS — G894 Chronic pain syndrome: Secondary | ICD-10-CM | POA: Diagnosis not present

## 2022-09-30 DIAGNOSIS — Z7901 Long term (current) use of anticoagulants: Secondary | ICD-10-CM | POA: Diagnosis not present

## 2022-09-30 DIAGNOSIS — F0283 Dementia in other diseases classified elsewhere, unspecified severity, with mood disturbance: Secondary | ICD-10-CM | POA: Diagnosis not present

## 2022-09-30 DIAGNOSIS — F0284 Dementia in other diseases classified elsewhere, unspecified severity, with anxiety: Secondary | ICD-10-CM | POA: Diagnosis not present

## 2022-09-30 DIAGNOSIS — I5032 Chronic diastolic (congestive) heart failure: Secondary | ICD-10-CM | POA: Diagnosis not present

## 2022-09-30 DIAGNOSIS — K22719 Barrett's esophagus with dysplasia, unspecified: Secondary | ICD-10-CM | POA: Diagnosis not present

## 2022-09-30 DIAGNOSIS — K828 Other specified diseases of gallbladder: Secondary | ICD-10-CM | POA: Diagnosis not present

## 2022-09-30 DIAGNOSIS — Z9981 Dependence on supplemental oxygen: Secondary | ICD-10-CM | POA: Diagnosis not present

## 2022-09-30 DIAGNOSIS — J431 Panlobular emphysema: Secondary | ICD-10-CM | POA: Diagnosis not present

## 2022-09-30 DIAGNOSIS — F17210 Nicotine dependence, cigarettes, uncomplicated: Secondary | ICD-10-CM | POA: Diagnosis not present

## 2022-09-30 DIAGNOSIS — K219 Gastro-esophageal reflux disease without esophagitis: Secondary | ICD-10-CM | POA: Diagnosis not present

## 2022-09-30 DIAGNOSIS — I70213 Atherosclerosis of native arteries of extremities with intermittent claudication, bilateral legs: Secondary | ICD-10-CM | POA: Diagnosis not present

## 2022-09-30 DIAGNOSIS — J9612 Chronic respiratory failure with hypercapnia: Secondary | ICD-10-CM | POA: Diagnosis not present

## 2022-09-30 DIAGNOSIS — M1990 Unspecified osteoarthritis, unspecified site: Secondary | ICD-10-CM | POA: Diagnosis not present

## 2022-09-30 DIAGNOSIS — I482 Chronic atrial fibrillation, unspecified: Secondary | ICD-10-CM | POA: Diagnosis not present

## 2022-09-30 DIAGNOSIS — D649 Anemia, unspecified: Secondary | ICD-10-CM | POA: Diagnosis not present

## 2022-09-30 DIAGNOSIS — M545 Low back pain, unspecified: Secondary | ICD-10-CM | POA: Diagnosis not present

## 2022-10-02 DIAGNOSIS — H8110 Benign paroxysmal vertigo, unspecified ear: Secondary | ICD-10-CM | POA: Diagnosis not present

## 2022-10-02 DIAGNOSIS — Z9981 Dependence on supplemental oxygen: Secondary | ICD-10-CM | POA: Diagnosis not present

## 2022-10-02 DIAGNOSIS — E039 Hypothyroidism, unspecified: Secondary | ICD-10-CM | POA: Diagnosis not present

## 2022-10-02 DIAGNOSIS — I70213 Atherosclerosis of native arteries of extremities with intermittent claudication, bilateral legs: Secondary | ICD-10-CM | POA: Diagnosis not present

## 2022-10-02 DIAGNOSIS — I110 Hypertensive heart disease with heart failure: Secondary | ICD-10-CM | POA: Diagnosis not present

## 2022-10-02 DIAGNOSIS — J431 Panlobular emphysema: Secondary | ICD-10-CM | POA: Diagnosis not present

## 2022-10-02 DIAGNOSIS — K828 Other specified diseases of gallbladder: Secondary | ICD-10-CM | POA: Diagnosis not present

## 2022-10-02 DIAGNOSIS — J9612 Chronic respiratory failure with hypercapnia: Secondary | ICD-10-CM | POA: Diagnosis not present

## 2022-10-02 DIAGNOSIS — K219 Gastro-esophageal reflux disease without esophagitis: Secondary | ICD-10-CM | POA: Diagnosis not present

## 2022-10-02 DIAGNOSIS — F0283 Dementia in other diseases classified elsewhere, unspecified severity, with mood disturbance: Secondary | ICD-10-CM | POA: Diagnosis not present

## 2022-10-02 DIAGNOSIS — I5032 Chronic diastolic (congestive) heart failure: Secondary | ICD-10-CM | POA: Diagnosis not present

## 2022-10-02 DIAGNOSIS — J9611 Chronic respiratory failure with hypoxia: Secondary | ICD-10-CM | POA: Diagnosis not present

## 2022-10-02 DIAGNOSIS — I482 Chronic atrial fibrillation, unspecified: Secondary | ICD-10-CM | POA: Diagnosis not present

## 2022-10-02 DIAGNOSIS — K449 Diaphragmatic hernia without obstruction or gangrene: Secondary | ICD-10-CM | POA: Diagnosis not present

## 2022-10-02 DIAGNOSIS — D649 Anemia, unspecified: Secondary | ICD-10-CM | POA: Diagnosis not present

## 2022-10-02 DIAGNOSIS — M1990 Unspecified osteoarthritis, unspecified site: Secondary | ICD-10-CM | POA: Diagnosis not present

## 2022-10-02 DIAGNOSIS — G894 Chronic pain syndrome: Secondary | ICD-10-CM | POA: Diagnosis not present

## 2022-10-02 DIAGNOSIS — F0284 Dementia in other diseases classified elsewhere, unspecified severity, with anxiety: Secondary | ICD-10-CM | POA: Diagnosis not present

## 2022-10-02 DIAGNOSIS — M545 Low back pain, unspecified: Secondary | ICD-10-CM | POA: Diagnosis not present

## 2022-10-02 DIAGNOSIS — K22719 Barrett's esophagus with dysplasia, unspecified: Secondary | ICD-10-CM | POA: Diagnosis not present

## 2022-10-02 DIAGNOSIS — M797 Fibromyalgia: Secondary | ICD-10-CM | POA: Diagnosis not present

## 2022-10-02 DIAGNOSIS — E782 Mixed hyperlipidemia: Secondary | ICD-10-CM | POA: Diagnosis not present

## 2022-10-02 DIAGNOSIS — F17210 Nicotine dependence, cigarettes, uncomplicated: Secondary | ICD-10-CM | POA: Diagnosis not present

## 2022-10-02 DIAGNOSIS — Z7901 Long term (current) use of anticoagulants: Secondary | ICD-10-CM | POA: Diagnosis not present

## 2022-10-05 DIAGNOSIS — I5032 Chronic diastolic (congestive) heart failure: Secondary | ICD-10-CM | POA: Diagnosis not present

## 2022-10-05 DIAGNOSIS — J9612 Chronic respiratory failure with hypercapnia: Secondary | ICD-10-CM | POA: Diagnosis not present

## 2022-10-05 DIAGNOSIS — K22719 Barrett's esophagus with dysplasia, unspecified: Secondary | ICD-10-CM | POA: Diagnosis not present

## 2022-10-05 DIAGNOSIS — M545 Low back pain, unspecified: Secondary | ICD-10-CM | POA: Diagnosis not present

## 2022-10-05 DIAGNOSIS — E782 Mixed hyperlipidemia: Secondary | ICD-10-CM | POA: Diagnosis not present

## 2022-10-05 DIAGNOSIS — M797 Fibromyalgia: Secondary | ICD-10-CM | POA: Diagnosis not present

## 2022-10-05 DIAGNOSIS — J9611 Chronic respiratory failure with hypoxia: Secondary | ICD-10-CM | POA: Diagnosis not present

## 2022-10-05 DIAGNOSIS — I482 Chronic atrial fibrillation, unspecified: Secondary | ICD-10-CM | POA: Diagnosis not present

## 2022-10-05 DIAGNOSIS — G894 Chronic pain syndrome: Secondary | ICD-10-CM | POA: Diagnosis not present

## 2022-10-05 DIAGNOSIS — M1990 Unspecified osteoarthritis, unspecified site: Secondary | ICD-10-CM | POA: Diagnosis not present

## 2022-10-05 DIAGNOSIS — H8110 Benign paroxysmal vertigo, unspecified ear: Secondary | ICD-10-CM | POA: Diagnosis not present

## 2022-10-05 DIAGNOSIS — I110 Hypertensive heart disease with heart failure: Secondary | ICD-10-CM | POA: Diagnosis not present

## 2022-10-05 DIAGNOSIS — E039 Hypothyroidism, unspecified: Secondary | ICD-10-CM | POA: Diagnosis not present

## 2022-10-05 DIAGNOSIS — Z9981 Dependence on supplemental oxygen: Secondary | ICD-10-CM | POA: Diagnosis not present

## 2022-10-05 DIAGNOSIS — J431 Panlobular emphysema: Secondary | ICD-10-CM | POA: Diagnosis not present

## 2022-10-05 DIAGNOSIS — Z7901 Long term (current) use of anticoagulants: Secondary | ICD-10-CM | POA: Diagnosis not present

## 2022-10-05 DIAGNOSIS — K828 Other specified diseases of gallbladder: Secondary | ICD-10-CM | POA: Diagnosis not present

## 2022-10-05 DIAGNOSIS — I70213 Atherosclerosis of native arteries of extremities with intermittent claudication, bilateral legs: Secondary | ICD-10-CM | POA: Diagnosis not present

## 2022-10-05 DIAGNOSIS — K449 Diaphragmatic hernia without obstruction or gangrene: Secondary | ICD-10-CM | POA: Diagnosis not present

## 2022-10-05 DIAGNOSIS — F0283 Dementia in other diseases classified elsewhere, unspecified severity, with mood disturbance: Secondary | ICD-10-CM | POA: Diagnosis not present

## 2022-10-05 DIAGNOSIS — K219 Gastro-esophageal reflux disease without esophagitis: Secondary | ICD-10-CM | POA: Diagnosis not present

## 2022-10-05 DIAGNOSIS — F17210 Nicotine dependence, cigarettes, uncomplicated: Secondary | ICD-10-CM | POA: Diagnosis not present

## 2022-10-05 DIAGNOSIS — D649 Anemia, unspecified: Secondary | ICD-10-CM | POA: Diagnosis not present

## 2022-10-05 DIAGNOSIS — F0284 Dementia in other diseases classified elsewhere, unspecified severity, with anxiety: Secondary | ICD-10-CM | POA: Diagnosis not present

## 2022-10-06 DIAGNOSIS — F0283 Dementia in other diseases classified elsewhere, unspecified severity, with mood disturbance: Secondary | ICD-10-CM | POA: Diagnosis not present

## 2022-10-06 DIAGNOSIS — M1990 Unspecified osteoarthritis, unspecified site: Secondary | ICD-10-CM | POA: Diagnosis not present

## 2022-10-06 DIAGNOSIS — I70213 Atherosclerosis of native arteries of extremities with intermittent claudication, bilateral legs: Secondary | ICD-10-CM | POA: Diagnosis not present

## 2022-10-06 DIAGNOSIS — R262 Difficulty in walking, not elsewhere classified: Secondary | ICD-10-CM | POA: Diagnosis not present

## 2022-10-06 DIAGNOSIS — J9611 Chronic respiratory failure with hypoxia: Secondary | ICD-10-CM

## 2022-10-06 DIAGNOSIS — J431 Panlobular emphysema: Secondary | ICD-10-CM | POA: Diagnosis not present

## 2022-10-06 DIAGNOSIS — E782 Mixed hyperlipidemia: Secondary | ICD-10-CM | POA: Diagnosis not present

## 2022-10-06 DIAGNOSIS — D649 Anemia, unspecified: Secondary | ICD-10-CM | POA: Diagnosis not present

## 2022-10-06 DIAGNOSIS — K449 Diaphragmatic hernia without obstruction or gangrene: Secondary | ICD-10-CM | POA: Diagnosis not present

## 2022-10-06 DIAGNOSIS — I110 Hypertensive heart disease with heart failure: Secondary | ICD-10-CM | POA: Diagnosis not present

## 2022-10-06 DIAGNOSIS — I5032 Chronic diastolic (congestive) heart failure: Secondary | ICD-10-CM

## 2022-10-06 DIAGNOSIS — J9612 Chronic respiratory failure with hypercapnia: Secondary | ICD-10-CM | POA: Diagnosis not present

## 2022-10-06 DIAGNOSIS — Z9981 Dependence on supplemental oxygen: Secondary | ICD-10-CM | POA: Diagnosis not present

## 2022-10-06 DIAGNOSIS — F419 Anxiety disorder, unspecified: Secondary | ICD-10-CM | POA: Diagnosis not present

## 2022-10-06 DIAGNOSIS — F0284 Dementia in other diseases classified elsewhere, unspecified severity, with anxiety: Secondary | ICD-10-CM | POA: Diagnosis not present

## 2022-10-06 DIAGNOSIS — G894 Chronic pain syndrome: Secondary | ICD-10-CM | POA: Diagnosis not present

## 2022-10-06 DIAGNOSIS — M797 Fibromyalgia: Secondary | ICD-10-CM | POA: Diagnosis not present

## 2022-10-06 DIAGNOSIS — K828 Other specified diseases of gallbladder: Secondary | ICD-10-CM | POA: Diagnosis not present

## 2022-10-06 DIAGNOSIS — I480 Paroxysmal atrial fibrillation: Secondary | ICD-10-CM

## 2022-10-06 DIAGNOSIS — M545 Low back pain, unspecified: Secondary | ICD-10-CM | POA: Diagnosis not present

## 2022-10-06 DIAGNOSIS — E039 Hypothyroidism, unspecified: Secondary | ICD-10-CM | POA: Diagnosis not present

## 2022-10-06 DIAGNOSIS — K22719 Barrett's esophagus with dysplasia, unspecified: Secondary | ICD-10-CM | POA: Diagnosis not present

## 2022-10-06 DIAGNOSIS — H8110 Benign paroxysmal vertigo, unspecified ear: Secondary | ICD-10-CM | POA: Diagnosis not present

## 2022-10-06 DIAGNOSIS — Z7901 Long term (current) use of anticoagulants: Secondary | ICD-10-CM | POA: Diagnosis not present

## 2022-10-06 DIAGNOSIS — K219 Gastro-esophageal reflux disease without esophagitis: Secondary | ICD-10-CM | POA: Diagnosis not present

## 2022-10-06 DIAGNOSIS — I482 Chronic atrial fibrillation, unspecified: Secondary | ICD-10-CM | POA: Diagnosis not present

## 2022-10-06 DIAGNOSIS — F17210 Nicotine dependence, cigarettes, uncomplicated: Secondary | ICD-10-CM | POA: Diagnosis not present

## 2022-10-07 ENCOUNTER — Other Ambulatory Visit: Payer: Self-pay

## 2022-10-07 DIAGNOSIS — J9621 Acute and chronic respiratory failure with hypoxia: Secondary | ICD-10-CM | POA: Diagnosis not present

## 2022-10-07 DIAGNOSIS — I5032 Chronic diastolic (congestive) heart failure: Secondary | ICD-10-CM | POA: Diagnosis not present

## 2022-10-07 MED ORDER — ONDANSETRON HCL 4 MG PO TABS: 4.0000 mg | ORAL_TABLET | Freq: Four times a day (QID) | ORAL | 1 refills | Status: DC

## 2022-10-08 DIAGNOSIS — J431 Panlobular emphysema: Secondary | ICD-10-CM | POA: Diagnosis not present

## 2022-10-08 DIAGNOSIS — M545 Low back pain, unspecified: Secondary | ICD-10-CM | POA: Diagnosis not present

## 2022-10-08 DIAGNOSIS — K219 Gastro-esophageal reflux disease without esophagitis: Secondary | ICD-10-CM | POA: Diagnosis not present

## 2022-10-08 DIAGNOSIS — H8110 Benign paroxysmal vertigo, unspecified ear: Secondary | ICD-10-CM | POA: Diagnosis not present

## 2022-10-08 DIAGNOSIS — J9611 Chronic respiratory failure with hypoxia: Secondary | ICD-10-CM | POA: Diagnosis not present

## 2022-10-08 DIAGNOSIS — I110 Hypertensive heart disease with heart failure: Secondary | ICD-10-CM | POA: Diagnosis not present

## 2022-10-08 DIAGNOSIS — I70213 Atherosclerosis of native arteries of extremities with intermittent claudication, bilateral legs: Secondary | ICD-10-CM | POA: Diagnosis not present

## 2022-10-08 DIAGNOSIS — F0283 Dementia in other diseases classified elsewhere, unspecified severity, with mood disturbance: Secondary | ICD-10-CM | POA: Diagnosis not present

## 2022-10-08 DIAGNOSIS — G894 Chronic pain syndrome: Secondary | ICD-10-CM | POA: Diagnosis not present

## 2022-10-08 DIAGNOSIS — K828 Other specified diseases of gallbladder: Secondary | ICD-10-CM | POA: Diagnosis not present

## 2022-10-08 DIAGNOSIS — Z9981 Dependence on supplemental oxygen: Secondary | ICD-10-CM | POA: Diagnosis not present

## 2022-10-08 DIAGNOSIS — E039 Hypothyroidism, unspecified: Secondary | ICD-10-CM | POA: Diagnosis not present

## 2022-10-08 DIAGNOSIS — K22719 Barrett's esophagus with dysplasia, unspecified: Secondary | ICD-10-CM | POA: Diagnosis not present

## 2022-10-08 DIAGNOSIS — I482 Chronic atrial fibrillation, unspecified: Secondary | ICD-10-CM | POA: Diagnosis not present

## 2022-10-08 DIAGNOSIS — E782 Mixed hyperlipidemia: Secondary | ICD-10-CM | POA: Diagnosis not present

## 2022-10-08 DIAGNOSIS — F0284 Dementia in other diseases classified elsewhere, unspecified severity, with anxiety: Secondary | ICD-10-CM | POA: Diagnosis not present

## 2022-10-08 DIAGNOSIS — F17210 Nicotine dependence, cigarettes, uncomplicated: Secondary | ICD-10-CM | POA: Diagnosis not present

## 2022-10-08 DIAGNOSIS — M1990 Unspecified osteoarthritis, unspecified site: Secondary | ICD-10-CM | POA: Diagnosis not present

## 2022-10-08 DIAGNOSIS — M797 Fibromyalgia: Secondary | ICD-10-CM | POA: Diagnosis not present

## 2022-10-08 DIAGNOSIS — K449 Diaphragmatic hernia without obstruction or gangrene: Secondary | ICD-10-CM | POA: Diagnosis not present

## 2022-10-08 DIAGNOSIS — I5032 Chronic diastolic (congestive) heart failure: Secondary | ICD-10-CM | POA: Diagnosis not present

## 2022-10-08 DIAGNOSIS — D649 Anemia, unspecified: Secondary | ICD-10-CM | POA: Diagnosis not present

## 2022-10-08 DIAGNOSIS — J9612 Chronic respiratory failure with hypercapnia: Secondary | ICD-10-CM | POA: Diagnosis not present

## 2022-10-08 DIAGNOSIS — Z7901 Long term (current) use of anticoagulants: Secondary | ICD-10-CM | POA: Diagnosis not present

## 2022-10-09 DIAGNOSIS — I4891 Unspecified atrial fibrillation: Secondary | ICD-10-CM | POA: Diagnosis not present

## 2022-10-09 DIAGNOSIS — R0602 Shortness of breath: Secondary | ICD-10-CM | POA: Diagnosis not present

## 2022-10-09 DIAGNOSIS — J449 Chronic obstructive pulmonary disease, unspecified: Secondary | ICD-10-CM | POA: Diagnosis not present

## 2022-10-09 DIAGNOSIS — R000 Tachycardia, unspecified: Secondary | ICD-10-CM | POA: Diagnosis not present

## 2022-10-09 DIAGNOSIS — Z20822 Contact with and (suspected) exposure to covid-19: Secondary | ICD-10-CM | POA: Diagnosis not present

## 2022-10-09 DIAGNOSIS — I110 Hypertensive heart disease with heart failure: Secondary | ICD-10-CM | POA: Diagnosis not present

## 2022-10-09 DIAGNOSIS — R9431 Abnormal electrocardiogram [ECG] [EKG]: Secondary | ICD-10-CM | POA: Diagnosis not present

## 2022-10-09 DIAGNOSIS — R059 Cough, unspecified: Secondary | ICD-10-CM | POA: Diagnosis not present

## 2022-10-09 DIAGNOSIS — I959 Hypotension, unspecified: Secondary | ICD-10-CM | POA: Diagnosis not present

## 2022-10-09 DIAGNOSIS — Z743 Need for continuous supervision: Secondary | ICD-10-CM | POA: Diagnosis not present

## 2022-10-09 DIAGNOSIS — J90 Pleural effusion, not elsewhere classified: Secondary | ICD-10-CM | POA: Diagnosis not present

## 2022-10-09 DIAGNOSIS — R109 Unspecified abdominal pain: Secondary | ICD-10-CM | POA: Diagnosis not present

## 2022-10-09 DIAGNOSIS — I5032 Chronic diastolic (congestive) heart failure: Secondary | ICD-10-CM | POA: Diagnosis not present

## 2022-10-09 DIAGNOSIS — R079 Chest pain, unspecified: Secondary | ICD-10-CM | POA: Diagnosis not present

## 2022-10-09 DIAGNOSIS — K6389 Other specified diseases of intestine: Secondary | ICD-10-CM | POA: Diagnosis not present

## 2022-10-09 DIAGNOSIS — E119 Type 2 diabetes mellitus without complications: Secondary | ICD-10-CM | POA: Diagnosis not present

## 2022-10-09 DIAGNOSIS — J984 Other disorders of lung: Secondary | ICD-10-CM | POA: Diagnosis not present

## 2022-10-09 DIAGNOSIS — G894 Chronic pain syndrome: Secondary | ICD-10-CM | POA: Diagnosis not present

## 2022-10-09 DIAGNOSIS — Z8673 Personal history of transient ischemic attack (TIA), and cerebral infarction without residual deficits: Secondary | ICD-10-CM | POA: Diagnosis not present

## 2022-10-09 DIAGNOSIS — I10 Essential (primary) hypertension: Secondary | ICD-10-CM | POA: Diagnosis not present

## 2022-10-09 DIAGNOSIS — I510 Cardiac septal defect, acquired: Secondary | ICD-10-CM | POA: Diagnosis not present

## 2022-10-12 DIAGNOSIS — J449 Chronic obstructive pulmonary disease, unspecified: Secondary | ICD-10-CM | POA: Diagnosis not present

## 2022-10-12 DIAGNOSIS — J9621 Acute and chronic respiratory failure with hypoxia: Secondary | ICD-10-CM | POA: Diagnosis not present

## 2022-10-13 DIAGNOSIS — J9611 Chronic respiratory failure with hypoxia: Secondary | ICD-10-CM | POA: Diagnosis not present

## 2022-10-13 DIAGNOSIS — F0284 Dementia in other diseases classified elsewhere, unspecified severity, with anxiety: Secondary | ICD-10-CM | POA: Diagnosis not present

## 2022-10-13 DIAGNOSIS — M545 Low back pain, unspecified: Secondary | ICD-10-CM | POA: Diagnosis not present

## 2022-10-13 DIAGNOSIS — I110 Hypertensive heart disease with heart failure: Secondary | ICD-10-CM | POA: Diagnosis not present

## 2022-10-13 DIAGNOSIS — D649 Anemia, unspecified: Secondary | ICD-10-CM | POA: Diagnosis not present

## 2022-10-13 DIAGNOSIS — J431 Panlobular emphysema: Secondary | ICD-10-CM | POA: Diagnosis not present

## 2022-10-13 DIAGNOSIS — G894 Chronic pain syndrome: Secondary | ICD-10-CM | POA: Diagnosis not present

## 2022-10-13 DIAGNOSIS — J9612 Chronic respiratory failure with hypercapnia: Secondary | ICD-10-CM | POA: Diagnosis not present

## 2022-10-13 DIAGNOSIS — K449 Diaphragmatic hernia without obstruction or gangrene: Secondary | ICD-10-CM | POA: Diagnosis not present

## 2022-10-13 DIAGNOSIS — I70213 Atherosclerosis of native arteries of extremities with intermittent claudication, bilateral legs: Secondary | ICD-10-CM | POA: Diagnosis not present

## 2022-10-13 DIAGNOSIS — K22719 Barrett's esophagus with dysplasia, unspecified: Secondary | ICD-10-CM | POA: Diagnosis not present

## 2022-10-13 DIAGNOSIS — F17210 Nicotine dependence, cigarettes, uncomplicated: Secondary | ICD-10-CM | POA: Diagnosis not present

## 2022-10-13 DIAGNOSIS — E782 Mixed hyperlipidemia: Secondary | ICD-10-CM | POA: Diagnosis not present

## 2022-10-13 DIAGNOSIS — K828 Other specified diseases of gallbladder: Secondary | ICD-10-CM | POA: Diagnosis not present

## 2022-10-13 DIAGNOSIS — I5032 Chronic diastolic (congestive) heart failure: Secondary | ICD-10-CM | POA: Diagnosis not present

## 2022-10-13 DIAGNOSIS — E039 Hypothyroidism, unspecified: Secondary | ICD-10-CM | POA: Diagnosis not present

## 2022-10-13 DIAGNOSIS — F0283 Dementia in other diseases classified elsewhere, unspecified severity, with mood disturbance: Secondary | ICD-10-CM | POA: Diagnosis not present

## 2022-10-13 DIAGNOSIS — M1990 Unspecified osteoarthritis, unspecified site: Secondary | ICD-10-CM | POA: Diagnosis not present

## 2022-10-13 DIAGNOSIS — K219 Gastro-esophageal reflux disease without esophagitis: Secondary | ICD-10-CM | POA: Diagnosis not present

## 2022-10-13 DIAGNOSIS — I482 Chronic atrial fibrillation, unspecified: Secondary | ICD-10-CM | POA: Diagnosis not present

## 2022-10-13 DIAGNOSIS — Z7901 Long term (current) use of anticoagulants: Secondary | ICD-10-CM | POA: Diagnosis not present

## 2022-10-13 DIAGNOSIS — H8110 Benign paroxysmal vertigo, unspecified ear: Secondary | ICD-10-CM | POA: Diagnosis not present

## 2022-10-13 DIAGNOSIS — Z9981 Dependence on supplemental oxygen: Secondary | ICD-10-CM | POA: Diagnosis not present

## 2022-10-13 DIAGNOSIS — M797 Fibromyalgia: Secondary | ICD-10-CM | POA: Diagnosis not present

## 2022-10-14 DIAGNOSIS — F17210 Nicotine dependence, cigarettes, uncomplicated: Secondary | ICD-10-CM | POA: Diagnosis not present

## 2022-10-14 DIAGNOSIS — J9612 Chronic respiratory failure with hypercapnia: Secondary | ICD-10-CM | POA: Diagnosis not present

## 2022-10-14 DIAGNOSIS — K22719 Barrett's esophagus with dysplasia, unspecified: Secondary | ICD-10-CM | POA: Diagnosis not present

## 2022-10-14 DIAGNOSIS — G894 Chronic pain syndrome: Secondary | ICD-10-CM | POA: Diagnosis not present

## 2022-10-14 DIAGNOSIS — Z9981 Dependence on supplemental oxygen: Secondary | ICD-10-CM | POA: Diagnosis not present

## 2022-10-14 DIAGNOSIS — D649 Anemia, unspecified: Secondary | ICD-10-CM | POA: Diagnosis not present

## 2022-10-14 DIAGNOSIS — J9611 Chronic respiratory failure with hypoxia: Secondary | ICD-10-CM | POA: Diagnosis not present

## 2022-10-14 DIAGNOSIS — M797 Fibromyalgia: Secondary | ICD-10-CM | POA: Diagnosis not present

## 2022-10-14 DIAGNOSIS — F0283 Dementia in other diseases classified elsewhere, unspecified severity, with mood disturbance: Secondary | ICD-10-CM | POA: Diagnosis not present

## 2022-10-14 DIAGNOSIS — H8110 Benign paroxysmal vertigo, unspecified ear: Secondary | ICD-10-CM | POA: Diagnosis not present

## 2022-10-14 DIAGNOSIS — J431 Panlobular emphysema: Secondary | ICD-10-CM | POA: Diagnosis not present

## 2022-10-14 DIAGNOSIS — I482 Chronic atrial fibrillation, unspecified: Secondary | ICD-10-CM | POA: Diagnosis not present

## 2022-10-14 DIAGNOSIS — M545 Low back pain, unspecified: Secondary | ICD-10-CM | POA: Diagnosis not present

## 2022-10-14 DIAGNOSIS — F0284 Dementia in other diseases classified elsewhere, unspecified severity, with anxiety: Secondary | ICD-10-CM | POA: Diagnosis not present

## 2022-10-14 DIAGNOSIS — K219 Gastro-esophageal reflux disease without esophagitis: Secondary | ICD-10-CM | POA: Diagnosis not present

## 2022-10-14 DIAGNOSIS — E782 Mixed hyperlipidemia: Secondary | ICD-10-CM | POA: Diagnosis not present

## 2022-10-14 DIAGNOSIS — I110 Hypertensive heart disease with heart failure: Secondary | ICD-10-CM | POA: Diagnosis not present

## 2022-10-14 DIAGNOSIS — E039 Hypothyroidism, unspecified: Secondary | ICD-10-CM | POA: Diagnosis not present

## 2022-10-14 DIAGNOSIS — K449 Diaphragmatic hernia without obstruction or gangrene: Secondary | ICD-10-CM | POA: Diagnosis not present

## 2022-10-14 DIAGNOSIS — K828 Other specified diseases of gallbladder: Secondary | ICD-10-CM | POA: Diagnosis not present

## 2022-10-14 DIAGNOSIS — M1990 Unspecified osteoarthritis, unspecified site: Secondary | ICD-10-CM | POA: Diagnosis not present

## 2022-10-14 DIAGNOSIS — I70213 Atherosclerosis of native arteries of extremities with intermittent claudication, bilateral legs: Secondary | ICD-10-CM | POA: Diagnosis not present

## 2022-10-14 DIAGNOSIS — I5032 Chronic diastolic (congestive) heart failure: Secondary | ICD-10-CM | POA: Diagnosis not present

## 2022-10-14 DIAGNOSIS — Z7901 Long term (current) use of anticoagulants: Secondary | ICD-10-CM | POA: Diagnosis not present

## 2022-10-15 ENCOUNTER — Telehealth: Payer: Self-pay

## 2022-10-15 NOTE — Telephone Encounter (Signed)
Transition Care Management Follow-up Telephone Call Date of discharge and from where: 10/10/2022 [facility] How have you been since you were released from the hospital? Per patient's daughter she is feeling much better. Any questions or concerns? No  Items Reviewed: Did the pt receive and understand the discharge instructions provided? Yes  Medications obtained and verified?  No medication prescribed. Other? No  Any new allergies since your discharge? No  Dietary orders reviewed? Yes Do you have support at home? Yes   Follow up appointments reviewed:  PCP Hospital f/u appt confirmed? No  Scheduled to see  on  @ . Specialist Hospital f/u appt confirmed? No  Scheduled to see  on  @ . Are transportation arrangements needed? No  If their condition worsens, is the pt aware to call PCP or go to the Emergency Dept.? Yes Was the patient provided with contact information for the PCP's office or ED? Yes Was to pt encouraged to call back with questions or concerns? Yes  Jarl Sellitto [facility]  [facility] Population Health Community Resource Care Guide   [email or web address]  ?? [phone]   Website: [email or web address]

## 2022-10-19 ENCOUNTER — Other Ambulatory Visit: Payer: Self-pay | Admitting: Internal Medicine

## 2022-10-19 MED ORDER — LORAZEPAM 0.5 MG PO TABS: 0.5000 mg | ORAL_TABLET | Freq: Every day | ORAL | 1 refills | Status: DC | PRN

## 2022-10-20 ENCOUNTER — Other Ambulatory Visit: Payer: Self-pay

## 2022-10-20 DIAGNOSIS — J9612 Chronic respiratory failure with hypercapnia: Secondary | ICD-10-CM | POA: Diagnosis not present

## 2022-10-20 DIAGNOSIS — F0284 Dementia in other diseases classified elsewhere, unspecified severity, with anxiety: Secondary | ICD-10-CM | POA: Diagnosis not present

## 2022-10-20 DIAGNOSIS — K449 Diaphragmatic hernia without obstruction or gangrene: Secondary | ICD-10-CM | POA: Diagnosis not present

## 2022-10-20 DIAGNOSIS — J431 Panlobular emphysema: Secondary | ICD-10-CM | POA: Diagnosis not present

## 2022-10-20 DIAGNOSIS — I482 Chronic atrial fibrillation, unspecified: Secondary | ICD-10-CM | POA: Diagnosis not present

## 2022-10-20 DIAGNOSIS — G894 Chronic pain syndrome: Secondary | ICD-10-CM | POA: Diagnosis not present

## 2022-10-20 DIAGNOSIS — I70213 Atherosclerosis of native arteries of extremities with intermittent claudication, bilateral legs: Secondary | ICD-10-CM | POA: Diagnosis not present

## 2022-10-20 DIAGNOSIS — I110 Hypertensive heart disease with heart failure: Secondary | ICD-10-CM | POA: Diagnosis not present

## 2022-10-20 DIAGNOSIS — Z7901 Long term (current) use of anticoagulants: Secondary | ICD-10-CM | POA: Diagnosis not present

## 2022-10-20 DIAGNOSIS — J9611 Chronic respiratory failure with hypoxia: Secondary | ICD-10-CM | POA: Diagnosis not present

## 2022-10-20 DIAGNOSIS — F0283 Dementia in other diseases classified elsewhere, unspecified severity, with mood disturbance: Secondary | ICD-10-CM | POA: Diagnosis not present

## 2022-10-20 DIAGNOSIS — F17210 Nicotine dependence, cigarettes, uncomplicated: Secondary | ICD-10-CM | POA: Diagnosis not present

## 2022-10-20 DIAGNOSIS — H8110 Benign paroxysmal vertigo, unspecified ear: Secondary | ICD-10-CM | POA: Diagnosis not present

## 2022-10-20 DIAGNOSIS — E782 Mixed hyperlipidemia: Secondary | ICD-10-CM | POA: Diagnosis not present

## 2022-10-20 DIAGNOSIS — M545 Low back pain, unspecified: Secondary | ICD-10-CM | POA: Diagnosis not present

## 2022-10-20 DIAGNOSIS — K219 Gastro-esophageal reflux disease without esophagitis: Secondary | ICD-10-CM | POA: Diagnosis not present

## 2022-10-20 DIAGNOSIS — I5032 Chronic diastolic (congestive) heart failure: Secondary | ICD-10-CM | POA: Diagnosis not present

## 2022-10-20 DIAGNOSIS — D649 Anemia, unspecified: Secondary | ICD-10-CM | POA: Diagnosis not present

## 2022-10-20 DIAGNOSIS — M1990 Unspecified osteoarthritis, unspecified site: Secondary | ICD-10-CM | POA: Diagnosis not present

## 2022-10-20 DIAGNOSIS — M797 Fibromyalgia: Secondary | ICD-10-CM | POA: Diagnosis not present

## 2022-10-20 DIAGNOSIS — K828 Other specified diseases of gallbladder: Secondary | ICD-10-CM | POA: Diagnosis not present

## 2022-10-20 DIAGNOSIS — Z9981 Dependence on supplemental oxygen: Secondary | ICD-10-CM | POA: Diagnosis not present

## 2022-10-20 DIAGNOSIS — K22719 Barrett's esophagus with dysplasia, unspecified: Secondary | ICD-10-CM | POA: Diagnosis not present

## 2022-10-20 DIAGNOSIS — E039 Hypothyroidism, unspecified: Secondary | ICD-10-CM | POA: Diagnosis not present

## 2022-10-20 MED ORDER — BUMETANIDE 2 MG PO TABS: 4.0000 mg | ORAL_TABLET | Freq: Two times a day (BID) | ORAL | 1 refills | Status: DC

## 2022-10-21 ENCOUNTER — Other Ambulatory Visit: Payer: Self-pay | Admitting: Internal Medicine

## 2022-10-21 DIAGNOSIS — M1990 Unspecified osteoarthritis, unspecified site: Secondary | ICD-10-CM | POA: Diagnosis not present

## 2022-10-21 DIAGNOSIS — K219 Gastro-esophageal reflux disease without esophagitis: Secondary | ICD-10-CM | POA: Diagnosis not present

## 2022-10-21 DIAGNOSIS — F17210 Nicotine dependence, cigarettes, uncomplicated: Secondary | ICD-10-CM | POA: Diagnosis not present

## 2022-10-21 DIAGNOSIS — Z7901 Long term (current) use of anticoagulants: Secondary | ICD-10-CM | POA: Diagnosis not present

## 2022-10-21 DIAGNOSIS — M545 Low back pain, unspecified: Secondary | ICD-10-CM | POA: Diagnosis not present

## 2022-10-21 DIAGNOSIS — K22719 Barrett's esophagus with dysplasia, unspecified: Secondary | ICD-10-CM | POA: Diagnosis not present

## 2022-10-21 DIAGNOSIS — F0284 Dementia in other diseases classified elsewhere, unspecified severity, with anxiety: Secondary | ICD-10-CM | POA: Diagnosis not present

## 2022-10-21 DIAGNOSIS — F0283 Dementia in other diseases classified elsewhere, unspecified severity, with mood disturbance: Secondary | ICD-10-CM | POA: Diagnosis not present

## 2022-10-21 DIAGNOSIS — J9612 Chronic respiratory failure with hypercapnia: Secondary | ICD-10-CM | POA: Diagnosis not present

## 2022-10-21 DIAGNOSIS — J9611 Chronic respiratory failure with hypoxia: Secondary | ICD-10-CM | POA: Diagnosis not present

## 2022-10-21 DIAGNOSIS — I110 Hypertensive heart disease with heart failure: Secondary | ICD-10-CM | POA: Diagnosis not present

## 2022-10-21 DIAGNOSIS — I5032 Chronic diastolic (congestive) heart failure: Secondary | ICD-10-CM | POA: Diagnosis not present

## 2022-10-21 DIAGNOSIS — I70213 Atherosclerosis of native arteries of extremities with intermittent claudication, bilateral legs: Secondary | ICD-10-CM | POA: Diagnosis not present

## 2022-10-21 DIAGNOSIS — E039 Hypothyroidism, unspecified: Secondary | ICD-10-CM | POA: Diagnosis not present

## 2022-10-21 DIAGNOSIS — K828 Other specified diseases of gallbladder: Secondary | ICD-10-CM | POA: Diagnosis not present

## 2022-10-21 DIAGNOSIS — E782 Mixed hyperlipidemia: Secondary | ICD-10-CM | POA: Diagnosis not present

## 2022-10-21 DIAGNOSIS — H8110 Benign paroxysmal vertigo, unspecified ear: Secondary | ICD-10-CM | POA: Diagnosis not present

## 2022-10-21 DIAGNOSIS — M797 Fibromyalgia: Secondary | ICD-10-CM | POA: Diagnosis not present

## 2022-10-21 DIAGNOSIS — Z9981 Dependence on supplemental oxygen: Secondary | ICD-10-CM | POA: Diagnosis not present

## 2022-10-21 DIAGNOSIS — J431 Panlobular emphysema: Secondary | ICD-10-CM | POA: Diagnosis not present

## 2022-10-21 DIAGNOSIS — G894 Chronic pain syndrome: Secondary | ICD-10-CM | POA: Diagnosis not present

## 2022-10-21 DIAGNOSIS — K449 Diaphragmatic hernia without obstruction or gangrene: Secondary | ICD-10-CM | POA: Diagnosis not present

## 2022-10-21 DIAGNOSIS — D649 Anemia, unspecified: Secondary | ICD-10-CM | POA: Diagnosis not present

## 2022-10-21 DIAGNOSIS — I482 Chronic atrial fibrillation, unspecified: Secondary | ICD-10-CM | POA: Diagnosis not present

## 2022-10-26 ENCOUNTER — Other Ambulatory Visit: Payer: Self-pay | Admitting: Internal Medicine

## 2022-10-27 DIAGNOSIS — G629 Polyneuropathy, unspecified: Secondary | ICD-10-CM | POA: Diagnosis not present

## 2022-10-27 DIAGNOSIS — R0602 Shortness of breath: Secondary | ICD-10-CM | POA: Diagnosis not present

## 2022-10-27 DIAGNOSIS — E876 Hypokalemia: Secondary | ICD-10-CM | POA: Diagnosis not present

## 2022-10-27 DIAGNOSIS — R0789 Other chest pain: Secondary | ICD-10-CM | POA: Diagnosis not present

## 2022-10-27 DIAGNOSIS — E873 Alkalosis: Secondary | ICD-10-CM | POA: Diagnosis not present

## 2022-10-27 DIAGNOSIS — J99 Respiratory disorders in diseases classified elsewhere: Secondary | ICD-10-CM | POA: Diagnosis not present

## 2022-10-27 DIAGNOSIS — F17210 Nicotine dependence, cigarettes, uncomplicated: Secondary | ICD-10-CM | POA: Diagnosis not present

## 2022-10-27 DIAGNOSIS — R109 Unspecified abdominal pain: Secondary | ICD-10-CM | POA: Diagnosis not present

## 2022-10-27 DIAGNOSIS — M1990 Unspecified osteoarthritis, unspecified site: Secondary | ICD-10-CM | POA: Diagnosis not present

## 2022-10-27 DIAGNOSIS — I110 Hypertensive heart disease with heart failure: Secondary | ICD-10-CM | POA: Diagnosis not present

## 2022-10-27 DIAGNOSIS — R300 Dysuria: Secondary | ICD-10-CM | POA: Diagnosis not present

## 2022-10-27 DIAGNOSIS — J9610 Chronic respiratory failure, unspecified whether with hypoxia or hypercapnia: Secondary | ICD-10-CM | POA: Diagnosis not present

## 2022-10-27 DIAGNOSIS — J441 Chronic obstructive pulmonary disease with (acute) exacerbation: Secondary | ICD-10-CM | POA: Diagnosis not present

## 2022-10-27 DIAGNOSIS — R918 Other nonspecific abnormal finding of lung field: Secondary | ICD-10-CM | POA: Diagnosis not present

## 2022-10-27 DIAGNOSIS — J9621 Acute and chronic respiratory failure with hypoxia: Secondary | ICD-10-CM | POA: Diagnosis not present

## 2022-10-27 DIAGNOSIS — E871 Hypo-osmolality and hyponatremia: Secondary | ICD-10-CM | POA: Diagnosis not present

## 2022-10-27 DIAGNOSIS — R2689 Other abnormalities of gait and mobility: Secondary | ICD-10-CM | POA: Diagnosis not present

## 2022-10-27 DIAGNOSIS — R1013 Epigastric pain: Secondary | ICD-10-CM | POA: Diagnosis not present

## 2022-10-27 DIAGNOSIS — T380X5A Adverse effect of glucocorticoids and synthetic analogues, initial encounter: Secondary | ICD-10-CM | POA: Diagnosis not present

## 2022-10-27 DIAGNOSIS — R739 Hyperglycemia, unspecified: Secondary | ICD-10-CM | POA: Diagnosis not present

## 2022-10-27 DIAGNOSIS — G8929 Other chronic pain: Secondary | ICD-10-CM | POA: Diagnosis not present

## 2022-10-27 DIAGNOSIS — M5116 Intervertebral disc disorders with radiculopathy, lumbar region: Secondary | ICD-10-CM | POA: Diagnosis not present

## 2022-10-27 DIAGNOSIS — Z9981 Dependence on supplemental oxygen: Secondary | ICD-10-CM | POA: Diagnosis not present

## 2022-10-27 DIAGNOSIS — D649 Anemia, unspecified: Secondary | ICD-10-CM | POA: Diagnosis not present

## 2022-10-27 DIAGNOSIS — R040 Epistaxis: Secondary | ICD-10-CM | POA: Diagnosis not present

## 2022-10-27 DIAGNOSIS — R9431 Abnormal electrocardiogram [ECG] [EKG]: Secondary | ICD-10-CM | POA: Diagnosis not present

## 2022-10-27 DIAGNOSIS — E878 Other disorders of electrolyte and fluid balance, not elsewhere classified: Secondary | ICD-10-CM | POA: Diagnosis not present

## 2022-10-27 DIAGNOSIS — J9611 Chronic respiratory failure with hypoxia: Secondary | ICD-10-CM | POA: Diagnosis not present

## 2022-10-27 DIAGNOSIS — E039 Hypothyroidism, unspecified: Secondary | ICD-10-CM | POA: Diagnosis not present

## 2022-10-27 DIAGNOSIS — I959 Hypotension, unspecified: Secondary | ICD-10-CM | POA: Diagnosis not present

## 2022-10-27 DIAGNOSIS — Z7401 Bed confinement status: Secondary | ICD-10-CM | POA: Diagnosis not present

## 2022-10-27 DIAGNOSIS — M6259 Muscle wasting and atrophy, not elsewhere classified, multiple sites: Secondary | ICD-10-CM | POA: Diagnosis not present

## 2022-10-27 DIAGNOSIS — M549 Dorsalgia, unspecified: Secondary | ICD-10-CM | POA: Diagnosis not present

## 2022-10-27 DIAGNOSIS — I5032 Chronic diastolic (congestive) heart failure: Secondary | ICD-10-CM | POA: Diagnosis not present

## 2022-10-27 DIAGNOSIS — R278 Other lack of coordination: Secondary | ICD-10-CM | POA: Diagnosis not present

## 2022-10-27 DIAGNOSIS — N179 Acute kidney failure, unspecified: Secondary | ICD-10-CM | POA: Diagnosis not present

## 2022-10-27 DIAGNOSIS — Z8673 Personal history of transient ischemic attack (TIA), and cerebral infarction without residual deficits: Secondary | ICD-10-CM | POA: Diagnosis not present

## 2022-10-27 DIAGNOSIS — M6281 Muscle weakness (generalized): Secondary | ICD-10-CM | POA: Diagnosis not present

## 2022-10-27 DIAGNOSIS — J984 Other disorders of lung: Secondary | ICD-10-CM | POA: Diagnosis not present

## 2022-10-27 DIAGNOSIS — R069 Unspecified abnormalities of breathing: Secondary | ICD-10-CM | POA: Diagnosis not present

## 2022-10-27 DIAGNOSIS — Z95828 Presence of other vascular implants and grafts: Secondary | ICD-10-CM | POA: Diagnosis not present

## 2022-10-27 DIAGNOSIS — M545 Low back pain, unspecified: Secondary | ICD-10-CM | POA: Diagnosis not present

## 2022-10-27 DIAGNOSIS — G894 Chronic pain syndrome: Secondary | ICD-10-CM | POA: Diagnosis not present

## 2022-10-27 DIAGNOSIS — J449 Chronic obstructive pulmonary disease, unspecified: Secondary | ICD-10-CM | POA: Diagnosis not present

## 2022-10-27 DIAGNOSIS — F0393 Unspecified dementia, unspecified severity, with mood disturbance: Secondary | ICD-10-CM | POA: Diagnosis not present

## 2022-10-27 DIAGNOSIS — R531 Weakness: Secondary | ICD-10-CM | POA: Diagnosis not present

## 2022-10-27 DIAGNOSIS — I4891 Unspecified atrial fibrillation: Secondary | ICD-10-CM | POA: Diagnosis not present

## 2022-10-30 ENCOUNTER — Other Ambulatory Visit: Payer: Self-pay | Admitting: Internal Medicine

## 2022-10-30 DIAGNOSIS — R278 Other lack of coordination: Secondary | ICD-10-CM | POA: Diagnosis not present

## 2022-10-30 DIAGNOSIS — I959 Hypotension, unspecified: Secondary | ICD-10-CM | POA: Diagnosis not present

## 2022-10-30 DIAGNOSIS — I5032 Chronic diastolic (congestive) heart failure: Secondary | ICD-10-CM | POA: Diagnosis not present

## 2022-10-30 DIAGNOSIS — N179 Acute kidney failure, unspecified: Secondary | ICD-10-CM | POA: Diagnosis not present

## 2022-10-30 DIAGNOSIS — M6281 Muscle weakness (generalized): Secondary | ICD-10-CM | POA: Diagnosis not present

## 2022-10-30 DIAGNOSIS — J99 Respiratory disorders in diseases classified elsewhere: Secondary | ICD-10-CM | POA: Diagnosis not present

## 2022-10-30 DIAGNOSIS — E871 Hypo-osmolality and hyponatremia: Secondary | ICD-10-CM | POA: Diagnosis not present

## 2022-10-30 DIAGNOSIS — E039 Hypothyroidism, unspecified: Secondary | ICD-10-CM | POA: Diagnosis not present

## 2022-10-30 DIAGNOSIS — R040 Epistaxis: Secondary | ICD-10-CM | POA: Diagnosis not present

## 2022-10-30 DIAGNOSIS — J441 Chronic obstructive pulmonary disease with (acute) exacerbation: Secondary | ICD-10-CM | POA: Diagnosis not present

## 2022-10-30 DIAGNOSIS — M545 Low back pain, unspecified: Secondary | ICD-10-CM | POA: Diagnosis not present

## 2022-10-30 DIAGNOSIS — R531 Weakness: Secondary | ICD-10-CM | POA: Diagnosis not present

## 2022-10-30 DIAGNOSIS — Z7401 Bed confinement status: Secondary | ICD-10-CM | POA: Diagnosis not present

## 2022-10-30 DIAGNOSIS — R2689 Other abnormalities of gait and mobility: Secondary | ICD-10-CM | POA: Diagnosis not present

## 2022-10-30 DIAGNOSIS — J9610 Chronic respiratory failure, unspecified whether with hypoxia or hypercapnia: Secondary | ICD-10-CM | POA: Diagnosis not present

## 2022-10-30 DIAGNOSIS — J9621 Acute and chronic respiratory failure with hypoxia: Secondary | ICD-10-CM | POA: Diagnosis not present

## 2022-10-30 DIAGNOSIS — G8929 Other chronic pain: Secondary | ICD-10-CM | POA: Diagnosis not present

## 2022-10-30 DIAGNOSIS — I4891 Unspecified atrial fibrillation: Secondary | ICD-10-CM | POA: Diagnosis not present

## 2022-10-30 DIAGNOSIS — M6259 Muscle wasting and atrophy, not elsewhere classified, multiple sites: Secondary | ICD-10-CM | POA: Diagnosis not present

## 2022-10-30 DIAGNOSIS — J449 Chronic obstructive pulmonary disease, unspecified: Secondary | ICD-10-CM | POA: Diagnosis not present

## 2022-10-30 DIAGNOSIS — M5116 Intervertebral disc disorders with radiculopathy, lumbar region: Secondary | ICD-10-CM | POA: Diagnosis not present

## 2022-11-03 DIAGNOSIS — N179 Acute kidney failure, unspecified: Secondary | ICD-10-CM | POA: Diagnosis not present

## 2022-11-03 DIAGNOSIS — J9610 Chronic respiratory failure, unspecified whether with hypoxia or hypercapnia: Secondary | ICD-10-CM | POA: Diagnosis not present

## 2022-11-03 DIAGNOSIS — J449 Chronic obstructive pulmonary disease, unspecified: Secondary | ICD-10-CM | POA: Diagnosis not present

## 2022-11-03 DIAGNOSIS — E871 Hypo-osmolality and hyponatremia: Secondary | ICD-10-CM | POA: Diagnosis not present

## 2022-11-17 DIAGNOSIS — F17210 Nicotine dependence, cigarettes, uncomplicated: Secondary | ICD-10-CM | POA: Diagnosis not present

## 2022-11-17 DIAGNOSIS — J449 Chronic obstructive pulmonary disease, unspecified: Secondary | ICD-10-CM | POA: Diagnosis not present

## 2022-11-17 DIAGNOSIS — R059 Cough, unspecified: Secondary | ICD-10-CM | POA: Diagnosis not present

## 2022-11-17 DIAGNOSIS — R093 Abnormal sputum: Secondary | ICD-10-CM | POA: Diagnosis not present

## 2022-11-17 DIAGNOSIS — U071 COVID-19: Secondary | ICD-10-CM | POA: Diagnosis not present

## 2022-11-18 DIAGNOSIS — U071 COVID-19: Secondary | ICD-10-CM | POA: Diagnosis not present

## 2022-11-18 DIAGNOSIS — I5032 Chronic diastolic (congestive) heart failure: Secondary | ICD-10-CM | POA: Diagnosis not present

## 2022-11-18 DIAGNOSIS — E039 Hypothyroidism, unspecified: Secondary | ICD-10-CM | POA: Diagnosis not present

## 2022-11-23 DIAGNOSIS — R918 Other nonspecific abnormal finding of lung field: Secondary | ICD-10-CM | POA: Diagnosis not present

## 2022-11-25 DIAGNOSIS — J449 Chronic obstructive pulmonary disease, unspecified: Secondary | ICD-10-CM | POA: Diagnosis not present

## 2022-11-25 DIAGNOSIS — R040 Epistaxis: Secondary | ICD-10-CM | POA: Diagnosis not present

## 2022-11-30 DIAGNOSIS — M25531 Pain in right wrist: Secondary | ICD-10-CM | POA: Diagnosis not present

## 2022-12-01 DIAGNOSIS — M25531 Pain in right wrist: Secondary | ICD-10-CM | POA: Diagnosis not present

## 2022-12-03 DIAGNOSIS — N179 Acute kidney failure, unspecified: Secondary | ICD-10-CM | POA: Diagnosis not present

## 2022-12-04 DIAGNOSIS — N179 Acute kidney failure, unspecified: Secondary | ICD-10-CM | POA: Diagnosis not present

## 2022-12-04 DIAGNOSIS — R5383 Other fatigue: Secondary | ICD-10-CM | POA: Diagnosis not present

## 2022-12-07 ENCOUNTER — Ambulatory Visit: Payer: Medicare Other | Admitting: Internal Medicine

## 2022-12-07 DIAGNOSIS — I5032 Chronic diastolic (congestive) heart failure: Secondary | ICD-10-CM | POA: Diagnosis not present

## 2022-12-07 DIAGNOSIS — J449 Chronic obstructive pulmonary disease, unspecified: Secondary | ICD-10-CM | POA: Diagnosis not present

## 2022-12-07 DIAGNOSIS — J9610 Chronic respiratory failure, unspecified whether with hypoxia or hypercapnia: Secondary | ICD-10-CM | POA: Diagnosis not present

## 2022-12-07 DIAGNOSIS — F17210 Nicotine dependence, cigarettes, uncomplicated: Secondary | ICD-10-CM | POA: Diagnosis not present

## 2022-12-08 DIAGNOSIS — R0602 Shortness of breath: Secondary | ICD-10-CM | POA: Diagnosis not present

## 2022-12-08 DIAGNOSIS — I5032 Chronic diastolic (congestive) heart failure: Secondary | ICD-10-CM | POA: Diagnosis not present

## 2022-12-11 DIAGNOSIS — J9610 Chronic respiratory failure, unspecified whether with hypoxia or hypercapnia: Secondary | ICD-10-CM | POA: Diagnosis not present

## 2022-12-11 DIAGNOSIS — G4700 Insomnia, unspecified: Secondary | ICD-10-CM | POA: Diagnosis not present

## 2022-12-18 DIAGNOSIS — R918 Other nonspecific abnormal finding of lung field: Secondary | ICD-10-CM | POA: Diagnosis not present

## 2022-12-18 DIAGNOSIS — I5032 Chronic diastolic (congestive) heart failure: Secondary | ICD-10-CM | POA: Diagnosis not present

## 2022-12-21 DIAGNOSIS — D649 Anemia, unspecified: Secondary | ICD-10-CM | POA: Diagnosis not present

## 2022-12-21 DIAGNOSIS — G894 Chronic pain syndrome: Secondary | ICD-10-CM | POA: Diagnosis not present

## 2022-12-21 DIAGNOSIS — N19 Unspecified kidney failure: Secondary | ICD-10-CM | POA: Diagnosis not present

## 2022-12-22 DIAGNOSIS — J90 Pleural effusion, not elsewhere classified: Secondary | ICD-10-CM | POA: Diagnosis not present

## 2022-12-22 DIAGNOSIS — R059 Cough, unspecified: Secondary | ICD-10-CM | POA: Diagnosis not present

## 2022-12-22 DIAGNOSIS — Z7189 Other specified counseling: Secondary | ICD-10-CM | POA: Diagnosis not present

## 2022-12-24 DIAGNOSIS — R188 Other ascites: Secondary | ICD-10-CM | POA: Diagnosis not present

## 2022-12-24 DIAGNOSIS — Z7401 Bed confinement status: Secondary | ICD-10-CM | POA: Diagnosis not present

## 2022-12-24 DIAGNOSIS — I509 Heart failure, unspecified: Secondary | ICD-10-CM | POA: Diagnosis not present

## 2022-12-24 DIAGNOSIS — I2699 Other pulmonary embolism without acute cor pulmonale: Secondary | ICD-10-CM | POA: Diagnosis not present

## 2022-12-24 DIAGNOSIS — Z743 Need for continuous supervision: Secondary | ICD-10-CM | POA: Diagnosis not present

## 2022-12-24 DIAGNOSIS — J209 Acute bronchitis, unspecified: Secondary | ICD-10-CM | POA: Diagnosis not present

## 2022-12-24 DIAGNOSIS — J9620 Acute and chronic respiratory failure, unspecified whether with hypoxia or hypercapnia: Secondary | ICD-10-CM | POA: Diagnosis not present

## 2022-12-24 DIAGNOSIS — M1990 Unspecified osteoarthritis, unspecified site: Secondary | ICD-10-CM | POA: Diagnosis not present

## 2022-12-24 DIAGNOSIS — R590 Localized enlarged lymph nodes: Secondary | ICD-10-CM | POA: Diagnosis not present

## 2022-12-24 DIAGNOSIS — C3490 Malignant neoplasm of unspecified part of unspecified bronchus or lung: Secondary | ICD-10-CM | POA: Diagnosis not present

## 2022-12-24 DIAGNOSIS — Z95828 Presence of other vascular implants and grafts: Secondary | ICD-10-CM | POA: Diagnosis not present

## 2022-12-24 DIAGNOSIS — E039 Hypothyroidism, unspecified: Secondary | ICD-10-CM | POA: Diagnosis not present

## 2022-12-24 DIAGNOSIS — Z7901 Long term (current) use of anticoagulants: Secondary | ICD-10-CM | POA: Diagnosis not present

## 2022-12-24 DIAGNOSIS — E785 Hyperlipidemia, unspecified: Secondary | ICD-10-CM | POA: Diagnosis not present

## 2022-12-24 DIAGNOSIS — J90 Pleural effusion, not elsewhere classified: Secondary | ICD-10-CM | POA: Diagnosis not present

## 2022-12-24 DIAGNOSIS — G894 Chronic pain syndrome: Secondary | ICD-10-CM | POA: Diagnosis not present

## 2022-12-24 DIAGNOSIS — Z87440 Personal history of urinary (tract) infections: Secondary | ICD-10-CM | POA: Diagnosis not present

## 2022-12-24 DIAGNOSIS — D649 Anemia, unspecified: Secondary | ICD-10-CM | POA: Diagnosis not present

## 2022-12-24 DIAGNOSIS — Z720 Tobacco use: Secondary | ICD-10-CM | POA: Diagnosis not present

## 2022-12-24 DIAGNOSIS — M109 Gout, unspecified: Secondary | ICD-10-CM | POA: Diagnosis not present

## 2022-12-24 DIAGNOSIS — I351 Nonrheumatic aortic (valve) insufficiency: Secondary | ICD-10-CM | POA: Diagnosis not present

## 2022-12-24 DIAGNOSIS — I5033 Acute on chronic diastolic (congestive) heart failure: Secondary | ICD-10-CM | POA: Diagnosis not present

## 2022-12-24 DIAGNOSIS — R531 Weakness: Secondary | ICD-10-CM | POA: Diagnosis not present

## 2022-12-24 DIAGNOSIS — Z902 Acquired absence of lung [part of]: Secondary | ICD-10-CM | POA: Diagnosis not present

## 2022-12-24 DIAGNOSIS — I4891 Unspecified atrial fibrillation: Secondary | ICD-10-CM | POA: Diagnosis not present

## 2022-12-24 DIAGNOSIS — Z9981 Dependence on supplemental oxygen: Secondary | ICD-10-CM | POA: Diagnosis not present

## 2022-12-24 DIAGNOSIS — R000 Tachycardia, unspecified: Secondary | ICD-10-CM | POA: Diagnosis not present

## 2022-12-24 DIAGNOSIS — J449 Chronic obstructive pulmonary disease, unspecified: Secondary | ICD-10-CM | POA: Diagnosis not present

## 2022-12-24 DIAGNOSIS — I340 Nonrheumatic mitral (valve) insufficiency: Secondary | ICD-10-CM | POA: Diagnosis not present

## 2022-12-24 DIAGNOSIS — J440 Chronic obstructive pulmonary disease with acute lower respiratory infection: Secondary | ICD-10-CM | POA: Diagnosis not present

## 2022-12-24 DIAGNOSIS — I700 Atherosclerosis of aorta: Secondary | ICD-10-CM | POA: Diagnosis not present

## 2022-12-24 DIAGNOSIS — I110 Hypertensive heart disease with heart failure: Secondary | ICD-10-CM | POA: Diagnosis not present

## 2022-12-24 DIAGNOSIS — A419 Sepsis, unspecified organism: Secondary | ICD-10-CM | POA: Diagnosis not present

## 2022-12-24 DIAGNOSIS — J9621 Acute and chronic respiratory failure with hypoxia: Secondary | ICD-10-CM | POA: Diagnosis not present

## 2022-12-24 DIAGNOSIS — R7989 Other specified abnormal findings of blood chemistry: Secondary | ICD-10-CM | POA: Diagnosis not present

## 2022-12-24 DIAGNOSIS — J441 Chronic obstructive pulmonary disease with (acute) exacerbation: Secondary | ICD-10-CM | POA: Diagnosis not present

## 2022-12-24 DIAGNOSIS — F17210 Nicotine dependence, cigarettes, uncomplicated: Secondary | ICD-10-CM | POA: Diagnosis not present

## 2022-12-24 DIAGNOSIS — Z86711 Personal history of pulmonary embolism: Secondary | ICD-10-CM | POA: Diagnosis not present

## 2022-12-24 DIAGNOSIS — R069 Unspecified abnormalities of breathing: Secondary | ICD-10-CM | POA: Diagnosis not present

## 2022-12-24 DIAGNOSIS — J439 Emphysema, unspecified: Secondary | ICD-10-CM | POA: Diagnosis not present

## 2022-12-24 DIAGNOSIS — R918 Other nonspecific abnormal finding of lung field: Secondary | ICD-10-CM | POA: Diagnosis not present

## 2022-12-24 DIAGNOSIS — R0602 Shortness of breath: Secondary | ICD-10-CM | POA: Diagnosis not present

## 2022-12-24 DIAGNOSIS — E1140 Type 2 diabetes mellitus with diabetic neuropathy, unspecified: Secondary | ICD-10-CM | POA: Diagnosis not present

## 2022-12-25 DIAGNOSIS — I4891 Unspecified atrial fibrillation: Secondary | ICD-10-CM

## 2022-12-25 DIAGNOSIS — I340 Nonrheumatic mitral (valve) insufficiency: Secondary | ICD-10-CM

## 2022-12-25 DIAGNOSIS — I351 Nonrheumatic aortic (valve) insufficiency: Secondary | ICD-10-CM

## 2022-12-29 DIAGNOSIS — I5032 Chronic diastolic (congestive) heart failure: Secondary | ICD-10-CM | POA: Diagnosis not present

## 2022-12-29 DIAGNOSIS — J449 Chronic obstructive pulmonary disease, unspecified: Secondary | ICD-10-CM | POA: Diagnosis not present

## 2022-12-29 DIAGNOSIS — R5381 Other malaise: Secondary | ICD-10-CM | POA: Diagnosis not present

## 2022-12-29 DIAGNOSIS — I2699 Other pulmonary embolism without acute cor pulmonale: Secondary | ICD-10-CM | POA: Diagnosis not present

## 2022-12-30 DIAGNOSIS — I2699 Other pulmonary embolism without acute cor pulmonale: Secondary | ICD-10-CM | POA: Diagnosis not present

## 2022-12-30 DIAGNOSIS — J449 Chronic obstructive pulmonary disease, unspecified: Secondary | ICD-10-CM | POA: Diagnosis not present

## 2022-12-30 DIAGNOSIS — R5381 Other malaise: Secondary | ICD-10-CM | POA: Diagnosis not present

## 2022-12-30 DIAGNOSIS — I5032 Chronic diastolic (congestive) heart failure: Secondary | ICD-10-CM | POA: Diagnosis not present

## 2023-01-01 DIAGNOSIS — J9610 Chronic respiratory failure, unspecified whether with hypoxia or hypercapnia: Secondary | ICD-10-CM | POA: Diagnosis not present

## 2023-01-01 DIAGNOSIS — G894 Chronic pain syndrome: Secondary | ICD-10-CM | POA: Diagnosis not present

## 2023-01-02 DIAGNOSIS — Z8673 Personal history of transient ischemic attack (TIA), and cerebral infarction without residual deficits: Secondary | ICD-10-CM | POA: Diagnosis not present

## 2023-01-02 DIAGNOSIS — I499 Cardiac arrhythmia, unspecified: Secondary | ICD-10-CM | POA: Diagnosis not present

## 2023-01-02 DIAGNOSIS — F17210 Nicotine dependence, cigarettes, uncomplicated: Secondary | ICD-10-CM | POA: Diagnosis not present

## 2023-01-02 DIAGNOSIS — J9621 Acute and chronic respiratory failure with hypoxia: Secondary | ICD-10-CM | POA: Diagnosis not present

## 2023-01-02 DIAGNOSIS — E876 Hypokalemia: Secondary | ICD-10-CM | POA: Diagnosis not present

## 2023-01-02 DIAGNOSIS — Z86711 Personal history of pulmonary embolism: Secondary | ICD-10-CM | POA: Diagnosis not present

## 2023-01-02 DIAGNOSIS — I4891 Unspecified atrial fibrillation: Secondary | ICD-10-CM | POA: Diagnosis not present

## 2023-01-02 DIAGNOSIS — J90 Pleural effusion, not elsewhere classified: Secondary | ICD-10-CM | POA: Diagnosis not present

## 2023-01-02 DIAGNOSIS — G928 Other toxic encephalopathy: Secondary | ICD-10-CM | POA: Diagnosis not present

## 2023-01-02 DIAGNOSIS — I480 Paroxysmal atrial fibrillation: Secondary | ICD-10-CM | POA: Diagnosis not present

## 2023-01-02 DIAGNOSIS — R404 Transient alteration of awareness: Secondary | ICD-10-CM | POA: Diagnosis not present

## 2023-01-02 DIAGNOSIS — E785 Hyperlipidemia, unspecified: Secondary | ICD-10-CM | POA: Diagnosis not present

## 2023-01-02 DIAGNOSIS — J690 Pneumonitis due to inhalation of food and vomit: Secondary | ICD-10-CM | POA: Diagnosis not present

## 2023-01-02 DIAGNOSIS — J9622 Acute and chronic respiratory failure with hypercapnia: Secondary | ICD-10-CM | POA: Diagnosis not present

## 2023-01-02 DIAGNOSIS — R0603 Acute respiratory distress: Secondary | ICD-10-CM | POA: Diagnosis not present

## 2023-01-02 DIAGNOSIS — I517 Cardiomegaly: Secondary | ICD-10-CM | POA: Diagnosis not present

## 2023-01-02 DIAGNOSIS — Z886 Allergy status to analgesic agent status: Secondary | ICD-10-CM | POA: Diagnosis not present

## 2023-01-02 DIAGNOSIS — Z743 Need for continuous supervision: Secondary | ICD-10-CM | POA: Diagnosis not present

## 2023-01-02 DIAGNOSIS — Z8711 Personal history of peptic ulcer disease: Secondary | ICD-10-CM | POA: Diagnosis not present

## 2023-01-02 DIAGNOSIS — E119 Type 2 diabetes mellitus without complications: Secondary | ICD-10-CM | POA: Diagnosis not present

## 2023-01-02 DIAGNOSIS — T402X5A Adverse effect of other opioids, initial encounter: Secondary | ICD-10-CM | POA: Diagnosis not present

## 2023-01-02 DIAGNOSIS — J441 Chronic obstructive pulmonary disease with (acute) exacerbation: Secondary | ICD-10-CM | POA: Diagnosis not present

## 2023-01-02 DIAGNOSIS — Z9981 Dependence on supplemental oxygen: Secondary | ICD-10-CM | POA: Diagnosis not present

## 2023-01-02 DIAGNOSIS — D649 Anemia, unspecified: Secondary | ICD-10-CM | POA: Diagnosis not present

## 2023-01-02 DIAGNOSIS — M1990 Unspecified osteoarthritis, unspecified site: Secondary | ICD-10-CM | POA: Diagnosis not present

## 2023-01-02 DIAGNOSIS — R9389 Abnormal findings on diagnostic imaging of other specified body structures: Secondary | ICD-10-CM | POA: Diagnosis not present

## 2023-01-02 DIAGNOSIS — J984 Other disorders of lung: Secondary | ICD-10-CM | POA: Diagnosis not present

## 2023-01-02 DIAGNOSIS — Z87440 Personal history of urinary (tract) infections: Secondary | ICD-10-CM | POA: Diagnosis not present

## 2023-01-02 DIAGNOSIS — I5032 Chronic diastolic (congestive) heart failure: Secondary | ICD-10-CM | POA: Diagnosis not present

## 2023-01-02 DIAGNOSIS — J449 Chronic obstructive pulmonary disease, unspecified: Secondary | ICD-10-CM | POA: Diagnosis not present

## 2023-01-02 DIAGNOSIS — I700 Atherosclerosis of aorta: Secondary | ICD-10-CM | POA: Diagnosis not present

## 2023-01-02 DIAGNOSIS — I509 Heart failure, unspecified: Secondary | ICD-10-CM | POA: Diagnosis not present

## 2023-01-02 DIAGNOSIS — I110 Hypertensive heart disease with heart failure: Secondary | ICD-10-CM | POA: Diagnosis not present

## 2023-01-02 DIAGNOSIS — G8929 Other chronic pain: Secondary | ICD-10-CM | POA: Diagnosis not present

## 2023-01-08 DIAGNOSIS — I4891 Unspecified atrial fibrillation: Secondary | ICD-10-CM | POA: Diagnosis not present

## 2023-01-08 DIAGNOSIS — G894 Chronic pain syndrome: Secondary | ICD-10-CM | POA: Diagnosis not present

## 2023-01-08 DIAGNOSIS — R5381 Other malaise: Secondary | ICD-10-CM | POA: Diagnosis not present

## 2023-01-11 DIAGNOSIS — I5032 Chronic diastolic (congestive) heart failure: Secondary | ICD-10-CM | POA: Diagnosis not present

## 2023-01-11 DIAGNOSIS — I4891 Unspecified atrial fibrillation: Secondary | ICD-10-CM | POA: Diagnosis not present

## 2023-01-11 DIAGNOSIS — J9610 Chronic respiratory failure, unspecified whether with hypoxia or hypercapnia: Secondary | ICD-10-CM | POA: Diagnosis not present

## 2023-01-11 DIAGNOSIS — D649 Anemia, unspecified: Secondary | ICD-10-CM | POA: Diagnosis not present

## 2023-01-11 DIAGNOSIS — J449 Chronic obstructive pulmonary disease, unspecified: Secondary | ICD-10-CM | POA: Diagnosis not present

## 2023-01-12 ENCOUNTER — Encounter: Payer: Self-pay | Admitting: Internal Medicine

## 2023-01-13 DIAGNOSIS — G894 Chronic pain syndrome: Secondary | ICD-10-CM | POA: Diagnosis not present

## 2023-01-13 DIAGNOSIS — J449 Chronic obstructive pulmonary disease, unspecified: Secondary | ICD-10-CM | POA: Diagnosis not present

## 2023-01-14 DIAGNOSIS — I10 Essential (primary) hypertension: Secondary | ICD-10-CM | POA: Diagnosis not present

## 2023-01-14 DIAGNOSIS — J449 Chronic obstructive pulmonary disease, unspecified: Secondary | ICD-10-CM | POA: Diagnosis not present

## 2023-01-14 DIAGNOSIS — G894 Chronic pain syndrome: Secondary | ICD-10-CM | POA: Diagnosis not present

## 2023-01-20 DIAGNOSIS — G4700 Insomnia, unspecified: Secondary | ICD-10-CM | POA: Diagnosis not present

## 2023-01-25 DIAGNOSIS — I5032 Chronic diastolic (congestive) heart failure: Secondary | ICD-10-CM | POA: Diagnosis not present

## 2023-02-01 DIAGNOSIS — I5032 Chronic diastolic (congestive) heart failure: Secondary | ICD-10-CM | POA: Diagnosis not present

## 2023-02-01 DIAGNOSIS — G894 Chronic pain syndrome: Secondary | ICD-10-CM | POA: Diagnosis not present

## 2023-02-10 DIAGNOSIS — I739 Peripheral vascular disease, unspecified: Secondary | ICD-10-CM | POA: Diagnosis not present

## 2023-02-10 DIAGNOSIS — L84 Corns and callosities: Secondary | ICD-10-CM | POA: Diagnosis not present

## 2023-02-10 DIAGNOSIS — L603 Nail dystrophy: Secondary | ICD-10-CM | POA: Diagnosis not present

## 2023-02-10 DIAGNOSIS — L602 Onychogryphosis: Secondary | ICD-10-CM | POA: Diagnosis not present

## 2023-02-17 DIAGNOSIS — M2010 Hallux valgus (acquired), unspecified foot: Secondary | ICD-10-CM | POA: Diagnosis not present

## 2023-02-17 DIAGNOSIS — G4700 Insomnia, unspecified: Secondary | ICD-10-CM | POA: Diagnosis not present

## 2023-02-18 DIAGNOSIS — I2699 Other pulmonary embolism without acute cor pulmonale: Secondary | ICD-10-CM | POA: Diagnosis not present

## 2023-02-18 DIAGNOSIS — J449 Chronic obstructive pulmonary disease, unspecified: Secondary | ICD-10-CM | POA: Diagnosis not present

## 2023-02-18 DIAGNOSIS — I4891 Unspecified atrial fibrillation: Secondary | ICD-10-CM | POA: Diagnosis not present

## 2023-02-22 DIAGNOSIS — G9340 Encephalopathy, unspecified: Secondary | ICD-10-CM | POA: Diagnosis not present

## 2023-02-22 DIAGNOSIS — R0602 Shortness of breath: Secondary | ICD-10-CM | POA: Diagnosis not present

## 2023-02-23 DIAGNOSIS — J440 Chronic obstructive pulmonary disease with acute lower respiratory infection: Secondary | ICD-10-CM | POA: Diagnosis not present

## 2023-02-25 DIAGNOSIS — G894 Chronic pain syndrome: Secondary | ICD-10-CM | POA: Diagnosis not present

## 2023-02-25 DIAGNOSIS — G4700 Insomnia, unspecified: Secondary | ICD-10-CM | POA: Diagnosis not present

## 2023-03-01 DIAGNOSIS — F17210 Nicotine dependence, cigarettes, uncomplicated: Secondary | ICD-10-CM | POA: Diagnosis not present

## 2023-03-01 DIAGNOSIS — R251 Tremor, unspecified: Secondary | ICD-10-CM | POA: Diagnosis not present

## 2023-03-03 DIAGNOSIS — R251 Tremor, unspecified: Secondary | ICD-10-CM | POA: Diagnosis not present

## 2023-03-03 DIAGNOSIS — J9610 Chronic respiratory failure, unspecified whether with hypoxia or hypercapnia: Secondary | ICD-10-CM | POA: Diagnosis not present

## 2023-03-03 DIAGNOSIS — I5032 Chronic diastolic (congestive) heart failure: Secondary | ICD-10-CM | POA: Diagnosis not present

## 2023-03-04 DIAGNOSIS — R059 Cough, unspecified: Secondary | ICD-10-CM | POA: Diagnosis not present

## 2023-03-04 DIAGNOSIS — J449 Chronic obstructive pulmonary disease, unspecified: Secondary | ICD-10-CM | POA: Diagnosis not present

## 2023-03-11 DIAGNOSIS — M6259 Muscle wasting and atrophy, not elsewhere classified, multiple sites: Secondary | ICD-10-CM | POA: Diagnosis not present

## 2023-03-11 DIAGNOSIS — R251 Tremor, unspecified: Secondary | ICD-10-CM | POA: Diagnosis not present

## 2023-03-11 DIAGNOSIS — J449 Chronic obstructive pulmonary disease, unspecified: Secondary | ICD-10-CM | POA: Diagnosis not present

## 2023-03-12 DIAGNOSIS — M6259 Muscle wasting and atrophy, not elsewhere classified, multiple sites: Secondary | ICD-10-CM | POA: Diagnosis not present

## 2023-03-15 DIAGNOSIS — M6259 Muscle wasting and atrophy, not elsewhere classified, multiple sites: Secondary | ICD-10-CM | POA: Diagnosis not present

## 2023-03-16 DIAGNOSIS — J449 Chronic obstructive pulmonary disease, unspecified: Secondary | ICD-10-CM | POA: Diagnosis not present

## 2023-03-16 DIAGNOSIS — M6259 Muscle wasting and atrophy, not elsewhere classified, multiple sites: Secondary | ICD-10-CM | POA: Diagnosis not present

## 2023-03-16 DIAGNOSIS — R053 Chronic cough: Secondary | ICD-10-CM | POA: Diagnosis not present

## 2023-03-26 DIAGNOSIS — I4891 Unspecified atrial fibrillation: Secondary | ICD-10-CM | POA: Diagnosis not present

## 2023-03-26 DIAGNOSIS — J9690 Respiratory failure, unspecified, unspecified whether with hypoxia or hypercapnia: Secondary | ICD-10-CM | POA: Diagnosis not present

## 2023-03-26 DIAGNOSIS — J189 Pneumonia, unspecified organism: Secondary | ICD-10-CM | POA: Diagnosis not present

## 2023-03-26 DIAGNOSIS — Z66 Do not resuscitate: Secondary | ICD-10-CM | POA: Diagnosis not present

## 2023-03-26 DIAGNOSIS — I471 Supraventricular tachycardia, unspecified: Secondary | ICD-10-CM | POA: Diagnosis not present

## 2023-04-17 DEATH — deceased
